# Patient Record
Sex: Female | Born: 1980 | Race: White | Hispanic: No | Marital: Single | Smoking: Current every day smoker
Health system: Southern US, Community
[De-identification: ages and names within clinical notes are randomized; demographics above are authoritative.]

## PROBLEM LIST (undated history)

## (undated) DIAGNOSIS — D649 Anemia, unspecified: Secondary | ICD-10-CM

## (undated) DIAGNOSIS — R569 Unspecified convulsions: Secondary | ICD-10-CM

## (undated) DIAGNOSIS — I69359 Hemiplegia and hemiparesis following cerebral infarction affecting unspecified side: Secondary | ICD-10-CM

## (undated) DIAGNOSIS — T8859XA Other complications of anesthesia, initial encounter: Secondary | ICD-10-CM

## (undated) DIAGNOSIS — N200 Calculus of kidney: Secondary | ICD-10-CM

## (undated) DIAGNOSIS — G43909 Migraine, unspecified, not intractable, without status migrainosus: Secondary | ICD-10-CM

## (undated) DIAGNOSIS — M1990 Unspecified osteoarthritis, unspecified site: Secondary | ICD-10-CM

## (undated) DIAGNOSIS — I514 Myocarditis, unspecified: Secondary | ICD-10-CM

## (undated) DIAGNOSIS — N760 Acute vaginitis: Secondary | ICD-10-CM

## (undated) DIAGNOSIS — B9689 Other specified bacterial agents as the cause of diseases classified elsewhere: Secondary | ICD-10-CM

## (undated) DIAGNOSIS — Z8489 Family history of other specified conditions: Secondary | ICD-10-CM

## (undated) DIAGNOSIS — Z9289 Personal history of other medical treatment: Secondary | ICD-10-CM

## (undated) DIAGNOSIS — F419 Anxiety disorder, unspecified: Secondary | ICD-10-CM

## (undated) DIAGNOSIS — M5430 Sciatica, unspecified side: Secondary | ICD-10-CM

## (undated) DIAGNOSIS — I69398 Other sequelae of cerebral infarction: Secondary | ICD-10-CM

## (undated) DIAGNOSIS — Z8759 Personal history of other complications of pregnancy, childbirth and the puerperium: Secondary | ICD-10-CM

## (undated) DIAGNOSIS — N189 Chronic kidney disease, unspecified: Secondary | ICD-10-CM

## (undated) DIAGNOSIS — T4145XA Adverse effect of unspecified anesthetic, initial encounter: Secondary | ICD-10-CM

## (undated) DIAGNOSIS — A749 Chlamydial infection, unspecified: Secondary | ICD-10-CM

## (undated) DIAGNOSIS — N390 Urinary tract infection, site not specified: Secondary | ICD-10-CM

## (undated) HISTORY — DX: Personal history of other medical treatment: Z92.89

## (undated) HISTORY — PX: OOPHORECTOMY: SHX86

## (undated) HISTORY — PX: SALPINGOOPHORECTOMY: SHX82

## (undated) HISTORY — DX: Unspecified convulsions: R56.9

## (undated) HISTORY — DX: Hemiplegia and hemiparesis following cerebral infarction affecting unspecified side: I69.359

## (undated) HISTORY — DX: Other sequelae of cerebral infarction: I69.398

## (undated) HISTORY — PX: APPENDECTOMY: SHX54

---

## 2005-02-09 ENCOUNTER — Emergency Department (HOSPITAL_COMMUNITY): Admission: EM | Admit: 2005-02-09 | Discharge: 2005-02-09 | Payer: Self-pay

## 2005-07-08 ENCOUNTER — Emergency Department (HOSPITAL_COMMUNITY): Admission: EM | Admit: 2005-07-08 | Discharge: 2005-07-08 | Payer: Self-pay | Admitting: Emergency Medicine

## 2005-07-14 ENCOUNTER — Emergency Department (HOSPITAL_COMMUNITY): Admission: EM | Admit: 2005-07-14 | Discharge: 2005-07-14 | Payer: Self-pay | Admitting: Emergency Medicine

## 2005-09-10 ENCOUNTER — Emergency Department (HOSPITAL_COMMUNITY): Admission: EM | Admit: 2005-09-10 | Discharge: 2005-09-10 | Payer: Self-pay | Admitting: Emergency Medicine

## 2006-01-27 ENCOUNTER — Emergency Department (HOSPITAL_COMMUNITY): Admission: EM | Admit: 2006-01-27 | Discharge: 2006-01-27 | Payer: Self-pay | Admitting: Emergency Medicine

## 2006-02-01 ENCOUNTER — Emergency Department (HOSPITAL_COMMUNITY): Admission: EM | Admit: 2006-02-01 | Discharge: 2006-02-01 | Payer: Self-pay | Admitting: Emergency Medicine

## 2006-03-07 ENCOUNTER — Emergency Department (HOSPITAL_COMMUNITY): Admission: EM | Admit: 2006-03-07 | Discharge: 2006-03-07 | Payer: Self-pay | Admitting: Emergency Medicine

## 2006-03-29 ENCOUNTER — Emergency Department (HOSPITAL_COMMUNITY): Admission: EM | Admit: 2006-03-29 | Discharge: 2006-03-29 | Payer: Self-pay | Admitting: Emergency Medicine

## 2006-04-01 ENCOUNTER — Ambulatory Visit (HOSPITAL_COMMUNITY): Admission: RE | Admit: 2006-04-01 | Discharge: 2006-04-01 | Payer: Self-pay | Admitting: Emergency Medicine

## 2006-04-25 ENCOUNTER — Emergency Department (HOSPITAL_COMMUNITY): Admission: EM | Admit: 2006-04-25 | Discharge: 2006-04-25 | Payer: Self-pay | Admitting: Emergency Medicine

## 2006-05-14 ENCOUNTER — Emergency Department (HOSPITAL_COMMUNITY): Admission: EM | Admit: 2006-05-14 | Discharge: 2006-05-14 | Payer: Self-pay | Admitting: Emergency Medicine

## 2006-05-17 ENCOUNTER — Inpatient Hospital Stay (HOSPITAL_COMMUNITY): Admission: AD | Admit: 2006-05-17 | Discharge: 2006-05-17 | Payer: Self-pay | Admitting: Obstetrics & Gynecology

## 2006-05-29 ENCOUNTER — Emergency Department (HOSPITAL_COMMUNITY): Admission: EM | Admit: 2006-05-29 | Discharge: 2006-05-29 | Payer: Self-pay | Admitting: Emergency Medicine

## 2006-06-09 ENCOUNTER — Emergency Department (HOSPITAL_COMMUNITY): Admission: EM | Admit: 2006-06-09 | Discharge: 2006-06-09 | Payer: Self-pay | Admitting: Emergency Medicine

## 2006-06-13 ENCOUNTER — Ambulatory Visit: Payer: Self-pay | Admitting: Gynecology

## 2006-06-14 ENCOUNTER — Emergency Department (HOSPITAL_COMMUNITY): Admission: EM | Admit: 2006-06-14 | Discharge: 2006-06-14 | Payer: Self-pay | Admitting: Emergency Medicine

## 2006-06-29 ENCOUNTER — Emergency Department (HOSPITAL_COMMUNITY): Admission: EM | Admit: 2006-06-29 | Discharge: 2006-06-29 | Payer: Self-pay | Admitting: Emergency Medicine

## 2006-07-23 ENCOUNTER — Emergency Department (HOSPITAL_COMMUNITY): Admission: EM | Admit: 2006-07-23 | Discharge: 2006-07-23 | Payer: Self-pay | Admitting: Emergency Medicine

## 2006-07-27 ENCOUNTER — Emergency Department (HOSPITAL_COMMUNITY): Admission: EM | Admit: 2006-07-27 | Discharge: 2006-07-27 | Payer: Self-pay | Admitting: Emergency Medicine

## 2006-08-01 ENCOUNTER — Ambulatory Visit: Payer: Self-pay | Admitting: Gynecology

## 2006-08-01 ENCOUNTER — Ambulatory Visit (HOSPITAL_COMMUNITY): Admission: RE | Admit: 2006-08-01 | Discharge: 2006-08-01 | Payer: Self-pay | Admitting: Obstetrics and Gynecology

## 2006-08-07 ENCOUNTER — Inpatient Hospital Stay (HOSPITAL_COMMUNITY): Admission: AD | Admit: 2006-08-07 | Discharge: 2006-08-07 | Payer: Self-pay | Admitting: Gynecology

## 2006-08-09 ENCOUNTER — Emergency Department (HOSPITAL_COMMUNITY): Admission: EM | Admit: 2006-08-09 | Discharge: 2006-08-09 | Payer: Self-pay | Admitting: Emergency Medicine

## 2006-08-14 ENCOUNTER — Inpatient Hospital Stay (HOSPITAL_COMMUNITY): Admission: AD | Admit: 2006-08-14 | Discharge: 2006-08-14 | Payer: Self-pay | Admitting: Obstetrics and Gynecology

## 2006-08-16 ENCOUNTER — Ambulatory Visit: Payer: Self-pay | Admitting: Gynecology

## 2006-08-16 ENCOUNTER — Encounter (INDEPENDENT_AMBULATORY_CARE_PROVIDER_SITE_OTHER): Payer: Self-pay | Admitting: *Deleted

## 2006-08-16 ENCOUNTER — Ambulatory Visit (HOSPITAL_COMMUNITY): Admission: RE | Admit: 2006-08-16 | Discharge: 2006-08-17 | Payer: Self-pay | Admitting: Gynecology

## 2006-08-23 ENCOUNTER — Inpatient Hospital Stay (HOSPITAL_COMMUNITY): Admission: AD | Admit: 2006-08-23 | Discharge: 2006-08-23 | Payer: Self-pay | Admitting: Gynecology

## 2006-08-28 ENCOUNTER — Ambulatory Visit: Payer: Self-pay | Admitting: Gynecology

## 2006-09-06 ENCOUNTER — Emergency Department (HOSPITAL_COMMUNITY): Admission: EM | Admit: 2006-09-06 | Discharge: 2006-09-06 | Payer: Self-pay | Admitting: Emergency Medicine

## 2006-09-20 ENCOUNTER — Inpatient Hospital Stay (HOSPITAL_COMMUNITY): Admission: AD | Admit: 2006-09-20 | Discharge: 2006-09-20 | Payer: Self-pay | Admitting: Gynecology

## 2006-09-25 ENCOUNTER — Ambulatory Visit: Payer: Self-pay | Admitting: Gynecology

## 2006-10-15 ENCOUNTER — Inpatient Hospital Stay (HOSPITAL_COMMUNITY): Admission: AD | Admit: 2006-10-15 | Discharge: 2006-10-15 | Payer: Self-pay | Admitting: Obstetrics and Gynecology

## 2006-10-28 ENCOUNTER — Ambulatory Visit (HOSPITAL_COMMUNITY): Admission: RE | Admit: 2006-10-28 | Discharge: 2006-10-28 | Payer: Self-pay | Admitting: Gynecology

## 2006-11-19 ENCOUNTER — Ambulatory Visit: Payer: Self-pay | Admitting: Nurse Practitioner

## 2006-11-22 ENCOUNTER — Ambulatory Visit: Payer: Self-pay | Admitting: *Deleted

## 2006-12-09 ENCOUNTER — Emergency Department (HOSPITAL_COMMUNITY): Admission: EM | Admit: 2006-12-09 | Discharge: 2006-12-09 | Payer: Self-pay | Admitting: Emergency Medicine

## 2006-12-18 ENCOUNTER — Ambulatory Visit: Payer: Self-pay | Admitting: Gynecology

## 2006-12-26 ENCOUNTER — Ambulatory Visit: Payer: Self-pay | Admitting: Family Medicine

## 2006-12-30 ENCOUNTER — Emergency Department (HOSPITAL_COMMUNITY): Admission: EM | Admit: 2006-12-30 | Discharge: 2006-12-30 | Payer: Self-pay | Admitting: Emergency Medicine

## 2007-01-22 ENCOUNTER — Ambulatory Visit: Payer: Self-pay | Admitting: Internal Medicine

## 2007-01-27 ENCOUNTER — Emergency Department (HOSPITAL_COMMUNITY): Admission: EM | Admit: 2007-01-27 | Discharge: 2007-01-27 | Payer: Self-pay | Admitting: Emergency Medicine

## 2007-02-12 ENCOUNTER — Ambulatory Visit: Payer: Self-pay | Admitting: Internal Medicine

## 2007-03-06 ENCOUNTER — Ambulatory Visit: Payer: Self-pay | Admitting: Family Medicine

## 2007-03-16 ENCOUNTER — Emergency Department (HOSPITAL_COMMUNITY): Admission: EM | Admit: 2007-03-16 | Discharge: 2007-03-16 | Payer: Self-pay | Admitting: Emergency Medicine

## 2007-03-24 ENCOUNTER — Emergency Department (HOSPITAL_COMMUNITY): Admission: EM | Admit: 2007-03-24 | Discharge: 2007-03-24 | Payer: Self-pay | Admitting: Emergency Medicine

## 2007-04-01 ENCOUNTER — Ambulatory Visit: Payer: Self-pay | Admitting: Internal Medicine

## 2007-04-13 ENCOUNTER — Inpatient Hospital Stay (HOSPITAL_COMMUNITY): Admission: AD | Admit: 2007-04-13 | Discharge: 2007-04-13 | Payer: Self-pay | Admitting: Obstetrics & Gynecology

## 2007-04-23 ENCOUNTER — Ambulatory Visit: Payer: Self-pay | Admitting: Internal Medicine

## 2007-04-23 ENCOUNTER — Ambulatory Visit: Payer: Self-pay | Admitting: Gynecology

## 2007-05-14 ENCOUNTER — Encounter: Payer: Self-pay | Admitting: Obstetrics and Gynecology

## 2007-05-14 ENCOUNTER — Ambulatory Visit: Payer: Self-pay | Admitting: Obstetrics & Gynecology

## 2007-05-23 ENCOUNTER — Emergency Department (HOSPITAL_COMMUNITY): Admission: EM | Admit: 2007-05-23 | Discharge: 2007-05-23 | Payer: Self-pay | Admitting: Emergency Medicine

## 2007-05-27 ENCOUNTER — Ambulatory Visit (HOSPITAL_COMMUNITY): Admission: RE | Admit: 2007-05-27 | Discharge: 2007-05-27 | Payer: Self-pay | Admitting: Obstetrics & Gynecology

## 2007-06-07 ENCOUNTER — Emergency Department (HOSPITAL_COMMUNITY): Admission: EM | Admit: 2007-06-07 | Discharge: 2007-06-07 | Payer: Self-pay | Admitting: Emergency Medicine

## 2007-06-11 ENCOUNTER — Ambulatory Visit: Payer: Self-pay | Admitting: *Deleted

## 2007-06-26 ENCOUNTER — Emergency Department (HOSPITAL_COMMUNITY): Admission: EM | Admit: 2007-06-26 | Discharge: 2007-06-26 | Payer: Self-pay | Admitting: Emergency Medicine

## 2007-06-30 ENCOUNTER — Ambulatory Visit: Payer: Self-pay | Admitting: Obstetrics & Gynecology

## 2007-07-11 ENCOUNTER — Ambulatory Visit (HOSPITAL_COMMUNITY): Admission: RE | Admit: 2007-07-11 | Discharge: 2007-07-11 | Payer: Self-pay | Admitting: Obstetrics & Gynecology

## 2007-07-24 ENCOUNTER — Ambulatory Visit: Payer: Self-pay | Admitting: Obstetrics & Gynecology

## 2007-08-05 ENCOUNTER — Encounter: Admission: RE | Admit: 2007-08-05 | Discharge: 2007-09-16 | Payer: Self-pay | Admitting: Obstetrics & Gynecology

## 2007-08-07 ENCOUNTER — Emergency Department (HOSPITAL_COMMUNITY): Admission: EM | Admit: 2007-08-07 | Discharge: 2007-08-07 | Payer: Self-pay | Admitting: Emergency Medicine

## 2007-08-08 ENCOUNTER — Ambulatory Visit (HOSPITAL_COMMUNITY): Admission: RE | Admit: 2007-08-08 | Discharge: 2007-08-08 | Payer: Self-pay | Admitting: Obstetrics & Gynecology

## 2007-08-15 ENCOUNTER — Emergency Department (HOSPITAL_COMMUNITY): Admission: EM | Admit: 2007-08-15 | Discharge: 2007-08-15 | Payer: Self-pay | Admitting: Emergency Medicine

## 2007-08-26 ENCOUNTER — Emergency Department (HOSPITAL_COMMUNITY): Admission: EM | Admit: 2007-08-26 | Discharge: 2007-08-26 | Payer: Self-pay | Admitting: Emergency Medicine

## 2007-08-28 ENCOUNTER — Ambulatory Visit: Payer: Self-pay | Admitting: Obstetrics & Gynecology

## 2007-09-05 ENCOUNTER — Ambulatory Visit (HOSPITAL_COMMUNITY): Admission: RE | Admit: 2007-09-05 | Discharge: 2007-09-05 | Payer: Self-pay | Admitting: Obstetrics & Gynecology

## 2007-09-25 ENCOUNTER — Ambulatory Visit: Payer: Self-pay | Admitting: Family Medicine

## 2007-10-09 ENCOUNTER — Ambulatory Visit: Payer: Self-pay | Admitting: Obstetrics & Gynecology

## 2007-10-23 ENCOUNTER — Ambulatory Visit: Payer: Self-pay | Admitting: Obstetrics & Gynecology

## 2007-10-23 ENCOUNTER — Encounter: Payer: Self-pay | Admitting: Obstetrics and Gynecology

## 2007-10-23 ENCOUNTER — Inpatient Hospital Stay (HOSPITAL_COMMUNITY): Admission: AD | Admit: 2007-10-23 | Discharge: 2007-10-25 | Payer: Self-pay | Admitting: Obstetrics and Gynecology

## 2007-10-23 ENCOUNTER — Ambulatory Visit: Payer: Self-pay | Admitting: Family

## 2007-10-27 ENCOUNTER — Inpatient Hospital Stay (HOSPITAL_COMMUNITY): Admission: AD | Admit: 2007-10-27 | Discharge: 2007-10-27 | Payer: Self-pay | Admitting: Obstetrics & Gynecology

## 2007-10-30 ENCOUNTER — Emergency Department (HOSPITAL_COMMUNITY): Admission: EM | Admit: 2007-10-30 | Discharge: 2007-10-30 | Payer: Self-pay | Admitting: Emergency Medicine

## 2007-10-31 ENCOUNTER — Ambulatory Visit: Payer: Self-pay | Admitting: *Deleted

## 2007-11-07 ENCOUNTER — Ambulatory Visit: Payer: Self-pay | Admitting: Gynecology

## 2007-12-06 ENCOUNTER — Emergency Department (HOSPITAL_COMMUNITY): Admission: EM | Admit: 2007-12-06 | Discharge: 2007-12-06 | Payer: Self-pay | Admitting: Emergency Medicine

## 2007-12-19 ENCOUNTER — Ambulatory Visit: Payer: Self-pay | Admitting: Internal Medicine

## 2008-01-04 ENCOUNTER — Emergency Department (HOSPITAL_COMMUNITY): Admission: EM | Admit: 2008-01-04 | Discharge: 2008-01-04 | Payer: Self-pay | Admitting: Emergency Medicine

## 2008-01-15 ENCOUNTER — Ambulatory Visit: Payer: Self-pay | Admitting: Internal Medicine

## 2008-02-16 ENCOUNTER — Encounter: Admission: RE | Admit: 2008-02-16 | Discharge: 2008-04-23 | Payer: Self-pay | Admitting: Family Medicine

## 2008-03-03 ENCOUNTER — Emergency Department (HOSPITAL_COMMUNITY): Admission: EM | Admit: 2008-03-03 | Discharge: 2008-03-03 | Payer: Self-pay | Admitting: Emergency Medicine

## 2008-03-22 ENCOUNTER — Ambulatory Visit: Payer: Self-pay | Admitting: Internal Medicine

## 2008-03-22 ENCOUNTER — Encounter (INDEPENDENT_AMBULATORY_CARE_PROVIDER_SITE_OTHER): Payer: Self-pay | Admitting: Family Medicine

## 2008-03-22 LAB — CONVERTED CEMR LAB
ALT: 9 units/L (ref 0–35)
AST: 11 units/L (ref 0–37)
Albumin: 4.7 g/dL (ref 3.5–5.2)
CO2: 25 meq/L (ref 19–32)
Calcium: 9.8 mg/dL (ref 8.4–10.5)
Chloride: 106 meq/L (ref 96–112)
Creatinine, Ser: 0.78 mg/dL (ref 0.40–1.20)
Potassium: 4.4 meq/L (ref 3.5–5.3)
Preg, Serum: NEGATIVE
Total Protein: 7.1 g/dL (ref 6.0–8.3)

## 2008-04-27 ENCOUNTER — Ambulatory Visit: Payer: Self-pay | Admitting: Internal Medicine

## 2008-06-02 ENCOUNTER — Ambulatory Visit: Payer: Self-pay | Admitting: Obstetrics & Gynecology

## 2008-06-02 ENCOUNTER — Encounter: Payer: Self-pay | Admitting: Obstetrics & Gynecology

## 2008-09-20 ENCOUNTER — Emergency Department (HOSPITAL_COMMUNITY): Admission: EM | Admit: 2008-09-20 | Discharge: 2008-09-20 | Payer: Self-pay | Admitting: Emergency Medicine

## 2008-12-24 ENCOUNTER — Emergency Department (HOSPITAL_COMMUNITY): Admission: EM | Admit: 2008-12-24 | Discharge: 2008-12-24 | Payer: Self-pay | Admitting: Emergency Medicine

## 2009-01-27 ENCOUNTER — Ambulatory Visit: Payer: Self-pay | Admitting: Obstetrics and Gynecology

## 2009-01-27 ENCOUNTER — Encounter: Payer: Self-pay | Admitting: Obstetrics & Gynecology

## 2009-07-25 ENCOUNTER — Emergency Department (HOSPITAL_COMMUNITY): Admission: EM | Admit: 2009-07-25 | Discharge: 2009-07-25 | Payer: Self-pay | Admitting: Emergency Medicine

## 2009-07-27 ENCOUNTER — Ambulatory Visit: Payer: Self-pay | Admitting: Obstetrics and Gynecology

## 2009-07-27 ENCOUNTER — Other Ambulatory Visit: Admission: RE | Admit: 2009-07-27 | Discharge: 2009-07-27 | Payer: Self-pay | Admitting: Psychology

## 2009-08-10 ENCOUNTER — Ambulatory Visit: Payer: Self-pay | Admitting: Obstetrics & Gynecology

## 2009-08-18 ENCOUNTER — Emergency Department (HOSPITAL_COMMUNITY): Admission: EM | Admit: 2009-08-18 | Discharge: 2009-08-18 | Payer: Self-pay | Admitting: Emergency Medicine

## 2009-09-19 ENCOUNTER — Ambulatory Visit: Payer: Self-pay | Admitting: Internal Medicine

## 2009-11-17 ENCOUNTER — Ambulatory Visit: Payer: Self-pay | Admitting: Internal Medicine

## 2009-12-03 ENCOUNTER — Emergency Department (HOSPITAL_COMMUNITY): Admission: EM | Admit: 2009-12-03 | Discharge: 2009-12-03 | Payer: Self-pay | Admitting: Emergency Medicine

## 2009-12-15 ENCOUNTER — Ambulatory Visit: Payer: Self-pay | Admitting: Internal Medicine

## 2010-01-18 ENCOUNTER — Ambulatory Visit: Payer: Self-pay | Admitting: Internal Medicine

## 2010-01-23 ENCOUNTER — Emergency Department (HOSPITAL_COMMUNITY): Admission: EM | Admit: 2010-01-23 | Discharge: 2010-01-23 | Payer: Self-pay | Admitting: Emergency Medicine

## 2010-01-25 ENCOUNTER — Ambulatory Visit: Payer: Self-pay | Admitting: Internal Medicine

## 2010-02-08 ENCOUNTER — Emergency Department (HOSPITAL_COMMUNITY): Admission: EM | Admit: 2010-02-08 | Discharge: 2010-02-08 | Payer: Self-pay | Admitting: Emergency Medicine

## 2010-02-22 ENCOUNTER — Ambulatory Visit: Payer: Self-pay | Admitting: Internal Medicine

## 2010-07-30 ENCOUNTER — Encounter: Payer: Self-pay | Admitting: *Deleted

## 2010-07-31 ENCOUNTER — Encounter: Payer: Self-pay | Admitting: Emergency Medicine

## 2010-09-27 LAB — POCT I-STAT, CHEM 8
BUN: 11 mg/dL (ref 6–23)
Creatinine, Ser: 0.8 mg/dL (ref 0.4–1.2)
Hemoglobin: 13.6 g/dL (ref 12.0–15.0)
Potassium: 3.9 mEq/L (ref 3.5–5.1)
Sodium: 140 mEq/L (ref 135–145)
TCO2: 27 mmol/L (ref 0–100)

## 2010-09-27 LAB — URINALYSIS, ROUTINE W REFLEX MICROSCOPIC
Glucose, UA: NEGATIVE mg/dL
Hgb urine dipstick: NEGATIVE
Leukocytes, UA: NEGATIVE
Protein, ur: NEGATIVE mg/dL
Specific Gravity, Urine: 1.022 (ref 1.005–1.030)
pH: 6.5 (ref 5.0–8.0)

## 2010-09-27 LAB — URINE MICROSCOPIC-ADD ON

## 2010-09-27 LAB — POCT PREGNANCY, URINE

## 2010-09-27 LAB — WET PREP, GENITAL
WBC, Wet Prep HPF POC: NONE SEEN
Yeast Wet Prep HPF POC: NONE SEEN

## 2010-10-15 LAB — POCT URINALYSIS DIP (DEVICE)
Bilirubin Urine: NEGATIVE
Glucose, UA: NEGATIVE mg/dL
Nitrite: NEGATIVE
Specific Gravity, Urine: 1.02 (ref 1.005–1.030)
Urobilinogen, UA: 0.2 mg/dL (ref 0.0–1.0)

## 2010-10-16 ENCOUNTER — Emergency Department (HOSPITAL_COMMUNITY)
Admission: EM | Admit: 2010-10-16 | Discharge: 2010-10-16 | Disposition: A | Discharge status: Home / Self Care | Payer: Medicaid Other | Attending: Emergency Medicine | Admitting: Emergency Medicine

## 2010-10-16 DIAGNOSIS — R10816 Epigastric abdominal tenderness: Secondary | ICD-10-CM | POA: Insufficient documentation

## 2010-10-16 DIAGNOSIS — N390 Urinary tract infection, site not specified: Secondary | ICD-10-CM | POA: Insufficient documentation

## 2010-10-16 DIAGNOSIS — R109 Unspecified abdominal pain: Secondary | ICD-10-CM | POA: Insufficient documentation

## 2010-10-16 DIAGNOSIS — R112 Nausea with vomiting, unspecified: Secondary | ICD-10-CM | POA: Insufficient documentation

## 2010-10-16 LAB — URINALYSIS, ROUTINE W REFLEX MICROSCOPIC
Ketones, ur: >80 mg/dL — AB
Nitrite: NEGATIVE
Specific Gravity, Urine: 1.038 — ABNORMAL HIGH (ref 1.005–1.030)
pH: 6 (ref 5.0–8.0)

## 2010-10-16 LAB — COMPREHENSIVE METABOLIC PANEL
Alkaline Phosphatase: 55 U/L (ref 39–117)
BUN: 12 mg/dL (ref 6–23)
Creatinine, Ser: 0.76 mg/dL (ref 0.4–1.2)
Glucose, Bld: 80 mg/dL (ref 70–99)
Potassium: 3.2 mEq/L — ABNORMAL LOW (ref 3.5–5.1)
Total Protein: 6.9 g/dL (ref 6.0–8.3)

## 2010-10-16 LAB — URINE MICROSCOPIC-ADD ON

## 2010-10-16 LAB — CBC
HCT: 39.5 % (ref 36.0–46.0)
Hemoglobin: 12.8 g/dL (ref 12.0–15.0)
RBC: 4.23 MIL/uL (ref 3.87–5.11)

## 2010-10-16 LAB — DIFFERENTIAL
Basophils Absolute: 0 10*3/uL (ref 0.0–0.1)
Basophils Relative: 0 % (ref 0–1)
Lymphocytes Relative: 37 % (ref 12–46)
Neutro Abs: 4.2 10*3/uL (ref 1.7–7.7)
Neutrophils Relative %: 52 % (ref 43–77)

## 2010-10-16 LAB — POCT PREGNANCY, URINE

## 2010-10-16 LAB — LIPASE, BLOOD: Lipase: 33 U/L (ref 11–59)

## 2010-10-19 ENCOUNTER — Emergency Department (HOSPITAL_COMMUNITY): Payer: Medicaid Other

## 2010-10-19 ENCOUNTER — Emergency Department (HOSPITAL_COMMUNITY)
Admission: EM | Admit: 2010-10-19 | Discharge: 2010-10-19 | Disposition: A | Discharge status: Home / Self Care | Payer: Medicaid Other | Attending: Emergency Medicine | Admitting: Emergency Medicine

## 2010-10-19 DIAGNOSIS — IMO0002 Reserved for concepts with insufficient information to code with codable children: Secondary | ICD-10-CM | POA: Insufficient documentation

## 2010-10-19 DIAGNOSIS — R1013 Epigastric pain: Secondary | ICD-10-CM | POA: Insufficient documentation

## 2010-10-19 DIAGNOSIS — R112 Nausea with vomiting, unspecified: Secondary | ICD-10-CM | POA: Insufficient documentation

## 2010-10-19 DIAGNOSIS — R197 Diarrhea, unspecified: Secondary | ICD-10-CM | POA: Insufficient documentation

## 2010-10-19 DIAGNOSIS — Z8742 Personal history of other diseases of the female genital tract: Secondary | ICD-10-CM | POA: Insufficient documentation

## 2010-10-19 DIAGNOSIS — M1990 Unspecified osteoarthritis, unspecified site: Secondary | ICD-10-CM | POA: Insufficient documentation

## 2010-10-19 LAB — COMPREHENSIVE METABOLIC PANEL WITH GFR
ALT: 12 U/L (ref 0–35)
AST: 12 U/L (ref 0–37)
Alkaline Phosphatase: 56 U/L (ref 39–117)
CO2: 26 meq/L (ref 19–32)
Calcium: 9.5 mg/dL (ref 8.4–10.5)
GFR calc Af Amer: >60 mL/min (ref >60)
GFR calc non Af Amer: >60 mL/min (ref >60)
Glucose, Bld: 89 mg/dL (ref 70–99)
Potassium: 3 meq/L — ABNORMAL LOW (ref 3.5–5.1)
Sodium: 140 meq/L (ref 135–145)

## 2010-10-19 LAB — COMPREHENSIVE METABOLIC PANEL
Albumin: 4.1 g/dL (ref 3.5–5.2)
BUN: 6 mg/dL (ref 6–23)
Chloride: 105 mEq/L (ref 96–112)
Creatinine, Ser: 0.72 mg/dL (ref 0.4–1.2)
Total Bilirubin: 0.7 mg/dL (ref 0.3–1.2)
Total Protein: 6.9 g/dL (ref 6.0–8.3)

## 2010-10-19 LAB — URINALYSIS, ROUTINE W REFLEX MICROSCOPIC
Bilirubin Urine: NEGATIVE
Glucose, UA: NEGATIVE mg/dL
Ketones, ur: 40 mg/dL — AB
Nitrite: NEGATIVE
Protein, ur: NEGATIVE mg/dL
Specific Gravity, Urine: 1.022 (ref 1.005–1.030)
Urobilinogen, UA: 0.2 mg/dL (ref 0.0–1.0)
pH: 6.5 (ref 5.0–8.0)

## 2010-10-19 LAB — URINE MICROSCOPIC-ADD ON

## 2010-10-19 LAB — DIFFERENTIAL
Basophils Absolute: 0 10*3/uL (ref 0.0–0.1)
Basophils Relative: 0 % (ref 0–1)
Eosinophils Absolute: 0 K/uL (ref 0.0–0.7)
Eosinophils Relative: 0 % (ref 0–5)
Lymphocytes Relative: 30 % (ref 12–46)
Lymphs Abs: 2.8 K/uL (ref 0.7–4.0)
Monocytes Absolute: 1 K/uL (ref 0.1–1.0)
Monocytes Relative: 11 % (ref 3–12)
Neutro Abs: 5.5 10*3/uL (ref 1.7–7.7)
Neutrophils Relative %: 59 % (ref 43–77)

## 2010-10-19 LAB — CBC
HCT: 42.1 % (ref 36.0–46.0)
Hemoglobin: 13.9 g/dL (ref 12.0–15.0)
MCH: 29.7 pg (ref 26.0–34.0)
MCHC: 33 g/dL (ref 30.0–36.0)
MCV: 90 fL (ref 78.0–100.0)
Platelets: 210 K/uL (ref 150–400)
RBC: 4.68 MIL/uL (ref 3.87–5.11)
RDW: 12.3 % (ref 11.5–15.5)
WBC: 9.3 10*3/uL (ref 4.0–10.5)

## 2010-10-19 LAB — POCT PREGNANCY, URINE: Preg Test, Ur: NEGATIVE

## 2010-10-19 LAB — LIPASE, BLOOD: Lipase: 29 U/L (ref 11–59)

## 2010-11-21 NOTE — Group Therapy Note (Signed)
NAMEDANIKA, KLUENDER NO.:  [id number]   MEDICAL RECORD NO.:  [id number]          PATIENT TYPE:  WOC   LOCATION:  [facility]                   FACILITY:  [facility]   PHYSICIAN:  Scheryl Darter, MD       DATE OF BIRTH:  24-Aug-1980   DATE OF SERVICE:  01/27/2009                                  CLINIC NOTE   HISTORY OF PRESENT ILLNESS:  The patient is a 30 year old white female  gravida 1, para 1 with a history of endometriosis.  The patient had a  laparoscopy and right salpingo-oophorectomy and appendectomy performed  by Dr. Mia Creek.  She had been placed on Tri-Sprintec when she was seen  on June 02, 2008.  She had breakthrough bleeding after she was on  pills for about two and half months and she would stop them and then  restart them if she has breakthrough bleeding again.  She would like to  be on contraceptives.  She now reports 2 weeks of fairly severe constant  lower abdominal pain.  No urinary frequency or dysuria.  She started  having bleeding today.  She had noticed slight discharge.  No fever.   ALLERGIES:  She has no known drug allergies.  She has a LATEX  sensitivity.   MEDICATIONS:  1. Albuterol inhaler.  2. Adderall 1-1/2 tablets a day.  3. Ibuprofen p.r.n.   PHYSICAL EXAMINATION:  GENERAL:  The patient is in no acute distress.  VITAL SIGNS:  Weight 169 pounds, height 5 foot 8 inches, BP 120/72,  temperature 98.3.  ABDOMEN:  Nondistended, soft, nontender, no mass.  PELVIC:  External genitalia, vagina, and cervix notable for moderate  menstrual flow.  She has cervical motion tenderness.  Uterus normal  size.  Tenderness is worse on the right than the left with no masses.  Gonorrhea and chlamydia probes were obtained.   IMPRESSION:  1. Possible pelvic inflammatory disease.  2. History of endometriosis.   PLAN:  Rocephin 250 mg IM now and doxycycline 100 mg p.o. b.i.d. for 14  days.  Gave prescription for Darvocet N-100 one p.o. q.4-6 h p.r.n.  pain  10 tablets.  She was instructed to return in 2 weeks.  I have discussed  contraceptives method contraceptive methods including Implanon, which  seems to interest her.      Scheryl Darter, MD     JA/MEDQ  D:  01/27/2009  T:  01/28/2009  Job:  [id number]

## 2010-11-21 NOTE — Group Therapy Note (Signed)
NAMEFARRAH, Cassandra Wall NO.:  [id number]   MEDICAL RECORD NO.:  [id number]          PATIENT TYPE:  WOC   LOCATION:  [facility]                   FACILITY:  [facility]   PHYSICIAN:  Karlton Lemon, MD      DATE OF BIRTH:  1980/07/25   DATE OF SERVICE:  10/31/2007                                  CLINIC NOTE   CHIEF COMPLAINT:  White patches in her throat, suspicious for thrush.   HISTORY OF PRESENT ILLNESS:  This is a 30 year old gravida 1, para 1 who  has recently delivered at 32 weeks 5 days on October 23, 2007.  She states  that she noticed some white patches in the back of her throat and, while  visiting her child in the NICU, the nurses there suggested that she  followup with a physician for this.  She notes no difficulty swallowing  or pain with swallowing.  She has no history of immunocompromise status,  other than her current pregnancy.  She has no history of thrush, HIV, or  other immunocompromised state.  She has no other complaints today and is  otherwise doing very well.   PAST MEDICAL HISTORY:  Bipolar disorder.  She is a smoker.  Asthma  currently controlled with only albuterol.  She is not on any inhaled  glucocorticoid for her asthma.   OBSTETRIC HISTORY:  The patient recently delivered a viable infant at 32  weeks 5 days.   MEDICATIONS:  1. Iron.  2. Flexeril.  3. Prenatal vitamin.  4. Colace.  5. Ibuprofen.   ALLERGIES:  LATEX only.  No drug allergies.   PHYSICAL EXAMINATION:  GENERAL:  This is a well-appearing female in no  acute distress.  VITAL SIGNS:  Temperature 97.6, pulse 89, blood pressure 137/91,  respiratory rate is 18, weight 166.7 pounds.  HEAD, EARS, EYES, NOSE, AND THROAT:  Normocephalic, atraumatic.  On  inspection of the mouth, there are multiple white plaques on the soft  palate, numbering about 5 to 6.  There is no bleeding.  There are no  white patches on the buccal mucosa or tongue.   ASSESSMENT AND PLAN:  This is a  30 year old gravida 1, para 1 postpartum  day #8 who presents with oral candidiasis on examination.  She has a  recent HIV performed on May 20 that was nonreactive.  She did not have  any gestational diabetes.  She is currently not on any inhaled steroids  for her asthma and has no other risk factors for thrush, other than  recent mild immunocompromise from pregnancy.  We will perform no new lab  tests today, no scrapings are necessary at this time.  Will start her on  nystatin swish and swallow 100,000 units per mL, 5 mL by mouth swish and  swallow 4 times a day.  She is to follow in 1 week to assess her  response to this therapy.  She has  been counseled that, if this clears with current treatment and has any  recurrence, she needs to follow with physician for further evaluation.           ______________________________  Karlton Lemon,  MD     NS/MEDQ  D:  10/31/2007  T:  10/31/2007  Job:  [id number]

## 2010-11-21 NOTE — Group Therapy Note (Signed)
[name], Wall NO.:  [id number]   MEDICAL RECORD NO.:  [id number]          PATIENT TYPE:  WOC   LOCATION:  [facility]                   FACILITY:  [facility]   PHYSICIAN:  Ginger Carne, MD DATE OF BIRTH:  1980-09-08   DATE OF SERVICE:  11/07/2007                                  CLINIC NOTE   [name] returns today for follow-up of an office visit on October 31, 2007. At that time, she had thrush and was treated with Nystatin swish  and swallow 100,000 units per mL, 5 mL by mouth 4 times daily. The  patient states that she feels much better and has no aftertaste in her  mouth.  She does have symptoms of what appears to be lower pelvic pain  and frequency.  Urinalysis demonstrates a small amount of leukocytes  with negative nitrites.   PHYSICAL EXAM:  No evidence of thrush present in the back of the throat  with minimal presence on the tongue.   PLAN:  The patient was asked to continue her swish and swallow and was  prescribed Bactrim double strength one twice a day for 3 days.  She was  asked to return on a p.r.n. basis.           ______________________________  Ginger Carne, MD     SHB/MEDQ  D:  11/07/2007  T:  11/07/2007  Job:  [id number]

## 2010-11-21 NOTE — Group Therapy Note (Signed)
Cassandra Wall, Cassandra Wall NO.:  [id number]   MEDICAL RECORD NO.:  [id number]          PATIENT TYPE:  WOC   LOCATION:  [facility]                   FACILITY:  [facility]   PHYSICIAN:  Dorthula Perfect, MD     DATE OF BIRTH:  [birth year]   DATE OF SERVICE:                                  CLINIC NOTE   A 30 year old white female gravida 1, para 1 presents with one in eighth  pregnancy test and having some slight pain in her left side.   The patient delivered 8 months ago.  She was on Depo-Provera, but last  received at the end of July.  Last Tuesday, she had sexual intercourse  and the condom broke.  The following day, a week ago, she took the  morning after pill.   About 2-1/2 years ago, it sounds like she had diagnostic laparoscopy by  Dr. Mia Creek, who stated that she had severe endometriosis.  At that  time, he did a right salpingo-oophorectomy and appendectomy.  The  patient is not interested in having any other children, and if something  happens to this present child, she would then adopt.  She is looking at  the possibility of hysterectomy on down the road if her endometriosis  symptoms return.  This apparently was what she had discussed in the past  with Dr. Mia Creek.   The hurting in the left lower quadrant is just mild.   PHYSICAL EXAMINATION:  VITAL SIGNS:  Height 5 feet 8 inches, weight 171,  and blood pressure 134/86.  ABDOMEN:  Soft and nontender.  No masses are felt.  No direct or rebound  tenderness is present.  PELVIS:  External genitalia, BUS and Skene glands are normal.  Vaginal  vault epithelialized as was the cervix, which is normal.  Uterus is in  the midline, normal size and shape, and it is nontender.  Both adnexal  areas are normal without tenderness.   IMPRESSION:  1. Amenorrhea.  2. History of endometriosis.   DISPOSITION:  1. Discussed with the patient whether she would want to have a urine      pregnancy test.  Realizing that if all was  negative, we need to be      repeated it in again about 3 weeks for complete accuracy.  She      elects to have a qualitative serum pregnancy test performed.      Hopefully, the office staff will call her with the result on      Monday, so she does not have to come back in.  This was chosen for      Monday, because for the next 2 days, the clinic is closed because      of Thanksgiving.  2. She was given prescription for Tri-Sprintec to take through [facility] at $9 a month, as she can afford that.  She understands that      if lot of the old endometriosis symptoms return, then a      hysterectomy maybe in her future.           ______________________________  Dorthula Perfect, MD  ER/MEDQ  D:  06/02/2008  T:  06/03/2008  Job:  [id number]

## 2010-11-24 NOTE — Discharge Summary (Signed)
NAMESHAKIMA, Cassandra Wall           ACCOUNT NO.:  [id number]   MEDICAL RECORD NO.:  [id number]          PATIENT TYPE:  OIB   LOCATION:  9304                          [facility]   PHYSICIAN:  Ginger Carne, MD  DATE OF BIRTH:  Feb 16, 1981   DATE OF ADMISSION:  08/16/2006  DATE OF DISCHARGE:  08/17/2006                               DISCHARGE SUMMARY   REASON FOR ADMISSION:  Chronic pelvic pain.   IN-HOSPITAL PROCEDURES:  Laparoscopic appendectomy, laparoscopic right  salpingo-oophorectomy, and excision of endometriosis with lysis of  pelvic peritoneal adhesions.   FINAL DIAGNOSIS:  Stage III endometriosis.   HOSPITAL COURSE:  30 year old Caucasian nulligravida female who  presented with a long term several year history of worsening pelvic  pain.  The patient, preoperatively, had an appropriate workup including  ultrasound history and physical, and was determined to have high  probability of endometriosis.  She was taken to the on operating room on  August 16, 2006, with confirmation of stage III endometriosis with a  right ovarian endometrioma.  The aforementioned surgeries were  performed.  Postoperatively, the patient was kept overnight for pain  management control per the usual collaboration with anesthesia.  The  patient had been on high doses of narcotics preoperatively for pain  management and it was deemed appropriate to monitor overnight for  adequate pain relief.   On discharge, she was afebrile, voiding well, incisions were dry.  Abdomen soft.  Calves without tenderness.  The lungs were clear.  She  was discharged with routine instructions including contacting the office  for temperature elevation above 100.4 degrees Fahrenheit, increasing  abdominal or incisional pain, GI or GU complaints, or any other  concerns.  The patient was prescribed Dilaudid 1 mg, 1-2 every 4-6 hours  as needed for pain.  She was instructed to return in 2-3 weeks to  discuss further  management of endometriosis including Depo-Lupron and  Norethindrone acetate.  All questions were answered to the satisfaction  of said patient and the patient verbalized said understanding.      Ginger Carne, MD  Electronically Signed     SHB/MEDQ  D:  08/17/2006  T:  08/17/2006  Job:  [id number]

## 2010-11-24 NOTE — H&P (Signed)
Cassandra Wall, DUDGEON NO.:  [id number]   MEDICAL RECORD NO.:  [id number]         PATIENT TYPE:  WOIB   LOCATION:                                [facility]   PHYSICIAN:  Ginger Carne, MD  DATE OF BIRTH:  April 17, 1981   DATE OF ADMISSION:  08/16/2006  DATE OF DISCHARGE:                              HISTORY & PHYSICAL   REASON FOR HOSPITALIZATION:  Chronic pelvic pain.   PROPOSED PROCEDURE:  Operative laparoscopy and excision of right and  possible bilateral endometriomas.   HISTORY OF PRESENT ILLNESS:  This patient is a 30 year old gravida 0,  para 0 female who has had a longstanding history over the past 2-3 years  of worsening dysmenorrhea, dyspareunia, and intermenstrual cramping and  pain.  The patient has been followed with serial sonography since  September, 2007.  She demonstrates bilateral ovarian enlargement with  cystic masses, possibly consistent with endometriomas.  In addition, her  scan demonstrates a bicornuate unicollis uterus.  Patient states that  the discomfort has worsened over time but has not had definitive  diagnosis for said pain.   OB/GYN HISTORY:  She is a nulligravida and uses condoms for  contraception.   MEDICAL HISTORY:  Positive for asthma.   PAST SURGICAL HISTORY:  None.   ALLERGIES:  CIPRO.   CURRENT MEDICATIONS:  Xanax 2 mg nightly.   REVIEW OF SYSTEMS:  A 10-point comprehensive review of systems within  normal limits.   SOCIAL HISTORY:  Patient smokes less than a half pack of cigarettes  daily.  Denies alcohol or illicit drug abuse.   FAMILY HISTORY:  Patient's father has had a myocardial infarction and  also has hypertension.  No first-degree relatives with breast, colon,  ovarian, or uterine carcinoma.   PHYSICAL EXAMINATION:  VITAL SIGNS:  Blood pressure 103/64.  Weight 158  pounds.  Height 5 feet 7 inches.  Pulse 86 and regular.  HEENT:  Grossly normal.  BREASTS:  Without mass, discharge, thickenings,  or tenderness.  CHEST:  Clear to auscultation and percussion.  CARDIOVASCULAR:  Without murmurs or enlargements.  Regular rate and  rhythm.  EXTREMITIES:  Within normal limits.  SKIN:  Within normal limits.  LYMPHATICS:  Within normal limits.  NEUROLOGIC:  Within normal limits.  MUSCULOSKELETAL:  Within normal limits.  ABDOMEN:  Soft without gross hepatosplenomegaly.  PELVIC:  Normal Pap smear.  External genitalia, vulva, and vagina  normal.  Cervix tender on motion.  Both adnexa palpable.  Slight  enlargement, tender.  Uterus normal size and tender.   IMPRESSION:  Chronic pelvic pain with dysmenorrhea and dyspareunia.   PLAN:  Patient will undergo an operative laparoscopy with possible  incision of right and/or bilateral endometriomas at present.  Also  possible appendectomy, if indicated.  Patient understands the nature of  said procedure is to definitively diagnosis endometriosis and/or chronic  pelvic inflammatory disease.  The patient understands that she will  require more than likely postoperative medical management as well.  This  will be dependent on the nature of said findings.  Risks, including  possible injury to ureter, bowel, and bladder,  and possible conversion  to an open procedure were discussed and understood by said patient.      Ginger Carne, MD  Electronically Signed     SHB/MEDQ  D:  08/14/2006  T:  08/14/2006  Job:  [id number]

## 2010-11-24 NOTE — Op Note (Signed)
Cassandra Wall, Cassandra Wall           ACCOUNT NO.:  [id number]   MEDICAL RECORD NO.:  [id number]          PATIENT TYPE:  OIB   LOCATION:  9304                          [facility]   PHYSICIAN:  Ginger Carne, MD  DATE OF BIRTH:  1980-11-01   DATE OF PROCEDURE:  08/16/2006  DATE OF DISCHARGE:                               OPERATIVE REPORT   PREOPERATIVE DIAGNOSIS:  Chronic pelvic pain.   POSTOPERATIVE DIAGNOSIS:  Chronic pelvic pain, stage III endometriosis,  and right ovarian endometrioma.   PROCEDURE:  Laparoscopic appendectomy, right salpingo-oophorectomy, and  excision of peritoneal endometriosis.   SURGEON:  Ginger Carne, M.D.   ASSISTANT:  Lesly Dukes, M.D.   ESTIMATED BLOOD LOSS:  50 mL.   COMPLICATIONS:  None immediate.   SPECIMEN:  Peritoneal endometriosis, right tube and ovary, and appendix  to pathology.   OPERATIVE FINDINGS:  Laparoscopic evaluation revealed a bicornuate  uterus with evidence of red and clear punctate lesions on the anterior  and posterior surfaces of the uterus.  There were deep lesions on the  anterior peritoneal reflection as well as on the left posterior broad  ligament. The right tube and ovary were significantly adhesed with  evidence of a 2-3 cm endometrioma. The left ovary had surface  endometriosis, but otherwise, was free of adhesive disease.  The  appendix was adherent in the pelvis.  The upper abdomen was normal.  The  large and small bowel grossly normal.  No evidence of femoral, inguinal  or obturator herniae.  Both ureters were identified throughout their  pelvic course.  It was deemed in the best interest of the patient to  remove the right adnexa due to the relatively normal appearing left side  and to preserve her long term outlook for fertility.   OPERATIVE PROCEDURE:  The patient was prepped and draped in the usual  fashion and placed in the lithotomy position.  Betadine solution was  used for antiseptic.   The patient was catheterized prior to the  procedure.  After adequate general anesthesia, a tenaculum was placed on  the anterior lip of cervix and the Hulka tenaculum placed in the  endocervical canal.  Afterwards, a vertical infraumbilical incision was  made and a Veress needle placed in the abdomen.  Opening and closing  pressures were 10-15 mmHg. The release trocar was placed in same  incision and the laparoscope placed in the trocar sleeve.  Two 5 mm  ports were made in the left lower quadrant and left hypogastric regions.  Following this, the photography was taken and excision of endometriosis  with sharp dissection was performed in the aforementioned areas  including the anterior peritoneal reflection and the broad ligaments  areas that were superficial to the ureter and/or vascular were  cauterized.   At this point, the right tube and ovary were removed with bipolar  cauterization of the right infundibulopelvic ligament.  The ligament was  cut including the lateral attachments and the right utero-ovarian  ligaments.  The specimen was removed with an Endopouch bag, opened, and  the specimen was noted to have an endometrioma contained  within the  ovary.  Following this, endometriosis on the surface of the left ovary  was cauterized.   An appendectomy was performed with bipolar cauterization of the  mesoappendix to the base.  These appendix was cut.  Two 0 Vicryl loop  ties were placed in the base and one about 8 mm above the first two.  The appendix was cut above the first two ties and removed with an  Endopouch bag.  Copious irrigation with lactated Ringer's of the base  followed.  No active bleeding was noted.  The entire pelvis was  irrigated and irrigant removed.  Bleeding points hemostatically checked.  The ureters were re-identified, gas released, and trocars removed.  Closure of the 10-mm fascial site with 0 Vicryl suture, 4-0 Vicryl for  subcuticular closure, and  Dermabond.  The patient tolerated the  procedure well and returned to the post anesthesia recovery room in  excellent condition.      Ginger Carne, MD  Electronically Signed     SHB/MEDQ  D:  08/16/2006  T:  08/16/2006  Job:  [id number]

## 2010-11-24 NOTE — Group Therapy Note (Signed)
Cassandra Wall, Cassandra Wall           ACCOUNT NO.:  [id number]   MEDICAL RECORD NO.:  [id number]          PATIENT TYPE:  WOC   LOCATION:  [facility]                   FACILITY:  [facility]   PHYSICIAN:  Ginger Carne, MD DATE OF BIRTH:  1980-12-29   DATE OF SERVICE:  06/13/2006                                  CLINIC NOTE   The patient returns today for follow up of right ovarian hemorrhagic  corpus luteal cyst.  She has had two scans approximately 4-5 weeks  apart, the most recent demonstrating an ovary measuring 6 cm containing  a 4 cm cyst which is consistent with hemorrhagic corpus luteal cyst.  There is less echogenicity compared to the previous scan obtained in  November 2007.   The patient complains of +1 to +2 right lower quadrant tenderness but I  am unsure that this is presenting any lifestyle changes for her.   PHYSICAL EXAMINATION:  PELVIC EXAM:  External genitalia:  Vulva and  vagina normal.  Cervix:  Smooth without erosions or lesions.  Uterus:  Small, anteverted and flexed.  Right adnexa is tender with minimal  fullness. left adnexa is normal without tenderness or masses.   IMPRESSION:  Resolving right hemorrhagic corpus luteal cyst.   PLAN:  I asked the patient to have a follow up pelvic sonogram in the  end of January 2008 and then a visit at that time.  If the cyst persist,  then I think we have to consider the possibility of a operative  laparoscopy, which I explained to the patient would only be done in the  event that this lesion did not diminish in size significantly.  I  explained to her that hemorrhagic corpus luteal cysts are common and  that one does not want to go on a roller coaster ride of repeated  laparoscopies over the years as opposed to sitting tight and waiting for  these lesions to resolve.  Certainly if the pain continues and there is  no resolution, one has to consider an endometrioma or the possibility of  a benign serous cystadenoma or mucinous  cystadenoma.           ______________________________  Ginger Carne, MD     SHB/MEDQ  D:  06/13/2006  T:  06/14/2006  Job:  [id number]

## 2010-12-08 ENCOUNTER — Other Ambulatory Visit: Payer: Self-pay | Admitting: Obstetrics & Gynecology

## 2010-12-08 ENCOUNTER — Ambulatory Visit (INDEPENDENT_AMBULATORY_CARE_PROVIDER_SITE_OTHER): Payer: Medicaid Other | Admitting: Obstetrics & Gynecology

## 2010-12-08 DIAGNOSIS — N949 Unspecified condition associated with female genital organs and menstrual cycle: Secondary | ICD-10-CM

## 2010-12-08 DIAGNOSIS — N938 Other specified abnormal uterine and vaginal bleeding: Secondary | ICD-10-CM

## 2010-12-08 DIAGNOSIS — Z01419 Encounter for gynecological examination (general) (routine) without abnormal findings: Secondary | ICD-10-CM

## 2010-12-09 NOTE — Group Therapy Note (Signed)
NAMECLEMENCE, LENGYEL NO.:  [id number]  MEDICAL RECORD NO.:  [id number]           PATIENT TYPE:  A  LOCATION:  [facility]                   FACILITY:  [facility]  PHYSICIAN:  Allie Bossier, MD        DATE OF BIRTH:  02/27/1981  DATE OF SERVICE:  12/08/2010                                 CLINIC NOTE  HISTORY OF PRESENT ILLNESS:  Ms. Magistro is a 30 year old single white G1, P1, with a history of premature delivery at 32 weeks.  Her son is almost 3 and has autism, has what she thinks as autism.  Her complaint today is that she has been bleeding for the last 30 days.  Her periods are always painful, but she has been told by Dr. Mia Creek that she has endometriosis.  She also has a history of high-grade dysplasia, degenerative disk disease for which she received steroid injections, history of anxiety, and depression.  PAST SURGICAL HISTORY:  She has had a right oophorectomy and salpingectomy, an appendectomy in 2008.  She had NSVD at 32 weeks, 3 years ago.  SOCIAL HISTORY:  She currently smokes half-a-pack a day.  She drinks alcohol rarely and denies illegal drug use.  ALLERGIES:  No latex allergies.  No drug allergies.  FAMILY HISTORY:  Negative for breast and colon malignancies.  She has had a great-aunt had uterine cancer.  MEDICATIONS: 1. Klonopin p.r.n. 2. Trazodone nightly. 3. Neurontin 600 mg at night. 4. Albuterol inhaler p.r.n.  REVIEW OF SYSTEMS:  She has been monogamous for last 2 months with her current partner.  She uses condoms but would like to discuss birth control in the future, and she has been having dyspareunia with this partner.  She also complains of recent new-onset excessive bruising on her legs.  PHYSICAL EXAMINATION:  GENERAL:  A well-nourished, well-hydrated pleasant white female. VITAL SIGNS:  Her weight is 126 pounds.  She is 5 feet 8, blood pressure is 116/76, pulse 98.  Please note her weight has decreased by about 40 pounds.   She said there is lots of stress in her life and her weight loss has made her happy. HEENT:  Normal. BREASTS:  Normal. ABDOMEN:  Scaphoid.  She has gotten piercing of her nipple umbilicus. EXTERNAL GENITALIA:  No lesions.  Cervix there is a small amount of bright red bleeding.  Cervix has no visual lesions.  Bimanual exam reveals a 6-week size, retroverted minimally mobile, somewhat tender uterus.  Her adnexa are nontender and without masses.  ASSESSMENT/PLAN: 1. Annual exam. We checked Pap smear with cervical cultures.     Recommended self-breast and self-vulvar exams. 2. Dysfunctional uterine bleeding.  I am checking a CBC, TSH, and GYN     ultrasound.  I will have her follow up when these results are     available in next several weeks, and we will discuss birth control     options at that time until then she will use condoms.     Allie Bossier, MD    MCD/MEDQ  D:  12/08/2010  T:  12/09/2010  Job:  [id number]

## 2010-12-14 ENCOUNTER — Ambulatory Visit (HOSPITAL_COMMUNITY)
Admission: RE | Admit: 2010-12-14 | Discharge: 2010-12-14 | Disposition: A | Discharge status: Home / Self Care | Payer: Medicaid Other | Source: Ambulatory Visit | Attending: Obstetrics & Gynecology | Admitting: Obstetrics & Gynecology

## 2010-12-14 ENCOUNTER — Other Ambulatory Visit (HOSPITAL_COMMUNITY): Payer: Medicaid Other

## 2010-12-14 DIAGNOSIS — N949 Unspecified condition associated with female genital organs and menstrual cycle: Secondary | ICD-10-CM | POA: Insufficient documentation

## 2010-12-14 DIAGNOSIS — Q513 Bicornate uterus: Secondary | ICD-10-CM | POA: Insufficient documentation

## 2010-12-14 DIAGNOSIS — N938 Other specified abnormal uterine and vaginal bleeding: Secondary | ICD-10-CM | POA: Insufficient documentation

## 2010-12-14 DIAGNOSIS — N83209 Unspecified ovarian cyst, unspecified side: Secondary | ICD-10-CM | POA: Insufficient documentation

## 2010-12-22 ENCOUNTER — Ambulatory Visit (INDEPENDENT_AMBULATORY_CARE_PROVIDER_SITE_OTHER): Payer: Medicaid Other | Admitting: Advanced Practice Midwife

## 2010-12-22 DIAGNOSIS — R87613 High grade squamous intraepithelial lesion on cytologic smear of cervix (HGSIL): Secondary | ICD-10-CM

## 2010-12-22 DIAGNOSIS — N83209 Unspecified ovarian cyst, unspecified side: Secondary | ICD-10-CM

## 2010-12-23 NOTE — Group Therapy Note (Unsigned)
NAMEYAMILETT, ANASTOS NO.:  [id number]  MEDICAL RECORD NO.:  [id number]           PATIENT TYPE:  A  LOCATION:  [facility]                   FACILITY:  [facility]  PHYSICIAN:  Wynelle Bourgeois, CNM    DATE OF BIRTH:  06-14-1981  DATE OF SERVICE:  12/22/2010                                 CLINIC NOTE  This is a 30 year old female who is gravida 1, para 1, who presents for followup after her ultrasound and Pap.  She was last seen on June 1 by Dr. Marice Potter for history of menometrorrhagia and dysmenorrhea.  She has a history of endometriosis which was treated surgically in 2008 with a right nephrectomy and salpingectomy and she also has a history of high- grade cervical dysplasia as well as degenerative disk disease, anxiety and depression.  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  She also had absent right ovary and a left ovary showing 2 cysts, both of which were 4.7 cm large, one was simple and one was complex homogeneous with low- level echoes representing an endometrioma or a hemorrhagic cyst.  The radiologist recommended followup ultrasound in 8 weeks.  Her Pap result was reviewed with her and GC, Chlamydia were both negative, but her Pap results continued to show high-grade squamous intraepithelial lesion which was also what it showed back in 2011 and as for back in 2009.  She did have a colposcopy in 2011, which showed high-grade SIL with endocervical cells that were ascus with dysplastic features favoring high-grade lesion.  She states that she did not come back for treatment because she was afraid of what they might find.  We discussed the seriousness of the persistent high-grade lesion and the possibility that if it is left untreated over the years, that it could progress to cervical cancer which would be a much more serious diagnosis and the patient now agrees to come back and have a colposcopy with a  probable LEEP procedure done in our office at the next available appointment.  I reiterated her the importance of having this done and not letting her fear overtake her good judgment.  We also discussed her ovarian cysts which are in a moderately large and causing her pain, and that we usually treat these expectantly with followup ultrasound in 8 weeks.  In the meantime, I will give her some pain medicine to manage these.  We will use ibuprofen every 6 hours as well as Vicodin 1-2 p.o. q.4 h p.r.n. pain, #30 with 1 refills.  Hopefully, this will suffice for the time that the cysts will persist.  I also gave her a prescription for Desogen 1 p.o. daily for her menometrorrhagia to try to regulate her cycles.  She will take 2 tablets each day for 2 days and then go to 1 tablet per day, and I gave her a prescription for a year supply of that, so the next 2 things that need to happen are the colposcopy and probable LEEP procedure to be done as soon as possible and then a followup ultrasound in 8 weeks to reevaluate her ovarian cysts.  ______________________________ Wynelle Bourgeois, CNM    MW/MEDQ  D:  12/22/2010  T:  12/23/2010  Job:  [id number]

## 2011-01-02 ENCOUNTER — Emergency Department (HOSPITAL_COMMUNITY)
Admission: EM | Admit: 2011-01-02 | Discharge: 2011-01-02 | Disposition: A | Discharge status: Home / Self Care | Payer: Medicaid Other | Attending: Emergency Medicine | Admitting: Emergency Medicine

## 2011-01-02 DIAGNOSIS — N83209 Unspecified ovarian cyst, unspecified side: Secondary | ICD-10-CM | POA: Insufficient documentation

## 2011-01-02 DIAGNOSIS — R112 Nausea with vomiting, unspecified: Secondary | ICD-10-CM | POA: Insufficient documentation

## 2011-01-02 DIAGNOSIS — R10814 Left lower quadrant abdominal tenderness: Secondary | ICD-10-CM | POA: Insufficient documentation

## 2011-01-02 LAB — DIFFERENTIAL
Basophils Absolute: 0 10*3/uL (ref 0.0–0.1)
Basophils Relative: 0 % (ref 0–1)
Eosinophils Absolute: 0 10*3/uL (ref 0.0–0.7)
Lymphs Abs: 1.2 10*3/uL (ref 0.7–4.0)
Neutrophils Relative %: 86 % — ABNORMAL HIGH (ref 43–77)

## 2011-01-02 LAB — URINE MICROSCOPIC-ADD ON

## 2011-01-02 LAB — CBC
MCV: 93.5 fL (ref 78.0–100.0)
Platelets: 153 10*3/uL (ref 150–400)
RBC: 4.64 MIL/uL (ref 3.87–5.11)
WBC: 12.3 10*3/uL — ABNORMAL HIGH (ref 4.0–10.5)

## 2011-01-02 LAB — COMPREHENSIVE METABOLIC PANEL
Albumin: 3.9 g/dL (ref 3.5–5.2)
BUN: 20 mg/dL (ref 6–23)
Creatinine, Ser: 0.64 mg/dL (ref 0.50–1.10)
GFR calc Af Amer: >60 mL/min (ref >60)
Glucose, Bld: 106 mg/dL — ABNORMAL HIGH (ref 70–99)
Total Protein: 7.1 g/dL (ref 6.0–8.3)

## 2011-01-02 LAB — URINALYSIS, ROUTINE W REFLEX MICROSCOPIC
Leukocytes, UA: NEGATIVE
Nitrite: POSITIVE — AB
Specific Gravity, Urine: 1.026 (ref 1.005–1.030)
Urobilinogen, UA: 0.2 mg/dL (ref 0.0–1.0)
pH: 6 (ref 5.0–8.0)

## 2011-01-02 LAB — POCT PREGNANCY, URINE: Preg Test, Ur: NEGATIVE

## 2011-01-04 ENCOUNTER — Emergency Department (HOSPITAL_COMMUNITY)
Admission: EM | Admit: 2011-01-04 | Discharge: 2011-01-04 | Disposition: A | Discharge status: Home / Self Care | Payer: Medicaid Other | Attending: Emergency Medicine | Admitting: Emergency Medicine

## 2011-01-04 DIAGNOSIS — R10814 Left lower quadrant abdominal tenderness: Secondary | ICD-10-CM | POA: Insufficient documentation

## 2011-01-04 DIAGNOSIS — R112 Nausea with vomiting, unspecified: Secondary | ICD-10-CM | POA: Insufficient documentation

## 2011-01-04 DIAGNOSIS — B977 Papillomavirus as the cause of diseases classified elsewhere: Secondary | ICD-10-CM | POA: Insufficient documentation

## 2011-01-04 DIAGNOSIS — N390 Urinary tract infection, site not specified: Secondary | ICD-10-CM | POA: Insufficient documentation

## 2011-01-04 LAB — URINE MICROSCOPIC-ADD ON

## 2011-01-04 LAB — URINALYSIS, ROUTINE W REFLEX MICROSCOPIC
Glucose, UA: NEGATIVE mg/dL
Hgb urine dipstick: NEGATIVE
Leukocytes, UA: NEGATIVE
Protein, ur: NEGATIVE mg/dL
Specific Gravity, Urine: 1.027 (ref 1.005–1.030)
Urobilinogen, UA: 1 mg/dL (ref 0.0–1.0)

## 2011-01-04 LAB — CBC
HCT: 41.8 % (ref 36.0–46.0)
MCV: 92.3 fL (ref 78.0–100.0)
Platelets: 141 10*3/uL — ABNORMAL LOW (ref 150–400)
RBC: 4.53 MIL/uL (ref 3.87–5.11)
RDW: 13.3 % (ref 11.5–15.5)
WBC: 8.1 10*3/uL (ref 4.0–10.5)

## 2011-01-04 LAB — POCT I-STAT, CHEM 8
Chloride: 109 mEq/L (ref 96–112)
Creatinine, Ser: 0.7 mg/dL (ref 0.50–1.10)
Glucose, Bld: 106 mg/dL — ABNORMAL HIGH (ref 70–99)
Hemoglobin: 15 g/dL (ref 12.0–15.0)
Potassium: 3.1 mEq/L — ABNORMAL LOW (ref 3.5–5.1)
Sodium: 141 mEq/L (ref 135–145)

## 2011-01-06 LAB — URINE CULTURE
Colony Count: >=100000
Culture  Setup Time: 201206281315

## 2011-02-02 DIAGNOSIS — IMO0002 Reserved for concepts with insufficient information to code with codable children: Secondary | ICD-10-CM

## 2011-02-02 DIAGNOSIS — N809 Endometriosis, unspecified: Secondary | ICD-10-CM | POA: Insufficient documentation

## 2011-02-08 ENCOUNTER — Encounter: Payer: Medicaid Other | Admitting: Family Medicine

## 2011-02-12 ENCOUNTER — Other Ambulatory Visit: Payer: Self-pay | Admitting: Physical Medicine and Rehabilitation

## 2011-02-12 ENCOUNTER — Ambulatory Visit
Admission: RE | Admit: 2011-02-12 | Discharge: 2011-02-12 | Disposition: A | Discharge status: Home / Self Care | Payer: Medicaid Other | Source: Ambulatory Visit | Attending: Physical Medicine and Rehabilitation | Admitting: Physical Medicine and Rehabilitation

## 2011-02-12 DIAGNOSIS — M542 Cervicalgia: Secondary | ICD-10-CM

## 2011-02-12 DIAGNOSIS — M5412 Radiculopathy, cervical region: Secondary | ICD-10-CM

## 2011-03-05 ENCOUNTER — Emergency Department (HOSPITAL_COMMUNITY)
Admission: EM | Admit: 2011-03-05 | Discharge: 2011-03-05 | Disposition: A | Discharge status: Home / Self Care | Payer: Medicaid Other | Attending: Emergency Medicine | Admitting: Emergency Medicine

## 2011-03-05 DIAGNOSIS — N12 Tubulo-interstitial nephritis, not specified as acute or chronic: Secondary | ICD-10-CM | POA: Insufficient documentation

## 2011-03-05 DIAGNOSIS — R109 Unspecified abdominal pain: Secondary | ICD-10-CM | POA: Insufficient documentation

## 2011-03-05 LAB — POCT I-STAT, CHEM 8
BUN: 15 mg/dL (ref 6–23)
Calcium, Ion: 1.18 mmol/L (ref 1.12–1.32)
Chloride: 107 mEq/L (ref 96–112)
HCT: 46 % (ref 36.0–46.0)
Potassium: 3.5 mEq/L (ref 3.5–5.1)
Sodium: 141 mEq/L (ref 135–145)

## 2011-03-05 LAB — URINE MICROSCOPIC-ADD ON

## 2011-03-05 LAB — CBC
Platelets: 166 10*3/uL (ref 150–400)
RBC: 4.6 MIL/uL (ref 3.87–5.11)
RDW: 13.1 % (ref 11.5–15.5)
WBC: 10.1 10*3/uL (ref 4.0–10.5)

## 2011-03-05 LAB — URINALYSIS, ROUTINE W REFLEX MICROSCOPIC
Hgb urine dipstick: NEGATIVE
Nitrite: NEGATIVE
Specific Gravity, Urine: 1.04 — ABNORMAL HIGH (ref 1.005–1.030)
Urobilinogen, UA: 0.2 mg/dL (ref 0.0–1.0)
pH: 6 (ref 5.0–8.0)

## 2011-03-05 LAB — DIFFERENTIAL
Basophils Absolute: 0 10*3/uL (ref 0.0–0.1)
Eosinophils Absolute: 0.1 10*3/uL (ref 0.0–0.7)
Eosinophils Relative: 1 % (ref 0–5)
Neutrophils Relative %: 69 % (ref 43–77)

## 2011-03-06 LAB — URINE CULTURE
Colony Count: >=100000
Culture  Setup Time: 201208271029

## 2011-03-15 ENCOUNTER — Emergency Department (HOSPITAL_BASED_OUTPATIENT_CLINIC_OR_DEPARTMENT_OTHER)
Admission: EM | Admit: 2011-03-15 | Discharge: 2011-03-16 | Disposition: A | Discharge status: Home / Self Care | Payer: Medicaid Other | Attending: Emergency Medicine | Admitting: Emergency Medicine

## 2011-03-15 DIAGNOSIS — F17200 Nicotine dependence, unspecified, uncomplicated: Secondary | ICD-10-CM | POA: Insufficient documentation

## 2011-03-15 DIAGNOSIS — R109 Unspecified abdominal pain: Secondary | ICD-10-CM

## 2011-03-15 DIAGNOSIS — N83209 Unspecified ovarian cyst, unspecified side: Secondary | ICD-10-CM | POA: Insufficient documentation

## 2011-03-15 DIAGNOSIS — R509 Fever, unspecified: Secondary | ICD-10-CM | POA: Insufficient documentation

## 2011-03-15 HISTORY — DX: Urinary tract infection, site not specified: N39.0

## 2011-03-15 HISTORY — DX: Sciatica, unspecified side: M54.30

## 2011-03-15 HISTORY — DX: Unspecified osteoarthritis, unspecified site: M19.90

## 2011-03-15 LAB — URINALYSIS, ROUTINE W REFLEX MICROSCOPIC
Glucose, UA: NEGATIVE mg/dL
Ketones, ur: NEGATIVE mg/dL
Leukocytes, UA: NEGATIVE
pH: 6 (ref 5.0–8.0)

## 2011-03-15 LAB — URINE MICROSCOPIC-ADD ON

## 2011-03-15 NOTE — ED Notes (Signed)
C/o fever, abd pain -recent tx of UTIs

## 2011-03-16 ENCOUNTER — Emergency Department (INDEPENDENT_AMBULATORY_CARE_PROVIDER_SITE_OTHER): Payer: Medicaid Other

## 2011-03-16 ENCOUNTER — Other Ambulatory Visit (HOSPITAL_BASED_OUTPATIENT_CLINIC_OR_DEPARTMENT_OTHER): Payer: Self-pay | Admitting: Emergency Medicine

## 2011-03-16 ENCOUNTER — Other Ambulatory Visit (HOSPITAL_BASED_OUTPATIENT_CLINIC_OR_DEPARTMENT_OTHER): Payer: Medicaid Other

## 2011-03-16 ENCOUNTER — Ambulatory Visit (INDEPENDENT_AMBULATORY_CARE_PROVIDER_SITE_OTHER)
Admission: RE | Admit: 2011-03-16 | Discharge: 2011-03-16 | Disposition: A | Discharge status: Home / Self Care | Payer: Medicaid Other | Source: Ambulatory Visit | Attending: Emergency Medicine | Admitting: Emergency Medicine

## 2011-03-16 DIAGNOSIS — N838 Other noninflammatory disorders of ovary, fallopian tube and broad ligament: Secondary | ICD-10-CM

## 2011-03-16 DIAGNOSIS — R109 Unspecified abdominal pain: Secondary | ICD-10-CM

## 2011-03-16 DIAGNOSIS — R52 Pain, unspecified: Secondary | ICD-10-CM

## 2011-03-16 DIAGNOSIS — Q513 Bicornate uterus: Secondary | ICD-10-CM

## 2011-03-16 LAB — COMPREHENSIVE METABOLIC PANEL
ALT: 6 U/L (ref 0–35)
BUN: 11 mg/dL (ref 6–23)
Calcium: 9.3 mg/dL (ref 8.4–10.5)
GFR calc Af Amer: >60 mL/min (ref >60)
Glucose, Bld: 89 mg/dL (ref 70–99)
Sodium: 141 mEq/L (ref 135–145)
Total Protein: 6.6 g/dL (ref 6.0–8.3)

## 2011-03-16 LAB — CBC
HCT: 37.7 % (ref 36.0–46.0)
Hemoglobin: 12.3 g/dL (ref 12.0–15.0)
WBC: 12.2 10*3/uL — ABNORMAL HIGH (ref 4.0–10.5)

## 2011-03-16 LAB — WET PREP, GENITAL
Clue Cells Wet Prep HPF POC: NONE SEEN
Trich, Wet Prep: NONE SEEN

## 2011-03-16 LAB — GC/CHLAMYDIA PROBE AMP, GENITAL
Chlamydia, DNA Probe: NEGATIVE
GC Probe Amp, Genital: NEGATIVE

## 2011-03-16 MED ORDER — HYDROCODONE-ACETAMINOPHEN 5-500 MG PO TABS: 2.0000 | ORAL_TABLET | Freq: Four times a day (QID) | ORAL | Status: AC | PRN

## 2011-03-16 MED ORDER — KETOROLAC TROMETHAMINE 60 MG/2ML IM SOLN
60.0000 mg | Freq: Once | INTRAMUSCULAR | Status: AC
  Administered 2011-03-16: 60 mg via INTRAMUSCULAR
  Filled 2011-03-16: qty 2

## 2011-03-16 NOTE — ED Provider Notes (Signed)
History     CSN: [id number] Arrival date & time: 03/15/2011 11:36 PM  Chief Complaint  Patient presents with  . Fever  . Abdominal Pain   Patient is a 30 y.o. female presenting with fever and abdominal pain. The history is provided by the patient. No language interpreter was used.  Fever Primary symptoms of the febrile illness include fever and abdominal pain. Primary symptoms do not include arthralgias. This is a chronic problem. The problem has not changed since onset. The abdominal pain began more than 2 days ago. The abdominal pain has been unchanged since its onset. The abdominal pain is located in the right flank. The abdominal pain radiates to the suprapubic region. The severity of the abdominal pain is 10/10. The abdominal pain is relieved by nothing.  Associated with: none.  Abdominal Pain The primary symptoms of the illness include abdominal pain and fever.    Past Medical History  Diagnosis Date  . UTI (lower urinary tract infection)   . Arthritis   . Sciatica     Past Surgical History  Procedure Date  . Appendectomy   . Oophorectomy   . Salpingoophorectomy     No family history on file.  History  Substance Use Topics  . Smoking status: Current Everyday Smoker  . Smokeless tobacco: Not on file  . Alcohol Use: Yes    OB History    Grav Para Term Preterm Abortions TAB SAB Ect Mult Living                  Review of Systems  Constitutional: Positive for fever. Negative for activity change.  HENT: Negative for facial swelling.   Eyes: Negative for discharge.  Respiratory: Negative for apnea.   Cardiovascular: Negative for chest pain.  Gastrointestinal: Positive for abdominal pain.  Genitourinary: Negative for difficulty urinating.  Musculoskeletal: Negative for arthralgias.  Neurological: Negative for dizziness.  Hematological: Negative.   Psychiatric/Behavioral: Negative.     Physical Exam  BP 136/74  Pulse 103  Temp(Src) 98.3 F (36.8 C) (Oral)   Resp 16  Ht 5\' 8"  (1.727 m)  Wt 116 lb (52.617 kg)  BMI 17.64 kg/m2  SpO2 97%  LMP 03/07/2011  Physical Exam  Constitutional: She is oriented to person, place, and time. She appears well-developed and well-nourished. No distress.  HENT:  Head: Normocephalic and atraumatic.  Eyes: Pupils are equal, round, and reactive to light.  Neck: Normal range of motion. Neck supple.  Cardiovascular: Normal rate and regular rhythm.   Pulmonary/Chest: Effort normal and breath sounds normal. No respiratory distress.  Abdominal: Soft. Bowel sounds are normal.  Genitourinary:       Chaperone present no CMT no adnexal masses or tenderness  Musculoskeletal: Normal range of motion.  Neurological: She is alert and oriented to person, place, and time.  Skin: Skin is warm and dry.  Psychiatric: She has a normal mood and affect.    ED Course  Procedures  MDM Follow up for pelvic US in am      Ellard Nan K Brittinee Risk-Rasch, MD 03/16/11 [phone]

## 2011-03-16 NOTE — ED Notes (Signed)
Pt c/o abd pain and symptoms consistent with UTI. Pt has been treated several times in last few weeks with no improvement in symptoms. Pt finished bactrim x 1 week ago.

## 2011-03-29 LAB — POCT URINALYSIS DIP (DEVICE)
Bilirubin Urine: NEGATIVE
Glucose, UA: NEGATIVE
Hgb urine dipstick: NEGATIVE
Ketones, ur: NEGATIVE
Specific Gravity, Urine: 1.02

## 2011-03-30 LAB — POCT URINALYSIS DIP (DEVICE)
Bilirubin Urine: NEGATIVE
Glucose, UA: NEGATIVE
Hgb urine dipstick: NEGATIVE
Ketones, ur: 15 — AB
Nitrite: NEGATIVE
Specific Gravity, Urine: 1.015

## 2011-04-02 LAB — POCT URINALYSIS DIP (DEVICE)
Bilirubin Urine: NEGATIVE
Glucose, UA: NEGATIVE
Nitrite: NEGATIVE

## 2011-04-03 LAB — POCT URINALYSIS DIP (DEVICE)
Ketones, ur: NEGATIVE
Operator id: 148111
Protein, ur: 30 — AB
Specific Gravity, Urine: 1.015
Urobilinogen, UA: 0.2
pH: 7

## 2011-04-03 LAB — CBC
HCT: 31.5 — ABNORMAL LOW
Hemoglobin: 9.4 — ABNORMAL LOW
MCHC: 34.6
MCV: 93.1
Platelets: 141 — ABNORMAL LOW
Platelets: 177
RDW: 14.2
RDW: 14.4
WBC: 18.7 — ABNORMAL HIGH

## 2011-04-03 LAB — RPR: RPR Ser Ql: NONREACTIVE

## 2011-04-03 LAB — URINALYSIS, ROUTINE W REFLEX MICROSCOPIC
Nitrite: NEGATIVE
Protein, ur: NEGATIVE
Specific Gravity, Urine: 1.01
Urobilinogen, UA: 0.2

## 2011-04-05 ENCOUNTER — Telehealth: Payer: Self-pay | Admitting: *Deleted

## 2011-04-05 NOTE — Telephone Encounter (Signed)
Pt left voicemail stating that she is having vaginal swelling. Wanted to know if this is normal, and what can she do to make it better?

## 2011-04-06 NOTE — Telephone Encounter (Signed)
Called pt no answer, left voicemail to call us back.

## 2011-04-06 NOTE — Telephone Encounter (Signed)
Called pt- she states that she noticed the vaginal swelling yesterday while having intercourse and she was also having pain.  Pt also states she was very dry @ the time and they were not using any toys, vibrators, etc.  Today, the swelling is gone and she does not have any discomfort.  I advised pt to get some personal lubricant to use for vaginal dryness. Pt voiced understanding.

## 2011-04-08 ENCOUNTER — Emergency Department (HOSPITAL_COMMUNITY)
Admission: EM | Admit: 2011-04-08 | Discharge: 2011-04-08 | Disposition: A | Discharge status: Home / Self Care | Payer: Medicaid Other | Attending: Emergency Medicine | Admitting: Emergency Medicine

## 2011-04-08 DIAGNOSIS — K089 Disorder of teeth and supporting structures, unspecified: Secondary | ICD-10-CM | POA: Insufficient documentation

## 2011-04-08 DIAGNOSIS — R220 Localized swelling, mass and lump, head: Secondary | ICD-10-CM | POA: Insufficient documentation

## 2011-04-13 LAB — POCT URINALYSIS DIP (DEVICE)
Ketones, ur: NEGATIVE
Operator id: 297281
Protein, ur: NEGATIVE
Urobilinogen, UA: 0.2
pH: 6.5

## 2011-04-13 LAB — URINALYSIS, ROUTINE W REFLEX MICROSCOPIC
Glucose, UA: NEGATIVE
Ketones, ur: NEGATIVE
Nitrite: NEGATIVE
Protein, ur: NEGATIVE
Urobilinogen, UA: 0.2

## 2011-04-16 LAB — POCT URINALYSIS DIP (DEVICE)
Ketones, ur: NEGATIVE
Protein, ur: NEGATIVE
Urobilinogen, UA: 0.2
pH: 7

## 2011-04-17 LAB — URINALYSIS, ROUTINE W REFLEX MICROSCOPIC
Bilirubin Urine: NEGATIVE
Glucose, UA: NEGATIVE
Ketones, ur: >80 — AB
Protein, ur: NEGATIVE
pH: 7

## 2011-04-17 LAB — URINE CULTURE: Colony Count: >=100000

## 2011-04-17 LAB — POCT URINALYSIS DIP (DEVICE)
Hgb urine dipstick: NEGATIVE
Protein, ur: NEGATIVE
Specific Gravity, Urine: 1.025
Urobilinogen, UA: 0.2
pH: 7

## 2011-04-17 LAB — URINE MICROSCOPIC-ADD ON

## 2011-04-18 ENCOUNTER — Emergency Department (HOSPITAL_COMMUNITY)
Admission: EM | Admit: 2011-04-18 | Discharge: 2011-04-18 | Disposition: A | Discharge status: Home / Self Care | Payer: Medicaid Other | Attending: Emergency Medicine | Admitting: Emergency Medicine

## 2011-04-18 ENCOUNTER — Emergency Department (HOSPITAL_COMMUNITY): Payer: Medicaid Other

## 2011-04-18 DIAGNOSIS — R51 Headache: Secondary | ICD-10-CM | POA: Insufficient documentation

## 2011-04-18 DIAGNOSIS — IMO0002 Reserved for concepts with insufficient information to code with codable children: Secondary | ICD-10-CM | POA: Insufficient documentation

## 2011-04-18 DIAGNOSIS — M1990 Unspecified osteoarthritis, unspecified site: Secondary | ICD-10-CM | POA: Insufficient documentation

## 2011-04-18 DIAGNOSIS — M542 Cervicalgia: Secondary | ICD-10-CM | POA: Insufficient documentation

## 2011-04-18 LAB — CBC
MCH: 30.7 pg (ref 26.0–34.0)
MCHC: 32.4 g/dL (ref 30.0–36.0)
MCV: 94.8 fL (ref 78.0–100.0)
Platelets: 144 10*3/uL — ABNORMAL LOW (ref 150–400)
RDW: 12.7 % (ref 11.5–15.5)

## 2011-04-18 LAB — CSF CELL COUNT WITH DIFFERENTIAL
RBC Count, CSF: 0 /mm3
RBC Count, CSF: 1 /mm3 — ABNORMAL HIGH
Tube #: 1
Tube #: 4

## 2011-04-18 LAB — BASIC METABOLIC PANEL
CO2: 29 mEq/L (ref 19–32)
Calcium: 8.9 mg/dL (ref 8.4–10.5)
Creatinine, Ser: 0.54 mg/dL (ref 0.50–1.10)
GFR calc Af Amer: >90 mL/min (ref >90)

## 2011-04-18 LAB — PROTEIN, CSF: Total  Protein, CSF: 19 mg/dL (ref 15–45)

## 2011-04-18 MED ORDER — IOHEXOL 300 MG/ML  SOLN
80.0000 mL | Freq: Once | INTRAMUSCULAR | Status: AC | PRN
  Administered 2011-04-18: 80 mL via INTRAVENOUS

## 2011-04-19 ENCOUNTER — Encounter: Payer: Medicaid Other | Admitting: Family Medicine

## 2011-04-19 LAB — URINALYSIS, ROUTINE W REFLEX MICROSCOPIC
Hgb urine dipstick: NEGATIVE
Ketones, ur: >80 — AB
Protein, ur: 100 — AB
Urobilinogen, UA: 1

## 2011-04-19 LAB — COMPREHENSIVE METABOLIC PANEL
ALT: 10
AST: 20
Alkaline Phosphatase: 49
CO2: 27
GFR calc non Af Amer: >60
Glucose, Bld: 95
Potassium: 3.7
Sodium: 140

## 2011-04-19 LAB — WET PREP, GENITAL: Clue Cells Wet Prep HPF POC: NONE SEEN

## 2011-04-19 LAB — GC/CHLAMYDIA PROBE AMP, GENITAL: Chlamydia, DNA Probe: NEGATIVE

## 2011-04-19 LAB — CBC
Hemoglobin: 13.3
RBC: 4.31
WBC: 8.3

## 2011-04-19 LAB — POCT PREGNANCY, URINE
Operator id: 263291
Preg Test, Ur: POSITIVE

## 2011-04-21 LAB — ASPERGILLUS GALACTOMANNAN ANTIGEN: Aspergillus galactomannan Index: 0.1 (ref <0.5)

## 2011-04-22 LAB — CSF CULTURE W GRAM STAIN: Culture: NO GROWTH

## 2011-04-23 LAB — CBC
Hemoglobin: 12.8
MCHC: 33.5
Platelets: 169
RDW: 14.9 — ABNORMAL HIGH

## 2011-04-23 LAB — DIFFERENTIAL
Basophils Absolute: 0
Basophils Relative: 1
Monocytes Absolute: 0.4
Neutro Abs: 3
Neutrophils Relative %: 57

## 2011-04-23 LAB — URINALYSIS, ROUTINE W REFLEX MICROSCOPIC
Glucose, UA: NEGATIVE
Leukocytes, UA: NEGATIVE
Specific Gravity, Urine: 1.019
pH: 6.5

## 2011-04-23 LAB — URINE MICROSCOPIC-ADD ON

## 2011-04-23 LAB — URINE CULTURE

## 2011-04-23 LAB — PREGNANCY, URINE: Preg Test, Ur: NEGATIVE

## 2011-04-26 LAB — DIFFERENTIAL
Eosinophils Absolute: 0.2
Eosinophils Relative: 2
Lymphs Abs: 1.6
Monocytes Absolute: 0.4
Monocytes Relative: 5

## 2011-04-26 LAB — URINALYSIS, ROUTINE W REFLEX MICROSCOPIC
Nitrite: NEGATIVE
Protein, ur: NEGATIVE
Urobilinogen, UA: 0.2

## 2011-04-26 LAB — CBC
HCT: 37.6
MCV: 89.3
RBC: 4.21
WBC: 8.8

## 2011-04-26 LAB — BASIC METABOLIC PANEL
Chloride: 105
GFR calc Af Amer: >60
Potassium: 3.8

## 2011-04-26 LAB — WET PREP, GENITAL: WBC, Wet Prep HPF POC: NONE SEEN

## 2011-04-26 LAB — URINE MICROSCOPIC-ADD ON

## 2011-04-26 LAB — PREGNANCY, URINE: Preg Test, Ur: NEGATIVE

## 2011-05-30 ENCOUNTER — Telehealth: Payer: Self-pay

## 2011-05-30 NOTE — Telephone Encounter (Signed)
Pt returned called and she informed me that it has been 8months- 1 year since her last visit with Hilda Lias.  She went to the ER at [facility] and told her that her cyst has become more complex and that she needed to get it removed.  I informed pt that she needed to make an appt for a evaluation pt stating understanding but would not be able to make appt till after the New Year, in which our schedule is not open yet to make.  I gave pt our # and informed her to call us in a week to make the appt.  Pt stated understanding.

## 2011-05-30 NOTE — Telephone Encounter (Signed)
Called pt and left message that we are returning her call to please give [place] call her at the clinics.

## 2011-05-30 NOTE — Telephone Encounter (Signed)
1503 Pt called and stated that she had been to the emergency room and they informed her that the cyst that Cassandra Wall and Dr. Marice Potter stated would dissolve on its on has not and the ER told her that it was complex and needed to have it removed.  The pt wants to talk about having the cyst removed.

## 2011-07-12 ENCOUNTER — Emergency Department (HOSPITAL_BASED_OUTPATIENT_CLINIC_OR_DEPARTMENT_OTHER)
Admission: EM | Admit: 2011-07-12 | Discharge: 2011-07-12 | Disposition: A | Discharge status: Home / Self Care | Payer: Medicaid Other | Attending: Emergency Medicine | Admitting: Emergency Medicine

## 2011-07-12 ENCOUNTER — Emergency Department (HOSPITAL_COMMUNITY)
Admission: EM | Admit: 2011-07-12 | Discharge: 2011-07-12 | Disposition: A | Discharge status: Home / Self Care | Payer: Medicaid Other | Source: Home / Self Care | Attending: Emergency Medicine | Admitting: Emergency Medicine

## 2011-07-12 ENCOUNTER — Encounter (HOSPITAL_COMMUNITY): Payer: Self-pay

## 2011-07-12 DIAGNOSIS — N390 Urinary tract infection, site not specified: Secondary | ICD-10-CM | POA: Insufficient documentation

## 2011-07-12 DIAGNOSIS — R110 Nausea: Secondary | ICD-10-CM | POA: Insufficient documentation

## 2011-07-12 DIAGNOSIS — F17200 Nicotine dependence, unspecified, uncomplicated: Secondary | ICD-10-CM | POA: Insufficient documentation

## 2011-07-12 DIAGNOSIS — M129 Arthropathy, unspecified: Secondary | ICD-10-CM | POA: Insufficient documentation

## 2011-07-12 DIAGNOSIS — R300 Dysuria: Secondary | ICD-10-CM | POA: Insufficient documentation

## 2011-07-12 DIAGNOSIS — N83209 Unspecified ovarian cyst, unspecified side: Secondary | ICD-10-CM | POA: Insufficient documentation

## 2011-07-12 DIAGNOSIS — Z79899 Other long term (current) drug therapy: Secondary | ICD-10-CM | POA: Insufficient documentation

## 2011-07-12 DIAGNOSIS — N949 Unspecified condition associated with female genital organs and menstrual cycle: Secondary | ICD-10-CM | POA: Insufficient documentation

## 2011-07-12 LAB — URINE MICROSCOPIC-ADD ON

## 2011-07-12 LAB — URINALYSIS, ROUTINE W REFLEX MICROSCOPIC
Glucose, UA: NEGATIVE mg/dL
Hgb urine dipstick: NEGATIVE
Specific Gravity, Urine: 1.029 (ref 1.005–1.030)
Urobilinogen, UA: 0.2 mg/dL (ref 0.0–1.0)

## 2011-07-12 MED ORDER — SODIUM CHLORIDE 0.9 % IV BOLUS (SEPSIS)
1000.0000 mL | Freq: Once | INTRAVENOUS | Status: AC
  Administered 2011-07-12: 1000 mL via INTRAVENOUS

## 2011-07-12 MED ORDER — ONDANSETRON HCL 4 MG/2ML IJ SOLN
4.0000 mg | Freq: Once | INTRAMUSCULAR | Status: AC
  Administered 2011-07-12: 4 mg via INTRAVENOUS
  Filled 2011-07-12: qty 2

## 2011-07-12 MED ORDER — OXYCODONE-ACETAMINOPHEN 7.5-500 MG PO TABS: 1.0000 | ORAL_TABLET | ORAL | Status: AC | PRN

## 2011-07-12 MED ORDER — HYDROMORPHONE HCL PF 1 MG/ML IJ SOLN
1.0000 mg | Freq: Once | INTRAMUSCULAR | Status: AC
  Administered 2011-07-12: 1 mg via INTRAVENOUS
  Filled 2011-07-12: qty 1

## 2011-07-12 MED ORDER — HYDROMORPHONE HCL PF 1 MG/ML IJ SOLN
0.5000 mg | Freq: Once | INTRAMUSCULAR | Status: AC
  Administered 2011-07-12: 0.5 mg via INTRAVENOUS
  Filled 2011-07-12: qty 1

## 2011-07-12 MED ORDER — DEXTROSE 5 % IV SOLN
1.0000 g | Freq: Once | INTRAVENOUS | Status: AC
  Administered 2011-07-12: 1 g via INTRAVENOUS
  Filled 2011-07-12: qty 10

## 2011-07-12 NOTE — ED Notes (Signed)
Patient reports that she has known large left ovarian cyst that she will be having surgery on, has developed increasing pain from same, no relief with otc meds

## 2011-07-12 NOTE — ED Notes (Signed)
C/o left lower quadrant pain--history for left ovarian cyst which produced similar type pain--rates her pain a 10 on 1-10 scale.

## 2011-07-12 NOTE — ED Notes (Signed)
Pt. Unable to transfere due to she was never triaged or seen as a Pt. In the [facility] ED.  Unable to transfere to [employer] due to this.  Pt. Is in ED at [facility] at present time per [place].

## 2011-07-12 NOTE — ED Provider Notes (Signed)
History     CSN: [id number]  Arrival date & time 07/12/11  1544   First MD Initiated Contact with Patient 07/12/11 1714      No chief complaint on file.  HPI  Patient presents to the emergency room with complaint of left adnexal/pelvic pain that is chronic in nature. Patient has a known ovarian cyst on the left that is suppose to be surgically removed by Dr. Marice Potter in 2 weeks at [facility] hospital. Patient has an appointment with them for pre-op on January 9. Reports some associated nausea. Denies any vomiting. Normal bowel movement. Denies any vaginal bleeding or discharge. Denies any chest pain or SOB. Reports some dysuria. Denies any back pain. Denies any abdominal pain.   Past Medical History  Diagnosis Date  . UTI (lower urinary tract infection)   . Arthritis   . Sciatica     Past Surgical History  Procedure Date  . Appendectomy   . Oophorectomy   . Salpingoophorectomy     No family history on file.  History  Substance Use Topics  . Smoking status: Current Everyday Smoker  . Smokeless tobacco: Not on file  . Alcohol Use: Yes    OB History    Grav Para Term Preterm Abortions TAB SAB Ect Mult Living                  Review of Systems  Constitutional: Negative for fever, chills, diaphoresis and appetite change.  HENT: Negative for neck pain.   Eyes: Negative for photophobia and visual disturbance.  Respiratory: Negative for cough, chest tightness and shortness of breath.   Cardiovascular: Negative for chest pain.  Gastrointestinal: Negative for nausea, vomiting and abdominal pain.  Genitourinary: Positive for dysuria and pelvic pain. Negative for frequency, hematuria, flank pain, decreased urine volume, vaginal bleeding, vaginal discharge, enuresis, difficulty urinating, genital sores, vaginal pain, menstrual problem and dyspareunia.  Musculoskeletal: Negative for back pain.  Skin: Negative for rash.  Neurological: Negative for weakness and numbness.  All other  systems reviewed and are negative.    Allergies  Ciprofloxacin hcl and Latex  Home Medications   Current Outpatient Rx  Name Route Sig Dispense Refill  . ALPRAZOLAM 0.5 MG PO TABS Oral Take 0.5 mg by mouth 3 (three) times daily as needed. For anxiety.     Marland Kitchen GABAPENTIN 300 MG PO CAPS Oral Take 300 mg by mouth 3 (three) times daily.     . NORETHINDRONE ACETATE 5 MG PO TABS Oral Take 5 mg by mouth 2 (two) times daily.      . OXYCODONE-ACETAMINOPHEN 5-325 MG PO TABS Oral Take 1-2 tablets by mouth every 6 (six) hours as needed. For pain.       BP 132/66  Pulse 101  Temp 99.1 F (37.3 C)  Resp 18  SpO2 100%  Physical Exam  Nursing note and vitals reviewed. Constitutional: She is oriented to person, place, and time. She appears well-developed and well-nourished.  Non-toxic appearance. No distress.       Vital signs stable  HENT:  Head: Normocephalic and atraumatic.  Eyes: EOM are normal. Pupils are equal, round, and reactive to light.  Neck: Normal range of motion. Neck supple.  Cardiovascular: Normal rate, regular rhythm, normal heart sounds and intact distal pulses.  Exam reveals no gallop and no friction rub.   No murmur heard. Pulmonary/Chest: Effort normal and breath sounds normal. No respiratory distress. She has no wheezes.  Abdominal: Soft. Bowel sounds are normal. There is no  tenderness. There is no rebound and no guarding.  Genitourinary: Vagina normal.       Chaperone present during examination. Gross anatomy is normal. Left adnexal tenderness on examination.   Musculoskeletal: Normal range of motion. She exhibits no edema and no tenderness.  Neurological: She is alert and oriented to person, place, and time. She exhibits normal muscle tone.  Skin: Skin is warm and dry. No rash noted. She is not diaphoretic.  Psychiatric: She has a normal mood and affect. Her behavior is normal. Judgment and thought content normal.    ED Course  Procedures (including critical care  time)  Patient seen and evaluated.  VSS reviewed. . Nursing notes reviewed.  Initial testing ordered. Will monitor the patient closely. They agree with the treatment plan and diagnosis. Patient refused to have pelvic performed. However due to the pain being consistent with her previous pain with cyst, minimal tenderness and no vaginal discharge/bleeding/negative pregnancy and the patient being of sound mind I think this is reasonable at this time. Stated that she will follow up with the gynecologist next week to have one done there. VSS    Results for orders placed during the hospital encounter of 07/12/11  URINALYSIS, ROUTINE W REFLEX MICROSCOPIC      Component Value Range   Color, Urine YELLOW  YELLOW    APPearance CLOUDY (*) CLEAR    Specific Gravity, Urine 1.029  1.005 - 1.030    pH 6.5  5.0 - 8.0    Glucose, UA NEGATIVE  NEGATIVE (mg/dL)   Hgb urine dipstick NEGATIVE  NEGATIVE    Bilirubin Urine NEGATIVE  NEGATIVE    Ketones, ur 15 (*) NEGATIVE (mg/dL)   Protein, ur NEGATIVE  NEGATIVE (mg/dL)   Urobilinogen, UA 0.2  0.0 - 1.0 (mg/dL)   Nitrite POSITIVE (*) NEGATIVE    Leukocytes, UA SMALL (*) NEGATIVE   POCT PREGNANCY, URINE      Component Value Range   Preg Test, Ur NEGATIVE    URINE MICROSCOPIC-ADD ON      Component Value Range   Squamous Epithelial / LPF MANY (*) RARE    WBC, UA 3-6  <3 (WBC/hpf)   RBC / HPF 0-2  <3 (RBC/hpf)   Bacteria, UA MANY (*) RARE    No results found.  Patient seen and re-evaluated. Resting comfortably. VSS stable. NAD. Patient notified of testing results. Stated agreement and understanding. Patient stated understanding to treatment plan and diagnosis.   7:44 PM no pain on re-examination. States that she is ready to go home. Advised of warning signs to return. Stated agreement and understanding.   MDM  Urinary tract infection, will place patient on bactrim as she has been sensitive to this medication in the past. No pain at this time. No testing  needed at this time as no abdominal pain. Typical pain, not worse than normal. Out of her pain medication per patient. Stated that she has an appointment with her gynecologist next week.  Left Ovarian cyst    Demetrius Charity, PA 07/12/11 1945

## 2011-07-12 NOTE — ED Notes (Signed)
Jessica from [place] ED called and wants this Pt. Transferred to the ED at [facility] due to she is there to be seen.

## 2011-07-13 NOTE — ED Provider Notes (Signed)
Medical screening examination/treatment/procedure(s) were performed by non-physician practitioner and as supervising physician I was immediately available for consultation/collaboration.  Flint Melter, MD 07/13/11 [phone]

## 2011-07-14 LAB — URINE CULTURE

## 2011-07-15 NOTE — ED Notes (Signed)
+   Urine culture. Chart sent to EDP office for review 

## 2011-07-17 NOTE — ED Notes (Signed)
Rx for [facility]- [phone] treated Bactrim 1 tab po BID x 7 days writtn by Jaci Carrel called by [facility],PFM

## 2011-07-24 ENCOUNTER — Other Ambulatory Visit: Payer: Self-pay | Admitting: Obstetrics & Gynecology

## 2011-07-30 ENCOUNTER — Encounter (HOSPITAL_COMMUNITY): Payer: Self-pay | Admitting: Pharmacy Technician

## 2011-07-31 ENCOUNTER — Encounter (HOSPITAL_COMMUNITY): Payer: Self-pay | Admitting: *Deleted

## 2011-07-31 ENCOUNTER — Emergency Department (HOSPITAL_COMMUNITY)
Admission: EM | Admit: 2011-07-31 | Discharge: 2011-07-31 | Disposition: A | Discharge status: Home / Self Care | Payer: Medicaid Other | Attending: Emergency Medicine | Admitting: Emergency Medicine

## 2011-07-31 ENCOUNTER — Emergency Department (HOSPITAL_COMMUNITY): Payer: Medicaid Other

## 2011-07-31 DIAGNOSIS — Z79899 Other long term (current) drug therapy: Secondary | ICD-10-CM | POA: Insufficient documentation

## 2011-07-31 DIAGNOSIS — N809 Endometriosis, unspecified: Secondary | ICD-10-CM | POA: Insufficient documentation

## 2011-07-31 DIAGNOSIS — N898 Other specified noninflammatory disorders of vagina: Secondary | ICD-10-CM | POA: Insufficient documentation

## 2011-07-31 DIAGNOSIS — N83209 Unspecified ovarian cyst, unspecified side: Secondary | ICD-10-CM

## 2011-07-31 DIAGNOSIS — F17200 Nicotine dependence, unspecified, uncomplicated: Secondary | ICD-10-CM | POA: Insufficient documentation

## 2011-07-31 DIAGNOSIS — M129 Arthropathy, unspecified: Secondary | ICD-10-CM | POA: Insufficient documentation

## 2011-07-31 DIAGNOSIS — R1032 Left lower quadrant pain: Secondary | ICD-10-CM | POA: Insufficient documentation

## 2011-07-31 LAB — CBC
HCT: 39.6 % (ref 36.0–46.0)
MCHC: 33.1 g/dL (ref 30.0–36.0)
Platelets: 172 10*3/uL (ref 150–400)
RDW: 13.4 % (ref 11.5–15.5)

## 2011-07-31 LAB — URINALYSIS, ROUTINE W REFLEX MICROSCOPIC
Hgb urine dipstick: NEGATIVE
Leukocytes, UA: NEGATIVE
Nitrite: NEGATIVE
Protein, ur: NEGATIVE mg/dL
Urobilinogen, UA: 0.2 mg/dL (ref 0.0–1.0)

## 2011-07-31 LAB — PREGNANCY, URINE: Preg Test, Ur: NEGATIVE

## 2011-07-31 LAB — BASIC METABOLIC PANEL
Calcium: 9.5 mg/dL (ref 8.4–10.5)
Chloride: 106 mEq/L (ref 96–112)
Creatinine, Ser: 0.63 mg/dL (ref 0.50–1.10)
GFR calc Af Amer: >90 mL/min (ref >90)

## 2011-07-31 LAB — DIFFERENTIAL
Basophils Absolute: 0 10*3/uL (ref 0.0–0.1)
Basophils Relative: 0 % (ref 0–1)
Monocytes Absolute: 0.4 10*3/uL (ref 0.1–1.0)
Neutro Abs: 6 10*3/uL (ref 1.7–7.7)

## 2011-07-31 LAB — WET PREP, GENITAL: Trich, Wet Prep: NONE SEEN

## 2011-07-31 MED ORDER — SODIUM CHLORIDE 0.9 % IV BOLUS (SEPSIS)
1000.0000 mL | Freq: Once | INTRAVENOUS | Status: AC
  Administered 2011-07-31: 1000 mL via INTRAVENOUS

## 2011-07-31 MED ORDER — MORPHINE SULFATE 4 MG/ML IJ SOLN
4.0000 mg | Freq: Once | INTRAMUSCULAR | Status: AC
  Administered 2011-07-31: 4 mg via INTRAVENOUS

## 2011-07-31 MED ORDER — MORPHINE SULFATE 4 MG/ML IJ SOLN
INTRAMUSCULAR | Status: AC
  Filled 2011-07-31: qty 1

## 2011-07-31 MED ORDER — ONDANSETRON HCL 4 MG/2ML IJ SOLN
4.0000 mg | Freq: Once | INTRAMUSCULAR | Status: AC
  Administered 2011-07-31: 4 mg via INTRAVENOUS
  Filled 2011-07-31: qty 2

## 2011-07-31 MED ORDER — HYDROMORPHONE HCL PF 1 MG/ML IJ SOLN
1.0000 mg | Freq: Once | INTRAMUSCULAR | Status: AC
  Administered 2011-07-31: 1 mg via INTRAVENOUS
  Filled 2011-07-31: qty 1

## 2011-07-31 MED ORDER — MORPHINE SULFATE 4 MG/ML IJ SOLN
4.0000 mg | Freq: Once | INTRAMUSCULAR | Status: AC
  Administered 2011-07-31: 4 mg via INTRAVENOUS
  Filled 2011-07-31: qty 1

## 2011-07-31 MED ORDER — OXYCODONE-ACETAMINOPHEN 7.5-500 MG PO TABS: 1.0000 | ORAL_TABLET | ORAL | Status: DC | PRN

## 2011-07-31 NOTE — ED Notes (Signed)
MD at bedside. Pelvic cart at bedside. Family present.

## 2011-07-31 NOTE — ED Notes (Signed)
Pt from home c/o LLQ pain that started on Saturday, pt has surgery scheduled on 08/10/11 to remove cyst from L ovary, pt has only 1 ovary, also suffers from endometriosis.

## 2011-07-31 NOTE — ED Provider Notes (Signed)
History     CSN: [id number]  Arrival date & time 07/31/11  1124   First MD Initiated Contact with Patient 07/31/11 1137      Chief Complaint  Patient presents with  . Abdominal Pain    (Consider location/radiation/quality/duration/timing/severity/associated sxs/prior treatment) HPI  History of endometriosis, ovarian cyst lt x2 presents with pelvic pain. The patient states that she is examined worsening left lower quadrant pain since 4 days ago. She states that the pain is constant and rates it as a 9/10. The pain is not been relieved with Percocet, ibuprofen. She denies new or worsening back pain. She claims to have had a fever at home to 102 on Saturday which she's been taking ibuprofen for. He has chronic vaginal discharge which is her baseline. Denies vaginal itching. Denies hematuria/dysuria/freq/urgency. Denies headache, dizziness, URI symptoms. Denies abdominal pain. She's had multiple episodes of nonbilious nonbloody emesis. Scheduled to have cystectomy versus oophorectomy on February 1  Abdominal surgeries include appendectomy, Rt oopherectomy  ED Notes, ED Provider Notes from 07/31/11 0000 to 07/31/11 11:35:03       Luciana Axe, RN 07/31/2011 11:32      Pt from home c/o LLQ pain that started on Saturday, pt has surgery scheduled on 08/10/11 to remove cyst from L ovary, pt has only 1 ovary, also suffers from endometriosis.     Past Medical History  Diagnosis Date  . UTI (lower urinary tract infection)   . Arthritis   . Sciatica   . Endometriosis     Past Surgical History  Procedure Date  . Appendectomy   . Oophorectomy   . Salpingoophorectomy     History reviewed. No pertinent family history.  History  Substance Use Topics  . Smoking status: Current Everyday Smoker -- 2.0 packs/day    Types: Cigarettes  . Smokeless tobacco: Never Used  . Alcohol Use: Yes     occ    OB History    Grav Para Term Preterm Abortions TAB SAB Ect Mult Living                    Review of Systems  All other systems reviewed and are negative.   except as noted HPI   Allergies  Wheat; Ciprofloxacin hcl; and Latex  Home Medications   Current Outpatient Rx  Name Route Sig Dispense Refill  . ALBUTEROL SULFATE HFA 108 (90 BASE) MCG/ACT IN AERS Inhalation Inhale 2 puffs into the lungs every 6 (six) hours as needed. Wheezing    . CLONAZEPAM 0.5 MG PO TABS Oral Take 0.5 mg by mouth 2 (two) times daily as needed. Anxiety    . GABAPENTIN 300 MG PO CAPS Oral Take 300 mg by mouth 3 (three) times daily.     . IBUPROFEN 800 MG PO TABS Oral Take 800 mg by mouth every 8 (eight) hours as needed. Pain    . NORETHINDRONE ACETATE 5 MG PO TABS Oral Take 5 mg by mouth 2 (two) times daily.      . OXYCODONE-ACETAMINOPHEN 5-325 MG PO TABS Oral Take 1 tablet by mouth every 4 (four) hours as needed. Pain    . OXYCODONE-ACETAMINOPHEN 7.5-500 MG PO TABS Oral Take 1 tablet by mouth every 4 (four) hours as needed for pain. 30 tablet 0    BP 125/71  Pulse 85  Temp(Src) 98.3 F (36.8 C) (Oral)  Resp 16  Ht 5\' 6"  (1.676 m)  Wt 126 lb (57.153 kg)  BMI 20.34 kg/m2  SpO2 99%  LMP 07/29/2011  Physical Exam  Nursing note and vitals reviewed. Constitutional: She is oriented to person, place, and time. She appears well-developed.       Crying during exam  HENT:  Head: Atraumatic.  Mouth/Throat: Oropharynx is clear and moist.  Eyes: Conjunctivae and EOM are normal. Pupils are equal, round, and reactive to light.  Neck: Normal range of motion. Neck supple.  Cardiovascular: Regular rhythm, normal heart sounds and intact distal pulses.        tachycardic  Pulmonary/Chest: Effort normal and breath sounds normal. No respiratory distress. She has no wheezes. She has no rales.  Abdominal: Soft. She exhibits no distension. There is tenderness. There is no rebound and no guarding.       +LLQ ttp  Genitourinary:       +vaginal discharge Crying during exam, difficult to assess for  adnexal ttp, CMT  Musculoskeletal: Normal range of motion.  Neurological: She is alert and oriented to person, place, and time.  Skin: Skin is warm and dry. No rash noted.  Psychiatric: She has a normal mood and affect.    ED Course  Procedures (including critical care time)  Labs Reviewed  BASIC METABOLIC PANEL - Abnormal; Notable for the following:    BUN 5 (*)    All other components within normal limits  WET PREP, GENITAL - Abnormal; Notable for the following:    Clue Cells, Wet Prep FEW (*)    WBC, Wet Prep HPF POC FEW (*)    All other components within normal limits  URINALYSIS, ROUTINE W REFLEX MICROSCOPIC  PREGNANCY, URINE  CBC  DIFFERENTIAL  GC/CHLAMYDIA PROBE AMP, GENITAL   US Transvaginal Non-ob  07/31/2011  *RADIOLOGY REPORT*  Clinical Data:  Endometriosis.  Salpingectomy and right oophorectomy.  TRANSABDOMINAL AND TRANSVAGINAL ULTRASOUND OF PELVIS DOPPLER ULTRASOUND OF OVARIES  Technique:  Both transabdominal and transvaginal ultrasound examinations of the pelvis were performed. Transabdominal technique was performed for global imaging of the pelvis including uterus, ovaries, adnexal regions, and pelvic cul-de-sac.  It was necessary to proceed with endovaginal exam following the transabdominal exam to visualize the endometrium.  Color and duplex Doppler ultrasound was utilized to evaluate blood flow to the ovaries.  Comparison:  03/16/2011  Findings:  Uterus:  Measures 7.9 cm in length along the endometrium, and demonstrates bicornuate morphology as on prior workups.  Endometrium:  Measures 8 mm in thickness, within normal limits.  Right ovary: Surgically absent.  Left ovary: Homogeneous complex lesion of the left ovary measuring 4.9 x 3.5 x 3.4 cm likely reflects endometrioma.  There is no color flow within this lesion.  The entirety of the left ovary measures 6.0 x 4.1 x 4.3 cm, with demonstrable arterial and venous waveforms within the ovarian parenchyma.  Pulsed Doppler  evaluation demonstrates normal low-resistance arterial and venous waveforms in the left ovarian parenchyma.  The right ovary is surgically absent.  No free pelvic fluid is observed.  IMPRESSION: 1.  4.9 cm endometrioma or hemorrhagic cyst of the left ovary. 2.  No findings of left ovarian torsion.  The right ovary is surgically absent. 3.  Bicornuate uterus.  Original Report Authenticated By: Dellia Cloud, M.D.   US Pelvis Complete  07/31/2011  *RADIOLOGY REPORT*  Clinical Data:  Endometriosis.  Salpingectomy and right oophorectomy.  TRANSABDOMINAL AND TRANSVAGINAL ULTRASOUND OF PELVIS DOPPLER ULTRASOUND OF OVARIES  Technique:  Both transabdominal and transvaginal ultrasound examinations of the pelvis were performed. Transabdominal technique was performed for global imaging of the pelvis  including uterus, ovaries, adnexal regions, and pelvic cul-de-sac.  It was necessary to proceed with endovaginal exam following the transabdominal exam to visualize the endometrium.  Color and duplex Doppler ultrasound was utilized to evaluate blood flow to the ovaries.  Comparison:  03/16/2011  Findings:  Uterus:  Measures 7.9 cm in length along the endometrium, and demonstrates bicornuate morphology as on prior workups.  Endometrium:  Measures 8 mm in thickness, within normal limits.  Right ovary: Surgically absent.  Left ovary: Homogeneous complex lesion of the left ovary measuring 4.9 x 3.5 x 3.4 cm likely reflects endometrioma.  There is no color flow within this lesion.  The entirety of the left ovary measures 6.0 x 4.1 x 4.3 cm, with demonstrable arterial and venous waveforms within the ovarian parenchyma.  Pulsed Doppler evaluation demonstrates normal low-resistance arterial and venous waveforms in the left ovarian parenchyma.  The right ovary is surgically absent.  No free pelvic fluid is observed.  IMPRESSION: 1.  4.9 cm endometrioma or hemorrhagic cyst of the left ovary. 2.  No findings of left ovarian torsion.   The right ovary is surgically absent. 3.  Bicornuate uterus.  Original Report Authenticated By: Dellia Cloud, M.D.   [place] Art/ven Flow Abd Pelv Doppler  07/31/2011  *RADIOLOGY REPORT*  Clinical Data:  Endometriosis.  Salpingectomy and right oophorectomy.  TRANSABDOMINAL AND TRANSVAGINAL ULTRASOUND OF PELVIS DOPPLER ULTRASOUND OF OVARIES  Technique:  Both transabdominal and transvaginal ultrasound examinations of the pelvis were performed. Transabdominal technique was performed for global imaging of the pelvis including uterus, ovaries, adnexal regions, and pelvic cul-de-sac.  It was necessary to proceed with endovaginal exam following the transabdominal exam to visualize the endometrium.  Color and duplex Doppler ultrasound was utilized to evaluate blood flow to the ovaries.  Comparison:  03/16/2011  Findings:  Uterus:  Measures 7.9 cm in length along the endometrium, and demonstrates bicornuate morphology as on prior workups.  Endometrium:  Measures 8 mm in thickness, within normal limits.  Right ovary: Surgically absent.  Left ovary: Homogeneous complex lesion of the left ovary measuring 4.9 x 3.5 x 3.4 cm likely reflects endometrioma.  There is no color flow within this lesion.  The entirety of the left ovary measures 6.0 x 4.1 x 4.3 cm, with demonstrable arterial and venous waveforms within the ovarian parenchyma.  Pulsed Doppler evaluation demonstrates normal low-resistance arterial and venous waveforms in the left ovarian parenchyma.  The right ovary is surgically absent.  No free pelvic fluid is observed.  IMPRESSION: 1.  4.9 cm endometrioma or hemorrhagic cyst of the left ovary. 2.  No findings of left ovarian torsion.  The right ovary is surgically absent. 3.  Bicornuate uterus.  Original Report Authenticated By: Dellia Cloud, M.D.     1. Hemorrhagic cyst of ovary      MDM  H/o endometriosis, large Lt ovarian cysts pw severe LLQ abdominal pain. DDx incl hemorrhagic cyst, ovarian  torsion, TOA (given fever). Will order pelvic U/S, labs, pain control. Reassess.   Pain improved from previous. U/S as above. Will not treat for BV as patient without sx (chronic moderate discharge). Will increase percocet. Patient advised to call OBGYN for sooner f/u for her scheduled cystectomy and pain control.        Forbes Cellar, MD 07/31/11 1452

## 2011-07-31 NOTE — ED Notes (Signed)
MD at bedside. 

## 2011-08-01 LAB — GC/CHLAMYDIA PROBE AMP, GENITAL: GC Probe Amp, Genital: NEGATIVE

## 2011-08-02 NOTE — ED Notes (Signed)
Chart sent to EDP office for review. DHHS attached

## 2011-08-03 NOTE — ED Notes (Signed)
Give doxycycline 100 mg PO BID X 7 days. Sexual partners must be treated and tested. No sexual activity X 10 days after last person treated. Prescribed by Trixie Dredge.

## 2011-08-07 ENCOUNTER — Encounter (HOSPITAL_COMMUNITY): Payer: Self-pay | Admitting: Obstetrics & Gynecology

## 2011-08-07 NOTE — H&P (Signed)
Cassandra Wall is an 31 y.o. female. The patient carries a diagnosis of endometriosis.  She presents with a likely left-sided endometriotic, ovarian cyst.  She is for a planned robotic-assisted ovarian cystectomy.  HPI:  In 2008, the patient underwent a laparoscopic RSO.appy for an endometriotic cyst.  Recent imaging including U/S, CT scans have documented a persistent, left ovarian cyst approximately 6 cm in diameter.  There is associated pain--the pt takes low potency narcotics.  Pertinent Gynecological History: Menses: with severe dysmenorrhea Bleeding: normal Contraception: Progestin-only Sexually transmitted diseases: recent diagnosis: chlamydia Previous GYN Procedures: see above  Last pap: abnormal: LSIL Date: 12/12    Menstrual History:  No LMP recorded.    Past Medical History  Diagnosis Date  . UTI (lower urinary tract infection)   . Arthritis   . Sciatica   . Endometriosis     Past Surgical History  Procedure Date  . Appendectomy   . Oophorectomy   . Salpingoophorectomy     History reviewed. No pertinent family history.  Social History:  reports that she has been smoking Cigarettes.  She has been smoking about 2 packs per day. She has never used smokeless tobacco. She reports that she drinks alcohol. She reports that she uses illicit drugs (Marijuana) about twice per week.  Allergies:  Allergies  Allergen Reactions  . Wheat Anaphylaxis  . Ciprofloxacin Hcl     Reports as resistant   . Latex Hives, Itching and Rash    No prescriptions prior to admission    Review of Systems  Constitutional: Negative for fever.  Eyes: Negative for blurred vision.  Respiratory: Negative for shortness of breath.   Gastrointestinal: Negative for nausea and vomiting.  Genitourinary: Positive for flank pain.  Skin: Negative for rash.  Neurological: Negative for headaches.    There were no vitals taken for this visit. Physical Exam  Constitutional: She appears  well-developed.  HENT:  Head: Normocephalic.  Neck: Neck supple. No thyromegaly present.  Cardiovascular: Normal rate and regular rhythm.   Respiratory: Breath sounds normal.  GI: Soft. Bowel sounds are normal. There is tenderness in the left lower quadrant.  Skin: No rash noted.    No results found for this or any previous visit (from the past 24 hour(s)).  No results found.  Assessment/Plan: Likely left-sided, ovarian, endometriotic cyst.  Symptomatic--secondary pelvic pain.  An attempted robotic-assisted ovarian cystectomy is planned.  Possible laparotomy.  Risks reviewed including but not limited hemorrhage, infection, thromboembolic complications, inadvertent injury to abdominal/pelvic viscera   JACKSON-MOORE,Cassandra Wall A 08/07/2011, 2:37 PM

## 2011-08-08 ENCOUNTER — Encounter (HOSPITAL_COMMUNITY): Payer: Self-pay

## 2011-08-08 ENCOUNTER — Encounter (HOSPITAL_COMMUNITY)
Admission: RE | Admit: 2011-08-08 | Discharge: 2011-08-08 | Disposition: A | Discharge status: Home / Self Care | Payer: Medicaid Other | Source: Ambulatory Visit | Attending: Obstetrics & Gynecology | Admitting: Obstetrics & Gynecology

## 2011-08-08 HISTORY — DX: Anemia, unspecified: D64.9

## 2011-08-08 HISTORY — DX: Adverse effect of unspecified anesthetic, initial encounter: T41.45XA

## 2011-08-08 HISTORY — DX: Other complications of anesthesia, initial encounter: T88.59XA

## 2011-08-08 HISTORY — DX: Anxiety disorder, unspecified: F41.9

## 2011-08-08 HISTORY — DX: Chronic kidney disease, unspecified: N18.9

## 2011-08-08 LAB — CBC
Hemoglobin: 13.3 g/dL (ref 12.0–15.0)
MCH: 30.6 pg (ref 26.0–34.0)
MCHC: 32.4 g/dL (ref 30.0–36.0)
Platelets: 166 10*3/uL (ref 150–400)
RDW: 13.2 % (ref 11.5–15.5)

## 2011-08-08 LAB — HCG, SERUM, QUALITATIVE: Preg, Serum: NEGATIVE

## 2011-08-08 LAB — ABO/RH: ABO/RH(D): O POS

## 2011-08-08 LAB — SURGICAL PCR SCREEN: MRSA, PCR: NEGATIVE

## 2011-08-08 NOTE — Patient Instructions (Signed)
21 Cassandra Wall  08/08/2011   Your procedure is scheduled on:  08/10/11 1191-4782  Report to [facility] at 0530 AM.  Call this number if you have problems the morning of surgery: [phone]   Remember:   Do not eat food:After Midnight.  May have clear liquids:until Midnight .    Take these medicines the morning of surgery with A SIP OF WATER   Do not wear jewelry, make-up or nail polish.  Do not wear lotions, powders, or perfumes.   Do not shave 48 hours prior to surgery.  Do not bring valuables to the hospital.  Contacts, dentures or bridgework may not be worn into surgery.       Patients discharged the day of surgery will not be allowed to drive home.  Name and phone number of your driver:   Special Instructions: CHG Shower Use Special Wash: 1/2 bottle night before surgery and 1/2 bottle morning of surgery. Shower chin to toes with CHG.  Wash face and private parts with regular soap.    Please read over the following fact sheets that you were given: MRSA Information, Blood Transfusion Fact Sheet, coughing and deep breathing exercises, leg exercises

## 2011-08-09 LAB — RPR: RPR Ser Ql: NONREACTIVE

## 2011-08-09 NOTE — Pre-Procedure Instructions (Signed)
08/08/11 Lab results of RPR and rapid HIV screen were sent out per Debbie in Lab.  She stated lab results would be back on 08/09/11.   08/09/11 lab results of RPR and rapid HIV screen are not resulted in computer as of 1530pm.  I called Debbie in main lab and she stated she did not see results either.  Customer service number of [phone] was given to nurse to call at [facility] to check on lab results.  Nurse called customer service number and I was told RPR as of 1530pm had not been resulted yet but would be back on 08/09/11.  [facility] stated they saw no Rapid HIV screen ordered.  I called Debbie at [employer] back and she stated she saw no order for that in computer and I can see the order in EPIC.  I reentered the order for Rapid HIV screen and Eunice Blase stated she would call the [facility] and have them run the Rapid HIV screen off the blood they had from the RPR .  Debbie called me back at 1545 pm to inform the nurse there was not enough blood to do that at [facility].  Nurse told Eunice Blase that should would reenter the lab to be drawn when pt arrives for surgery on 08/10/11.  Nurse reentered the lab.

## 2011-08-10 ENCOUNTER — Ambulatory Visit (HOSPITAL_COMMUNITY): Payer: Medicaid Other | Admitting: Anesthesiology

## 2011-08-10 ENCOUNTER — Encounter (HOSPITAL_COMMUNITY): Payer: Self-pay | Admitting: Anesthesiology

## 2011-08-10 ENCOUNTER — Ambulatory Visit (HOSPITAL_COMMUNITY)
Admission: RE | Admit: 2011-08-10 | Discharge: 2011-08-10 | Disposition: A | Discharge status: Home / Self Care | Payer: Medicaid Other | Source: Ambulatory Visit | Attending: Obstetrics & Gynecology | Admitting: Obstetrics & Gynecology

## 2011-08-10 ENCOUNTER — Other Ambulatory Visit: Payer: Self-pay | Admitting: Obstetrics & Gynecology

## 2011-08-10 ENCOUNTER — Encounter (HOSPITAL_COMMUNITY)
Admission: RE | Disposition: A | Discharge status: Home / Self Care | Payer: Self-pay | Source: Ambulatory Visit | Attending: Obstetrics & Gynecology

## 2011-08-10 ENCOUNTER — Encounter (HOSPITAL_COMMUNITY): Payer: Self-pay | Admitting: *Deleted

## 2011-08-10 DIAGNOSIS — R109 Unspecified abdominal pain: Secondary | ICD-10-CM | POA: Insufficient documentation

## 2011-08-10 DIAGNOSIS — N801 Endometriosis of ovary: Secondary | ICD-10-CM | POA: Insufficient documentation

## 2011-08-10 DIAGNOSIS — N83209 Unspecified ovarian cyst, unspecified side: Secondary | ICD-10-CM | POA: Insufficient documentation

## 2011-08-10 DIAGNOSIS — Q513 Bicornate uterus: Secondary | ICD-10-CM | POA: Insufficient documentation

## 2011-08-10 DIAGNOSIS — Z01812 Encounter for preprocedural laboratory examination: Secondary | ICD-10-CM | POA: Insufficient documentation

## 2011-08-10 DIAGNOSIS — N80109 Endometriosis of ovary, unspecified side, unspecified depth: Secondary | ICD-10-CM | POA: Insufficient documentation

## 2011-08-10 LAB — RAPID HIV SCREEN (WH-MAU): Rapid HIV Screen: NONREACTIVE

## 2011-08-10 SURGERY — ROBOTIC ASSISTED LAPAROSCOPIC OVARIAN CYSTECTOMY
Anesthesia: General | Laterality: Left | Wound class: Clean Contaminated

## 2011-08-10 MED ORDER — LACTATED RINGERS IV SOLN: INTRAVENOUS | Status: DC

## 2011-08-10 MED ORDER — LACTATED RINGERS IR SOLN
Status: DC | PRN
  Administered 2011-08-10: 1000 mL

## 2011-08-10 MED ORDER — DROPERIDOL 2.5 MG/ML IJ SOLN
INTRAMUSCULAR | Status: DC | PRN
  Administered 2011-08-10: 0.625 mg via INTRAVENOUS

## 2011-08-10 MED ORDER — HYDROMORPHONE HCL PF 1 MG/ML IJ SOLN
INTRAMUSCULAR | Status: DC | PRN
  Administered 2011-08-10 (×2): 1 mg via INTRAVENOUS

## 2011-08-10 MED ORDER — HYDROMORPHONE HCL PF 1 MG/ML IJ SOLN
INTRAMUSCULAR | Status: AC
  Filled 2011-08-10: qty 1

## 2011-08-10 MED ORDER — ONDANSETRON HCL 4 MG/2ML IJ SOLN
INTRAMUSCULAR | Status: DC | PRN
  Administered 2011-08-10: 4 mg via INTRAVENOUS

## 2011-08-10 MED ORDER — PROMETHAZINE HCL 25 MG/ML IJ SOLN: 6.2500 mg | INTRAMUSCULAR | Status: DC | PRN

## 2011-08-10 MED ORDER — NEOSTIGMINE METHYLSULFATE 1 MG/ML IJ SOLN
INTRAMUSCULAR | Status: DC | PRN
  Administered 2011-08-10: 3 mg via INTRAVENOUS

## 2011-08-10 MED ORDER — FENTANYL CITRATE 0.05 MG/ML IJ SOLN
INTRAMUSCULAR | Status: DC | PRN
  Administered 2011-08-10 (×9): 50 ug via INTRAVENOUS

## 2011-08-10 MED ORDER — CEFAZOLIN SODIUM 1-5 GM-% IV SOLN
1.0000 g | INTRAVENOUS | Status: AC
  Administered 2011-08-10: 1 g via INTRAVENOUS

## 2011-08-10 MED ORDER — MEPERIDINE HCL 25 MG/ML IJ SOLN: 6.2500 mg | INTRAMUSCULAR | Status: DC | PRN

## 2011-08-10 MED ORDER — CEFAZOLIN SODIUM 1-5 GM-% IV SOLN
INTRAVENOUS | Status: AC
  Filled 2011-08-10: qty 50

## 2011-08-10 MED ORDER — HYDROMORPHONE HCL PF 1 MG/ML IJ SOLN
0.2500 mg | INTRAMUSCULAR | Status: DC | PRN
  Administered 2011-08-10: 0.5 mg via INTRAVENOUS
  Administered 2011-08-10 (×2): 0.25 mg via INTRAVENOUS

## 2011-08-10 MED ORDER — BUPIVACAINE HCL (PF) 0.25 % IJ SOLN
INTRAMUSCULAR | Status: AC
  Filled 2011-08-10: qty 30

## 2011-08-10 MED ORDER — BUPIVACAINE HCL (PF) 0.25 % IJ SOLN
INTRAMUSCULAR | Status: DC | PRN
  Administered 2011-08-10: 7 mL

## 2011-08-10 MED ORDER — OXYCODONE-ACETAMINOPHEN 5-325 MG PO TABS
ORAL_TABLET | ORAL | Status: AC
  Administered 2011-08-10: 2
  Filled 2011-08-10: qty 2

## 2011-08-10 MED ORDER — DEXAMETHASONE SODIUM PHOSPHATE 10 MG/ML IJ SOLN
INTRAMUSCULAR | Status: DC | PRN
  Administered 2011-08-10: 10 mg via INTRAVENOUS

## 2011-08-10 MED ORDER — MIDAZOLAM HCL 5 MG/5ML IJ SOLN
INTRAMUSCULAR | Status: DC | PRN
  Administered 2011-08-10: 2 mg via INTRAVENOUS

## 2011-08-10 MED ORDER — LACTATED RINGERS IV SOLN
INTRAVENOUS | Status: DC | PRN
  Administered 2011-08-10 (×2): via INTRAVENOUS

## 2011-08-10 MED ORDER — OXYCODONE-ACETAMINOPHEN 5-325 MG PO TABS: 2.0000 | ORAL_TABLET | Freq: Four times a day (QID) | ORAL | Status: AC | PRN

## 2011-08-10 MED ORDER — PROPOFOL 10 MG/ML IV EMUL
INTRAVENOUS | Status: DC | PRN
  Administered 2011-08-10: 200 mg via INTRAVENOUS

## 2011-08-10 MED ORDER — GLYCOPYRROLATE 0.2 MG/ML IJ SOLN
INTRAMUSCULAR | Status: DC | PRN
  Administered 2011-08-10: .5 mg via INTRAVENOUS

## 2011-08-10 MED ORDER — ROCURONIUM BROMIDE 100 MG/10ML IV SOLN
INTRAVENOUS | Status: DC | PRN
  Administered 2011-08-10: 10 mg via INTRAVENOUS
  Administered 2011-08-10: 50 mg via INTRAVENOUS

## 2011-08-10 MED ORDER — ACETAMINOPHEN 10 MG/ML IV SOLN
INTRAVENOUS | Status: AC
  Filled 2011-08-10: qty 100

## 2011-08-10 MED ORDER — LIDOCAINE HCL (CARDIAC) 20 MG/ML IV SOLN
INTRAVENOUS | Status: DC | PRN
  Administered 2011-08-10: 50 mg via INTRAVENOUS

## 2011-08-10 MED ORDER — STERILE WATER FOR IRRIGATION IR SOLN
Status: DC | PRN
  Administered 2011-08-10: 2000 mL

## 2011-08-10 MED ORDER — METOCLOPRAMIDE HCL 5 MG/ML IJ SOLN
INTRAMUSCULAR | Status: DC | PRN
  Administered 2011-08-10: 10 mg via INTRAVENOUS

## 2011-08-10 MED ORDER — ACETAMINOPHEN 10 MG/ML IV SOLN
INTRAVENOUS | Status: DC | PRN
  Administered 2011-08-10: 1000 mg via INTRAVENOUS

## 2011-08-10 SURGICAL SUPPLY — 63 items
BENZOIN TINCTURE PRP APPL 2/3 (GAUZE/BANDAGES/DRESSINGS) ×2 IMPLANT
CATH FOLEY LATEX FREE 16FR (CATHETERS) ×2 IMPLANT
CHLORAPREP W/TINT 10.5 ML (MISCELLANEOUS) ×2 IMPLANT
CLOTH BEACON ORANGE TIMEOUT ST (SAFETY) ×2 IMPLANT
CORDS BIPOLAR (ELECTRODE) ×2 IMPLANT
COVER MAYO STAND STRL (DRAPES) ×2 IMPLANT
COVER SURGICAL LIGHT HANDLE (MISCELLANEOUS) ×2 IMPLANT
COVER TIP SHEARS 8 DVNC (MISCELLANEOUS) ×1 IMPLANT
COVER TIP SHEARS 8MM DA VINCI (MISCELLANEOUS) ×1
DECANTER SPIKE VIAL GLASS SM (MISCELLANEOUS) IMPLANT
DERMABOND ADVANCED (GAUZE/BANDAGES/DRESSINGS) ×1
DERMABOND ADVANCED .7 DNX12 (GAUZE/BANDAGES/DRESSINGS) ×1 IMPLANT
DRAPE INSTRUMENT ARM DA VINCI (DRAPES)
DRAPE INSTRUMENT ARM DVNC (DRAPES) IMPLANT
DRAPE LG THREE QUARTER DISP (DRAPES) ×4 IMPLANT
DRAPE SURG IRRIG POUCH 19X23 (DRAPES) ×2 IMPLANT
DRAPE TABLE BACK 44X90 PK DISP (DRAPES) ×4 IMPLANT
DRAPE WARM FLUID 44X44 (DRAPE) ×2 IMPLANT
DRSG TEGADERM 6X8 (GAUZE/BANDAGES/DRESSINGS) IMPLANT
ELECT REM PT RETURN 9FT ADLT (ELECTROSURGICAL) ×2
ELECTRODE REM PT RTRN 9FT ADLT (ELECTROSURGICAL) ×1 IMPLANT
FILTER SMOKE EVAC LAPAROSHD (FILTER) IMPLANT
GAUZE SPONGE 2X2 8PLY STRL LF (GAUZE/BANDAGES/DRESSINGS) ×1 IMPLANT
GAUZE VASELINE 3X9 (GAUZE/BANDAGES/DRESSINGS) IMPLANT
GLOVE BIOGEL M 6.5 STRL (GLOVE) IMPLANT
GLOVE SURG SS PI 6.5 STRL IVOR (GLOVE) ×6 IMPLANT
GLOVE SURG SS PI 7.0 STRL IVOR (GLOVE) ×4 IMPLANT
GLOVE SURG SS PI 8.0 STRL IVOR (GLOVE) ×2 IMPLANT
GOWN BRE IMP PREV XXLGXLNG (GOWN DISPOSABLE) ×2 IMPLANT
GOWN STRL NON-REIN LRG LVL3 (GOWN DISPOSABLE) ×4 IMPLANT
KIT ACCESSORY DA VINCI DISP (KITS) ×1
KIT ACCESSORY DVNC DISP (KITS) ×1 IMPLANT
LUBRICANT JELLY K Y 4OZ (MISCELLANEOUS) IMPLANT
MANIPULATOR UTERINE 4.5 ZUMI (MISCELLANEOUS) ×2 IMPLANT
OCCLUDER COLPOPNEUMO (BALLOONS) IMPLANT
PACK LAPAROSCOPY W LONG (CUSTOM PROCEDURE TRAY) ×2 IMPLANT
PENCIL BUTTON HOLSTER BLD 10FT (ELECTRODE) IMPLANT
POSITIONER SURGICAL ARM (MISCELLANEOUS) IMPLANT
POUCH SPECIMEN RETRIEVAL 10MM (ENDOMECHANICALS) ×2 IMPLANT
SET TUBE IRRIG SUCTION NO TIP (IRRIGATION / IRRIGATOR) ×2 IMPLANT
SHEET LAVH (DRAPES) ×2 IMPLANT
SLEEVE SURGEON STRL (DRAPES) ×2 IMPLANT
SOLUTION ELECTROLUBE (MISCELLANEOUS) ×2 IMPLANT
SPONGE GAUZE 2X2 STER 10/PKG (GAUZE/BANDAGES/DRESSINGS) ×1
SPONGE LAP 18X18 X RAY DECT (DISPOSABLE) IMPLANT
STRIP CLOSURE SKIN 1/2X4 (GAUZE/BANDAGES/DRESSINGS) ×2 IMPLANT
SUT VIC AB 0 CT1 27 (SUTURE) ×1
SUT VIC AB 0 CT1 27XBRD ANTBC (SUTURE) ×1 IMPLANT
SUT VIC AB 4-0 PS2 27 (SUTURE) ×4 IMPLANT
SUT VICRYL 0 UR6 27IN ABS (SUTURE) ×2 IMPLANT
SYR 20CC LL (SYRINGE) ×4 IMPLANT
SYR 50ML LL SCALE MARK (SYRINGE) ×2 IMPLANT
SYR BULB IRRIGATION 50ML (SYRINGE) IMPLANT
TIP UTERINE 5.1X6CM LAV DISP (MISCELLANEOUS) IMPLANT
TIP UTERINE 6.7X10CM GRN DISP (MISCELLANEOUS) IMPLANT
TIP UTERINE 6.7X6CM WHT DISP (MISCELLANEOUS) IMPLANT
TIP UTERINE 6.7X8CM BLUE DISP (MISCELLANEOUS) IMPLANT
TOWEL OR 17X26 10 PK STRL BLUE (TOWEL DISPOSABLE) ×4 IMPLANT
TRAY FOLEY CATH 14FRSI W/METER (CATHETERS) IMPLANT
TROCAR 12M 150ML BLUNT (TROCAR) ×2 IMPLANT
TROCAR HASSON GELL 12X100 (TROCAR) IMPLANT
TROCAR XCEL 12X100 BLDLESS (ENDOMECHANICALS) ×2 IMPLANT
WATER STERILE IRR 1500ML POUR (IV SOLUTION) ×4 IMPLANT

## 2011-08-10 NOTE — Anesthesia Postprocedure Evaluation (Signed)
  Anesthesia Post-op Note  Patient: Cassandra Wall  Procedure(s) Performed:  ROBOTIC ASSISTED LAPAROSCOPIC OVARIAN CYSTECTOMY - ROBOTIC ASSISTED OVARIAN CYSTECTOMY  Patient Location: PACU  Anesthesia Type: General  Level of Consciousness: awake and alert   Airway and Oxygen Therapy: Patient Spontanous Breathing  Post-op Pain: mild  Post-op Assessment: Post-op Vital signs reviewed, Patient's Cardiovascular Status Stable, Respiratory Function Stable, Patent Airway and No signs of Nausea or vomiting  Post-op Vital Signs: stable  Complications: No apparent anesthesia complications

## 2011-08-10 NOTE — Op Note (Signed)
Pre-operative Diagnosis: Left ovarian cyst, radiologic features suspect for an endometrioma  Post-operative Diagnosis: Left-sided endometrioma Operation: Robotic-assisted ovarian cystectomy Surgeon: Roseanna Rainbow  Assistant: Coral Ceo, MD  Anesthesia: GET  Urine Output:  per Anesthesiology  Findings: Left-sided ovarian cyst approximately 6 cm in diameter, contents chocolate-like fluid.  Small, <5 mm powder-burn lesions on the uterine serosa.  Bicornuate uterus.  Filmy adhesions, fibrotic areas in the anterior CDS.  Right ovary absent.  Estimated Blood Loss:   per Anesthesiology                 Total IV Fluids:  per Anesthesiology         Specimens: PATHOLOGY               Complications:  None; patient tolerated the procedure well.         Disposition: PACU - hemodynamically stable.         Condition: Stable    Procedure Details  The patient was seen in the Holding Room. The risks, benefits, complications, treatment options, and expected outcomes were discussed with the patient.  The patient concurred with the proposed plan, giving informed consent.  The site of surgery properly noted/marked. The patient was identified as Cassandra Wall and the procedure verified as a Robotic-assisted ovarian cystectomy. A Time Out was held and the above information confirmed.  After induction of anesthesia, the patient was draped and prepped in the usual sterile manner. Pt was placed in supine position after anesthesia and draped and prepped in the usual sterile manner. The abdominal drape was placed after the CholoraPrep had been allowed to dry for 3 minutes.  Her arms were tucked to her side with all appropriate precautions.    The patient was placed in the semi-lithotomy position in [place] stirrups.  The perineum was prepped with Betadine.  Foley catheter was placed.  A sterile speculum was placed in the vagina.  The cervix was grasped with a single-tooth tenaculum and dilated  with Shawnie Pons dilators.  The ZUMI uterine manipulator was placed without difficulty.   OG tube placement was confirmed and to suction.  Approximately 2 cm below the costal margin, in the midclavicular line the skin was anesthestized with 0.25% Marcaine.  A 5 mm incision was made and using a 5 mm Optiview, a 5 mm trocar was placed under direct vision.  The patient's abdomen was insufflated with CO2 gas.  At this point and all points during the procedure, the patient's intra-abdominal pressure did not exceed 15 mmHg.  A 10-12 camera port was placed 24 cm above the pubic symphysis.  Bilateral 8 mm ports were place 10 cm and 15 degrees inferior.  All ports were placed under direct visualization.  The 5 mm port was removed.  The incision was extended to accommodate a 10 mm trocar.  A 10 mm trocar and sleeve were advanced under direct visualization.   The robot was docked in the usual fashion. The left utero-ovarian ligament was stabilized with a grasper.  The ovarian cortex was incised over the most dependent area, in the axis of th ovary The cyst wall was grasped and blunt dissection used to separate the cyst wall from the ovarian cortex.  During the dissection, the cyst was ruptured.  Bleeding points in the cyst bed were cauterized.  The cyst bed was irrigated and felt to be hemostatic--it was left to heal by secondary intention.   The instruments were then removed under direct visualization and the robotic ports  removed.  The robot was undocked.  Deep, subcutaneous, figure-of-eight 0-Vicryl sutures on a UR-6 needle were placed in the 10-12 mm supraumbilical and infracostal incisions.  All skin incisions were closed in a subcuticular fashion using 3-0 Monocryl.  Steri-strips and Benzoin were applied.    All instrument and needle counts were correct x  2.

## 2011-08-10 NOTE — Interval H&P Note (Signed)
History and Physical Interval Note:  08/10/2011 7:14 AM  Cassandra Wall  has presented today for surgery, with the diagnosis of endometriosis  The various methods of treatment have been discussed with the patient and family. After consideration of risks, benefits and other options for treatment, the patient has consented to  Procedure(s): ROBOTIC ASSISTED LAPAROSCOPIC OVARIAN CYSTECTOMY as a surgical intervention .  The patients' history has been reviewed, patient examined, no change in status, stable for surgery.  I have reviewed the patients' chart and labs.  Questions were answered to the patient's satisfaction.     JACKSON-MOORE,Kimothy Kishimoto A

## 2011-08-10 NOTE — Anesthesia Preprocedure Evaluation (Addendum)
Anesthesia Evaluation  Patient identified by MRN, date of birth, ID band Patient awake    Reviewed: Allergy & Precautions, H&P , NPO status , Patient's Chart, lab work & pertinent test results  History of Anesthesia Complications Negative for: history of anesthetic complications  Airway Mallampati: II TM Distance: >3 FB Neck ROM: Full    Dental No notable dental hx.    Pulmonary neg pulmonary ROS, Current Smoker,  clear to auscultation  Pulmonary exam normal       Cardiovascular neg cardio ROS Regular Normal    Neuro/Psych Negative Neurological ROS  Negative Psych ROS   GI/Hepatic negative GI ROS, Neg liver ROS,   Endo/Other  Negative Endocrine ROS  Renal/GU negative Renal ROS  Genitourinary negative   Musculoskeletal negative musculoskeletal ROS (+)   Abdominal   Peds negative pediatric ROS (+)  Hematology negative hematology ROS (+)   Anesthesia Other Findings   Reproductive/Obstetrics negative OB ROS                          Anesthesia Physical Anesthesia Plan  ASA: II  Anesthesia Plan: General   Post-op Pain Management:    Induction: Intravenous  Airway Management Planned: Oral ETT  Additional Equipment:   Intra-op Plan:   Post-operative Plan: Extubation in OR  Informed Consent: I have reviewed the patients History and Physical, chart, labs and discussed the procedure including the risks, benefits and alternatives for the proposed anesthesia with the patient or authorized representative who has indicated his/her understanding and acceptance.   Dental advisory given  Plan Discussed with: CRNA  Anesthesia Plan Comments:         Anesthesia Quick Evaluation

## 2011-08-10 NOTE — Preoperative (Signed)
Beta Blockers   Reason not to administer Beta Blockers:Not Applicable 

## 2011-08-10 NOTE — Transfer of Care (Signed)
Immediate Anesthesia Transfer of Care Note  Patient: Cassandra Wall  Procedure(s) Performed:  ROBOTIC ASSISTED LAPAROSCOPIC OVARIAN CYSTECTOMY - ROBOTIC ASSISTED OVARIAN CYSTECTOMY  Patient Location: PACU  Anesthesia Type: General  Level of Consciousness: awake, alert  and oriented  Airway & Oxygen Therapy: Patient Spontanous Breathing and Patient connected to face mask oxygen  Post-op Assessment: Report given to PACU RN and Post -op Vital signs reviewed and stable  Post vital signs: Reviewed and stable  Complications: No apparent anesthesia complications

## 2011-10-03 ENCOUNTER — Encounter (HOSPITAL_BASED_OUTPATIENT_CLINIC_OR_DEPARTMENT_OTHER): Payer: Self-pay

## 2011-10-03 ENCOUNTER — Emergency Department (HOSPITAL_BASED_OUTPATIENT_CLINIC_OR_DEPARTMENT_OTHER)
Admission: EM | Admit: 2011-10-03 | Discharge: 2011-10-03 | Disposition: A | Discharge status: Home Health | Payer: Medicaid Other | Attending: Emergency Medicine | Admitting: Emergency Medicine

## 2011-10-03 DIAGNOSIS — M5137 Other intervertebral disc degeneration, lumbosacral region: Secondary | ICD-10-CM | POA: Insufficient documentation

## 2011-10-03 DIAGNOSIS — K0889 Other specified disorders of teeth and supporting structures: Secondary | ICD-10-CM

## 2011-10-03 DIAGNOSIS — J029 Acute pharyngitis, unspecified: Secondary | ICD-10-CM | POA: Insufficient documentation

## 2011-10-03 DIAGNOSIS — F411 Generalized anxiety disorder: Secondary | ICD-10-CM | POA: Insufficient documentation

## 2011-10-03 DIAGNOSIS — D649 Anemia, unspecified: Secondary | ICD-10-CM | POA: Insufficient documentation

## 2011-10-03 DIAGNOSIS — F17200 Nicotine dependence, unspecified, uncomplicated: Secondary | ICD-10-CM | POA: Insufficient documentation

## 2011-10-03 DIAGNOSIS — M51379 Other intervertebral disc degeneration, lumbosacral region without mention of lumbar back pain or lower extremity pain: Secondary | ICD-10-CM | POA: Insufficient documentation

## 2011-10-03 DIAGNOSIS — K089 Disorder of teeth and supporting structures, unspecified: Secondary | ICD-10-CM | POA: Insufficient documentation

## 2011-10-03 MED ORDER — OXYCODONE-ACETAMINOPHEN 5-325 MG PO TABS
1.0000 | ORAL_TABLET | Freq: Once | ORAL | Status: AC
  Administered 2011-10-03: 1 via ORAL
  Filled 2011-10-03: qty 1

## 2011-10-03 MED ORDER — HYDROCODONE-ACETAMINOPHEN 7.5-500 MG/15ML PO SOLN: 15.0000 mL | Freq: Four times a day (QID) | ORAL | Status: AC | PRN

## 2011-10-03 MED ORDER — PENICILLIN V POTASSIUM 500 MG PO TABS: 500.0000 mg | ORAL_TABLET | Freq: Four times a day (QID) | ORAL | Status: AC
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
## 2011-10-03 NOTE — ED Notes (Signed)
Pt c/o Upper L dental pain following filling falling out 3 days ago.  Pt states tooth is very sensitive.  Pt also c/o sore throat with productive cough with yellow/green sputum for past 2-3 days.  Pt states she has had fever, taking Ibuprofen..last dose around midnight.

## 2011-10-03 NOTE — Discharge Instructions (Signed)
Dental Pain Toothache is pain in or around a tooth. It may get worse with chewing or with cold or heat.  HOME CARE  Your dentist may use a numbing medicine during treatment. If so, you may need to avoid eating until the medicine wears off. Ask your dentist about this.   Only take medicine as told by your dentist or doctor.   Avoid chewing food near the painful tooth until after all treatment is done. Ask your dentist about this.  GET HELP RIGHT AWAY IF:   The problem gets worse or new problems appear.   You have a fever.   There is redness and puffiness (swelling) of the face, jaw, or neck.   You cannot open your mouth.   There is pain in the jaw.   There is very bad pain that is not helped by medicine.  MAKE SURE YOU:   Understand these instructions.   Will watch your condition.   Will get help right away if you are not doing well or get worse.  Document Released: 12/12/2007 Document Revised: 06/14/2011 Document Reviewed: 12/12/2007 Exit[employer] Patient Information 2012 [employer]. 

## 2011-10-09 ENCOUNTER — Encounter (HOSPITAL_BASED_OUTPATIENT_CLINIC_OR_DEPARTMENT_OTHER): Payer: Self-pay | Admitting: *Deleted

## 2011-10-09 ENCOUNTER — Emergency Department (HOSPITAL_BASED_OUTPATIENT_CLINIC_OR_DEPARTMENT_OTHER)
Admission: EM | Admit: 2011-10-09 | Discharge: 2011-10-09 | Disposition: A | Discharge status: Home / Self Care | Payer: Medicaid Other | Attending: Emergency Medicine | Admitting: Emergency Medicine

## 2011-10-09 DIAGNOSIS — Z8742 Personal history of other diseases of the female genital tract: Secondary | ICD-10-CM | POA: Insufficient documentation

## 2011-10-09 DIAGNOSIS — M51379 Other intervertebral disc degeneration, lumbosacral region without mention of lumbar back pain or lower extremity pain: Secondary | ICD-10-CM | POA: Insufficient documentation

## 2011-10-09 DIAGNOSIS — M5137 Other intervertebral disc degeneration, lumbosacral region: Secondary | ICD-10-CM | POA: Insufficient documentation

## 2011-10-09 DIAGNOSIS — F17200 Nicotine dependence, unspecified, uncomplicated: Secondary | ICD-10-CM | POA: Insufficient documentation

## 2011-10-09 DIAGNOSIS — F411 Generalized anxiety disorder: Secondary | ICD-10-CM | POA: Insufficient documentation

## 2011-10-09 DIAGNOSIS — K029 Dental caries, unspecified: Secondary | ICD-10-CM | POA: Insufficient documentation

## 2011-10-09 DIAGNOSIS — R05 Cough: Secondary | ICD-10-CM | POA: Insufficient documentation

## 2011-10-09 DIAGNOSIS — R059 Cough, unspecified: Secondary | ICD-10-CM | POA: Insufficient documentation

## 2011-10-09 MED ORDER — OXYCODONE-ACETAMINOPHEN 5-325 MG PO TABS: 1.0000 | ORAL_TABLET | ORAL | Status: AC | PRN

## 2011-10-09 MED ORDER — BUPIVACAINE-EPINEPHRINE PF 0.5-1:200000 % IJ SOLN
INTRAMUSCULAR | Status: AC
  Administered 2011-10-09: 08:00:00
  Filled 2011-10-09: qty 1.8

## 2011-10-09 NOTE — ED Notes (Signed)
Pt amb to room 6 with quick steady gait in nad. Pt is teary, states that she has had tooth pain x 3 weeks, seen here and rx pain meds with referral to dentist, states she is unable to get apt. With dentist.

## 2011-10-09 NOTE — Discharge Instructions (Signed)
Dental Care and Dentist Visits   Dental care supports good overall health. Regular dental visits can also help you avoid dental pain, bleeding, infection, and other more serious health problems in the future. It is important to keep the mouth healthy because diseases in the teeth, gums, and other oral tissues can spread to other areas of the body. Some problems, such as diabetes, heart disease, and pre-term labor have been associated with poor oral health.   See your dentist every 6 months. If you experience emergency problems such as a toothache or broken tooth, go to the dentist right away. If you see your dentist regularly, you may catch problems early. It is easier to be treated for problems in the early stages.   WHAT TO EXPECT AT A DENTIST VISIT   Your dentist will look for many common oral health problems and recommend proper treatment. At your regular dental visit, you can expect:   Gentle cleaning of the teeth and gums. This includes scraping and polishing. This helps to remove the sticky substance around the teeth and gums (plaque). Plaque forms in the mouth shortly after eating. Over time, plaque hardens on the teeth as tartar. If tartar is not removed regularly, it can cause problems. Cleaning also helps remove stains.   Periodic X-rays. These pictures of the teeth and supporting bone will help your dentist assess the health of your teeth.   Periodic fluoride treatments. Fluoride is a natural mineral shown to help strengthen teeth. Fluoride treatment involves applying a fluoride gel or varnish to the teeth. It is most commonly done in children.   Examination of the mouth, tongue, jaws, teeth, and gums to look for any oral health problems, such as:   Cavities (dental caries). This is decay on the tooth caused by plaque, sugar, and acid in the mouth. It is best to catch a cavity when it is small.   Inflammation of the gums caused by plaque buildup (gingivitis).   Problems with the mouth or malformed or  misaligned teeth.   Oral cancer or other diseases of the soft tissues or jaws.   KEEP YOUR TEETH AND GUMS HEALTHY   For healthy teeth and gums, follow these general guidelines as well as your dentist's specific advice:   Have your teeth professionally cleaned at the dentist every 6 months.   Brush twice daily with a fluoride toothpaste.   Floss your teeth daily.   Ask your dentist if you need fluoride supplements, treatments, or fluoride toothpaste.   Eat a healthy diet. Reduce foods and drinks with added sugar.   Avoid smoking.  TREATMENT FOR ORAL HEALTH PROBLEMS   If you have oral health problems, treatment varies depending on the conditions present in your teeth and gums.   Your caregiver will most likely recommend good oral hygiene at each visit.   For cavities, gingivitis, or other oral health disease, your caregiver will perform a procedure to treat the problem. This is typically done at a separate appointment. Sometimes your caregiver will refer you to another dental specialist for specific tooth problems or for surgery.  SEEK IMMEDIATE DENTAL CARE IF:   You have pain, bleeding, or soreness in the gum, tooth, jaw, or mouth area.   A permanent tooth becomes loose or separated from the gum socket.   You experience a blow or injury to the mouth or jaw area.  Document Released: 03/07/2011 Document Revised: 06/14/2011 Document Reviewed: 03/07/2011   [employer] Patient Information 2012 [employer].
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
## 2011-10-28 ENCOUNTER — Emergency Department (HOSPITAL_BASED_OUTPATIENT_CLINIC_OR_DEPARTMENT_OTHER)
Admission: EM | Admit: 2011-10-28 | Discharge: 2011-10-28 | Disposition: A | Discharge status: Home / Self Care | Payer: Medicaid Other | Attending: Emergency Medicine | Admitting: Emergency Medicine

## 2011-10-28 ENCOUNTER — Encounter (HOSPITAL_BASED_OUTPATIENT_CLINIC_OR_DEPARTMENT_OTHER): Payer: Self-pay | Admitting: *Deleted

## 2011-10-28 DIAGNOSIS — A499 Bacterial infection, unspecified: Secondary | ICD-10-CM | POA: Insufficient documentation

## 2011-10-28 DIAGNOSIS — R109 Unspecified abdominal pain: Secondary | ICD-10-CM | POA: Insufficient documentation

## 2011-10-28 DIAGNOSIS — N760 Acute vaginitis: Secondary | ICD-10-CM | POA: Insufficient documentation

## 2011-10-28 DIAGNOSIS — G8929 Other chronic pain: Secondary | ICD-10-CM

## 2011-10-28 DIAGNOSIS — B9689 Other specified bacterial agents as the cause of diseases classified elsewhere: Secondary | ICD-10-CM | POA: Insufficient documentation

## 2011-10-28 LAB — URINALYSIS, ROUTINE W REFLEX MICROSCOPIC
Glucose, UA: NEGATIVE mg/dL
Hgb urine dipstick: NEGATIVE
Leukocytes, UA: NEGATIVE
Specific Gravity, Urine: 1.024 (ref 1.005–1.030)
pH: 7 (ref 5.0–8.0)

## 2011-10-28 LAB — WET PREP, GENITAL: Yeast Wet Prep HPF POC: NONE SEEN

## 2011-10-28 LAB — PREGNANCY, URINE: Preg Test, Ur: NEGATIVE

## 2011-10-28 MED ORDER — METRONIDAZOLE 500 MG PO TABS: 500.0000 mg | ORAL_TABLET | Freq: Two times a day (BID) | ORAL | Status: AC

## 2011-10-28 MED ORDER — ONDANSETRON HCL 4 MG/2ML IJ SOLN
4.0000 mg | Freq: Once | INTRAMUSCULAR | Status: AC
  Administered 2011-10-28: 4 mg via INTRAMUSCULAR
  Filled 2011-10-28: qty 2

## 2011-10-28 MED ORDER — OXYCODONE-ACETAMINOPHEN 5-325 MG PO TABS: 2.0000 | ORAL_TABLET | ORAL | Status: AC | PRN

## 2011-10-28 MED ORDER — HYDROMORPHONE HCL PF 2 MG/ML IJ SOLN
2.0000 mg | Freq: Once | INTRAMUSCULAR | Status: AC
  Administered 2011-10-28: 2 mg via INTRAMUSCULAR
  Filled 2011-10-28: qty 1

## 2011-10-28 NOTE — ED Provider Notes (Signed)
Medical screening examination/treatment/procedure(s) were performed by non-physician practitioner and as supervising physician I was immediately available for consultation/collaboration.  Ethelda Chick, MD 10/28/11 1900

## 2011-10-28 NOTE — Discharge Instructions (Signed)
Bacterial Vaginosis Bacterial vaginosis (BV) is a vaginal infection where the normal balance of bacteria in the vagina is disrupted. The normal balance is then replaced by an overgrowth of certain bacteria. There are several different kinds of bacteria that can cause BV. BV is the most common vaginal infection in women of childbearing age. CAUSES   The cause of BV is not fully understood. BV develops when there is an increase or imbalance of harmful bacteria.   Some activities or behaviors can upset the normal balance of bacteria in the vagina and put women at increased risk including:   Having a new sex partner or multiple sex partners.   Douching.   Using an intrauterine device (IUD) for contraception.   It is not clear what role sexual activity plays in the development of BV. However, women that have never had sexual intercourse are rarely infected with BV.  Women do not get BV from toilet seats, bedding, swimming pools or from touching objects around them.  SYMPTOMS   Grey vaginal discharge.   A fish-like odor with discharge, especially after sexual intercourse.   Itching or burning of the vagina and vulva.   Burning or pain with urination.   Some women have no signs or symptoms at all.  DIAGNOSIS  Your caregiver must examine the vagina for signs of BV. Your caregiver will perform lab tests and look at the sample of vaginal fluid through a microscope. They will look for bacteria and abnormal cells (clue cells), a pH test higher than 4.5, and a positive amine test all associated with BV.  RISKS AND COMPLICATIONS   Pelvic inflammatory disease (PID).   Infections following gynecology surgery.   Developing HIV.   Developing herpes virus.  TREATMENT  Sometimes BV will clear up without treatment. However, all women with symptoms of BV should be treated to avoid complications, especially if gynecology surgery is planned. Female partners generally do not need to be treated. However,  BV may spread between female sex partners so treatment is helpful in preventing a recurrence of BV.   BV may be treated with antibiotics. The antibiotics come in either pill or vaginal cream forms. Either can be used with nonpregnant or pregnant women, but the recommended dosages differ. These antibiotics are not harmful to the baby.   BV can recur after treatment. If this happens, a second round of antibiotics will often be prescribed.   Treatment is important for pregnant women. If not treated, BV can cause a premature delivery, especially for a pregnant woman who had a premature birth in the past. All pregnant women who have symptoms of BV should be checked and treated.   For chronic reoccurrence of BV, treatment with a type of prescribed gel vaginally twice a week is helpful.  HOME CARE INSTRUCTIONS   Finish all medication as directed by your caregiver.   Do not have sex until treatment is completed.   Tell your sexual partner that you have a vaginal infection. They should see their caregiver and be treated if they have problems, such as a mild rash or itching.   Practice safe sex. Use condoms. Only have 1 sex partner.  PREVENTION  Basic prevention steps can help reduce the risk of upsetting the natural balance of bacteria in the vagina and developing BV:  Do not have sexual intercourse (be abstinent).   Do not douche.   Use all of the medicine prescribed for treatment of BV, even if the signs and symptoms go away.     Tell your sex partner if you have BV. That way, they can be treated, if needed, to prevent reoccurrence.  SEEK MEDICAL CARE IF:   Your symptoms are not improving after 3 days of treatment.   You have increased discharge, pain, or fever.  MAKE SURE YOU:   Understand these instructions.   Will watch your condition.   Will get help right away if you are not doing well or get worse.  FOR MORE INFORMATION  Division of STD Prevention (DSTDP), Centers for Disease  Control and Prevention: www.cdc.gov/std American [employer] Health Association (ASHA): www.ashastd.org  Document Released: 06/25/2005 Document Revised: 06/14/2011 Document Reviewed: 12/16/2008 Exit[employer] Patient Information 2012 [employer]. 

## 2011-10-28 NOTE — ED Notes (Addendum)
Pt has hx of endometriosis and is supposed to be following up with a specialist. Has had LLQ pain and vomiting for 3 days. Decreased PO intake. Denies UTI s/s or vaginal d/c
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
## 2011-10-31 NOTE — ED Notes (Signed)
+  Chlamydia. Chart sent to EDP office for review. DHHS attached. 

## 2011-11-01 NOTE — ED Notes (Signed)
Chart returned from EDP office. Prescribed Azithromycin 1 gram PO once, 500 mg tabs (# 2 tablets) OR [depends on cost for patient] Doxycycline 100 mg PO BID x 7 days #14 tabs. Have patient follow-up with [facility].

## 2011-11-05 NOTE — ED Notes (Signed)
Attempt made to contact patient-Voice mail box full.

## 2011-11-08 ENCOUNTER — Emergency Department (INDEPENDENT_AMBULATORY_CARE_PROVIDER_SITE_OTHER)
Admission: EM | Admit: 2011-11-08 | Discharge: 2011-11-08 | Disposition: A | Discharge status: Home Health | Payer: Medicaid Other | Source: Home / Self Care | Attending: Emergency Medicine | Admitting: Emergency Medicine

## 2011-11-08 ENCOUNTER — Encounter (HOSPITAL_COMMUNITY): Payer: Self-pay

## 2011-11-08 DIAGNOSIS — J029 Acute pharyngitis, unspecified: Secondary | ICD-10-CM

## 2011-11-08 DIAGNOSIS — J392 Other diseases of pharynx: Secondary | ICD-10-CM

## 2011-11-08 HISTORY — DX: Acute vaginitis: B96.89

## 2011-11-08 HISTORY — DX: Acute vaginitis: N76.0

## 2011-11-08 HISTORY — DX: Chlamydial infection, unspecified: A74.9

## 2011-11-08 MED ORDER — LIDOCAINE HCL (PF) 1 % IJ SOLN
INTRAMUSCULAR | Status: AC
  Filled 2011-11-08: qty 5

## 2011-11-08 MED ORDER — GI COCKTAIL ~~LOC~~
30.0000 mL | Freq: Once | ORAL | Status: AC
  Administered 2011-11-08: 30 mL via ORAL

## 2011-11-08 MED ORDER — AZITHROMYCIN 250 MG PO TABS
ORAL_TABLET | ORAL | Status: AC
  Filled 2011-11-08: qty 4

## 2011-11-08 MED ORDER — GI COCKTAIL ~~LOC~~
ORAL | Status: AC
  Filled 2011-11-08: qty 30

## 2011-11-08 MED ORDER — CEFTRIAXONE SODIUM 250 MG IJ SOLR
INTRAMUSCULAR | Status: AC
  Filled 2011-11-08: qty 250

## 2011-11-08 MED ORDER — FIRST-DUKES MOUTHWASH MT SUSP: 10.0000 mL | Freq: Four times a day (QID) | OROMUCOSAL | Status: DC | PRN

## 2011-11-08 MED ORDER — CEFTRIAXONE SODIUM 250 MG IJ SOLR
250.0000 mg | Freq: Once | INTRAMUSCULAR | Status: AC
  Administered 2011-11-08: 250 mg via INTRAMUSCULAR

## 2011-11-08 MED ORDER — AZITHROMYCIN 250 MG PO TABS
1000.0000 mg | ORAL_TABLET | Freq: Once | ORAL | Status: AC
  Administered 2011-11-08: 1000 mg via ORAL

## 2011-11-08 MED ORDER — HYDROCODONE-ACETAMINOPHEN 5-325 MG PO TABS: 2.0000 | ORAL_TABLET | ORAL | Status: DC | PRN

## 2011-11-08 MED ORDER — IBUPROFEN 600 MG PO TABS: 600.0000 mg | ORAL_TABLET | Freq: Four times a day (QID) | ORAL | Status: DC | PRN
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
## 2011-11-08 NOTE — ED Notes (Signed)
Called patient and informed them of + result. Wants RX called to [facility] on [address].

## 2011-11-08 NOTE — ED Notes (Signed)
RX called in by Risa Grill PFM.

## 2011-11-08 NOTE — ED Notes (Signed)
Pt c/o sore throat for past 3 weeks.  Pt states she was seen at [facility] and given Rx for cough syrup.  Pt states she wakes in the middle of the night with panic attacks because she feels like she can't breathe.  Pt states she can't see PCP until 5/19.

## 2011-11-08 NOTE — Discharge Instructions (Signed)
You will need  to followup with an ear nose throat specialist if you do not get better after this treatment. Return if you have a persistent fever above 100.4, if you aren't able to swallow clips, if you're unable to open her mouth, or any other concerns.

## 2011-11-08 NOTE — ED Notes (Signed)
Pt had to be called back into dept.  Pt states she couldn't wait in the room any longer and preferred to wait in her car.  Her boyfriend sent out to retrieve her.

## 2011-11-15 ENCOUNTER — Telehealth (HOSPITAL_COMMUNITY): Payer: Self-pay | Admitting: *Deleted

## 2011-11-15 NOTE — ED Notes (Signed)
1548 Pt. called and requested a refill of the magic mouthwash.  States her throat is still irritated and something in it numbs the pain when she gargles with it.  Discussed with Langston Masker PA and she is concerned that pt. still has pain after 1 week.  She said to tell pt. to her to f/u with her MD or come back here in 3 days if not better. She approved refill but have pt. gargle and spit. I called pt. back and given instructions.  She called her PCP today but would not be able to be seen until 5/23. Pt. wants Rx. called to [facility] on [address] Rx. called to pharmacist. Vassie Moselle. 11/15/2011

## 2011-11-16 ENCOUNTER — Emergency Department (INDEPENDENT_AMBULATORY_CARE_PROVIDER_SITE_OTHER)
Admission: EM | Admit: 2011-11-16 | Discharge: 2011-11-16 | Disposition: A | Discharge status: Home / Self Care | Payer: Medicaid Other | Source: Home / Self Care | Attending: Emergency Medicine | Admitting: Emergency Medicine

## 2011-11-16 ENCOUNTER — Encounter (HOSPITAL_COMMUNITY): Payer: Self-pay | Admitting: Emergency Medicine

## 2011-11-16 DIAGNOSIS — N200 Calculus of kidney: Secondary | ICD-10-CM

## 2011-11-16 DIAGNOSIS — J029 Acute pharyngitis, unspecified: Secondary | ICD-10-CM

## 2011-11-16 LAB — POCT URINALYSIS DIP (DEVICE)
Ketones, ur: 15 mg/dL — AB
Protein, ur: >=300 mg/dL — AB
Specific Gravity, Urine: 1.025 (ref 1.005–1.030)
pH: 6 (ref 5.0–8.0)

## 2011-11-16 LAB — POCT PREGNANCY, URINE: Preg Test, Ur: NEGATIVE

## 2011-11-16 MED ORDER — ONDANSETRON HCL 4 MG PO TABS: 4.0000 mg | ORAL_TABLET | Freq: Four times a day (QID) | ORAL | Status: AC

## 2011-11-16 MED ORDER — KETOROLAC TROMETHAMINE 10 MG PO TABS: 10.0000 mg | ORAL_TABLET | Freq: Four times a day (QID) | ORAL | Status: AC | PRN

## 2011-11-16 MED ORDER — HYDROCODONE-ACETAMINOPHEN 5-325 MG PO TABS: 2.0000 | ORAL_TABLET | ORAL | Status: AC | PRN

## 2011-11-16 MED ORDER — PANTOPRAZOLE SODIUM 40 MG PO TBEC: 40.0000 mg | DELAYED_RELEASE_TABLET | Freq: Every day | ORAL | Status: DC

## 2011-11-16 MED ORDER — SUCRALFATE 1 GM/10ML PO SUSP: 1.0000 g | Freq: Four times a day (QID) | ORAL | Status: DC

## 2011-11-16 MED ORDER — FAMOTIDINE 20 MG PO TABS: 20.0000 mg | ORAL_TABLET | Freq: Two times a day (BID) | ORAL | Status: DC

## 2011-11-16 MED ORDER — GI COCKTAIL ~~LOC~~
30.0000 mL | Freq: Once | ORAL | Status: AC
  Administered 2011-11-16: 30 mL via ORAL

## 2011-11-16 MED ORDER — GI COCKTAIL ~~LOC~~
ORAL | Status: AC
  Filled 2011-11-16: qty 30

## 2011-11-16 MED ORDER — TAMSULOSIN HCL 0.4 MG PO CAPS: 0.4000 mg | ORAL_CAPSULE | Freq: Every day | ORAL | Status: DC

## 2011-11-16 NOTE — Discharge Instructions (Signed)
Take the medication as written. Go to the ED if it seems that your kidney stone pain is getting worse, if you start vomiting, or for the things we discussed. Your next step is to be evaluated by GI or ENT.

## 2011-11-16 NOTE — ED Notes (Signed)
Pt here with unresolved throat pain with swallowing since prescribed ATB,MAGIC MOUTHWASH 5/2.pt also called back for refill mouthwash due to increase irritation 5/9 and told to f/u with no relief.pt states it feels like throat swollen causing diff eating.pt also c/o poss kidney infection that started x 2dys ago with constant r flank pain and dark blood tinged urine.no fevers,n,v reported.pt is on menstrual now
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
## 2011-12-03 ENCOUNTER — Emergency Department (HOSPITAL_BASED_OUTPATIENT_CLINIC_OR_DEPARTMENT_OTHER)
Admission: EM | Admit: 2011-12-03 | Discharge: 2011-12-03 | Disposition: A | Discharge status: Home / Self Care | Payer: Medicaid Other | Attending: Emergency Medicine | Admitting: Emergency Medicine

## 2011-12-03 ENCOUNTER — Encounter (HOSPITAL_BASED_OUTPATIENT_CLINIC_OR_DEPARTMENT_OTHER): Payer: Self-pay | Admitting: *Deleted

## 2011-12-03 ENCOUNTER — Emergency Department (HOSPITAL_BASED_OUTPATIENT_CLINIC_OR_DEPARTMENT_OTHER): Payer: Medicaid Other

## 2011-12-03 DIAGNOSIS — R51 Headache: Secondary | ICD-10-CM | POA: Insufficient documentation

## 2011-12-03 DIAGNOSIS — M129 Arthropathy, unspecified: Secondary | ICD-10-CM | POA: Insufficient documentation

## 2011-12-03 DIAGNOSIS — R0602 Shortness of breath: Secondary | ICD-10-CM | POA: Insufficient documentation

## 2011-12-03 DIAGNOSIS — S161XXA Strain of muscle, fascia and tendon at neck level, initial encounter: Secondary | ICD-10-CM

## 2011-12-03 DIAGNOSIS — S139XXA Sprain of joints and ligaments of unspecified parts of neck, initial encounter: Secondary | ICD-10-CM | POA: Insufficient documentation

## 2011-12-03 DIAGNOSIS — F17200 Nicotine dependence, unspecified, uncomplicated: Secondary | ICD-10-CM | POA: Insufficient documentation

## 2011-12-03 DIAGNOSIS — M545 Low back pain, unspecified: Secondary | ICD-10-CM | POA: Insufficient documentation

## 2011-12-03 DIAGNOSIS — M62838 Other muscle spasm: Secondary | ICD-10-CM | POA: Insufficient documentation

## 2011-12-03 DIAGNOSIS — H538 Other visual disturbances: Secondary | ICD-10-CM | POA: Insufficient documentation

## 2011-12-03 DIAGNOSIS — F411 Generalized anxiety disorder: Secondary | ICD-10-CM | POA: Insufficient documentation

## 2011-12-03 DIAGNOSIS — X58XXXA Exposure to other specified factors, initial encounter: Secondary | ICD-10-CM | POA: Insufficient documentation

## 2011-12-03 DIAGNOSIS — M5137 Other intervertebral disc degeneration, lumbosacral region: Secondary | ICD-10-CM | POA: Insufficient documentation

## 2011-12-03 DIAGNOSIS — M51379 Other intervertebral disc degeneration, lumbosacral region without mention of lumbar back pain or lower extremity pain: Secondary | ICD-10-CM | POA: Insufficient documentation

## 2011-12-03 DIAGNOSIS — Z79899 Other long term (current) drug therapy: Secondary | ICD-10-CM | POA: Insufficient documentation

## 2011-12-03 DIAGNOSIS — N189 Chronic kidney disease, unspecified: Secondary | ICD-10-CM | POA: Insufficient documentation

## 2011-12-03 DIAGNOSIS — M542 Cervicalgia: Secondary | ICD-10-CM | POA: Insufficient documentation

## 2011-12-03 MED ORDER — CYCLOBENZAPRINE HCL 10 MG PO TABS
10.0000 mg | ORAL_TABLET | Freq: Once | ORAL | Status: AC
  Administered 2011-12-03: 10 mg via ORAL
  Filled 2011-12-03: qty 1

## 2011-12-03 MED ORDER — OXYCODONE-ACETAMINOPHEN 5-325 MG PO TABS: 1.0000 | ORAL_TABLET | Freq: Four times a day (QID) | ORAL | Status: AC | PRN

## 2011-12-03 MED ORDER — OXYCODONE-ACETAMINOPHEN 5-325 MG PO TABS
1.0000 | ORAL_TABLET | Freq: Once | ORAL | Status: AC
  Administered 2011-12-03: 1 via ORAL
  Filled 2011-12-03: qty 1

## 2011-12-03 MED ORDER — KETOROLAC TROMETHAMINE 30 MG/ML IJ SOLN
30.0000 mg | Freq: Once | INTRAMUSCULAR | Status: AC
  Administered 2011-12-03: 30 mg via INTRAVENOUS
  Filled 2011-12-03: qty 1

## 2011-12-03 MED ORDER — CYCLOBENZAPRINE HCL 10 MG PO TABS: 10.0000 mg | ORAL_TABLET | Freq: Three times a day (TID) | ORAL | Status: AC | PRN
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
## 2011-12-03 NOTE — Discharge Instructions (Signed)
Cervical Strain Care After A cervical strain is when the muscles and ligaments in your neck have been stretched. The bones are not broken. If you had any problems moving your arms or legs immediately after the injury, even if the problem has gone away, make sure to tell this to your caregiver.  HOME CARE INSTRUCTIONS   While awake, apply ice packs to the neck or areas of pain about every 1 to 2 hours, for 15 to 20 minutes at a time. Do this for 2 days. If you were given a cervical collar for support, ask your caregiver if you may remove it for bathing or applying ice.   If given a cervical collar, wear as instructed. Do not remove any collar unless instructed by a caregiver.   Only take over-the-counter or prescription medicines for pain, discomfort, or fever as directed by your caregiver.  Recheck with the hospital or clinic after a radiologist has read your X-rays. Recheck with the hospital or clinic to make sure the initial readings are correct. Do this also to determine if you need further studies. It is your responsibility to find out your X-ray results. X-rays are sometimes repeated in one week to ten days. These are often repeated to make sure that a hairline fracture was not overlooked. Ask your caregiver how you are to find out about your radiology (X-ray) results. SEEK IMMEDIATE MEDICAL CARE IF:   You have increasing pain in your neck.   You develop difficulties swallowing or breathing.   You have numbness, weakness, or movement problems in the arms or legs.   You have difficulty walking. You develop bowel or bladder retention or incontinence.    Narcotic Withdrawal If you take narcotic drugs for a long time, you may become dependent on them. Stopping these medicines suddenly can cause physical symptoms of withdrawal. Narcotics include opiate prescription pain medicines and heroin. Commonly prescribed narcotics include codeine, hydrocodone, oxycodone, methadone, and  morphine. SYMPTOMS  Narcotics tend to slow down body and mental function. When you quit taking narcotics, your body and mind are stimulated. Some withdrawal symptoms include: Irritability.  Anxiety.  Runny nose.  "Goose flesh."  Diarrhea.  Nausea.  Muscle spasms.  Sleeplessness.  Chills.  Sweats.  Drug cravings.  Confusion.  Withdrawal symptoms are troubling. The severity depends on: Your body's make up.  The amount of drugs you used.  The length of time you used them.  You may be at greater risk of having twitching and shaking (seizure) during the first several days of withdrawal from sedative drugs, including narcotics. However, opiate withdrawal rarely causes a seizure. Withdrawal is uncomfortable, but it is not life-threatening for adults unless there is a medical complication, such as heart disease. HOME CARE INSTRUCTIONS  Drink fluids, get plenty of rest, and take hot baths.  Medicines may be prescribed to help control withdrawal symptoms.  Over-the-counter medicines may be helpful to control diarrhea or an upset stomach.  If your problems resulted from taking prescription pain medicines, make sure you have a follow-up visit with your caregiver within the next few days. Be open about this problem.  If you are dependent or addicted to street drugs, contact a local drug and alcohol treatment center or Narcotics Anonymous.  Have someone with you to monitor your symptoms.  Engage in healthy activities with friends who do not use drugs.  Stay away from the drug scene.  SEEK IMMEDIATE MEDICAL CARE IF:  You have vomiting that cannot be controlled, especially if  you cannot keep liquids down.  You are seeing things or hearing voices that are not really there (hallucinating).  You have a seizure.  Document Released: 08/02/2004 Document Revised: 06/14/2011 Document Reviewed: 11/25/2009  [facility] Patient Information 2012 [place], [place].  You have problems with walking.  MAKE SURE  YOU:   Understand these instructions.   Will watch your condition.   Will get help right away if you are not doing well or get worse.  Document Released: 06/25/2005 Document Revised: 03/07/2011 Document Reviewed: 02/06/2008 [facility] Patient Information 2012 [place], [place].

## 2011-12-04 NOTE — ED Provider Notes (Signed)
I saw and evaluated the patient, reviewed the resident's note and I agree with the findings and plan.  Pt with neck pain, supple, no fever, no recent trauma, no focal neuro symptoms.  Pt with h/o chronic back pain, ran out of her percocet recently.  Pt with some symptoms concerning for withdrawal which likely has heightened pain response.  Not toxic.    Gavin Pound. Ondine Gemme, MD 12/04/11 1009

## 2011-12-15 ENCOUNTER — Encounter (HOSPITAL_BASED_OUTPATIENT_CLINIC_OR_DEPARTMENT_OTHER): Payer: Self-pay | Admitting: Emergency Medicine

## 2011-12-15 ENCOUNTER — Emergency Department (HOSPITAL_BASED_OUTPATIENT_CLINIC_OR_DEPARTMENT_OTHER)
Admission: EM | Admit: 2011-12-15 | Discharge: 2011-12-15 | Disposition: A | Discharge status: Home / Self Care | Payer: Medicaid Other | Attending: Emergency Medicine | Admitting: Emergency Medicine

## 2011-12-15 DIAGNOSIS — K089 Disorder of teeth and supporting structures, unspecified: Secondary | ICD-10-CM | POA: Insufficient documentation

## 2011-12-15 DIAGNOSIS — F17200 Nicotine dependence, unspecified, uncomplicated: Secondary | ICD-10-CM | POA: Insufficient documentation

## 2011-12-15 DIAGNOSIS — J3489 Other specified disorders of nose and nasal sinuses: Secondary | ICD-10-CM | POA: Insufficient documentation

## 2011-12-15 DIAGNOSIS — J029 Acute pharyngitis, unspecified: Secondary | ICD-10-CM | POA: Insufficient documentation

## 2011-12-15 MED ORDER — AMOXICILLIN-POT CLAVULANATE 875-125 MG PO TABS: 1.0000 | ORAL_TABLET | Freq: Two times a day (BID) | ORAL | Status: AC

## 2011-12-15 MED ORDER — HYDROCODONE-ACETAMINOPHEN 5-325 MG PO TABS
2.0000 | ORAL_TABLET | Freq: Once | ORAL | Status: AC
  Administered 2011-12-15: 2 via ORAL
  Filled 2011-12-15: qty 2

## 2011-12-15 MED ORDER — HYDROCODONE-ACETAMINOPHEN 5-325 MG PO TABS: 2.0000 | ORAL_TABLET | ORAL | Status: AC | PRN

## 2011-12-15 NOTE — Discharge Instructions (Signed)
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
## 2011-12-15 NOTE — ED Notes (Signed)
Pt c/o sore throat & "swelling" x 2 days- was seen recently & tx for same

## 2011-12-16 NOTE — ED Provider Notes (Signed)
Medical screening examination/treatment/procedure(s) were performed by non-physician practitioner and as supervising physician I was immediately available for consultation/collaboration.  Frank Pilger, MD 12/16/11 1540 

## 2012-01-01 ENCOUNTER — Emergency Department (HOSPITAL_BASED_OUTPATIENT_CLINIC_OR_DEPARTMENT_OTHER)
Admission: EM | Admit: 2012-01-01 | Discharge: 2012-01-01 | Disposition: A | Discharge status: Home / Self Care | Payer: Medicaid Other | Attending: Emergency Medicine | Admitting: Emergency Medicine

## 2012-01-01 ENCOUNTER — Encounter (HOSPITAL_BASED_OUTPATIENT_CLINIC_OR_DEPARTMENT_OTHER): Payer: Self-pay | Admitting: *Deleted

## 2012-01-01 DIAGNOSIS — K1370 Unspecified lesions of oral mucosa: Secondary | ICD-10-CM | POA: Insufficient documentation

## 2012-01-01 NOTE — ED Notes (Signed)
Witnessed pt leaving.  

## 2012-01-01 NOTE — ED Notes (Signed)
States she has had pain in her mouth for months. Was diagnoses with oral chlamydia and treated for it but she thinks she still has it. Friend states she has been crying for hours.

## 2012-01-01 NOTE — ED Notes (Signed)
No answer in waiting room when called to treatment room 

## 2012-02-19 ENCOUNTER — Emergency Department (HOSPITAL_COMMUNITY)
Admission: EM | Admit: 2012-02-19 | Discharge: 2012-02-19 | Disposition: A | Discharge status: Home / Self Care | Payer: Medicaid Other | Source: Home / Self Care | Attending: Emergency Medicine | Admitting: Emergency Medicine

## 2012-02-19 ENCOUNTER — Encounter (HOSPITAL_COMMUNITY): Payer: Self-pay | Admitting: Emergency Medicine

## 2012-02-19 DIAGNOSIS — G43909 Migraine, unspecified, not intractable, without status migrainosus: Secondary | ICD-10-CM

## 2012-02-19 DIAGNOSIS — G8929 Other chronic pain: Secondary | ICD-10-CM

## 2012-02-19 HISTORY — DX: Calculus of kidney: N20.0

## 2012-02-19 HISTORY — DX: Migraine, unspecified, not intractable, without status migrainosus: G43.909

## 2012-02-19 MED ORDER — HYDROCODONE-ACETAMINOPHEN 5-325 MG PO TABS: 2.0000 | ORAL_TABLET | Freq: Four times a day (QID) | ORAL | Status: AC | PRN

## 2012-02-19 MED ORDER — DIPHENHYDRAMINE HCL 25 MG PO CAPS
50.0000 mg | ORAL_CAPSULE | Freq: Once | ORAL | Status: AC
  Administered 2012-02-19: 50 mg via ORAL

## 2012-02-19 MED ORDER — ONDANSETRON 4 MG PO TBDP
4.0000 mg | ORAL_TABLET | Freq: Once | ORAL | Status: AC
  Administered 2012-02-19: 4 mg via ORAL

## 2012-02-19 MED ORDER — SUMATRIPTAN SUCCINATE 6 MG/0.5ML ~~LOC~~ SOLN
6.0000 mg | Freq: Once | SUBCUTANEOUS | Status: AC
  Administered 2012-02-19: 6 mg via SUBCUTANEOUS

## 2012-02-19 MED ORDER — SUMATRIPTAN SUCCINATE 100 MG PO TABS: 100.0000 mg | ORAL_TABLET | ORAL | Status: DC | PRN

## 2012-02-19 MED ORDER — BUTALBITAL-APAP-CAFFEINE 50-325-40 MG PO TABS: 1.0000 | ORAL_TABLET | Freq: Four times a day (QID) | ORAL | Status: AC | PRN

## 2012-02-19 MED ORDER — DIPHENHYDRAMINE HCL 25 MG PO CAPS
ORAL_CAPSULE | ORAL | Status: AC
  Filled 2012-02-19: qty 2

## 2012-02-19 MED ORDER — KETOROLAC TROMETHAMINE 30 MG/ML IJ SOLN
60.0000 mg | Freq: Once | INTRAMUSCULAR | Status: AC
  Administered 2012-02-19: 60 mg via INTRAMUSCULAR

## 2012-02-19 MED ORDER — TIZANIDINE HCL 4 MG PO TABS: 4.0000 mg | ORAL_TABLET | Freq: Two times a day (BID) | ORAL | Status: AC

## 2012-02-19 MED ORDER — PROCHLORPERAZINE MALEATE 10 MG PO TABS: 10.0000 mg | ORAL_TABLET | Freq: Three times a day (TID) | ORAL | Status: DC | PRN

## 2012-02-19 MED ORDER — ONDANSETRON 4 MG PO TBDP
ORAL_TABLET | ORAL | Status: AC
  Filled 2012-02-19: qty 1

## 2012-02-19 MED ORDER — DEXAMETHASONE SODIUM PHOSPHATE 10 MG/ML IJ SOLN
10.0000 mg | Freq: Once | INTRAMUSCULAR | Status: AC
  Administered 2012-02-19: 10 mg via INTRAMUSCULAR

## 2012-02-19 MED ORDER — IBUPROFEN 800 MG PO TABS: 800.0000 mg | ORAL_TABLET | Freq: Three times a day (TID) | ORAL | Status: AC

## 2012-02-19 MED ORDER — KETOROLAC TROMETHAMINE 60 MG/2ML IM SOLN
INTRAMUSCULAR | Status: AC
  Filled 2012-02-19: qty 2

## 2012-02-19 MED ORDER — SUMATRIPTAN SUCCINATE 6 MG/0.5ML ~~LOC~~ SOLN
SUBCUTANEOUS | Status: AC
  Filled 2012-02-19: qty 0.5

## 2012-02-19 MED ORDER — DEXAMETHASONE SODIUM PHOSPHATE 10 MG/ML IJ SOLN
INTRAMUSCULAR | Status: AC
  Filled 2012-02-19: qty 1

## 2012-02-19 NOTE — ED Notes (Signed)
Reports neck pain, worse than usual. Recently moved into a new home.  Moving furniture.  Thinks increased neck pain related to this.  Reports headaches for 2 weeks triggered by neck pain per patient .  Also noticed bumps on tongue 2-3 days ago, sensitive to touch

## 2012-02-19 NOTE — ED Notes (Signed)
Dr. Chaney Malling requested a referral form for [facility] and filled it a portion of it. I completed it and faxed it to them. Vassie Moselle 02/19/2012
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
## 2012-02-19 NOTE — ED Notes (Signed)
Reports vaginal discharge, whitish color, reports odor to discharge.  Reports right side and back pain, denies uti symptoms.   Patient reports [facility] no longer available to her

## 2012-03-31 ENCOUNTER — Encounter (HOSPITAL_COMMUNITY): Payer: Self-pay

## 2012-03-31 ENCOUNTER — Emergency Department (HOSPITAL_COMMUNITY)
Admission: EM | Admit: 2012-03-31 | Discharge: 2012-03-31 | Disposition: A | Discharge status: Home / Self Care | Payer: Medicaid Other | Source: Home / Self Care | Attending: Family Medicine | Admitting: Family Medicine

## 2012-03-31 DIAGNOSIS — N390 Urinary tract infection, site not specified: Secondary | ICD-10-CM

## 2012-03-31 DIAGNOSIS — N898 Other specified noninflammatory disorders of vagina: Secondary | ICD-10-CM

## 2012-03-31 DIAGNOSIS — K047 Periapical abscess without sinus: Secondary | ICD-10-CM

## 2012-03-31 LAB — WET PREP, GENITAL
Trich, Wet Prep: NONE SEEN
Yeast Wet Prep HPF POC: NONE SEEN

## 2012-03-31 LAB — POCT URINALYSIS DIP (DEVICE)
Nitrite: POSITIVE — AB
Specific Gravity, Urine: 1.025 (ref 1.005–1.030)
Urobilinogen, UA: 1 mg/dL (ref 0.0–1.0)
pH: 6 (ref 5.0–8.0)

## 2012-03-31 MED ORDER — AMOXICILLIN 500 MG PO CAPS: 500.0000 mg | ORAL_CAPSULE | Freq: Three times a day (TID) | ORAL | Status: DC

## 2012-03-31 MED ORDER — TRAMADOL HCL 50 MG PO TABS: 50.0000 mg | ORAL_TABLET | Freq: Four times a day (QID) | ORAL | Status: DC | PRN

## 2012-03-31 MED ORDER — FLUCONAZOLE 150 MG PO TABS: 150.0000 mg | ORAL_TABLET | Freq: Every day | ORAL | Status: AC

## 2012-03-31 NOTE — ED Notes (Signed)
Patient states her partner has informed her of chlamydia infection; tearful; also c/o severe mouth pain, and reportedly has been to her dentist x 2 and has been told she does not have a dental problem Medical laboratory scientific officer)
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
## 2012-04-01 LAB — GC/CHLAMYDIA PROBE AMP, GENITAL
Chlamydia, DNA Probe: NEGATIVE
GC Probe Amp, Genital: NEGATIVE

## 2012-04-02 LAB — URINE CULTURE

## 2012-04-03 NOTE — ED Notes (Signed)
Urine culture shows escherichia coli.  Pt adequately treated at visit with Rx for amoxicillin.

## 2012-04-04 NOTE — ED Provider Notes (Signed)
Medical screening examination/treatment/procedure(s) were performed by resident physician or non-physician practitioner and as supervising physician I was immediately available for consultation/collaboration.   Barkley Bruns MD.    Linna Hoff, MD 04/04/12 [phone]

## 2012-04-14 ENCOUNTER — Ambulatory Visit: Payer: Medicaid Other | Admitting: Physical Therapy

## 2012-04-16 ENCOUNTER — Ambulatory Visit: Payer: Medicaid Other | Attending: Internal Medicine | Admitting: Physical Therapy

## 2012-07-25 ENCOUNTER — Emergency Department (HOSPITAL_BASED_OUTPATIENT_CLINIC_OR_DEPARTMENT_OTHER)
Admission: EM | Admit: 2012-07-25 | Discharge: 2012-07-25 | Disposition: A | Discharge status: Home / Self Care | Payer: Medicaid Other | Attending: Emergency Medicine | Admitting: Emergency Medicine

## 2012-07-25 ENCOUNTER — Encounter (HOSPITAL_BASED_OUTPATIENT_CLINIC_OR_DEPARTMENT_OTHER): Payer: Self-pay | Admitting: Emergency Medicine

## 2012-07-25 DIAGNOSIS — N189 Chronic kidney disease, unspecified: Secondary | ICD-10-CM | POA: Insufficient documentation

## 2012-07-25 DIAGNOSIS — Z8619 Personal history of other infectious and parasitic diseases: Secondary | ICD-10-CM | POA: Insufficient documentation

## 2012-07-25 DIAGNOSIS — Z79899 Other long term (current) drug therapy: Secondary | ICD-10-CM | POA: Insufficient documentation

## 2012-07-25 DIAGNOSIS — Z862 Personal history of diseases of the blood and blood-forming organs and certain disorders involving the immune mechanism: Secondary | ICD-10-CM | POA: Insufficient documentation

## 2012-07-25 DIAGNOSIS — H9203 Otalgia, bilateral: Secondary | ICD-10-CM

## 2012-07-25 DIAGNOSIS — Z87440 Personal history of urinary (tract) infections: Secondary | ICD-10-CM | POA: Insufficient documentation

## 2012-07-25 DIAGNOSIS — J029 Acute pharyngitis, unspecified: Secondary | ICD-10-CM | POA: Insufficient documentation

## 2012-07-25 DIAGNOSIS — Z8739 Personal history of other diseases of the musculoskeletal system and connective tissue: Secondary | ICD-10-CM | POA: Insufficient documentation

## 2012-07-25 DIAGNOSIS — F17200 Nicotine dependence, unspecified, uncomplicated: Secondary | ICD-10-CM | POA: Insufficient documentation

## 2012-07-25 DIAGNOSIS — F411 Generalized anxiety disorder: Secondary | ICD-10-CM | POA: Insufficient documentation

## 2012-07-25 DIAGNOSIS — Z8679 Personal history of other diseases of the circulatory system: Secondary | ICD-10-CM | POA: Insufficient documentation

## 2012-07-25 DIAGNOSIS — Z87442 Personal history of urinary calculi: Secondary | ICD-10-CM | POA: Insufficient documentation

## 2012-07-25 DIAGNOSIS — Z8742 Personal history of other diseases of the female genital tract: Secondary | ICD-10-CM | POA: Insufficient documentation

## 2012-07-25 HISTORY — DX: Unspecified convulsions: R56.9

## 2012-07-25 MED ORDER — TRAMADOL HCL 50 MG PO TABS: 50.0000 mg | ORAL_TABLET | Freq: Four times a day (QID) | ORAL | Status: DC | PRN

## 2012-07-25 MED ORDER — HYDROCODONE-ACETAMINOPHEN 5-325 MG PO TABS
1.0000 | ORAL_TABLET | Freq: Once | ORAL | Status: AC
  Administered 2012-07-25: 1 via ORAL
  Filled 2012-07-25: qty 1

## 2012-07-25 MED ORDER — DEXAMETHASONE SODIUM PHOSPHATE 10 MG/ML IJ SOLN
10.0000 mg | Freq: Once | INTRAMUSCULAR | Status: AC
  Administered 2012-07-25: 10 mg via INTRAMUSCULAR
  Filled 2012-07-25: qty 1

## 2012-07-25 MED ORDER — PENICILLIN G BENZATHINE 1200000 UNIT/2ML IM SUSP
1.2000 10*6.[IU] | Freq: Once | INTRAMUSCULAR | Status: AC
  Administered 2012-07-25: 1.2 10*6.[IU] via INTRAMUSCULAR
  Filled 2012-07-25: qty 2
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
## 2012-07-25 NOTE — ED Notes (Signed)
Pt. States she normally takes Antipyrine/Benzocaine otic solution which helps with symptoms.

## 2012-07-25 NOTE — ED Notes (Signed)
MD at bedside. 

## 2012-07-25 NOTE — ED Notes (Signed)
Bilateral earache x one month. Also c/o intermittent sore throat and fever.  Denies drainage.

## 2013-12-21 ENCOUNTER — Emergency Department (HOSPITAL_COMMUNITY): Payer: Medicaid Other

## 2013-12-21 ENCOUNTER — Encounter (HOSPITAL_COMMUNITY): Payer: Self-pay | Admitting: Emergency Medicine

## 2013-12-21 ENCOUNTER — Emergency Department (HOSPITAL_COMMUNITY)
Admission: EM | Admit: 2013-12-21 | Discharge: 2013-12-21 | Disposition: A | Discharge status: Home / Self Care | Payer: Medicaid Other | Attending: Emergency Medicine | Admitting: Emergency Medicine

## 2013-12-21 DIAGNOSIS — N189 Chronic kidney disease, unspecified: Secondary | ICD-10-CM | POA: Insufficient documentation

## 2013-12-21 DIAGNOSIS — Z862 Personal history of diseases of the blood and blood-forming organs and certain disorders involving the immune mechanism: Secondary | ICD-10-CM | POA: Insufficient documentation

## 2013-12-21 DIAGNOSIS — G43909 Migraine, unspecified, not intractable, without status migrainosus: Secondary | ICD-10-CM | POA: Insufficient documentation

## 2013-12-21 DIAGNOSIS — R1032 Left lower quadrant pain: Secondary | ICD-10-CM | POA: Insufficient documentation

## 2013-12-21 DIAGNOSIS — Z8659 Personal history of other mental and behavioral disorders: Secondary | ICD-10-CM | POA: Insufficient documentation

## 2013-12-21 DIAGNOSIS — R109 Unspecified abdominal pain: Secondary | ICD-10-CM | POA: Insufficient documentation

## 2013-12-21 DIAGNOSIS — M129 Arthropathy, unspecified: Secondary | ICD-10-CM | POA: Insufficient documentation

## 2013-12-21 DIAGNOSIS — Z3202 Encounter for pregnancy test, result negative: Secondary | ICD-10-CM | POA: Insufficient documentation

## 2013-12-21 DIAGNOSIS — Z87442 Personal history of urinary calculi: Secondary | ICD-10-CM | POA: Insufficient documentation

## 2013-12-21 DIAGNOSIS — Z9089 Acquired absence of other organs: Secondary | ICD-10-CM | POA: Insufficient documentation

## 2013-12-21 DIAGNOSIS — F17200 Nicotine dependence, unspecified, uncomplicated: Secondary | ICD-10-CM | POA: Insufficient documentation

## 2013-12-21 DIAGNOSIS — Z87440 Personal history of urinary (tract) infections: Secondary | ICD-10-CM | POA: Insufficient documentation

## 2013-12-21 DIAGNOSIS — G40909 Epilepsy, unspecified, not intractable, without status epilepticus: Secondary | ICD-10-CM | POA: Insufficient documentation

## 2013-12-21 DIAGNOSIS — Z91040 Latex allergy status: Secondary | ICD-10-CM | POA: Insufficient documentation

## 2013-12-21 DIAGNOSIS — R1030 Lower abdominal pain, unspecified: Secondary | ICD-10-CM

## 2013-12-21 DIAGNOSIS — Z8619 Personal history of other infectious and parasitic diseases: Secondary | ICD-10-CM | POA: Insufficient documentation

## 2013-12-21 HISTORY — DX: Personal history of other complications of pregnancy, childbirth and the puerperium: Z87.59

## 2013-12-21 LAB — CBC WITH DIFFERENTIAL/PLATELET
Basophils Absolute: 0 10*3/uL (ref 0.0–0.1)
Basophils Relative: 0 % (ref 0–1)
Eosinophils Absolute: 0.1 10*3/uL (ref 0.0–0.7)
Eosinophils Relative: 1 % (ref 0–5)
HCT: 40.4 % (ref 36.0–46.0)
HEMOGLOBIN: 13 g/dL (ref 12.0–15.0)
Lymphocytes Relative: 32 % (ref 12–46)
Lymphs Abs: 2.4 10*3/uL (ref 0.7–4.0)
MCH: 30.9 pg (ref 26.0–34.0)
MCHC: 32.2 g/dL (ref 30.0–36.0)
MCV: 96 fL (ref 78.0–100.0)
Monocytes Absolute: 0.5 10*3/uL (ref 0.1–1.0)
Monocytes Relative: 7 % (ref 3–12)
NEUTROS ABS: 4.5 10*3/uL (ref 1.7–7.7)
NEUTROS PCT: 60 % (ref 43–77)
PLATELETS: 155 10*3/uL (ref 150–400)
RBC: 4.21 MIL/uL (ref 3.87–5.11)
RDW: 13.8 % (ref 11.5–15.5)
WBC: 7.5 10*3/uL (ref 4.0–10.5)

## 2013-12-21 LAB — COMPREHENSIVE METABOLIC PANEL
ALT: 6 U/L (ref 0–35)
AST: 13 U/L (ref 0–37)
Albumin: 4 g/dL (ref 3.5–5.2)
Alkaline Phosphatase: 61 U/L (ref 39–117)
BUN: 11 mg/dL (ref 6–23)
CHLORIDE: 105 meq/L (ref 96–112)
CO2: 24 mEq/L (ref 19–32)
Calcium: 9 mg/dL (ref 8.4–10.5)
Creatinine, Ser: 0.64 mg/dL (ref 0.50–1.10)
GFR calc Af Amer: >90 mL/min (ref >90)
GFR calc non Af Amer: >90 mL/min (ref >90)
Glucose, Bld: 100 mg/dL — ABNORMAL HIGH (ref 70–99)
Potassium: 4 mEq/L (ref 3.7–5.3)
SODIUM: 141 meq/L (ref 137–147)
Total Protein: 6.8 g/dL (ref 6.0–8.3)

## 2013-12-21 LAB — LIPASE, BLOOD: Lipase: 19 U/L (ref 11–59)

## 2013-12-21 LAB — WET PREP, GENITAL
CLUE CELLS WET PREP: NONE SEEN
Trich, Wet Prep: NONE SEEN
Yeast Wet Prep HPF POC: NONE SEEN

## 2013-12-21 LAB — URINALYSIS, ROUTINE W REFLEX MICROSCOPIC
Bilirubin Urine: NEGATIVE
Glucose, UA: NEGATIVE mg/dL
Hgb urine dipstick: NEGATIVE
KETONES UR: NEGATIVE mg/dL
LEUKOCYTES UA: NEGATIVE
NITRITE: NEGATIVE
Protein, ur: NEGATIVE mg/dL
Specific Gravity, Urine: 1.023 (ref 1.005–1.030)
UROBILINOGEN UA: 0.2 mg/dL (ref 0.0–1.0)
pH: 8 (ref 5.0–8.0)

## 2013-12-21 LAB — POC URINE PREG, ED: Preg Test, Ur: NEGATIVE

## 2013-12-21 MED ORDER — FENTANYL CITRATE 0.05 MG/ML IJ SOLN
50.0000 ug | Freq: Once | INTRAMUSCULAR | Status: AC
  Administered 2013-12-21: 50 ug via INTRAVENOUS
  Filled 2013-12-21: qty 2

## 2013-12-21 MED ORDER — HYDROCODONE-ACETAMINOPHEN 5-325 MG PO TABS
2.0000 | ORAL_TABLET | Freq: Once | ORAL | Status: AC
  Administered 2013-12-21: 2 via ORAL
  Filled 2013-12-21: qty 2

## 2013-12-21 MED ORDER — ONDANSETRON HCL 4 MG/2ML IJ SOLN
4.0000 mg | Freq: Once | INTRAMUSCULAR | Status: AC
  Administered 2013-12-21: 4 mg via INTRAVENOUS
  Filled 2013-12-21: qty 2

## 2013-12-21 MED ORDER — OXYCODONE-ACETAMINOPHEN 5-325 MG PO TABS: ORAL_TABLET | ORAL | Status: DC
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
## 2013-12-21 NOTE — Discharge Instructions (Signed)
Take percocet for breakthrough pain, do not drink alcohol, drive, care for children or do other critical tasks while taking percocet. ° °Please follow with your primary care doctor in the next 2 days for a check-up. They must obtain records for further management.  ° °Do not hesitate to return to the Emergency Department for any new, worsening or concerning symptoms.  ° °Abdominal Pain, Women °Abdominal (stomach, pelvic, or belly) pain can be caused by many things. It is important to tell your doctor: °· The location of the pain. °· Does it come and go or is it present all the time? °· Are there things that start the pain (eating certain foods, exercise)? °· Are there other symptoms associated with the pain (fever, nausea, vomiting, diarrhea)? °All of this is helpful to know when trying to find the cause of the pain. °CAUSES  °· Stomach: virus or bacteria infection, or ulcer. °· Intestine: appendicitis (inflamed appendix), regional ileitis (Crohn's disease), ulcerative colitis (inflamed colon), irritable bowel syndrome, diverticulitis (inflamed diverticulum of the colon), or cancer of the stomach or intestine. °· Gallbladder disease or stones in the gallbladder. °· Kidney disease, kidney stones, or infection. °· Pancreas infection or cancer. °· Fibromyalgia (pain disorder). °· Diseases of the female organs: °· Uterus: fibroid (non-cancerous) tumors or infection. °· Fallopian tubes: infection or tubal pregnancy. °· Ovary: cysts or tumors. °· Pelvic adhesions (scar tissue). °· Endometriosis (uterus lining tissue growing in the pelvis and on the pelvic organs). °· Pelvic congestion syndrome (female organs filling up with blood just before the menstrual period). °· Pain with the menstrual period. °· Pain with ovulation (producing an egg). °· Pain with an IUD (intrauterine device, birth control) in the uterus. °· Cancer of the female organs. °· Functional pain (pain not caused by a disease, may improve without  treatment). °· Psychological pain. °· Depression. °DIAGNOSIS  °Your doctor will decide the seriousness of your pain by doing an examination. °· Blood tests. °· X-rays. °· Ultrasound. °· CT scan (computed tomography, special type of X-ray). °· MRI (magnetic resonance imaging). °· Cultures, for infection. °· Barium enema (dye inserted in the large intestine, to better view it with X-rays). °· Colonoscopy (looking in intestine with a lighted tube). °· Laparoscopy (minor surgery, looking in abdomen with a lighted tube). °· Major abdominal exploratory surgery (looking in abdomen with a large incision). °TREATMENT  °The treatment will depend on the cause of the pain.  °· Many cases can be observed and treated at home. °· Over-the-counter medicines recommended by your caregiver. °· Prescription medicine. °· Antibiotics, for infection. °· Birth control pills, for painful periods or for ovulation pain. °· Hormone treatment, for endometriosis. °· Nerve blocking injections. °· Physical therapy. °· Antidepressants. °· Counseling with a psychologist or psychiatrist. °· Minor or major surgery. °HOME CARE INSTRUCTIONS  °· Do not take laxatives, unless directed by your caregiver. °· Take over-the-counter pain medicine only if ordered by your caregiver. Do not take aspirin because it can cause an upset stomach or bleeding. °· Try a clear liquid diet (broth or water) as ordered by your caregiver. Slowly move to a bland diet, as tolerated, if the pain is related to the stomach or intestine. °· Have a thermometer and take your temperature several times a day, and record it. °· Bed rest and sleep, if it helps the pain. °· Avoid sexual intercourse, if it causes pain. °· Avoid stressful situations. °· Keep your follow-up appointments and tests, as your caregiver orders. °· If the   pain does not go away with medicine or surgery, you may try: °· Acupuncture. °· Relaxation exercises (yoga, meditation). °· Group therapy. °· Counseling. °SEEK  MEDICAL CARE IF:  °· You notice certain foods cause stomach pain. °· Your home care treatment is not helping your pain. °· You need stronger pain medicine. °· You want your IUD removed. °· You feel faint or lightheaded. °· You develop nausea and vomiting. °· You develop a rash. °· You are having side effects or an allergy to your medicine. °SEEK IMMEDIATE MEDICAL CARE IF:  °· Your pain does not go away or gets worse. °· You have a fever. °· Your pain is felt only in portions of the abdomen. The right side could possibly be appendicitis. The left lower portion of the abdomen could be colitis or diverticulitis. °· You are passing blood in your stools (bright red or black tarry stools, with or without vomiting). °· You have blood in your urine. °· You develop chills, with or without a fever. °· You pass out. °MAKE SURE YOU:  °· Understand these instructions. °· Will watch your condition. °· Will get help right away if you are not doing well or get worse. °Document Released: 04/22/2007 Document Revised: 09/17/2011 Document Reviewed: 05/12/2009 °[employer]® Patient Information ©2014 [employer]. ° °

## 2013-12-21 NOTE — ED Notes (Signed)
Pt with abdominal pain x 1 week.  Radiates to right back.  Pt states urine is cloudy and nausea worse when urinating.

## 2013-12-21 NOTE — ED Notes (Signed)
US at bedside

## 2013-12-22 LAB — GC/CHLAMYDIA PROBE AMP
CT PROBE, AMP APTIMA: NEGATIVE
GC Probe RNA: NEGATIVE

## 2013-12-23 LAB — URINE CULTURE

## 2013-12-24 NOTE — ED Provider Notes (Signed)
Medical screening examination/treatment/procedure(s) were performed by non-physician practitioner and as supervising physician I was immediately available for consultation/collaboration.   EKG Interpretation None        Kristen N Ward, DO 12/24/13 0703 

## 2014-01-23 ENCOUNTER — Encounter (HOSPITAL_COMMUNITY): Payer: Self-pay | Admitting: Emergency Medicine

## 2014-01-23 ENCOUNTER — Emergency Department (HOSPITAL_COMMUNITY)
Admission: EM | Admit: 2014-01-23 | Discharge: 2014-01-23 | Disposition: A | Discharge status: Home / Self Care | Payer: Medicaid Other | Attending: Emergency Medicine | Admitting: Emergency Medicine

## 2014-01-23 DIAGNOSIS — Z8739 Personal history of other diseases of the musculoskeletal system and connective tissue: Secondary | ICD-10-CM | POA: Diagnosis not present

## 2014-01-23 DIAGNOSIS — R112 Nausea with vomiting, unspecified: Secondary | ICD-10-CM | POA: Diagnosis not present

## 2014-01-23 DIAGNOSIS — Z9089 Acquired absence of other organs: Secondary | ICD-10-CM | POA: Insufficient documentation

## 2014-01-23 DIAGNOSIS — Z8619 Personal history of other infectious and parasitic diseases: Secondary | ICD-10-CM | POA: Diagnosis not present

## 2014-01-23 DIAGNOSIS — F17200 Nicotine dependence, unspecified, uncomplicated: Secondary | ICD-10-CM | POA: Diagnosis not present

## 2014-01-23 DIAGNOSIS — Z862 Personal history of diseases of the blood and blood-forming organs and certain disorders involving the immune mechanism: Secondary | ICD-10-CM | POA: Insufficient documentation

## 2014-01-23 DIAGNOSIS — Z87442 Personal history of urinary calculi: Secondary | ICD-10-CM | POA: Diagnosis not present

## 2014-01-23 DIAGNOSIS — R000 Tachycardia, unspecified: Secondary | ICD-10-CM | POA: Insufficient documentation

## 2014-01-23 DIAGNOSIS — Z3202 Encounter for pregnancy test, result negative: Secondary | ICD-10-CM | POA: Diagnosis not present

## 2014-01-23 DIAGNOSIS — N189 Chronic kidney disease, unspecified: Secondary | ICD-10-CM | POA: Diagnosis not present

## 2014-01-23 DIAGNOSIS — N949 Unspecified condition associated with female genital organs and menstrual cycle: Secondary | ICD-10-CM | POA: Diagnosis present

## 2014-01-23 DIAGNOSIS — F411 Generalized anxiety disorder: Secondary | ICD-10-CM | POA: Diagnosis not present

## 2014-01-23 DIAGNOSIS — N898 Other specified noninflammatory disorders of vagina: Secondary | ICD-10-CM | POA: Diagnosis not present

## 2014-01-23 DIAGNOSIS — R1032 Left lower quadrant pain: Secondary | ICD-10-CM | POA: Insufficient documentation

## 2014-01-23 DIAGNOSIS — Z91040 Latex allergy status: Secondary | ICD-10-CM | POA: Insufficient documentation

## 2014-01-23 DIAGNOSIS — R300 Dysuria: Secondary | ICD-10-CM | POA: Insufficient documentation

## 2014-01-23 DIAGNOSIS — Z8669 Personal history of other diseases of the nervous system and sense organs: Secondary | ICD-10-CM | POA: Diagnosis not present

## 2014-01-23 DIAGNOSIS — Z87440 Personal history of urinary (tract) infections: Secondary | ICD-10-CM | POA: Insufficient documentation

## 2014-01-23 LAB — CBC WITH DIFFERENTIAL/PLATELET
Basophils Absolute: 0 10*3/uL (ref 0.0–0.1)
Basophils Relative: 0 % (ref 0–1)
EOS ABS: 0.1 10*3/uL (ref 0.0–0.7)
Eosinophils Relative: 1 % (ref 0–5)
HCT: 42.1 % (ref 36.0–46.0)
HEMOGLOBIN: 13.5 g/dL (ref 12.0–15.0)
LYMPHS ABS: 2.1 10*3/uL (ref 0.7–4.0)
Lymphocytes Relative: 28 % (ref 12–46)
MCH: 30.3 pg (ref 26.0–34.0)
MCHC: 32.1 g/dL (ref 30.0–36.0)
MCV: 94.4 fL (ref 78.0–100.0)
Monocytes Absolute: 0.4 10*3/uL (ref 0.1–1.0)
Monocytes Relative: 5 % (ref 3–12)
NEUTROS PCT: 66 % (ref 43–77)
Neutro Abs: 4.9 10*3/uL (ref 1.7–7.7)
Platelets: 214 10*3/uL (ref 150–400)
RBC: 4.46 MIL/uL (ref 3.87–5.11)
RDW: 13 % (ref 11.5–15.5)
WBC: 7.5 10*3/uL (ref 4.0–10.5)

## 2014-01-23 LAB — COMPREHENSIVE METABOLIC PANEL
ALK PHOS: 75 U/L (ref 39–117)
ALT: 5 U/L (ref 0–35)
ANION GAP: 12 (ref 5–15)
AST: 15 U/L (ref 0–37)
Albumin: 4.4 g/dL (ref 3.5–5.2)
BILIRUBIN TOTAL: 0.4 mg/dL (ref 0.3–1.2)
BUN: 8 mg/dL (ref 6–23)
CO2: 24 meq/L (ref 19–32)
Calcium: 9.6 mg/dL (ref 8.4–10.5)
Chloride: 101 mEq/L (ref 96–112)
Creatinine, Ser: 0.63 mg/dL (ref 0.50–1.10)
GLUCOSE: 88 mg/dL (ref 70–99)
POTASSIUM: 3.6 meq/L — AB (ref 3.7–5.3)
SODIUM: 137 meq/L (ref 137–147)
Total Protein: 7.6 g/dL (ref 6.0–8.3)

## 2014-01-23 LAB — WET PREP, GENITAL
Clue Cells Wet Prep HPF POC: NONE SEEN
Trich, Wet Prep: NONE SEEN
YEAST WET PREP: NONE SEEN

## 2014-01-23 LAB — URINALYSIS, ROUTINE W REFLEX MICROSCOPIC
BILIRUBIN URINE: NEGATIVE
GLUCOSE, UA: NEGATIVE mg/dL
Hgb urine dipstick: NEGATIVE
KETONES UR: NEGATIVE mg/dL
Leukocytes, UA: NEGATIVE
Nitrite: NEGATIVE
PROTEIN: NEGATIVE mg/dL
Specific Gravity, Urine: 1.01 (ref 1.005–1.030)
UROBILINOGEN UA: 0.2 mg/dL (ref 0.0–1.0)
pH: 6.5 (ref 5.0–8.0)

## 2014-01-23 LAB — POC URINE PREG, ED: Preg Test, Ur: NEGATIVE

## 2014-01-23 MED ORDER — KETOROLAC TROMETHAMINE 30 MG/ML IJ SOLN
30.0000 mg | Freq: Once | INTRAMUSCULAR | Status: AC
  Administered 2014-01-23: 30 mg via INTRAVENOUS
  Filled 2014-01-23: qty 1

## 2014-01-23 MED ORDER — LORAZEPAM 2 MG/ML IJ SOLN
1.0000 mg | Freq: Once | INTRAMUSCULAR | Status: AC
  Administered 2014-01-23: 1 mg via INTRAVENOUS
  Filled 2014-01-23: qty 1

## 2014-01-23 MED ORDER — MORPHINE SULFATE 4 MG/ML IJ SOLN
4.0000 mg | Freq: Once | INTRAMUSCULAR | Status: AC
  Administered 2014-01-23: 4 mg via INTRAVENOUS
  Filled 2014-01-23: qty 1

## 2014-01-23 MED ORDER — HYDROCODONE-ACETAMINOPHEN 5-325 MG PO TABS: 1.0000 | ORAL_TABLET | ORAL | Status: DC | PRN

## 2014-01-23 MED ORDER — SODIUM CHLORIDE 0.9 % IV BOLUS (SEPSIS)
500.0000 mL | Freq: Once | INTRAVENOUS | Status: AC
  Administered 2014-01-23: 500 mL via INTRAVENOUS

## 2014-01-23 NOTE — Discharge Instructions (Signed)
Take Vicodin for severe pain only. No driving or operating heavy machinery while taking vicodin. This medication may cause drowsiness.  Abdominal Pain, Women Abdominal (stomach, pelvic, or belly) pain can be caused by many things. It is important to tell your doctor:  The location of the pain.  Does it come and go or is it present all the time?  Are there things that start the pain (eating certain foods, exercise)?  Are there other symptoms associated with the pain (fever, nausea, vomiting, diarrhea)? All of this is helpful to know when trying to find the cause of the pain. CAUSES   Stomach: virus or bacteria infection, or ulcer.  Intestine: appendicitis (inflamed appendix), regional ileitis (Crohn's disease), ulcerative colitis (inflamed colon), irritable bowel syndrome, diverticulitis (inflamed diverticulum of the colon), or cancer of the stomach or intestine.  Gallbladder disease or stones in the gallbladder.  Kidney disease, kidney stones, or infection.  Pancreas infection or cancer.  Fibromyalgia (pain disorder).  Diseases of the female organs:  Uterus: fibroid (non-cancerous) tumors or infection.  Fallopian tubes: infection or tubal pregnancy.  Ovary: cysts or tumors.  Pelvic adhesions (scar tissue).  Endometriosis (uterus lining tissue growing in the pelvis and on the pelvic organs).  Pelvic congestion syndrome (female organs filling up with blood just before the menstrual period).  Pain with the menstrual period.  Pain with ovulation (producing an egg).  Pain with an IUD (intrauterine device, birth control) in the uterus.  Cancer of the female organs.  Functional pain (pain not caused by a disease, may improve without treatment).  Psychological pain.  Depression. DIAGNOSIS  Your doctor will decide the seriousness of your pain by doing an examination.  Blood tests.  X-rays.  Ultrasound.  CT scan (computed tomography, special type of X-ray).  MRI  (magnetic resonance imaging).  Cultures, for infection.  Barium enema (dye inserted in the large intestine, to better view it with X-rays).  Colonoscopy (looking in intestine with a lighted tube).  Laparoscopy (minor surgery, looking in abdomen with a lighted tube).  Major abdominal exploratory surgery (looking in abdomen with a large incision). TREATMENT  The treatment will depend on the cause of the pain.   Many cases can be observed and treated at home.  Over-the-counter medicines recommended by your caregiver.  Prescription medicine.  Antibiotics, for infection.  Birth control pills, for painful periods or for ovulation pain.  Hormone treatment, for endometriosis.  Nerve blocking injections.  Physical therapy.  Antidepressants.  Counseling with a psychologist or psychiatrist.  Minor or major surgery. HOME CARE INSTRUCTIONS   Do not take laxatives, unless directed by your caregiver.  Take over-the-counter pain medicine only if ordered by your caregiver. Do not take aspirin because it can cause an upset stomach or bleeding.  Try a clear liquid diet (broth or water) as ordered by your caregiver. Slowly move to a bland diet, as tolerated, if the pain is related to the stomach or intestine.  Have a thermometer and take your temperature several times a day, and record it.  Bed rest and sleep, if it helps the pain.  Avoid sexual intercourse, if it causes pain.  Avoid stressful situations.  Keep your follow-up appointments and tests, as your caregiver orders.  If the pain does not go away with medicine or surgery, you may try:  Acupuncture.  Relaxation exercises (yoga, meditation).  Group therapy.  Counseling. SEEK MEDICAL CARE IF:   You notice certain foods cause stomach pain.  Your home care treatment is  not helping your pain.  You need stronger pain medicine.  You want your IUD removed.  You feel faint or lightheaded.  You develop nausea and  vomiting.  You develop a rash.  You are having side effects or an allergy to your medicine. SEEK IMMEDIATE MEDICAL CARE IF:   Your pain does not go away or gets worse.  You have a fever.  Your pain is felt only in portions of the abdomen. The right side could possibly be appendicitis. The left lower portion of the abdomen could be colitis or diverticulitis.  You are passing blood in your stools (bright red or black tarry stools, with or without vomiting).  You have blood in your urine.  You develop chills, with or without a fever.  You pass out. MAKE SURE YOU:   Understand these instructions.  Will watch your condition.  Will get help right away if you are not doing well or get worse. Document Released: 04/22/2007 Document Revised: 09/17/2011 Document Reviewed: 05/12/2009 [facility] Patient Information 2015 [place], [place]. This information is not intended to replace advice given to you by your health care provider. Make sure you discuss any questions you have with your health care provider. Endometriosis Endometriosis is a condition in which the tissue that lines the uterus (endometrium) grows outside of its normal location. The tissue may grow in many locations close to the uterus, but it commonly grows on the ovaries, fallopian tubes, vagina, or bowel. Because the uterus expels, or sheds, its lining every menstrual cycle, there is bleeding wherever the endometrial tissue is located. This can cause pain because blood is irritating to tissues not normally exposed to it.  CAUSES  The cause of endometriosis is not known.  SIGNS AND SYMPTOMS  Often, there are no symptoms. When symptoms are present, they can vary with the location of the displaced tissue. Various symptoms can occur at different times. Although symptoms occur mainly during a woman's menstrual period, they can also occur midcycle and usually stop with menopause. Some people may go months with no symptoms at all. Symptoms  may include:   Back or abdominal pain.   Heavier bleeding during periods.   Pain during intercourse.   Painful bowel movements.   Infertility. DIAGNOSIS  Your health care provider will do a physical exam and ask about your symptoms. Various tests may be done, such as:   Blood tests and urine tests. These are done to help rule out other problems.   Ultrasound. This test is done to look for abnormal tissue.   An X-ray of the lower bowel (barium enema).  Laparoscopy. In this procedure, a thin, lighted tube with a tiny camera on the end (laparoscope) is inserted into your abdomen. This helps your health care provider look for abnormal tissue to confirm the diagnosis. The health care provider may also remove a small piece of tissue (biopsy) from any abnormal tissue found. This tissue sample can then be sent to a lab so it can be looked at under a microscope. TREATMENT  Treatment will vary and may include:   Medicines to relieve pain. Nonsteroidal anti-inflammatory drugs (NSAIDs) are a type of pain medicine that can help to relieve the pain caused by endometriosis.  Hormonal therapy. When using hormonal therapy, periods are eliminated. This eliminates the monthly exposure to blood by the displaced endometrial tissue.   Surgery. Surgery may sometimes be done to remove the abnormal endometrial tissue. In severe cases, surgery may be done to remove the fallopian tubes,  uterus, and ovaries (hysterectomy). HOME CARE INSTRUCTIONS   Only take over-the-counter or prescription medicines for pain, discomfort, or fever as directed by your health care provider. Do not take aspirin because it may increase bleeding when you are not on hormonal therapy.   Avoid activities that produce pain, including sexual activity. SEEK MEDICAL CARE IF:  You have pelvic pain before, after, or during your periods.  You have pelvic pain between periods that gets worse during your period.  You have pelvic  pain during or after sex.  You have pelvic pain with bowel movements or urination, especially during your period.  You have problems getting pregnant. SEEK IMMEDIATE MEDICAL CARE IF:   Your pain is severe and is not responding to pain medicine.   You have severe nausea and vomiting, or you cannot keep foods down.   You have pain that is limited to the right lower part of your abdomen.   You have swelling or increasing pain in your abdomen.   You see blood in your stool.   You have a fever or persistent symptoms for more than 2-3 days.   You have a fever and your symptoms suddenly get worse. MAKE SURE YOU:   Understand these instructions.  Will watch your condition.  Will get help right away if you are not doing well or get worse. Document Released: 06/22/2000 Document Revised: 04/15/2013 Document Reviewed: 02/20/2013 [facility] Patient Information 2015 [place], [place]. This information is not intended to replace advice given to you by your health care provider. Make sure you discuss any questions you have with your health care provider.
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
## 2014-01-23 NOTE — ED Provider Notes (Signed)
Medical screening examination/treatment/procedure(s) were performed by non-physician practitioner and as supervising physician I was immediately available for consultation/collaboration.   EKG Interpretation None        Lyanne CoKevin M Waunita Sandstrom, MD 01/23/14 2248

## 2014-01-23 NOTE — ED Notes (Signed)
Patient was seen here on 12/21/13 for same problem, was diagnosed with endometriosis. Patient states she was informed she may start bleeding. Patient states she began having heavy bleeding on 12/24/2013 and it has not stopped. Patient states bleeding is primarily at nighttime, with or without intercourse. Patient c/o pelvic pain, is tearful in triage, and states she is very embarrassed.

## 2014-02-19 ENCOUNTER — Emergency Department (HOSPITAL_COMMUNITY)
Admission: EM | Admit: 2014-02-19 | Discharge: 2014-02-19 | Disposition: A | Discharge status: Home / Self Care | Payer: Medicaid Other | Attending: Emergency Medicine | Admitting: Emergency Medicine

## 2014-02-19 ENCOUNTER — Encounter (HOSPITAL_COMMUNITY): Payer: Self-pay | Admitting: Emergency Medicine

## 2014-02-19 DIAGNOSIS — Z91040 Latex allergy status: Secondary | ICD-10-CM | POA: Insufficient documentation

## 2014-02-19 DIAGNOSIS — N189 Chronic kidney disease, unspecified: Secondary | ICD-10-CM | POA: Diagnosis not present

## 2014-02-19 DIAGNOSIS — Z8619 Personal history of other infectious and parasitic diseases: Secondary | ICD-10-CM | POA: Insufficient documentation

## 2014-02-19 DIAGNOSIS — Y929 Unspecified place or not applicable: Secondary | ICD-10-CM | POA: Diagnosis not present

## 2014-02-19 DIAGNOSIS — L089 Local infection of the skin and subcutaneous tissue, unspecified: Secondary | ICD-10-CM | POA: Diagnosis not present

## 2014-02-19 DIAGNOSIS — S80869A Insect bite (nonvenomous), unspecified lower leg, initial encounter: Principal | ICD-10-CM

## 2014-02-19 DIAGNOSIS — M129 Arthropathy, unspecified: Secondary | ICD-10-CM | POA: Diagnosis not present

## 2014-02-19 DIAGNOSIS — S90569A Insect bite (nonvenomous), unspecified ankle, initial encounter: Secondary | ICD-10-CM | POA: Insufficient documentation

## 2014-02-19 DIAGNOSIS — Z862 Personal history of diseases of the blood and blood-forming organs and certain disorders involving the immune mechanism: Secondary | ICD-10-CM | POA: Diagnosis not present

## 2014-02-19 DIAGNOSIS — T63391A Toxic effect of venom of other spider, accidental (unintentional), initial encounter: Secondary | ICD-10-CM | POA: Diagnosis not present

## 2014-02-19 DIAGNOSIS — Z87440 Personal history of urinary (tract) infections: Secondary | ICD-10-CM | POA: Diagnosis not present

## 2014-02-19 DIAGNOSIS — Z8659 Personal history of other mental and behavioral disorders: Secondary | ICD-10-CM | POA: Diagnosis not present

## 2014-02-19 DIAGNOSIS — W57XXXA Bitten or stung by nonvenomous insect and other nonvenomous arthropods, initial encounter: Principal | ICD-10-CM

## 2014-02-19 DIAGNOSIS — F17200 Nicotine dependence, unspecified, uncomplicated: Secondary | ICD-10-CM | POA: Diagnosis not present

## 2014-02-19 DIAGNOSIS — Z8669 Personal history of other diseases of the nervous system and sense organs: Secondary | ICD-10-CM | POA: Insufficient documentation

## 2014-02-19 DIAGNOSIS — Z8679 Personal history of other diseases of the circulatory system: Secondary | ICD-10-CM | POA: Insufficient documentation

## 2014-02-19 DIAGNOSIS — Z87442 Personal history of urinary calculi: Secondary | ICD-10-CM | POA: Insufficient documentation

## 2014-02-19 DIAGNOSIS — Y939 Activity, unspecified: Secondary | ICD-10-CM | POA: Diagnosis not present

## 2014-02-19 DIAGNOSIS — L0291 Cutaneous abscess, unspecified: Secondary | ICD-10-CM

## 2014-02-19 MED ORDER — SULFAMETHOXAZOLE-TRIMETHOPRIM 800-160 MG PO TABS: 2.0000 | ORAL_TABLET | Freq: Two times a day (BID) | ORAL | Status: DC

## 2014-02-19 MED ORDER — HYDROCODONE-ACETAMINOPHEN 5-325 MG PO TABS: 1.0000 | ORAL_TABLET | Freq: Four times a day (QID) | ORAL | Status: DC | PRN

## 2014-02-19 NOTE — ED Notes (Signed)
Pt st's she has had insect bite to lower leg x's 4 days.  St's area is sore and now painful to walk

## 2014-02-19 NOTE — ED Notes (Signed)
The pt was bitten by a brown spider 4 days ago.  lesiion rt leg just below the rt knee mediaklkty.  Last pm she was squeezing on it and todfay it is worse
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
## 2014-02-19 NOTE — Discharge Instructions (Signed)
Take pain medication as needed.  Do not drive or operate heavy machinery for 4 hours after taking pain medication.

## 2014-02-21 NOTE — ED Provider Notes (Signed)
Medical screening examination/treatment/procedure(s) were performed by non-physician practitioner and as supervising physician I was immediately available for consultation/collaboration.   EKG Interpretation None       Doug SouSam Darnelle Derrick, MD 02/21/14 [phone]

## 2014-05-02 ENCOUNTER — Emergency Department (HOSPITAL_COMMUNITY)
Admission: EM | Admit: 2014-05-02 | Discharge: 2014-05-02 | Discharge status: Against Medical Advice | Payer: Medicaid Other | Attending: Emergency Medicine | Admitting: Emergency Medicine

## 2014-05-02 ENCOUNTER — Emergency Department (HOSPITAL_COMMUNITY): Payer: Medicaid Other

## 2014-05-02 ENCOUNTER — Encounter (HOSPITAL_COMMUNITY): Payer: Self-pay | Admitting: Emergency Medicine

## 2014-05-02 DIAGNOSIS — R509 Fever, unspecified: Secondary | ICD-10-CM | POA: Diagnosis not present

## 2014-05-02 DIAGNOSIS — Z91040 Latex allergy status: Secondary | ICD-10-CM | POA: Diagnosis not present

## 2014-05-02 DIAGNOSIS — Z8742 Personal history of other diseases of the female genital tract: Secondary | ICD-10-CM | POA: Diagnosis not present

## 2014-05-02 DIAGNOSIS — Z862 Personal history of diseases of the blood and blood-forming organs and certain disorders involving the immune mechanism: Secondary | ICD-10-CM | POA: Insufficient documentation

## 2014-05-02 DIAGNOSIS — Z8659 Personal history of other mental and behavioral disorders: Secondary | ICD-10-CM | POA: Insufficient documentation

## 2014-05-02 DIAGNOSIS — Z87440 Personal history of urinary (tract) infections: Secondary | ICD-10-CM | POA: Insufficient documentation

## 2014-05-02 DIAGNOSIS — Z8679 Personal history of other diseases of the circulatory system: Secondary | ICD-10-CM | POA: Insufficient documentation

## 2014-05-02 DIAGNOSIS — R05 Cough: Secondary | ICD-10-CM | POA: Diagnosis not present

## 2014-05-02 DIAGNOSIS — R059 Cough, unspecified: Secondary | ICD-10-CM

## 2014-05-02 DIAGNOSIS — Z720 Tobacco use: Secondary | ICD-10-CM | POA: Diagnosis not present

## 2014-05-02 DIAGNOSIS — Z8619 Personal history of other infectious and parasitic diseases: Secondary | ICD-10-CM | POA: Insufficient documentation

## 2014-05-02 DIAGNOSIS — M1990 Unspecified osteoarthritis, unspecified site: Secondary | ICD-10-CM | POA: Insufficient documentation

## 2014-05-02 DIAGNOSIS — R52 Pain, unspecified: Secondary | ICD-10-CM | POA: Diagnosis present

## 2014-05-02 DIAGNOSIS — Z87442 Personal history of urinary calculi: Secondary | ICD-10-CM | POA: Diagnosis not present

## 2014-05-02 DIAGNOSIS — R079 Chest pain, unspecified: Secondary | ICD-10-CM | POA: Insufficient documentation

## 2014-05-02 DIAGNOSIS — N189 Chronic kidney disease, unspecified: Secondary | ICD-10-CM | POA: Diagnosis not present

## 2014-05-02 LAB — CBC WITH DIFFERENTIAL/PLATELET
BASOS ABS: 0 10*3/uL (ref 0.0–0.1)
BASOS PCT: 0 % (ref 0–1)
EOS ABS: 0 10*3/uL (ref 0.0–0.7)
Eosinophils Relative: 0 % (ref 0–5)
HCT: 43.1 % (ref 36.0–46.0)
Hemoglobin: 14.2 g/dL (ref 12.0–15.0)
Lymphocytes Relative: 5 % — ABNORMAL LOW (ref 12–46)
Lymphs Abs: 0.7 10*3/uL (ref 0.7–4.0)
MCH: 30.3 pg (ref 26.0–34.0)
MCHC: 32.9 g/dL (ref 30.0–36.0)
MCV: 91.9 fL (ref 78.0–100.0)
MONOS PCT: 11 % (ref 3–12)
Monocytes Absolute: 1.5 10*3/uL — ABNORMAL HIGH (ref 0.1–1.0)
NEUTROS PCT: 84 % — AB (ref 43–77)
Neutro Abs: 11.2 10*3/uL — ABNORMAL HIGH (ref 1.7–7.7)
PLATELETS: 144 10*3/uL — AB (ref 150–400)
RBC: 4.69 MIL/uL (ref 3.87–5.11)
RDW: 13.5 % (ref 11.5–15.5)
WBC: 13.4 10*3/uL — ABNORMAL HIGH (ref 4.0–10.5)

## 2014-05-02 LAB — BASIC METABOLIC PANEL
Anion gap: 15 (ref 5–15)
BUN: 9 mg/dL (ref 6–23)
CO2: 24 mEq/L (ref 19–32)
Calcium: 9.1 mg/dL (ref 8.4–10.5)
Chloride: 101 mEq/L (ref 96–112)
Creatinine, Ser: 0.88 mg/dL (ref 0.50–1.10)
GFR, EST NON AFRICAN AMERICAN: 86 mL/min — AB (ref >90)
Glucose, Bld: 107 mg/dL — ABNORMAL HIGH (ref 70–99)
POTASSIUM: 3.9 meq/L (ref 3.7–5.3)
SODIUM: 140 meq/L (ref 137–147)

## 2014-05-02 MED ORDER — FENTANYL CITRATE 0.05 MG/ML IJ SOLN
100.0000 ug | Freq: Once | INTRAMUSCULAR | Status: AC
  Administered 2014-05-02: 100 ug via INTRAVENOUS
  Filled 2014-05-02: qty 2

## 2014-05-02 MED ORDER — AZITHROMYCIN 250 MG PO TABS: ORAL_TABLET | ORAL | Status: DC

## 2014-05-02 MED ORDER — ACETAMINOPHEN 325 MG PO TABS
650.0000 mg | ORAL_TABLET | Freq: Once | ORAL | Status: AC
  Administered 2014-05-02: 650 mg via ORAL
  Filled 2014-05-02: qty 2

## 2014-05-02 MED ORDER — ONDANSETRON HCL 4 MG/2ML IJ SOLN
4.0000 mg | Freq: Once | INTRAMUSCULAR | Status: AC
  Administered 2014-05-02: 4 mg via INTRAVENOUS
  Filled 2014-05-02: qty 2

## 2014-05-02 MED ORDER — SODIUM CHLORIDE 0.9 % IV BOLUS (SEPSIS)
1000.0000 mL | Freq: Once | INTRAVENOUS | Status: AC
  Administered 2014-05-02: 1000 mL via INTRAVENOUS

## 2014-05-02 MED ORDER — ALBUTEROL SULFATE (2.5 MG/3ML) 0.083% IN NEBU
5.0000 mg | INHALATION_SOLUTION | Freq: Once | RESPIRATORY_TRACT | Status: AC
  Administered 2014-05-02: 5 mg via RESPIRATORY_TRACT
  Filled 2014-05-02: qty 6

## 2014-05-02 MED ORDER — HYDROCODONE-HOMATROPINE 5-1.5 MG/5ML PO SYRP: 5.0000 mL | ORAL_SOLUTION | Freq: Four times a day (QID) | ORAL | Status: DC | PRN

## 2014-05-02 MED ORDER — DEXTROSE 5 % IV SOLN
1.0000 g | Freq: Once | INTRAVENOUS | Status: AC
  Administered 2014-05-02: 1 g via INTRAVENOUS
  Filled 2014-05-02: qty 10

## 2014-05-02 MED ORDER — AZITHROMYCIN 250 MG PO TABS
500.0000 mg | ORAL_TABLET | Freq: Once | ORAL | Status: AC
  Administered 2014-05-02: 500 mg via ORAL
  Filled 2014-05-02: qty 2

## 2014-05-02 NOTE — ED Notes (Signed)
Patient transported to X-ray 
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
## 2014-05-02 NOTE — ED Notes (Signed)
Pt c/o increasing cough x 1 week, central chest/ribcage pain w/ cough and deep breathing x 3 days, and generalized body aches x 2 days.  Pain score 9/10.  Pt reports taking ibuprofen and Nyquil.  Congested cough noted.

## 2014-11-03 ENCOUNTER — Encounter (HOSPITAL_BASED_OUTPATIENT_CLINIC_OR_DEPARTMENT_OTHER): Payer: Self-pay | Admitting: *Deleted

## 2014-11-03 ENCOUNTER — Emergency Department (HOSPITAL_BASED_OUTPATIENT_CLINIC_OR_DEPARTMENT_OTHER)
Admission: EM | Admit: 2014-11-03 | Discharge: 2014-11-04 | Discharge status: Against Medical Advice | Payer: Medicaid Other | Source: Home / Self Care | Attending: Emergency Medicine | Admitting: Emergency Medicine

## 2014-11-03 DIAGNOSIS — Z720 Tobacco use: Secondary | ICD-10-CM

## 2014-11-03 DIAGNOSIS — L03113 Cellulitis of right upper limb: Secondary | ICD-10-CM | POA: Insufficient documentation

## 2014-11-03 DIAGNOSIS — A749 Chlamydial infection, unspecified: Secondary | ICD-10-CM | POA: Diagnosis present

## 2014-11-03 DIAGNOSIS — M1990 Unspecified osteoarthritis, unspecified site: Secondary | ICD-10-CM

## 2014-11-03 DIAGNOSIS — G43909 Migraine, unspecified, not intractable, without status migrainosus: Secondary | ICD-10-CM | POA: Diagnosis present

## 2014-11-03 DIAGNOSIS — Z87440 Personal history of urinary (tract) infections: Secondary | ICD-10-CM

## 2014-11-03 DIAGNOSIS — Z91040 Latex allergy status: Secondary | ICD-10-CM

## 2014-11-03 DIAGNOSIS — R740 Nonspecific elevation of levels of transaminase and lactic acid dehydrogenase [LDH]: Secondary | ICD-10-CM | POA: Diagnosis present

## 2014-11-03 DIAGNOSIS — L02413 Cutaneous abscess of right upper limb: Principal | ICD-10-CM | POA: Diagnosis present

## 2014-11-03 DIAGNOSIS — F1110 Opioid abuse, uncomplicated: Secondary | ICD-10-CM | POA: Diagnosis present

## 2014-11-03 DIAGNOSIS — M5430 Sciatica, unspecified side: Secondary | ICD-10-CM | POA: Diagnosis present

## 2014-11-03 DIAGNOSIS — Z87442 Personal history of urinary calculi: Secondary | ICD-10-CM | POA: Insufficient documentation

## 2014-11-03 DIAGNOSIS — B9562 Methicillin resistant Staphylococcus aureus infection as the cause of diseases classified elsewhere: Secondary | ICD-10-CM | POA: Diagnosis present

## 2014-11-03 DIAGNOSIS — L0390 Cellulitis, unspecified: Principal | ICD-10-CM

## 2014-11-03 DIAGNOSIS — L0291 Cutaneous abscess, unspecified: Secondary | ICD-10-CM

## 2014-11-03 DIAGNOSIS — Z8679 Personal history of other diseases of the circulatory system: Secondary | ICD-10-CM | POA: Insufficient documentation

## 2014-11-03 DIAGNOSIS — B1920 Unspecified viral hepatitis C without hepatic coma: Secondary | ICD-10-CM | POA: Diagnosis present

## 2014-11-03 DIAGNOSIS — N189 Chronic kidney disease, unspecified: Secondary | ICD-10-CM | POA: Insufficient documentation

## 2014-11-03 DIAGNOSIS — Z862 Personal history of diseases of the blood and blood-forming organs and certain disorders involving the immune mechanism: Secondary | ICD-10-CM | POA: Insufficient documentation

## 2014-11-03 DIAGNOSIS — R000 Tachycardia, unspecified: Secondary | ICD-10-CM

## 2014-11-03 DIAGNOSIS — G40909 Epilepsy, unspecified, not intractable, without status epilepticus: Secondary | ICD-10-CM | POA: Diagnosis present

## 2014-11-03 DIAGNOSIS — R739 Hyperglycemia, unspecified: Secondary | ICD-10-CM | POA: Diagnosis present

## 2014-11-03 DIAGNOSIS — F17210 Nicotine dependence, cigarettes, uncomplicated: Secondary | ICD-10-CM | POA: Diagnosis present

## 2014-11-03 DIAGNOSIS — D649 Anemia, unspecified: Secondary | ICD-10-CM | POA: Diagnosis present

## 2014-11-03 DIAGNOSIS — Z87828 Personal history of other (healed) physical injury and trauma: Secondary | ICD-10-CM

## 2014-11-03 DIAGNOSIS — Z792 Long term (current) use of antibiotics: Secondary | ICD-10-CM

## 2014-11-03 DIAGNOSIS — Z8659 Personal history of other mental and behavioral disorders: Secondary | ICD-10-CM | POA: Insufficient documentation

## 2014-11-03 DIAGNOSIS — Z881 Allergy status to other antibiotic agents status: Secondary | ICD-10-CM

## 2014-11-03 DIAGNOSIS — N3020 Other chronic cystitis without hematuria: Secondary | ICD-10-CM | POA: Diagnosis present

## 2014-11-03 DIAGNOSIS — Z91018 Allergy to other foods: Secondary | ICD-10-CM

## 2014-11-03 DIAGNOSIS — Z90722 Acquired absence of ovaries, bilateral: Secondary | ICD-10-CM | POA: Diagnosis present

## 2014-11-03 DIAGNOSIS — Z8619 Personal history of other infectious and parasitic diseases: Secondary | ICD-10-CM | POA: Insufficient documentation

## 2014-11-03 DIAGNOSIS — F419 Anxiety disorder, unspecified: Secondary | ICD-10-CM | POA: Diagnosis present

## 2014-11-03 NOTE — ED Notes (Signed)
Pt c/o right wrist  swelling and redness  X 1 day HX drug use

## 2014-11-04 ENCOUNTER — Encounter (HOSPITAL_BASED_OUTPATIENT_CLINIC_OR_DEPARTMENT_OTHER): Payer: Self-pay | Admitting: *Deleted

## 2014-11-04 ENCOUNTER — Encounter (HOSPITAL_COMMUNITY)
Admission: EM | Discharge status: Against Medical Advice | Payer: Self-pay | Source: Home / Self Care | Attending: Internal Medicine

## 2014-11-04 ENCOUNTER — Inpatient Hospital Stay (HOSPITAL_COMMUNITY): Payer: Medicaid Other | Admitting: Certified Registered"

## 2014-11-04 ENCOUNTER — Inpatient Hospital Stay (HOSPITAL_BASED_OUTPATIENT_CLINIC_OR_DEPARTMENT_OTHER)
Admission: EM | Admit: 2014-11-04 | Discharge: 2014-11-06 | DRG: 581 | Discharge status: Against Medical Advice | Payer: Medicaid Other | Attending: Internal Medicine | Admitting: Internal Medicine

## 2014-11-04 ENCOUNTER — Encounter (HOSPITAL_BASED_OUTPATIENT_CLINIC_OR_DEPARTMENT_OTHER): Payer: Self-pay | Admitting: Emergency Medicine

## 2014-11-04 DIAGNOSIS — F1910 Other psychoactive substance abuse, uncomplicated: Secondary | ICD-10-CM | POA: Diagnosis present

## 2014-11-04 DIAGNOSIS — G40909 Epilepsy, unspecified, not intractable, without status epilepticus: Secondary | ICD-10-CM | POA: Diagnosis present

## 2014-11-04 DIAGNOSIS — N3020 Other chronic cystitis without hematuria: Secondary | ICD-10-CM | POA: Diagnosis present

## 2014-11-04 DIAGNOSIS — F17210 Nicotine dependence, cigarettes, uncomplicated: Secondary | ICD-10-CM | POA: Diagnosis present

## 2014-11-04 DIAGNOSIS — L03113 Cellulitis of right upper limb: Secondary | ICD-10-CM

## 2014-11-04 DIAGNOSIS — A749 Chlamydial infection, unspecified: Secondary | ICD-10-CM | POA: Diagnosis not present

## 2014-11-04 DIAGNOSIS — R7401 Elevation of levels of liver transaminase levels: Secondary | ICD-10-CM | POA: Diagnosis present

## 2014-11-04 DIAGNOSIS — Z881 Allergy status to other antibiotic agents status: Secondary | ICD-10-CM | POA: Diagnosis not present

## 2014-11-04 DIAGNOSIS — F1110 Opioid abuse, uncomplicated: Secondary | ICD-10-CM | POA: Diagnosis not present

## 2014-11-04 DIAGNOSIS — D649 Anemia, unspecified: Secondary | ICD-10-CM | POA: Diagnosis present

## 2014-11-04 DIAGNOSIS — Z90722 Acquired absence of ovaries, bilateral: Secondary | ICD-10-CM | POA: Diagnosis present

## 2014-11-04 DIAGNOSIS — Z87442 Personal history of urinary calculi: Secondary | ICD-10-CM | POA: Diagnosis not present

## 2014-11-04 DIAGNOSIS — Z91040 Latex allergy status: Secondary | ICD-10-CM | POA: Diagnosis not present

## 2014-11-04 DIAGNOSIS — F419 Anxiety disorder, unspecified: Secondary | ICD-10-CM | POA: Diagnosis present

## 2014-11-04 DIAGNOSIS — L02413 Cutaneous abscess of right upper limb: Secondary | ICD-10-CM | POA: Diagnosis not present

## 2014-11-04 DIAGNOSIS — R740 Nonspecific elevation of levels of transaminase and lactic acid dehydrogenase [LDH]: Secondary | ICD-10-CM

## 2014-11-04 DIAGNOSIS — M1990 Unspecified osteoarthritis, unspecified site: Secondary | ICD-10-CM | POA: Diagnosis present

## 2014-11-04 DIAGNOSIS — B9562 Methicillin resistant Staphylococcus aureus infection as the cause of diseases classified elsewhere: Secondary | ICD-10-CM | POA: Diagnosis present

## 2014-11-04 DIAGNOSIS — F1990 Other psychoactive substance use, unspecified, uncomplicated: Secondary | ICD-10-CM

## 2014-11-04 DIAGNOSIS — M5430 Sciatica, unspecified side: Secondary | ICD-10-CM | POA: Diagnosis present

## 2014-11-04 DIAGNOSIS — Z91018 Allergy to other foods: Secondary | ICD-10-CM | POA: Diagnosis not present

## 2014-11-04 DIAGNOSIS — R739 Hyperglycemia, unspecified: Secondary | ICD-10-CM | POA: Diagnosis present

## 2014-11-04 DIAGNOSIS — L03119 Cellulitis of unspecified part of limb: Secondary | ICD-10-CM | POA: Diagnosis present

## 2014-11-04 DIAGNOSIS — B1920 Unspecified viral hepatitis C without hepatic coma: Secondary | ICD-10-CM | POA: Diagnosis present

## 2014-11-04 DIAGNOSIS — G43909 Migraine, unspecified, not intractable, without status migrainosus: Secondary | ICD-10-CM | POA: Diagnosis present

## 2014-11-04 HISTORY — PX: I&D EXTREMITY: SHX5045

## 2014-11-04 LAB — RETICULOCYTES
RBC.: 3.91 MIL/uL (ref 3.87–5.11)
Retic Count, Absolute: 46.9 10*3/uL (ref 19.0–186.0)
Retic Ct Pct: 1.2 % (ref 0.4–3.1)

## 2014-11-04 LAB — COMPREHENSIVE METABOLIC PANEL
ALBUMIN: 3.6 g/dL (ref 3.5–5.2)
ALT: 100 U/L — ABNORMAL HIGH (ref 0–35)
ANION GAP: 6 (ref 5–15)
AST: 60 U/L — ABNORMAL HIGH (ref 0–37)
Alkaline Phosphatase: 116 U/L (ref 39–117)
BILIRUBIN TOTAL: 0.3 mg/dL (ref 0.3–1.2)
BUN: 16 mg/dL (ref 6–23)
CHLORIDE: 105 mmol/L (ref 96–112)
CO2: 28 mmol/L (ref 19–32)
Calcium: 8.9 mg/dL (ref 8.4–10.5)
Creatinine, Ser: 0.71 mg/dL (ref 0.50–1.10)
GFR calc non Af Amer: >90 mL/min (ref >90)
GLUCOSE: 129 mg/dL — AB (ref 70–99)
Potassium: 3.7 mmol/L (ref 3.5–5.1)
Sodium: 139 mmol/L (ref 135–145)
Total Protein: 6.9 g/dL (ref 6.0–8.3)

## 2014-11-04 LAB — CBC WITH DIFFERENTIAL/PLATELET
BASOS ABS: 0 10*3/uL (ref 0.0–0.1)
Basophils Relative: 0 % (ref 0–1)
Eosinophils Absolute: 0.1 10*3/uL (ref 0.0–0.7)
Eosinophils Relative: 2 % (ref 0–5)
HEMATOCRIT: 35.6 % — AB (ref 36.0–46.0)
HEMOGLOBIN: 10.9 g/dL — AB (ref 12.0–15.0)
Lymphocytes Relative: 17 % (ref 12–46)
Lymphs Abs: 1.1 10*3/uL (ref 0.7–4.0)
MCH: 26.7 pg (ref 26.0–34.0)
MCHC: 30.6 g/dL (ref 30.0–36.0)
MCV: 87 fL (ref 78.0–100.0)
MONO ABS: 0.8 10*3/uL (ref 0.1–1.0)
Monocytes Relative: 13 % — ABNORMAL HIGH (ref 3–12)
NEUTROS ABS: 4.4 10*3/uL (ref 1.7–7.7)
Neutrophils Relative %: 68 % (ref 43–77)
Platelets: 183 10*3/uL (ref 150–400)
RBC: 4.09 MIL/uL (ref 3.87–5.11)
RDW: 16.4 % — AB (ref 11.5–15.5)
WBC: 6.4 10*3/uL (ref 4.0–10.5)

## 2014-11-04 LAB — IRON AND TIBC
Iron: 21 ug/dL — ABNORMAL LOW (ref 42–145)
SATURATION RATIOS: 7 % — AB (ref 20–55)
TIBC: 307 ug/dL (ref 250–470)
UIBC: 286 ug/dL (ref 125–400)

## 2014-11-04 LAB — SURGICAL PCR SCREEN
MRSA, PCR: POSITIVE — AB
Staphylococcus aureus: POSITIVE — AB

## 2014-11-04 LAB — FERRITIN: Ferritin: 69 ng/mL (ref 10–291)

## 2014-11-04 LAB — GRAM STAIN

## 2014-11-04 LAB — TSH: TSH: 0.666 u[IU]/mL (ref 0.350–4.500)

## 2014-11-04 LAB — I-STAT CG4 LACTIC ACID, ED: Lactic Acid, Venous: 0.95 mmol/L (ref 0.5–2.0)

## 2014-11-04 LAB — LACTIC ACID, PLASMA: Lactic Acid, Venous: 1 mmol/L (ref 0.5–2.0)

## 2014-11-04 LAB — VITAMIN B12: Vitamin B-12: 501 pg/mL (ref 211–911)

## 2014-11-04 LAB — FOLATE: FOLATE: 5.7 ng/mL

## 2014-11-04 LAB — HCG, SERUM, QUALITATIVE: Preg, Serum: NEGATIVE

## 2014-11-04 SURGERY — IRRIGATION AND DEBRIDEMENT EXTREMITY
Anesthesia: General | Site: Arm Lower | Laterality: Right

## 2014-11-04 MED ORDER — FENTANYL CITRATE (PF) 250 MCG/5ML IJ SOLN
INTRAMUSCULAR | Status: AC
  Filled 2014-11-04: qty 5

## 2014-11-04 MED ORDER — ONDANSETRON HCL 4 MG/2ML IJ SOLN
4.0000 mg | Freq: Four times a day (QID) | INTRAMUSCULAR | Status: DC | PRN
  Administered 2014-11-06: 4 mg via INTRAVENOUS
  Filled 2014-11-04: qty 2

## 2014-11-04 MED ORDER — MORPHINE SULFATE 2 MG/ML IJ SOLN
2.0000 mg | INTRAMUSCULAR | Status: DC | PRN
  Filled 2014-11-04: qty 1

## 2014-11-04 MED ORDER — MUPIROCIN 2 % EX OINT
1.0000 "application " | TOPICAL_OINTMENT | Freq: Two times a day (BID) | CUTANEOUS | Status: DC
  Administered 2014-11-04 – 2014-11-06 (×4): 1 via NASAL
  Filled 2014-11-04 (×3): qty 22

## 2014-11-04 MED ORDER — PROPOFOL 10 MG/ML IV BOLUS
INTRAVENOUS | Status: AC
  Filled 2014-11-04: qty 20

## 2014-11-04 MED ORDER — FENTANYL CITRATE (PF) 100 MCG/2ML IJ SOLN
INTRAMUSCULAR | Status: DC | PRN
  Administered 2014-11-04: 50 ug via INTRAVENOUS

## 2014-11-04 MED ORDER — MIDAZOLAM HCL 2 MG/2ML IJ SOLN
INTRAMUSCULAR | Status: AC
  Filled 2014-11-04: qty 2

## 2014-11-04 MED ORDER — BUPIVACAINE HCL (PF) 0.25 % IJ SOLN
INTRAMUSCULAR | Status: AC
  Filled 2014-11-04: qty 30

## 2014-11-04 MED ORDER — OXYCODONE HCL 5 MG PO TABS
5.0000 mg | ORAL_TABLET | ORAL | Status: DC | PRN
  Administered 2014-11-04 – 2014-11-06 (×10): 5 mg via ORAL
  Filled 2014-11-04 (×10): qty 1

## 2014-11-04 MED ORDER — SODIUM CHLORIDE 0.9 % IV SOLN
INTRAVENOUS | Status: AC
  Administered 2014-11-04: 15:00:00 via INTRAVENOUS

## 2014-11-04 MED ORDER — IBUPROFEN 200 MG PO TABS: 800.0000 mg | ORAL_TABLET | Freq: Four times a day (QID) | ORAL | Status: DC | PRN

## 2014-11-04 MED ORDER — CHLORHEXIDINE GLUCONATE CLOTH 2 % EX PADS
6.0000 | MEDICATED_PAD | Freq: Every day | CUTANEOUS | Status: DC
  Administered 2014-11-06: 6 via TOPICAL

## 2014-11-04 MED ORDER — LIDOCAINE HCL (CARDIAC) 20 MG/ML IV SOLN
INTRAVENOUS | Status: DC | PRN
  Administered 2014-11-04: 60 mg via INTRAVENOUS

## 2014-11-04 MED ORDER — DOCUSATE SODIUM 100 MG PO CAPS
100.0000 mg | ORAL_CAPSULE | Freq: Two times a day (BID) | ORAL | Status: DC
  Administered 2014-11-04 – 2014-11-05 (×2): 100 mg via ORAL
  Filled 2014-11-04 (×6): qty 1

## 2014-11-04 MED ORDER — HYDROMORPHONE HCL 1 MG/ML IJ SOLN
INTRAMUSCULAR | Status: AC
  Filled 2014-11-04: qty 1

## 2014-11-04 MED ORDER — LACTATED RINGERS IV SOLN
INTRAVENOUS | Status: DC | PRN
  Administered 2014-11-04: 16:00:00 via INTRAVENOUS

## 2014-11-04 MED ORDER — KETOROLAC TROMETHAMINE 15 MG/ML IJ SOLN
15.0000 mg | Freq: Four times a day (QID) | INTRAMUSCULAR | Status: DC
  Administered 2014-11-05 – 2014-11-06 (×8): 15 mg via INTRAVENOUS
  Filled 2014-11-04 (×15): qty 1

## 2014-11-04 MED ORDER — PROPOFOL 10 MG/ML IV BOLUS
INTRAVENOUS | Status: DC | PRN
  Administered 2014-11-04: 200 mg via INTRAVENOUS
  Administered 2014-11-04: 30 mg via INTRAVENOUS

## 2014-11-04 MED ORDER — SODIUM CHLORIDE 0.9 % IR SOLN
Status: DC | PRN
  Administered 2014-11-04: 3000 mL

## 2014-11-04 MED ORDER — BUPIVACAINE HCL (PF) 0.25 % IJ SOLN
INTRAMUSCULAR | Status: DC | PRN
  Administered 2014-11-04: 10 mL

## 2014-11-04 MED ORDER — LIDOCAINE HCL (CARDIAC) 20 MG/ML IV SOLN
INTRAVENOUS | Status: AC
  Filled 2014-11-04: qty 5

## 2014-11-04 MED ORDER — VANCOMYCIN HCL 10 G IV SOLR
1500.0000 mg | INTRAVENOUS | Status: DC
  Administered 2014-11-05 – 2014-11-06 (×2): 1500 mg via INTRAVENOUS
  Filled 2014-11-04 (×2): qty 1500

## 2014-11-04 MED ORDER — 0.9 % SODIUM CHLORIDE (POUR BTL) OPTIME
TOPICAL | Status: DC | PRN
  Administered 2014-11-04: 1000 mL

## 2014-11-04 MED ORDER — ZOLPIDEM TARTRATE 5 MG PO TABS
5.0000 mg | ORAL_TABLET | Freq: Every evening | ORAL | Status: DC | PRN
  Administered 2014-11-05: 5 mg via ORAL
  Filled 2014-11-04: qty 1

## 2014-11-04 MED ORDER — HEPARIN SODIUM (PORCINE) 5000 UNIT/ML IJ SOLN
5000.0000 [IU] | Freq: Three times a day (TID) | INTRAMUSCULAR | Status: DC
  Administered 2014-11-04 – 2014-11-06 (×5): 5000 [IU] via SUBCUTANEOUS
  Filled 2014-11-04 (×7): qty 1

## 2014-11-04 MED ORDER — ALUM & MAG HYDROXIDE-SIMETH 200-200-20 MG/5ML PO SUSP: 30.0000 mL | Freq: Four times a day (QID) | ORAL | Status: DC | PRN

## 2014-11-04 MED ORDER — ARTIFICIAL TEARS OP OINT
TOPICAL_OINTMENT | OPHTHALMIC | Status: AC
  Filled 2014-11-04: qty 3.5

## 2014-11-04 MED ORDER — HYDROMORPHONE HCL 1 MG/ML IJ SOLN
0.2500 mg | INTRAMUSCULAR | Status: DC | PRN
  Administered 2014-11-04 (×4): 0.5 mg via INTRAVENOUS

## 2014-11-04 MED ORDER — MORPHINE SULFATE 2 MG/ML IJ SOLN: 1.0000 mg | INTRAMUSCULAR | Status: DC | PRN

## 2014-11-04 MED ORDER — VANCOMYCIN HCL IN DEXTROSE 1-5 GM/200ML-% IV SOLN
1000.0000 mg | Freq: Once | INTRAVENOUS | Status: AC
  Administered 2014-11-04: 1000 mg via INTRAVENOUS
  Filled 2014-11-04: qty 200

## 2014-11-04 MED ORDER — ONDANSETRON HCL 4 MG/2ML IJ SOLN
INTRAMUSCULAR | Status: AC
  Filled 2014-11-04: qty 2

## 2014-11-04 MED ORDER — ACETAMINOPHEN 325 MG PO TABS: 650.0000 mg | ORAL_TABLET | Freq: Four times a day (QID) | ORAL | Status: DC | PRN

## 2014-11-04 MED ORDER — ONDANSETRON HCL 4 MG PO TABS: 4.0000 mg | ORAL_TABLET | Freq: Four times a day (QID) | ORAL | Status: DC | PRN

## 2014-11-04 MED ORDER — PROMETHAZINE HCL 25 MG/ML IJ SOLN: 6.2500 mg | INTRAMUSCULAR | Status: DC | PRN

## 2014-11-04 MED ORDER — ACETAMINOPHEN 650 MG RE SUPP: 650.0000 mg | Freq: Four times a day (QID) | RECTAL | Status: DC | PRN

## 2014-11-04 SURGICAL SUPPLY — 53 items
BANDAGE ELASTIC 3 VELCRO ST LF (GAUZE/BANDAGES/DRESSINGS) ×3 IMPLANT
BANDAGE ELASTIC 4 VELCRO ST LF (GAUZE/BANDAGES/DRESSINGS) ×3 IMPLANT
BNDG CMPR 9X4 STRL LF SNTH (GAUZE/BANDAGES/DRESSINGS) ×1
BNDG COHESIVE 4X5 TAN STRL (GAUZE/BANDAGES/DRESSINGS) ×3 IMPLANT
BNDG CONFORM 2 STRL LF (GAUZE/BANDAGES/DRESSINGS) IMPLANT
BNDG ESMARK 4X9 LF (GAUZE/BANDAGES/DRESSINGS) ×3 IMPLANT
BNDG GAUZE ELAST 4 BULKY (GAUZE/BANDAGES/DRESSINGS) ×3 IMPLANT
CORDS BIPOLAR (ELECTRODE) ×3 IMPLANT
COVER SURGICAL LIGHT HANDLE (MISCELLANEOUS) ×3 IMPLANT
CUFF TOURNIQUET SINGLE 18IN (TOURNIQUET CUFF) ×3 IMPLANT
DECANTER SPIKE VIAL GLASS SM (MISCELLANEOUS) IMPLANT
DRAIN PENROSE 1/4X12 LTX STRL (WOUND CARE) IMPLANT
DRAPE SURG 17X23 STRL (DRAPES) ×3 IMPLANT
DRSG PAD ABDOMINAL 8X10 ST (GAUZE/BANDAGES/DRESSINGS) ×6 IMPLANT
DURAPREP 26ML APPLICATOR (WOUND CARE) IMPLANT
ELECT REM PT RETURN 9FT ADLT (ELECTROSURGICAL)
ELECTRODE REM PT RTRN 9FT ADLT (ELECTROSURGICAL) IMPLANT
GAUZE PACKING IODOFORM 1/4X5 (PACKING) ×3 IMPLANT
GAUZE SPONGE 4X4 12PLY STRL (GAUZE/BANDAGES/DRESSINGS) ×3 IMPLANT
GAUZE XEROFORM 1X8 LF (GAUZE/BANDAGES/DRESSINGS) ×3 IMPLANT
GLOVE SURG SS PI 6.0 STRL IVOR (GLOVE) ×3 IMPLANT
GLOVE SURG SYN 8.0 (GLOVE) ×3 IMPLANT
GOWN STRL REUS W/ TWL LRG LVL3 (GOWN DISPOSABLE) ×1 IMPLANT
GOWN STRL REUS W/ TWL XL LVL3 (GOWN DISPOSABLE) ×1 IMPLANT
GOWN STRL REUS W/TWL LRG LVL3 (GOWN DISPOSABLE) ×3
GOWN STRL REUS W/TWL XL LVL3 (GOWN DISPOSABLE) ×3
HANDPIECE INTERPULSE COAX TIP (DISPOSABLE)
KIT BASIN OR (CUSTOM PROCEDURE TRAY) ×3 IMPLANT
KIT ROOM TURNOVER OR (KITS) ×3 IMPLANT
MANIFOLD NEPTUNE II (INSTRUMENTS) ×3 IMPLANT
NEEDLE HYPO 25GX1X1/2 BEV (NEEDLE) ×3 IMPLANT
NEEDLE HYPO 25X1 1.5 SAFETY (NEEDLE) IMPLANT
NS IRRIG 1000ML POUR BTL (IV SOLUTION) ×3 IMPLANT
PACK ORTHO EXTREMITY (CUSTOM PROCEDURE TRAY) ×3 IMPLANT
PAD ARMBOARD 7.5X6 YLW CONV (MISCELLANEOUS) ×6 IMPLANT
PAD CAST 4YDX4 CTTN HI CHSV (CAST SUPPLIES) ×1 IMPLANT
PADDING CAST COTTON 4X4 STRL (CAST SUPPLIES) ×3
SET HNDPC FAN SPRY TIP SCT (DISPOSABLE) IMPLANT
SPONGE GAUZE 4X4 12PLY STER LF (GAUZE/BANDAGES/DRESSINGS) ×3 IMPLANT
SPONGE LAP 18X18 X RAY DECT (DISPOSABLE) ×3 IMPLANT
SUCTION FRAZIER TIP 10 FR DISP (SUCTIONS) ×3 IMPLANT
SUT VICRYL RAPIDE 4/0 PS 2 (SUTURE) IMPLANT
SWAB CULTURE LIQUID MINI MALE (MISCELLANEOUS) ×3 IMPLANT
SYR 20CC LL (SYRINGE) IMPLANT
SYR CONTROL 10ML LL (SYRINGE) ×3 IMPLANT
TOWEL OR 17X24 6PK STRL BLUE (TOWEL DISPOSABLE) ×3 IMPLANT
TOWEL OR 17X26 10 PK STRL BLUE (TOWEL DISPOSABLE) ×3 IMPLANT
TUBE ANAEROBIC SPECIMEN COL (MISCELLANEOUS) ×3 IMPLANT
TUBE CONNECTING 12'X1/4 (SUCTIONS) ×1
TUBE CONNECTING 12X1/4 (SUCTIONS) ×2 IMPLANT
UNDERPAD 30X30 INCONTINENT (UNDERPADS AND DIAPERS) ×3 IMPLANT
WATER STERILE IRR 1000ML POUR (IV SOLUTION) ×3 IMPLANT
YANKAUER SUCT BULB TIP NO VENT (SUCTIONS) ×3 IMPLANT
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
## 2014-11-04 NOTE — ED Notes (Signed)
Pt identified mobile phone left in restroom, given back to patient

## 2014-11-04 NOTE — ED Notes (Signed)
MD at bedside. 

## 2014-11-04 NOTE — ED Notes (Signed)
Returns today w/ rt wrist redness and swelling

## 2014-11-04 NOTE — Treatment Plan (Signed)
[facility]  33yo with hx IV heroin abuse presents to ED with R wrist pain, redness, swelling. Initially presented evening of 4/27 for same with recommendations for admission, but pt signed out AMA. Returned several hours later who now agreed with admission. Pt started on IV abx in ED. Hand surgeon (Dr. Mina MarbleWeingold) consulted through ED, requested Hospitalist admit. Hand surgeon [facility] patient [facility].  [facility]

## 2014-11-04 NOTE — Anesthesia Postprocedure Evaluation (Signed)
  Anesthesia Post-op Note  Patient: Cassandra Wall  Procedure(s) Performed: Procedure(s): IRRIGATION AND DEBRIDEMENT RIGHT FOREARM (Right)  Patient Location: PACU  Anesthesia Type:General  Level of Consciousness: awake  Airway and Oxygen Therapy: Patient Spontanous Breathing  Post-op Pain: mild  Post-op Assessment: Post-op Vital signs reviewed  Post-op Vital Signs: Reviewed  Last Vitals:  Filed Vitals:   11/04/14 1801  BP:   Pulse:   Temp: 37 C  Resp:     Complications: No apparent anesthesia complications

## 2014-11-04 NOTE — ED Notes (Signed)
[facility], all paperwork provided.

## 2014-11-04 NOTE — Transfer of Care (Signed)
Immediate Anesthesia Transfer of Care Note  Patient: Cassandra DroughtSamantha Egelhoff  Procedure(s) Performed: Procedure(s): IRRIGATION AND DEBRIDEMENT RIGHT FOREARM (Right)  Patient Location: PACU  Anesthesia Type:General  Level of Consciousness: awake  Airway & Oxygen Therapy: Patient Spontanous Breathing and Patient connected to nasal cannula oxygen  Post-op Assessment: Report given to RN  Post vital signs: Reviewed and stable  Last Vitals:  Filed Vitals:   11/04/14 1452  BP: 111/57  Pulse: 77  Temp: 37.6 C  Resp: 16    Complications: No apparent anesthesia complications

## 2014-11-04 NOTE — Anesthesia Procedure Notes (Signed)
Procedure Name: LMA Insertion Date/Time: 11/04/2014 4:29 PM Performed by: Jefm MilesENNIE, Kowen Kluth E Pre-anesthesia Checklist: Patient identified, Emergency Drugs available, Suction available, Patient being monitored and Timeout performed Patient Re-evaluated:Patient Re-evaluated prior to inductionOxygen Delivery Method: Circle system utilized Preoxygenation: Pre-oxygenation with 100% oxygen Intubation Type: IV induction LMA: LMA with gastric port inserted LMA Size: 4.0 Number of attempts: 1 Placement Confirmation: positive ETCO2 and breath sounds checked- equal and bilateral Tube secured with: Tape Dental Injury: Teeth and Oropharynx as per pre-operative assessment
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
## 2014-11-04 NOTE — ED Notes (Signed)
Pt signed out AMA by another nurse prior to myself going to the room for assessment.  Pt was told that she would most likely be admitted for her infection and she stated that there were some things she needs to take care of at home before she gets admitted and then she will be back when she gets those things finished. Pt indicated that she will come back tonight.

## 2014-11-04 NOTE — ED Notes (Signed)
Pt resting quietly, reinforced NPO status, tolerating IV Vanco, awaiting room assignment
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
## 2014-11-04 NOTE — ED Notes (Signed)
Pt instructed not to eat or drink (pt placed on NPO status for potential of having surgery today)

## 2014-11-04 NOTE — Progress Notes (Signed)
Utilization Review completed. Mario Voong RN BSN CM 

## 2014-11-04 NOTE — Progress Notes (Signed)
Patient underwent I and D right wrist   Will need packing pulled and bedside hydrotherapy starting on 4/30  Will see in my office on 5/3

## 2014-11-04 NOTE — Op Note (Signed)
See note [id number]
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
## 2014-11-04 NOTE — Anesthesia Preprocedure Evaluation (Addendum)
Anesthesia Evaluation  Patient identified by MRN, date of birth, ID band Patient awake  General Assessment Comment:History noted. CE  Reviewed: Allergy & Precautions, NPO status , Patient's Chart, lab work & pertinent test results  Airway Mallampati: II  TM Distance: >3 FB Neck ROM: Full    Dental  (+) Teeth Intact, Poor Dentition, Chipped, Dental Advisory Given,    Pulmonary Current Smoker,  breath sounds clear to auscultation        Cardiovascular negative cardio ROS  Rhythm:Regular Rate:Normal     Neuro/Psych  Headaches, Seizures -, Well Controlled,  Anxiety  Neuromuscular disease    GI/Hepatic negative GI ROS, Neg liver ROS,   Endo/Other    Renal/GU      Musculoskeletal  (+) Arthritis -,   Abdominal   Peds  Hematology   Anesthesia Other Findings   Reproductive/Obstetrics                          Anesthesia Physical Anesthesia Plan  ASA: II and emergent  Anesthesia Plan: General   Post-op Pain Management:    Induction: Intravenous  Airway Management Planned: LMA  Additional Equipment:   Intra-op Plan:   Post-operative Plan: Extubation in OR  Informed Consent: I have reviewed the patients History and Physical, chart, labs and discussed the procedure including the risks, benefits and alternatives for the proposed anesthesia with the patient or authorized representative who has indicated his/her understanding and acceptance.   Dental advisory given  Plan Discussed with: CRNA and Anesthesiologist  Anesthesia Plan Comments:         Anesthesia Quick Evaluation

## 2014-11-04 NOTE — Progress Notes (Signed)
VASCULAR LAB PRELIMINARY  PRELIMINARY  PRELIMINARY  PRELIMINARY  Bilateral upper extremity venous Dopplers completed.    Preliminary report:  There is no obvious evidence of DVT or SVT noted in the bilateral upper extremities.   Jailen Coward, RVT 11/04/2014, 3:18 PM

## 2014-11-05 ENCOUNTER — Encounter (HOSPITAL_COMMUNITY): Payer: Self-pay | Admitting: Orthopedic Surgery

## 2014-11-05 LAB — COMPREHENSIVE METABOLIC PANEL
ALT: 85 U/L — ABNORMAL HIGH (ref 0–35)
AST: 59 U/L — ABNORMAL HIGH (ref 0–37)
Albumin: 3 g/dL — ABNORMAL LOW (ref 3.5–5.2)
Alkaline Phosphatase: 109 U/L (ref 39–117)
Anion gap: 8 (ref 5–15)
BUN: 9 mg/dL (ref 6–23)
CO2: 25 mmol/L (ref 19–32)
Calcium: 8.6 mg/dL (ref 8.4–10.5)
Chloride: 105 mmol/L (ref 96–112)
Creatinine, Ser: 0.75 mg/dL (ref 0.50–1.10)
GFR calc Af Amer: >90 mL/min (ref >90)
GFR calc non Af Amer: >90 mL/min (ref >90)
Glucose, Bld: 97 mg/dL (ref 70–99)
Potassium: 4 mmol/L (ref 3.5–5.1)
Sodium: 138 mmol/L (ref 135–145)
Total Bilirubin: 0.3 mg/dL (ref 0.3–1.2)
Total Protein: 6.3 g/dL (ref 6.0–8.3)

## 2014-11-05 LAB — HIV ANTIBODY (ROUTINE TESTING W REFLEX): HIV SCREEN 4TH GENERATION: NONREACTIVE

## 2014-11-05 LAB — CBC
HCT: 36.6 % (ref 36.0–46.0)
Hemoglobin: 11.2 g/dL — ABNORMAL LOW (ref 12.0–15.0)
MCH: 26.3 pg (ref 26.0–34.0)
MCHC: 30.6 g/dL (ref 30.0–36.0)
MCV: 85.9 fL (ref 78.0–100.0)
Platelets: 200 10*3/uL (ref 150–400)
RBC: 4.26 MIL/uL (ref 3.87–5.11)
RDW: 16 % — ABNORMAL HIGH (ref 11.5–15.5)
WBC: 7.2 10*3/uL (ref 4.0–10.5)

## 2014-11-05 LAB — HEPATITIS PANEL, ACUTE
HCV Ab: REACTIVE — AB
HEP B C IGM: NONREACTIVE
Hep A IgM: NONREACTIVE
Hepatitis B Surface Ag: NEGATIVE

## 2014-11-05 MED ORDER — MORPHINE SULFATE 2 MG/ML IJ SOLN: 2.0000 mg | INTRAMUSCULAR | Status: DC | PRN

## 2014-11-05 NOTE — Op Note (Signed)
[name]:  [name], Cassandra Wall           ACCOUNT NO.:  [id number]  MEDICAL RECORD NO.:  [id number]  LOCATION:  [id number]                        FACILITY:  [facility]  PHYSICIAN:  Artist PaisMatthew A. Filomeno Cromley, M.D.DATE OF BIRTH:  14-Sep-1980  DATE OF PROCEDURE:  11/04/2014 DATE OF DISCHARGE:  11/04/2014                              OPERATIVE REPORT   PREOPERATIVE DIAGNOSIS:  Right dorsal radial wrist abscess.  POSTOPERATIVE DIAGNOSIS:  Right dorsal radial wrist abscess.  PROCEDURE:  Incision and drainage above.  SURGEON:  Artist PaisMatthew A. Mina MarbleWeingold, M.D.  ASSISTANT:  None.  ANESTHESIA:  General.  COMPLICATIONS:  None.  DRAINS:  None.  CULTURES:  x2.  Stat Gram stain sent.  DESCRIPTION OF PROCEDURE:  The patient was taken to the operating suite. After the induction of adequate general anesthetic, right upper extremity was prepped and draped in sterile fashion.  An Esmarch was used to exsanguinate the limb.  Tourniquet was then inflated to 250 mmHg.  At this point in time, incision was made over the dorsoradial aspect of the right wrist and centered between the first and second dorsal compartment.  Skin was incised 5-6 cm.  Once the skin was incised, purulence was encountered.  It was cultured for aerobic, anaerobic and stat Gram stain.  We incised 5-6 cm.  Dissection was carried down.  Neurovascular structures identified and retracted.  We then irrigated with 3 L of normal saline using cystoscopy tubing.  Once we were done, we loosely packed the wound up with quarter-inch iodoform gauze and then placed in sterile dressing of 4x4s, fluffs, and a compressive wrap and Coban.  The patient tolerated the procedure well. Went to recovery room in stable fashion.     Artist PaisMatthew A. Mina MarbleWeingold, M.D.     MAW/MEDQ  D:  11/04/2014  T:  11/05/2014  Job:  [id number]

## 2014-11-05 NOTE — Plan of Care (Signed)
Problem: Phase I Progression Outcomes Goal: Pain controlled with appropriate interventions Outcome: Completed/Met Date Met:  11/05/14 Pain medications given as requested with patient resting well through shift

## 2014-11-05 NOTE — Progress Notes (Signed)
PROGRESS NOTE  Cassandra Wall AVW:[id number] DOB: 09/18/1980 DOA: 11/04/2014 PCP: Tommie Raymond, MD  Assessment/Plan: Right forearm cellulitis S/p surgery- follow up 5/3 -IV Toradol IV morphine while nothing by mouth -Empiric IV vancomycin with pharmacy dosing -Heat or ice packs for comfort locally   IV drug abuse -Admits to regular heroin usage daily and last used within 24 hours of admission -No indication to begin detoxification since her one withdrawal presents up to 1 week after cessation and we will be utilizing IV narcotics for pain management   Transaminitis due to Hep C -Hep c viral load and genotype   Normocytic anemia -Patient has known anemia - TSH ok and anemia panel since does not regularly follow with primary care physician   Code Status: full Family Communication: patient Disposition Plan:    Consultants:  ortho  Procedures:      HPI/Subjective: Hurting Says brother just died Nov 03, 2022 Objective: Filed Vitals:   11/05/14 0530  BP: 110/67  Pulse: 79  Temp: 99.4 F (37.4 C)  Resp: 18    Intake/Output Summary (Last 24 hours) at 11/05/14 1108 Last data filed at 11/04/14 1800  Gross per 24 hour  Intake    400 ml  Output    300 ml  Net    100 ml   Filed Weights   11/04/14 0724  Weight: 49.896 kg (110 lb)    Exam:   General:  tearful  Cardiovascular: rrr  Respiratory: clear  Abdomen: +BS, soft  Musculoskeletal: right arm in wrap   Data Reviewed: Basic Metabolic Panel:  Recent Labs Lab 11/04/14 0750 11/05/14 0722  NA 139 138  K 3.7 4.0  CL 105 105  CO2 28 25  GLUCOSE 129* 97  BUN 16 9  CREATININE 0.71 0.75  CALCIUM 8.9 8.6   Liver Function Tests:  Recent Labs Lab 11/04/14 0750 11/05/14 0722  AST 60* 59*  ALT 100* 85*  ALKPHOS 116 109  BILITOT 0.3 0.3  PROT 6.9 6.3  ALBUMIN 3.6 3.0*   No results for input(s): LIPASE, AMYLASE in the last 168 hours. No results for input(s): AMMONIA in the last  168 hours. CBC:  Recent Labs Lab 11/04/14 0750 11/05/14 0722  WBC 6.4 7.2  NEUTROABS 4.4  --   HGB 10.9* 11.2*  HCT 35.6* 36.6  MCV 87.0 85.9  PLT 183 200   Cardiac Enzymes: No results for input(s): CKTOTAL, CKMB, CKMBINDEX, TROPONINI in the last 168 hours. BNP (last 3 results) No results for input(s): BNP in the last 8760 hours.  ProBNP (last 3 results) No results for input(s): PROBNP in the last 8760 hours.  CBG: No results for input(s): GLUCAP in the last 168 hours.  Recent Results (from the past 240 hour(s))  Blood culture (routine x 2)     Status: None (Preliminary result)   Collection Time: 11/04/14  8:05 AM  Result Value Ref Range Status   Specimen Description BLOOD RT HAND  Final   Special Requests BOTTLES DRAWN AEROBIC AND ANAEROBIC 5CC  Final   Culture   Final           BLOOD CULTURE RECEIVED NO GROWTH TO DATE CULTURE WILL BE HELD FOR 5 DAYS BEFORE ISSUING A FINAL NEGATIVE REPORT Performed at [facility]    Report Status PENDING  Incomplete  Blood culture (routine x 2)     Status: None (Preliminary result)   Collection Time: 11/04/14  8:05 AM  Result Value Ref Range Status  Specimen Description BLOOD LTFA  Final   Special Requests BOTTLES DRAWN AEROBIC AND ANAEROBIC 5CC  Final   Culture   Final           BLOOD CULTURE RECEIVED NO GROWTH TO DATE CULTURE WILL BE HELD FOR 5 DAYS BEFORE ISSUING A FINAL NEGATIVE REPORT Performed at [facility]    Report Status PENDING  Incomplete  Surgical pcr screen     Status: Abnormal   Collection Time: 11/04/14  3:48 PM  Result Value Ref Range Status   MRSA, PCR POSITIVE (A) NEGATIVE Final   Staphylococcus aureus POSITIVE (A) NEGATIVE Final    Comment:        The Xpert SA Assay (FDA approved for NASAL specimens in patients over 33 years of age), is one component of a comprehensive surveillance program.  Test performance has been validated by [facility] for patients greater than or equal to 41  year old. It is not intended to diagnose infection nor to guide or monitor treatment.   Gram stain     Status: None   Collection Time: 11/04/14  4:38 PM  Result Value Ref Range Status   Specimen Description ABSCESS  Final   Special Requests   Final    PATIENT ON FOLLOWING VANCOMYCIN RIGHT FOREARM ABSCESS   Gram Stain   Final    ABUNDANT WBC PRESENT, PREDOMINANTLY PMN ABUNDANT GRAM POSITIVE COCCI IN PAIRS IN CLUSTERS    Report Status 11/04/2014 FINAL  Final     Studies: No results found.  Scheduled Meds: . Chlorhexidine Gluconate Cloth  6 each Topical Q0600  . docusate sodium  100 mg Oral BID  . heparin  5,000 Units Subcutaneous 3 times per day  . ketorolac  15 mg Intravenous 4 times per day  . mupirocin ointment  1 application Nasal BID  . vancomycin  1,500 mg Intravenous Q24H   Continuous Infusions:  Antibiotics Given (last 72 hours)    Date/Time Action Medication Dose Rate   11/05/14 0849 Given   vancomycin (VANCOCIN) 1,500 mg in sodium chloride 0.9 % 500 mL IVPB 1,500 mg 250 mL/hr      Principal Problem:   Right forearm cellulitis Active Problems:   IV drug abuse   Transaminitis   Normocytic anemia    Time spent: 25 min    VANN, JESSICA  [facility] Pager [phone]. If 7PM-7AM, please contact night-coverage at [email or web address], password [email or web address] 11/05/2014, 11:08 AM  LOS: 1 day

## 2014-11-06 LAB — CBC
HCT: 36.4 % (ref 36.0–46.0)
Hemoglobin: 11.1 g/dL — ABNORMAL LOW (ref 12.0–15.0)
MCH: 25.9 pg — ABNORMAL LOW (ref 26.0–34.0)
MCHC: 30.5 g/dL (ref 30.0–36.0)
MCV: 84.8 fL (ref 78.0–100.0)
Platelets: 219 10*3/uL (ref 150–400)
RBC: 4.29 MIL/uL (ref 3.87–5.11)
RDW: 15.8 % — ABNORMAL HIGH (ref 11.5–15.5)
WBC: 5.8 10*3/uL (ref 4.0–10.5)

## 2014-11-06 MED ORDER — BUSPIRONE HCL 5 MG PO TABS
5.0000 mg | ORAL_TABLET | Freq: Two times a day (BID) | ORAL | Status: DC
  Administered 2014-11-06: 5 mg via ORAL
  Filled 2014-11-06 (×2): qty 1

## 2014-11-06 MED ORDER — PERMETHRIN 5 % EX CREA
TOPICAL_CREAM | Freq: Once | CUTANEOUS | Status: AC
  Administered 2014-11-06: 12:00:00 via TOPICAL
  Filled 2014-11-06 (×2): qty 60

## 2014-11-06 MED ORDER — PERMETHRIN 0.25 % LIQD
Freq: Once | Status: AC
  Administered 2014-11-06: 12:00:00 via TOPICAL
  Filled 2014-11-06: qty 147.86

## 2014-11-06 NOTE — Progress Notes (Addendum)
PROGRESS NOTE  Cassandra Wall ZOX:[id number] DOB: 1981/01/28 DOA: 11/04/2014 PCP: Tommie Raymond, MD  Assessment/Plan: Right forearm cellulitis S/p surgery- follow up 5/3 -IV Toradol IV morphine while nothing by mouth -Empiric IV vancomycin with pharmacy dosing -Heat or ice packs for comfort locally   IV drug abuse -Admits to regular heroin usage daily and last used within 24 hours of admission   Transaminitis due to Hep C -Hep c viral load and genotype   Normocytic anemia -Patient has known anemia - TSH ok and anemia panel since does not regularly follow with primary care physician  Social work consult  Code Status: full Family Communication: patient/called mother with patient's permission Disposition Plan:    Consultants:  ortho  Procedures:      HPI/Subjective: C/o anxiety Asking for xanax  Objective: Filed Vitals:   11/06/14 0637  BP: 103/60  Pulse: 79  Temp: 98.4 F (36.9 C)  Resp: 18    Intake/Output Summary (Last 24 hours) at 11/06/14 1041 Last data filed at 11/06/14 1000  Gross per 24 hour  Intake   1530 ml  Output   1320 ml  Net    210 ml   Filed Weights   11/04/14 0724  Weight: 49.896 kg (110 lb)    Exam:   General:  Awake, NAD  Musculoskeletal: right arm in wrap   Data Reviewed: Basic Metabolic Panel:  Recent Labs Lab 11/04/14 0750 11/05/14 0722  NA 139 138  K 3.7 4.0  CL 105 105  CO2 28 25  GLUCOSE 129* 97  BUN 16 9  CREATININE 0.71 0.75  CALCIUM 8.9 8.6   Liver Function Tests:  Recent Labs Lab 11/04/14 0750 11/05/14 0722  AST 60* 59*  ALT 100* 85*  ALKPHOS 116 109  BILITOT 0.3 0.3  PROT 6.9 6.3  ALBUMIN 3.6 3.0*   No results for input(s): LIPASE, AMYLASE in the last 168 hours. No results for input(s): AMMONIA in the last 168 hours. CBC:  Recent Labs Lab 11/04/14 0750 11/05/14 0722 11/06/14 0609  WBC 6.4 7.2 5.8  NEUTROABS 4.4  --   --   HGB 10.9* 11.2* 11.1*  HCT 35.6* 36.6 36.4  MCV  87.0 85.9 84.8  PLT 183 200 219   Cardiac Enzymes: No results for input(s): CKTOTAL, CKMB, CKMBINDEX, TROPONINI in the last 168 hours. BNP (last 3 results) No results for input(s): BNP in the last 8760 hours.  ProBNP (last 3 results) No results for input(s): PROBNP in the last 8760 hours.  CBG: No results for input(s): GLUCAP in the last 168 hours.  Recent Results (from the past 240 hour(s))  Blood culture (routine x 2)     Status: None (Preliminary result)   Collection Time: 11/04/14  8:05 AM  Result Value Ref Range Status   Specimen Description BLOOD RT HAND  Final   Special Requests BOTTLES DRAWN AEROBIC AND ANAEROBIC 5CC  Final   Culture   Final           BLOOD CULTURE RECEIVED NO GROWTH TO DATE CULTURE WILL BE HELD FOR 5 DAYS BEFORE ISSUING A FINAL NEGATIVE REPORT Performed at [facility]    Report Status PENDING  Incomplete  Blood culture (routine x 2)     Status: None (Preliminary result)   Collection Time: 11/04/14  8:05 AM  Result Value Ref Range Status   Specimen Description BLOOD LTFA  Final   Special Requests BOTTLES DRAWN AEROBIC AND ANAEROBIC 5CC  Final   Culture  Final           BLOOD CULTURE RECEIVED NO GROWTH TO DATE CULTURE WILL BE HELD FOR 5 DAYS BEFORE ISSUING A FINAL NEGATIVE REPORT Performed at [facility]    Report Status PENDING  Incomplete  Surgical pcr screen     Status: Abnormal   Collection Time: 11/04/14  3:48 PM  Result Value Ref Range Status   MRSA, PCR POSITIVE (A) NEGATIVE Final   Staphylococcus aureus POSITIVE (A) NEGATIVE Final    Comment:        The Xpert SA Assay (FDA approved for NASAL specimens in patients over 33 years of age), is one component of a comprehensive surveillance program.  Test performance has been validated by [facility] for patients greater than or equal to 33 year old. It is not intended to diagnose infection nor to guide or monitor treatment.   Anaerobic culture     Status: None  (Preliminary result)   Collection Time: 11/04/14  4:38 PM  Result Value Ref Range Status   Specimen Description ABSCESS  Final   Special Requests PATIENT ON FOLLOWING RIGHT FOREARM ABSCESS  Final   Gram Stain   Final    ABUNDANT WBC PRESENT,BOTH PMN AND MONONUCLEAR NO SQUAMOUS EPITHELIAL CELLS SEEN FEW GRAM POSITIVE COCCI IN PAIRS IN CLUSTERS Performed at [facility]    Culture   Final    NO ANAEROBES ISOLATED; CULTURE IN PROGRESS FOR 5 DAYS Performed at [facility]    Report Status PENDING  Incomplete  Gram stain     Status: None   Collection Time: 11/04/14  4:38 PM  Result Value Ref Range Status   Specimen Description ABSCESS  Final   Special Requests   Final    PATIENT ON FOLLOWING VANCOMYCIN RIGHT FOREARM ABSCESS   Gram Stain   Final    ABUNDANT WBC PRESENT, PREDOMINANTLY PMN ABUNDANT GRAM POSITIVE COCCI IN PAIRS IN CLUSTERS    Report Status 11/04/2014 FINAL  Final  Culture, routine-abscess     Status: None (Preliminary result)   Collection Time: 11/04/14  4:38 PM  Result Value Ref Range Status   Specimen Description ABSCESS  Final   Special Requests   Final    PATIENT ON FOLLOWING VANCOMYCIN RIGHT FOREARM ABSCESS   Gram Stain   Final    ABUNDANT WBC PRESENT,BOTH PMN AND MONONUCLEAR NO SQUAMOUS EPITHELIAL CELLS SEEN FEW GRAM POSITIVE COCCI IN PAIRS IN CLUSTERS Performed at [facility]    Culture   Final    ABUNDANT STAPHYLOCOCCUS AUREUS Note: RIFAMPIN AND GENTAMICIN SHOULD NOT BE USED AS SINGLE DRUGS FOR TREATMENT OF STAPH INFECTIONS. Performed at [facility]    Report Status PENDING  Incomplete     Studies: No results found.  Scheduled Meds: . busPIRone  5 mg Oral BID  . Chlorhexidine Gluconate Cloth  6 each Topical Q0600  . docusate sodium  100 mg Oral BID  . heparin  5,000 Units Subcutaneous 3 times per day  . ketorolac  15 mg Intravenous 4 times per day  . mupirocin ointment  1 application Nasal BID  . vancomycin   1,500 mg Intravenous Q24H   Continuous Infusions:  Antibiotics Given (last 72 hours)    Date/Time Action Medication Dose Rate   11/05/14 0849 Given   vancomycin (VANCOCIN) 1,500 mg in sodium chloride 0.9 % 500 mL IVPB 1,500 mg 250 mL/hr   11/06/14 0802 Given   vancomycin (VANCOCIN) 1,500 mg in sodium chloride 0.9 %  500 mL IVPB 1,500 mg 250 mL/hr      Principal Problem:   Right forearm cellulitis Active Problems:   IV drug abuse   Transaminitis   Normocytic anemia    Time spent: 25 min    Cassandra Wall  [facility] Pager [phone]. If 7PM-7AM, please contact night-coverage at [email or web address], password [facility] 11/06/2014, 10:41 AM  LOS: 2 days

## 2014-11-06 NOTE — Clinical Social Work Note (Signed)
CSW has received consults for patient. CSW unable to assess Friday. Hand off report left with weekend CSW.  Roddie McBryant Sharmel Ballantine MSW, [place]CSWA, [place]AS[place], [phone]

## 2014-11-06 NOTE — Progress Notes (Signed)
Pt was found walking on hallway and stated " I want to go home. I can't do this anymore. I have my personal driver, so I don't need to worry about my ride" . Pt was explained about risks associated with leaving with failed multiple attempts. Pt 's mother had  just left the room 10 minute ago. Pt's doesn't want to let her mom about her leaving. Pt's signed AMA form. PIV was removed prior to discharge. On-call provider Benedetto Coons Callahan, NP was made aware.

## 2014-11-07 NOTE — Progress Notes (Signed)
Patient called wanting information on how to care for her hand after leaving AMA last night. Informed patient that unfortunately at this time the only option for her is to seek the information from her PCP or come back to the ED. Patient requested to have her mom removed from her chart for giving information information.

## 2014-11-08 LAB — HCV RNA QUANT
HCV QUANT LOG: 4.98 {Log} — AB (ref <1.18)
HCV QUANT: 115011 [IU]/mL — AB (ref <15)
HCV Quantitative Log: 5.06 {Log} — ABNORMAL HIGH (ref <1.18)
HCV Quantitative: 96469 IU/mL — ABNORMAL HIGH (ref <15)

## 2014-11-09 LAB — CULTURE, ROUTINE-ABSCESS

## 2014-11-09 LAB — ANAEROBIC CULTURE

## 2014-11-10 LAB — CULTURE, BLOOD (ROUTINE X 2)
Culture: NO GROWTH
Culture: NO GROWTH

## 2014-11-14 NOTE — Discharge Summary (Signed)
Physician Discharge Summary  Marygrace DroughtSamantha Brensinger MWN:[id number] DOB: 12-16-1980 DOA: 11/04/2014  PCP: Tommie RaymondWilson, Amelia P, MD  Admit date: 11/04/2014 Discharge date: 11/14/2014    PATIENT LEFT AMA   Time spent: 35 minutes  Recommendations for Outpatient Follow-up:  1. Left AMA  Discharge Diagnoses:  Principal Problem:   Right forearm cellulitis Active Problems:   IV drug abuse   Transaminitis   Normocytic anemia    Filed Weights   11/04/14 0724  Weight: 49.896 kg (110 lb)    History of present illness:  This is a 34 year old female patient with past medical history of chronic ongoing IV drug abuse primarily with heroin, seizure disorder, migraines, kidney stones, Endometriosis, anxiety disorder, anemia, and recurrent urinary tract infections. This patient was initially seen on 4/27 at the [facility] emergency room department complaints of redness and swelling in right forearm. Admission was advised at that time due to concerns for underlying abscess. Patient left AMA several hours later on 4/28 at 2:48 AM stating she had issues regarding her brother's recent death and had family staying in her home and was unable to remain in the emergency department. He turned back to the emergency department on 4/28 at 8 in the morning complaining of continued pain and swelling. Admission was readvised and patient agreed to be admitted to the hospital. The emergency room physician had previously discussed the patient's case with Dr. Mina MarbleWeingold with orthopedics will evaluate the patient for possible surgical incision and drainage. Laboratory data from 4/28 was unremarkable except for mild hyperglycemia glucose 129, anemia hemoglobin 10.9 with a normal platelet count. Her cultures have been obtained and results are pending. Patient has received IV vancomycin in the emergency department.  Hospital Course:  Right forearm cellulitis S/p surgery- follow up 5/3 -IV Toradol IV morphine while nothing by  mouth -Empiric IV vancomycin with pharmacy dosing -Heat or ice packs for comfort locally -left AMA    IV drug abuse -Admits to regular heroin usage daily and last used within 24 hours of admission   Transaminitis due to Hep C -Hep c viral load and genotype   Normocytic anemia -Patient has known anemia - TSH ok and anemia panel since does not regularly follow with primary care physician  Social work consult  Procedures:  i&D  Consultations:  Hand surgery  Discharge Exam: Filed Vitals:   11/06/14 1512  BP: 124/77  Pulse: 61  Temp: 99.4 F (37.4 C)  Resp: 20      Discharge Instructions    Discharge Medication List as of 11/06/2014  7:42 PM    CONTINUE these medications which have NOT CHANGED   Details  DM-Doxylamine-Acetaminophen (NYQUIL COLD & FLU PO) Take 30 mLs by mouth at bedtime as needed (cold/flu)., Until Discontinued, Historical Med    ibuprofen (ADVIL,MOTRIN) 200 MG tablet Take 800 mg by mouth every 6 (six) hours as needed for fever or mild pain. , Until Discontinued, Historical Med       Allergies  Allergen Reactions  . Wheat Anaphylaxis  . Ciprofloxacin Hcl     Reports as resistant   . Latex Hives, Itching and Rash      The results of significant diagnostics from this hospitalization (including imaging, microbiology, ancillary and laboratory) are listed below for reference.    Significant Diagnostic Studies: No results found.  Microbiology: No results found for this or any previous visit (from the past 240 hour(s)).   Labs: Basic Metabolic Panel: No results for input(s): NA, K, CL, CO2, GLUCOSE,  BUN, CREATININE, CALCIUM, MG, PHOS in the last 168 hours. Liver Function Tests: No results for input(s): AST, ALT, ALKPHOS, BILITOT, PROT, ALBUMIN in the last 168 hours. No results for input(s): LIPASE, AMYLASE in the last 168 hours. No results for input(s): AMMONIA in the last 168 hours. CBC: No results for input(s): WBC, NEUTROABS, HGB,  HCT, MCV, PLT in the last 168 hours. Cardiac Enzymes: No results for input(s): CKTOTAL, CKMB, CKMBINDEX, TROPONINI in the last 168 hours. BNP: BNP (last 3 results) No results for input(s): BNP in the last 8760 hours.  ProBNP (last 3 results) No results for input(s): PROBNP in the last 8760 hours.  CBG: No results for input(s): GLUCAP in the last 168 hours.     SignedMarlin Canary:  Chloie Loney  [facility] 11/14/2014, 4:43 PM

## 2015-04-07 ENCOUNTER — Emergency Department (HOSPITAL_COMMUNITY)
Admission: EM | Admit: 2015-04-07 | Discharge: 2015-04-07 | Disposition: A | Discharge status: Home / Self Care | Payer: Medicaid Other

## 2015-04-07 NOTE — ED Notes (Signed)
No answer x1

## 2015-04-07 NOTE — ED Notes (Signed)
Pt did not answer x 2 

## 2015-08-02 ENCOUNTER — Emergency Department (HOSPITAL_COMMUNITY)
Admission: EM | Admit: 2015-08-02 | Discharge: 2015-08-02 | Disposition: A | Discharge status: Home / Self Care | Payer: Medicaid Other

## 2015-08-02 NOTE — ED Notes (Signed)
Called for one touch x 2, no answer.

## 2015-10-04 ENCOUNTER — Emergency Department (HOSPITAL_COMMUNITY): Payer: Medicaid Other

## 2015-10-04 ENCOUNTER — Inpatient Hospital Stay (HOSPITAL_COMMUNITY)
Admission: EM | Admit: 2015-10-04 | Discharge: 2015-10-26 | DRG: 004 | Disposition: A | Discharge status: Other Acute Inpatient Hospital | Payer: Medicaid Other | Attending: Internal Medicine | Admitting: Internal Medicine

## 2015-10-04 ENCOUNTER — Inpatient Hospital Stay (HOSPITAL_COMMUNITY): Payer: Medicaid Other

## 2015-10-04 ENCOUNTER — Encounter (HOSPITAL_COMMUNITY): Payer: Self-pay | Admitting: Emergency Medicine

## 2015-10-04 DIAGNOSIS — F419 Anxiety disorder, unspecified: Secondary | ICD-10-CM | POA: Diagnosis present

## 2015-10-04 DIAGNOSIS — R471 Dysarthria and anarthria: Secondary | ICD-10-CM | POA: Diagnosis present

## 2015-10-04 DIAGNOSIS — L03113 Cellulitis of right upper limb: Secondary | ICD-10-CM | POA: Diagnosis present

## 2015-10-04 DIAGNOSIS — I639 Cerebral infarction, unspecified: Secondary | ICD-10-CM | POA: Diagnosis not present

## 2015-10-04 DIAGNOSIS — B009 Herpesviral infection, unspecified: Secondary | ICD-10-CM | POA: Diagnosis present

## 2015-10-04 DIAGNOSIS — R6521 Severe sepsis with septic shock: Secondary | ICD-10-CM | POA: Diagnosis present

## 2015-10-04 DIAGNOSIS — R9431 Abnormal electrocardiogram [ECG] [EKG]: Secondary | ICD-10-CM | POA: Diagnosis present

## 2015-10-04 DIAGNOSIS — I4581 Long QT syndrome: Secondary | ICD-10-CM | POA: Diagnosis not present

## 2015-10-04 DIAGNOSIS — I361 Nonrheumatic tricuspid (valve) insufficiency: Secondary | ICD-10-CM | POA: Diagnosis not present

## 2015-10-04 DIAGNOSIS — I76 Septic arterial embolism: Secondary | ICD-10-CM | POA: Diagnosis present

## 2015-10-04 DIAGNOSIS — R7401 Elevation of levels of liver transaminase levels: Secondary | ICD-10-CM

## 2015-10-04 DIAGNOSIS — J189 Pneumonia, unspecified organism: Secondary | ICD-10-CM

## 2015-10-04 DIAGNOSIS — L03119 Cellulitis of unspecified part of limb: Secondary | ICD-10-CM

## 2015-10-04 DIAGNOSIS — R680 Hypothermia, not associated with low environmental temperature: Secondary | ICD-10-CM | POA: Diagnosis present

## 2015-10-04 DIAGNOSIS — D649 Anemia, unspecified: Secondary | ICD-10-CM

## 2015-10-04 DIAGNOSIS — N3020 Other chronic cystitis without hematuria: Secondary | ICD-10-CM | POA: Diagnosis present

## 2015-10-04 DIAGNOSIS — G8194 Hemiplegia, unspecified affecting left nondominant side: Secondary | ICD-10-CM | POA: Diagnosis present

## 2015-10-04 DIAGNOSIS — I6789 Other cerebrovascular disease: Secondary | ICD-10-CM

## 2015-10-04 DIAGNOSIS — Y95 Nosocomial condition: Secondary | ICD-10-CM | POA: Diagnosis present

## 2015-10-04 DIAGNOSIS — I339 Acute and subacute endocarditis, unspecified: Secondary | ICD-10-CM | POA: Diagnosis not present

## 2015-10-04 DIAGNOSIS — I330 Acute and subacute infective endocarditis: Secondary | ICD-10-CM | POA: Diagnosis present

## 2015-10-04 DIAGNOSIS — G9341 Metabolic encephalopathy: Secondary | ICD-10-CM | POA: Diagnosis present

## 2015-10-04 DIAGNOSIS — I10 Essential (primary) hypertension: Secondary | ICD-10-CM | POA: Diagnosis present

## 2015-10-04 DIAGNOSIS — I351 Nonrheumatic aortic (valve) insufficiency: Secondary | ICD-10-CM | POA: Diagnosis not present

## 2015-10-04 DIAGNOSIS — J9811 Atelectasis: Secondary | ICD-10-CM | POA: Diagnosis not present

## 2015-10-04 DIAGNOSIS — K921 Melena: Secondary | ICD-10-CM | POA: Diagnosis not present

## 2015-10-04 DIAGNOSIS — Z8701 Personal history of pneumonia (recurrent): Secondary | ICD-10-CM | POA: Diagnosis present

## 2015-10-04 DIAGNOSIS — B373 Candidiasis of vulva and vagina: Secondary | ICD-10-CM | POA: Diagnosis present

## 2015-10-04 DIAGNOSIS — E873 Alkalosis: Secondary | ICD-10-CM | POA: Diagnosis present

## 2015-10-04 DIAGNOSIS — L02611 Cutaneous abscess of right foot: Secondary | ICD-10-CM | POA: Diagnosis present

## 2015-10-04 DIAGNOSIS — R740 Nonspecific elevation of levels of transaminase and lactic acid dehydrogenase [LDH]: Secondary | ICD-10-CM

## 2015-10-04 DIAGNOSIS — J1569 Pneumonia due to other gram-negative bacteria: Secondary | ICD-10-CM

## 2015-10-04 DIAGNOSIS — E876 Hypokalemia: Secondary | ICD-10-CM | POA: Diagnosis present

## 2015-10-04 DIAGNOSIS — I63411 Cerebral infarction due to embolism of right middle cerebral artery: Secondary | ICD-10-CM | POA: Diagnosis present

## 2015-10-04 DIAGNOSIS — F22 Delusional disorders: Secondary | ICD-10-CM

## 2015-10-04 DIAGNOSIS — I38 Endocarditis, valve unspecified: Secondary | ICD-10-CM

## 2015-10-04 DIAGNOSIS — Z4659 Encounter for fitting and adjustment of other gastrointestinal appliance and device: Secondary | ICD-10-CM

## 2015-10-04 DIAGNOSIS — Z430 Encounter for attention to tracheostomy: Secondary | ICD-10-CM | POA: Diagnosis not present

## 2015-10-04 DIAGNOSIS — G060 Intracranial abscess and granuloma: Secondary | ICD-10-CM | POA: Diagnosis not present

## 2015-10-04 DIAGNOSIS — E162 Hypoglycemia, unspecified: Secondary | ICD-10-CM | POA: Diagnosis not present

## 2015-10-04 DIAGNOSIS — F1123 Opioid dependence with withdrawal: Secondary | ICD-10-CM | POA: Diagnosis not present

## 2015-10-04 DIAGNOSIS — J9690 Respiratory failure, unspecified, unspecified whether with hypoxia or hypercapnia: Secondary | ICD-10-CM

## 2015-10-04 DIAGNOSIS — Z881 Allergy status to other antibiotic agents status: Secondary | ICD-10-CM | POA: Diagnosis not present

## 2015-10-04 DIAGNOSIS — R29810 Facial weakness: Secondary | ICD-10-CM | POA: Diagnosis present

## 2015-10-04 DIAGNOSIS — Z9911 Dependence on respirator [ventilator] status: Secondary | ICD-10-CM | POA: Diagnosis not present

## 2015-10-04 DIAGNOSIS — R4182 Altered mental status, unspecified: Secondary | ICD-10-CM | POA: Diagnosis not present

## 2015-10-04 DIAGNOSIS — B182 Chronic viral hepatitis C: Secondary | ICD-10-CM | POA: Diagnosis present

## 2015-10-04 DIAGNOSIS — Z978 Presence of other specified devices: Secondary | ICD-10-CM

## 2015-10-04 DIAGNOSIS — A4101 Sepsis due to Methicillin susceptible Staphylococcus aureus: Secondary | ICD-10-CM | POA: Diagnosis present

## 2015-10-04 DIAGNOSIS — N170 Acute kidney failure with tubular necrosis: Secondary | ICD-10-CM | POA: Diagnosis not present

## 2015-10-04 DIAGNOSIS — F17210 Nicotine dependence, cigarettes, uncomplicated: Secondary | ICD-10-CM | POA: Diagnosis present

## 2015-10-04 DIAGNOSIS — Z0189 Encounter for other specified special examinations: Secondary | ICD-10-CM

## 2015-10-04 DIAGNOSIS — J90 Pleural effusion, not elsewhere classified: Secondary | ICD-10-CM | POA: Diagnosis not present

## 2015-10-04 DIAGNOSIS — R451 Restlessness and agitation: Secondary | ICD-10-CM | POA: Diagnosis not present

## 2015-10-04 DIAGNOSIS — J156 Pneumonia due to other aerobic Gram-negative bacteria: Secondary | ICD-10-CM | POA: Diagnosis not present

## 2015-10-04 DIAGNOSIS — J918 Pleural effusion in other conditions classified elsewhere: Secondary | ICD-10-CM | POA: Diagnosis not present

## 2015-10-04 DIAGNOSIS — L02419 Cutaneous abscess of limb, unspecified: Secondary | ICD-10-CM

## 2015-10-04 DIAGNOSIS — R34 Anuria and oliguria: Secondary | ICD-10-CM | POA: Diagnosis present

## 2015-10-04 DIAGNOSIS — L02415 Cutaneous abscess of right lower limb: Secondary | ICD-10-CM | POA: Diagnosis present

## 2015-10-04 DIAGNOSIS — N179 Acute kidney failure, unspecified: Secondary | ICD-10-CM | POA: Diagnosis present

## 2015-10-04 DIAGNOSIS — I2690 Septic pulmonary embolism without acute cor pulmonale: Secondary | ICD-10-CM | POA: Diagnosis present

## 2015-10-04 DIAGNOSIS — B3749 Other urogenital candidiasis: Secondary | ICD-10-CM | POA: Diagnosis present

## 2015-10-04 DIAGNOSIS — IMO0002 Reserved for concepts with insufficient information to code with codable children: Secondary | ICD-10-CM

## 2015-10-04 DIAGNOSIS — J9600 Acute respiratory failure, unspecified whether with hypoxia or hypercapnia: Secondary | ICD-10-CM

## 2015-10-04 DIAGNOSIS — B9561 Methicillin susceptible Staphylococcus aureus infection as the cause of diseases classified elsewhere: Secondary | ICD-10-CM | POA: Diagnosis not present

## 2015-10-04 DIAGNOSIS — A419 Sepsis, unspecified organism: Secondary | ICD-10-CM | POA: Diagnosis not present

## 2015-10-04 DIAGNOSIS — R079 Chest pain, unspecified: Secondary | ICD-10-CM | POA: Diagnosis not present

## 2015-10-04 DIAGNOSIS — J9601 Acute respiratory failure with hypoxia: Secondary | ICD-10-CM | POA: Diagnosis not present

## 2015-10-04 DIAGNOSIS — R400 Somnolence: Secondary | ICD-10-CM | POA: Diagnosis not present

## 2015-10-04 DIAGNOSIS — Z91040 Latex allergy status: Secondary | ICD-10-CM

## 2015-10-04 DIAGNOSIS — D696 Thrombocytopenia, unspecified: Secondary | ICD-10-CM | POA: Diagnosis present

## 2015-10-04 DIAGNOSIS — I160 Hypertensive urgency: Secondary | ICD-10-CM | POA: Diagnosis not present

## 2015-10-04 DIAGNOSIS — R0602 Shortness of breath: Secondary | ICD-10-CM

## 2015-10-04 DIAGNOSIS — I6389 Other cerebral infarction: Secondary | ICD-10-CM

## 2015-10-04 DIAGNOSIS — I748 Embolism and thrombosis of other arteries: Secondary | ICD-10-CM | POA: Diagnosis not present

## 2015-10-04 DIAGNOSIS — L8990 Pressure ulcer of unspecified site, unspecified stage: Secondary | ICD-10-CM

## 2015-10-04 DIAGNOSIS — E878 Other disorders of electrolyte and fluid balance, not elsewhere classified: Secondary | ICD-10-CM | POA: Diagnosis present

## 2015-10-04 DIAGNOSIS — Z91018 Allergy to other foods: Secondary | ICD-10-CM | POA: Diagnosis not present

## 2015-10-04 DIAGNOSIS — E872 Acidosis: Secondary | ICD-10-CM | POA: Diagnosis present

## 2015-10-04 DIAGNOSIS — F19939 Other psychoactive substance use, unspecified with withdrawal, unspecified: Secondary | ICD-10-CM | POA: Diagnosis not present

## 2015-10-04 DIAGNOSIS — F1910 Other psychoactive substance abuse, uncomplicated: Secondary | ICD-10-CM

## 2015-10-04 DIAGNOSIS — I6340 Cerebral infarction due to embolism of unspecified cerebral artery: Secondary | ICD-10-CM | POA: Diagnosis not present

## 2015-10-04 DIAGNOSIS — Z452 Encounter for adjustment and management of vascular access device: Secondary | ICD-10-CM | POA: Insufficient documentation

## 2015-10-04 LAB — I-STAT CHEM 8, ED
BUN: 52 mg/dL — AB (ref 6–20)
CALCIUM ION: 0.92 mmol/L — AB (ref 1.12–1.23)
Chloride: 99 mmol/L — ABNORMAL LOW (ref 101–111)
Creatinine, Ser: 4.5 mg/dL — ABNORMAL HIGH (ref 0.44–1.00)
Glucose, Bld: 91 mg/dL (ref 65–99)
HEMATOCRIT: 29 % — AB (ref 36.0–46.0)
Hemoglobin: 9.9 g/dL — ABNORMAL LOW (ref 12.0–15.0)
Potassium: 3.6 mmol/L (ref 3.5–5.1)
Sodium: 131 mmol/L — ABNORMAL LOW (ref 135–145)
TCO2: 18 mmol/L (ref 0–100)

## 2015-10-04 LAB — COMPREHENSIVE METABOLIC PANEL
ALBUMIN: 2.4 g/dL — AB (ref 3.5–5.0)
ALBUMIN: 2.1 g/dL — AB (ref 3.5–5.0)
ALK PHOS: 128 U/L — AB (ref 38–126)
ALT: 39 U/L (ref 14–54)
ALT: 37 U/L (ref 14–54)
AST: 66 U/L — AB (ref 15–41)
AST: 62 U/L — AB (ref 15–41)
Alkaline Phosphatase: 152 U/L — ABNORMAL HIGH (ref 38–126)
Anion gap: 18 — ABNORMAL HIGH (ref 5–15)
Anion gap: 12 (ref 5–15)
BUN: 59 mg/dL — AB (ref 6–20)
BUN: 60 mg/dL — AB (ref 6–20)
CALCIUM: 7 mg/dL — AB (ref 8.9–10.3)
CHLORIDE: 106 mmol/L (ref 101–111)
CO2: 17 mmol/L — ABNORMAL LOW (ref 22–32)
CO2: 16 mmol/L — AB (ref 22–32)
CREATININE: 4.48 mg/dL — AB (ref 0.44–1.00)
CREATININE: 3.88 mg/dL — AB (ref 0.44–1.00)
Calcium: 7.4 mg/dL — ABNORMAL LOW (ref 8.9–10.3)
Chloride: 98 mmol/L — ABNORMAL LOW (ref 101–111)
GFR calc Af Amer: 14 mL/min — ABNORMAL LOW (ref >60)
GFR calc non Af Amer: 14 mL/min — ABNORMAL LOW (ref >60)
GFR, EST AFRICAN AMERICAN: 16 mL/min — AB (ref >60)
GFR, EST NON AFRICAN AMERICAN: 12 mL/min — AB (ref >60)
GLUCOSE: 98 mg/dL (ref 65–99)
GLUCOSE: 137 mg/dL — AB (ref 65–99)
POTASSIUM: 3.7 mmol/L (ref 3.5–5.1)
Potassium: 4 mmol/L (ref 3.5–5.1)
SODIUM: 134 mmol/L — AB (ref 135–145)
Sodium: 133 mmol/L — ABNORMAL LOW (ref 135–145)
Total Bilirubin: 0.8 mg/dL (ref 0.3–1.2)
Total Bilirubin: 1.2 mg/dL (ref 0.3–1.2)
Total Protein: 6.5 g/dL (ref 6.5–8.1)
Total Protein: 5.9 g/dL — ABNORMAL LOW (ref 6.5–8.1)

## 2015-10-04 LAB — PROTIME-INR
INR: 1.31 (ref 0.00–1.49)
Prothrombin Time: 15.9 seconds — ABNORMAL HIGH (ref 11.6–15.2)

## 2015-10-04 LAB — ECHOCARDIOGRAM COMPLETE
Height: 68 in
Weight: 1920 oz

## 2015-10-04 LAB — I-STAT BETA HCG BLOOD, ED (MC, WL, AP ONLY): I-stat hCG, quantitative: <5 m[IU]/mL (ref <5)

## 2015-10-04 LAB — CBC
HCT: 26.6 % — ABNORMAL LOW (ref 36.0–46.0)
HEMATOCRIT: 25.6 % — AB (ref 36.0–46.0)
HEMOGLOBIN: 8.3 g/dL — AB (ref 12.0–15.0)
Hemoglobin: 8.6 g/dL — ABNORMAL LOW (ref 12.0–15.0)
MCH: 25.7 pg — AB (ref 26.0–34.0)
MCH: 26.4 pg (ref 26.0–34.0)
MCHC: 32.3 g/dL (ref 30.0–36.0)
MCHC: 32.4 g/dL (ref 30.0–36.0)
MCV: 79.4 fL (ref 78.0–100.0)
MCV: 81.5 fL (ref 78.0–100.0)
Platelets: 86 10*3/uL — ABNORMAL LOW (ref 150–400)
Platelets: 69 10*3/uL — ABNORMAL LOW (ref 150–400)
RBC: 3.35 MIL/uL — ABNORMAL LOW (ref 3.87–5.11)
RBC: 3.14 MIL/uL — ABNORMAL LOW (ref 3.87–5.11)
RDW: 15.1 % (ref 11.5–15.5)
RDW: 15.3 % (ref 11.5–15.5)
WBC: 11.8 10*3/uL — ABNORMAL HIGH (ref 4.0–10.5)
WBC: 8.8 10*3/uL (ref 4.0–10.5)

## 2015-10-04 LAB — GLUCOSE, CAPILLARY: GLUCOSE-CAPILLARY: 99 mg/dL (ref 65–99)

## 2015-10-04 LAB — URINALYSIS, ROUTINE W REFLEX MICROSCOPIC
Glucose, UA: NEGATIVE mg/dL
Ketones, ur: NEGATIVE mg/dL
Nitrite: POSITIVE — AB
Protein, ur: 100 mg/dL — AB
Specific Gravity, Urine: 1.023 (ref 1.005–1.030)
pH: 5 (ref 5.0–8.0)

## 2015-10-04 LAB — DIC (DISSEMINATED INTRAVASCULAR COAGULATION) PANEL
APTT: 35 s (ref 24–37)
FIBRINOGEN: 466 mg/dL (ref 204–475)
INR: 1.39 (ref 0.00–1.49)
PROTHROMBIN TIME: 17.2 s — AB (ref 11.6–15.2)

## 2015-10-04 LAB — CSF CELL COUNT WITH DIFFERENTIAL
EOS CSF: 0 % (ref 0–1)
LYMPHS CSF: 5 % — AB (ref 40–80)
Monocyte-Macrophage-Spinal Fluid: 24 % (ref 15–45)
RBC COUNT CSF: 3515 /mm3 — AB
RBC COUNT CSF: 585 /mm3 — AB
Segmented Neutrophils-CSF: 71 % — ABNORMAL HIGH (ref 0–6)
TUBE #: 3
Tube #: 4
WBC CSF: 11 /mm3 — AB (ref 0–5)
WBC CSF: 4 /mm3 (ref 0–5)

## 2015-10-04 LAB — CRYPTOCOCCAL ANTIGEN, CSF: Crypto Ag: NEGATIVE

## 2015-10-04 LAB — GLUCOSE, CSF: GLUCOSE CSF: 49 mg/dL (ref 40–70)

## 2015-10-04 LAB — RAPID URINE DRUG SCREEN, HOSP PERFORMED
Amphetamines: NOT DETECTED
BARBITURATES: NOT DETECTED
BENZODIAZEPINES: NOT DETECTED
COCAINE: POSITIVE — AB
Opiates: POSITIVE — AB
TETRAHYDROCANNABINOL: NOT DETECTED

## 2015-10-04 LAB — DIFFERENTIAL
BASOS PCT: 0 %
Basophils Absolute: 0 10*3/uL (ref 0.0–0.1)
EOS ABS: 0 10*3/uL (ref 0.0–0.7)
Eosinophils Relative: 0 %
Lymphocytes Relative: 7 %
Lymphs Abs: 0.8 10*3/uL (ref 0.7–4.0)
MONO ABS: 0.8 10*3/uL (ref 0.1–1.0)
Monocytes Relative: 7 %
NEUTROS ABS: 10.2 10*3/uL — AB (ref 1.7–7.7)
Neutrophils Relative %: 86 %

## 2015-10-04 LAB — URINE MICROSCOPIC-ADD ON

## 2015-10-04 LAB — I-STAT CG4 LACTIC ACID, ED: LACTIC ACID, VENOUS: 3.11 mmol/L — AB (ref 0.5–2.0)

## 2015-10-04 LAB — PROCALCITONIN: Procalcitonin: 21.66 ng/mL

## 2015-10-04 LAB — APTT: aPTT: 31 seconds (ref 24–37)

## 2015-10-04 LAB — I-STAT TROPONIN, ED: TROPONIN I, POC: 0.04 ng/mL (ref 0.00–0.08)

## 2015-10-04 LAB — PATHOLOGIST SMEAR REVIEW

## 2015-10-04 LAB — DIC (DISSEMINATED INTRAVASCULAR COAGULATION)PANEL
D-Dimer, Quant: >20 ug/mL-FEU — ABNORMAL HIGH (ref 0.00–0.50)
Platelets: 74 10*3/uL — ABNORMAL LOW (ref 150–400)
Smear Review: NONE SEEN

## 2015-10-04 LAB — PROTEIN, CSF: TOTAL PROTEIN, CSF: 35 mg/dL (ref 15–45)

## 2015-10-04 LAB — ETHANOL

## 2015-10-04 LAB — LACTIC ACID, PLASMA: Lactic Acid, Venous: 0.9 mmol/L (ref 0.5–2.0)

## 2015-10-04 MED ORDER — ENOXAPARIN SODIUM 40 MG/0.4ML ~~LOC~~ SOLN
40.0000 mg | SUBCUTANEOUS | Status: DC
  Filled 2015-10-04: qty 0.4

## 2015-10-04 MED ORDER — ONDANSETRON HCL 4 MG PO TABS: 4.0000 mg | ORAL_TABLET | Freq: Four times a day (QID) | ORAL | Status: DC | PRN

## 2015-10-04 MED ORDER — STROKE: EARLY STAGES OF RECOVERY BOOK
Freq: Once | Status: AC
  Administered 2015-10-04: 11:00:00
  Filled 2015-10-04: qty 1

## 2015-10-04 MED ORDER — SODIUM CHLORIDE 0.9 % IV BOLUS (SEPSIS)
500.0000 mL | Freq: Once | INTRAVENOUS | Status: AC
  Administered 2015-10-04: 500 mL via INTRAVENOUS

## 2015-10-04 MED ORDER — DEXTROSE 5 % IV SOLN
2.0000 g | Freq: Once | INTRAVENOUS | Status: AC
  Administered 2015-10-04: 2 g via INTRAVENOUS
  Filled 2015-10-04: qty 2

## 2015-10-04 MED ORDER — ACETAMINOPHEN 325 MG PO TABS
650.0000 mg | ORAL_TABLET | Freq: Four times a day (QID) | ORAL | Status: DC | PRN
  Administered 2015-10-05: 650 mg via ORAL
  Filled 2015-10-04: qty 2

## 2015-10-04 MED ORDER — SODIUM CHLORIDE 0.9 % IV SOLN
2.0000 g | INTRAVENOUS | Status: AC
  Administered 2015-10-04: 2 g via INTRAVENOUS
  Filled 2015-10-04: qty 2000

## 2015-10-04 MED ORDER — ONDANSETRON HCL 4 MG/2ML IJ SOLN: 4.0000 mg | Freq: Four times a day (QID) | INTRAMUSCULAR | Status: DC | PRN

## 2015-10-04 MED ORDER — SODIUM CHLORIDE 0.9 % IV BOLUS (SEPSIS)
2000.0000 mL | Freq: Once | INTRAVENOUS | Status: AC
Start: 2015-10-04 — End: 2015-10-04
  Administered 2015-10-04: 2000 mL via INTRAVENOUS

## 2015-10-04 MED ORDER — ACETAMINOPHEN 650 MG RE SUPP
650.0000 mg | Freq: Four times a day (QID) | RECTAL | Status: DC | PRN
  Administered 2015-10-05: 650 mg via RECTAL
  Filled 2015-10-04: qty 1

## 2015-10-04 MED ORDER — LIDOCAINE-EPINEPHRINE 1 %-1:100000 IJ SOLN
10.0000 mL | Freq: Once | INTRAMUSCULAR | Status: AC
  Administered 2015-10-04: 10 mL via INTRADERMAL
  Filled 2015-10-04: qty 1

## 2015-10-04 MED ORDER — SODIUM CHLORIDE 0.9% FLUSH
3.0000 mL | Freq: Two times a day (BID) | INTRAVENOUS | Status: DC
  Administered 2015-10-04 – 2015-10-05 (×4): 3 mL via INTRAVENOUS

## 2015-10-04 MED ORDER — DEXTROSE 5 % IV SOLN
2.0000 g | INTRAVENOUS | Status: DC
  Administered 2015-10-05: 2 g via INTRAVENOUS
  Filled 2015-10-04: qty 2

## 2015-10-04 MED ORDER — SODIUM CHLORIDE 0.9 % IV BOLUS (SEPSIS)
1000.0000 mL | Freq: Once | INTRAVENOUS | Status: AC
  Administered 2015-10-04: 1000 mL via INTRAVENOUS

## 2015-10-04 MED ORDER — ENOXAPARIN SODIUM 30 MG/0.3ML ~~LOC~~ SOLN
30.0000 mg | SUBCUTANEOUS | Status: DC
  Administered 2015-10-05 – 2015-10-06 (×2): 30 mg via SUBCUTANEOUS
  Filled 2015-10-04 (×2): qty 0.3

## 2015-10-04 MED ORDER — SODIUM CHLORIDE 0.9 % IV BOLUS (SEPSIS)
1000.0000 mL | INTRAVENOUS | Status: AC
  Administered 2015-10-04 (×2): 1000 mL via INTRAVENOUS

## 2015-10-04 MED ORDER — CALCIUM GLUCONATE 10 % IV SOLN
2.0000 g | Freq: Once | INTRAVENOUS | Status: AC
Start: 2015-10-04 — End: 2015-10-04
  Administered 2015-10-04: 2 g via INTRAVENOUS
  Filled 2015-10-04: qty 20

## 2015-10-04 MED ORDER — VANCOMYCIN HCL 10 G IV SOLR
2000.0000 mg | Freq: Once | INTRAVENOUS | Status: AC
  Administered 2015-10-04: 2000 mg via INTRAVENOUS
  Filled 2015-10-04: qty 2000

## 2015-10-04 MED ORDER — DEXAMETHASONE SODIUM PHOSPHATE 10 MG/ML IJ SOLN
10.0000 mg | Freq: Once | INTRAMUSCULAR | Status: AC
  Administered 2015-10-04: 10 mg via INTRAVENOUS
  Filled 2015-10-04: qty 1

## 2015-10-04 MED ORDER — SODIUM CHLORIDE 0.9 % IV SOLN
2.0000 g | Freq: Once | INTRAVENOUS | Status: DC
  Filled 2015-10-04: qty 2000

## 2015-10-04 MED ORDER — SODIUM CHLORIDE 0.9 % IV SOLN
INTRAVENOUS | Status: DC
  Administered 2015-10-04: 1000 mL via INTRAVENOUS
  Administered 2015-10-04: 09:00:00 via INTRAVENOUS
  Administered 2015-10-04: 1000 mL via INTRAVENOUS
  Administered 2015-10-05 (×2): via INTRAVENOUS
  Administered 2015-10-05: 1000 mL via INTRAVENOUS

## 2015-10-04 MED ORDER — HYDROMORPHONE HCL 1 MG/ML IJ SOLN
1.0000 mg | Freq: Once | INTRAMUSCULAR | Status: AC
  Administered 2015-10-04: 1 mg via INTRAVENOUS

## 2015-10-04 MED ORDER — ACETAMINOPHEN 650 MG RE SUPP
975.0000 mg | Freq: Once | RECTAL | Status: AC
  Administered 2015-10-04: 975 mg via RECTAL
  Filled 2015-10-04: qty 1

## 2015-10-04 MED ORDER — HYDROMORPHONE HCL 1 MG/ML IJ SOLN
INTRAMUSCULAR | Status: AC
  Administered 2015-10-04: 1 mg via INTRAVENOUS
  Filled 2015-10-04: qty 1

## 2015-10-04 NOTE — Progress Notes (Signed)
Patient's temp is still to cold to register orally or axillary. Will obtain rectal temp again.

## 2015-10-04 NOTE — Progress Notes (Addendum)
Pharmacy Antibiotic Note  Cassandra ChouSamantha XXXSpradley is a 35 y.o. female admitted on 10/04/2015 with sepsis and suspected endocarditis (work-up pending) due to history of IVDA (UDS +cocaine).  Pharmacy has been consulted for Vancomycin, Rocephin, & Ampicillin dosing.  10/04/2015:  Febrile on admission (Tm 102.58F)  Mild leukocytosis (WBC 11.8) with > 10% bands  LA elevated (3.11)  Hypotensive, HR>90, RR>20  AKI- Scr elevated (4.5).  Estimated CrCl~15-5420ml/min.  Baseline Scr 0.7-0.8.  Plan:  Rocephin 2gm IV q12h  Ampicillin 2gm IV q8h  Vancomycin random level ~ 48hrs.  Goal trough level 15-20 mcg/ml. Monitor renal function and cx data      Temp (24hrs), Avg:100.9 F (38.3 C), Min:99.4 F (37.4 C), Max:102.3 F (39.1 C)   Recent Labs Lab 10/04/15 0445 10/04/15 0459 10/04/15 0500  WBC 11.8*  --   --   CREATININE 4.48*  --  4.50*  LATICACIDVEN  --  3.11*  --     CrCl cannot be calculated (Unknown ideal weight.).    Allergies  Allergen Reactions  . Wheat Anaphylaxis  . Ciprofloxacin Hcl     Reports as resistant   . Latex Hives, Itching and Rash    Antimicrobials this admission: 3/28 Vanc>>   3/28 Rocephin x1 dose 3/28 Ampicillin x1 dose 3/28 Cefepime >>  Dose adjustments this admission: 3/30 @ 0500 RVL=  Microbiology results: 3/28 BCx: sent 3/28 CSF cx: pending  Thank you for allowing pharmacy to be a part of this patient's care.  Junita PushMichelle Ji Feldner, PharmD, BCPS Pager: [phone] 10/04/2015 11:07 AM  Addendum: ID changing Rocephin and Ampicillin to Cefepime.  Patient had initial dose of Cefepime 2gm this morning.   Plan:  Cefepime 2gm IV q24h for CrCl<4330ml/min  Junita PushMichelle Osceola Depaz, PharmD, BCPS Pager: [phone] 10/04/2015@2 :32 PM

## 2015-10-04 NOTE — ED Notes (Signed)
Neuo checks not done at 8:00, patient is asleep

## 2015-10-04 NOTE — ED Notes (Signed)
Neuro checks not completed due to patient asleep

## 2015-10-04 NOTE — ED Notes (Signed)
Blood culture is positive for gram positive cocci inclusters. [facility]. Marylene Land(Angela)

## 2015-10-04 NOTE — ED Notes (Signed)
Contact info for mom:    Susann GivensLori Casten [phone]

## 2015-10-04 NOTE — Progress Notes (Addendum)
Received signout from Dr. Mora Bellmanni [name] is a 35 year old female with a past medical history significant for polysubstance abuse(IV heroin and cocaine previously) and tobacco abuse; who presented acutely altered with complaints of left-sided weakness for the last 2 days or so. Patient had reportedly was supposed to be on treatment for meningitis. UDS pending. CT scan of the brain showed signs suggestive of a right sided infarct that could be acute. Patient was seen by neurology who suspected patient having septic emboli from cardiac source recommended lumbar puncture(already completed) and [facility]. Patient was placed on broad-spectrum antibiotics of vancomycin and cefepime. Echocardiogram pending.

## 2015-10-04 NOTE — ED Notes (Signed)
Informed Dr. Mora Bellmanni of lactic acid of 3.11.

## 2015-10-04 NOTE — ED Notes (Signed)
Report given to Dufuraitlin, Charity fundraiserN.  [facility] has been called

## 2015-10-04 NOTE — ED Notes (Signed)
Patient transported to MRI 

## 2015-10-04 NOTE — Progress Notes (Signed)
Shift event note:  Notified by RN just [facility] hypotension (72/43), hypothermia temp: (94.6 R) and rapid respirations (RR-40's). [facility]. [facility] c/w sepsis related to endocarditis, h/o chronic IV drub abuse and CVA. IVF bolus and Bair hugger blanket ordered and pt assessed at bedside. Pt noted resting in NAD. She is responsive to verbal stimuli but very lethargic. She will open eyes and attempts to answer short questions. Skin is cool and dry w/ multiple abrasions, bruises and lesions many of which have small areas of localized cellulitis. BBS CTA w/ current RR of 26. 02 sats are 98% on 2L [place].  Assessment/Plan: 1. Hypotension: in setting of sepsis. BP has improved to 105/54 after small IVF bolus. Will continue to monitor. Has rec'd 4.5 L IVF's. Continue fluid resuscitation as indicated for hypotension.  2. Hypothermia: Also likely associated w/ sepsis. Continue Bair Hugger until temp normalizes. Continue to monitor closley in SDU.   Leanne ChangKatherine P. Caliana Spires, NP-C [facility] Pager [phone]

## 2015-10-04 NOTE — ED Notes (Signed)
Lab delay - pt in MRI

## 2015-10-04 NOTE — ED Notes (Signed)
Pt BIB husband who found pt at home with severely altered mental status; pt unable to walk/stand; pt unable to speak or follow commands; bilateral nonreactive pupils; multiple scars to left forearm; small bag of white powder found in pt's clothing; flushed down toilet by Autumn, RN with GDP witnessing.  BP 111/53; SpO2: 96%; Hr 140

## 2015-10-04 NOTE — Consult Note (Signed)
Patient with history of IVDU and multiple areas of infarct on MRI and CXR with multiple areas of opacities concerning for septic emboli. Will continue vancomycin and use cefepime.  [facility]. Thanks Staci RighterOMER, ROBERT, MD

## 2015-10-04 NOTE — Progress Notes (Signed)
STROKE TEAM PROGRESS NOTE   HISTORY OF PRESENT ILLNESS Cassandra Wall is a 35 y.o. female with a history of drug abuse, smoking presents with left-sided weakness since been going on for "a couple of days." She apparently has been feeling ill and has been getting some type of treatment for meningitis though I'm unable to clearly ascertain what type of provider was providing this treatment, or the treatment consisted of. Her boyfriend has been working, and has not seen her in a couple of days. Tonight around midnight he saw her and she told him that she was not moving her left side. She is able to indicate to me that it has been at least a couple of days since this started, but due to his severe dysarthria I am not able to clearly understand her for much history. Dr. Amada Jupiter tried to get her to answer questions by simple phrases, but she continues answering with more in depth answers which are not understandable. For several days, she's been complaining of severe headaches and fevers. He LKW is unclear. Patient was not administered IV t-PA secondary to delay in arrival. She presented to [facility] with plans to transfer and admit to stepdown at [facility] for further evaluation and treatment.   SUBJECTIVE (INTERVAL HISTORY) Her mom is at the bedside.  Overall she feels her condition is stable. She is compulsive and keep asking for fluid and not cooperative on exam. Updated mom about her diagnosis, current condition, treatment plan and prognosis. She is working with speech and passed swallow but not able to tolerate thin liquid.    OBJECTIVE Temp:  [94.6 F (34.8 C)-99.4 F (37.4 C)] 97.6 F (36.4 C) (03/29 1709) Pulse Rate:  [60-101] 72 (03/29 1709) Cardiac Rhythm:  [-] Normal sinus rhythm (03/29 0800) Resp:  [12-37] 17 (03/29 1709) BP: (72-115)/(41-78) 115/61 mmHg (03/29 1709) SpO2:  [91 %-100 %] 100 % (03/29 1709) Weight:  [113 lb 9.6 oz (51.529 kg)] 113 lb 9.6 oz (51.529 kg) (03/28 1935)  CBC:    Recent Labs Lab 10/04/15 0445 10/04/15 0500 10/04/15 0941 10/04/15 1232  WBC 11.8*  --   --  8.8  NEUTROABS 10.2*  --   --   --   HGB 8.6* 9.9*  --  8.3*  HCT 26.6* 29.0*  --  25.6*  MCV 79.4  --   --  81.5  PLT 86*  --  74* 69*    Basic Metabolic Panel:   Recent Labs Lab 10/04/15 0445 10/04/15 0500 10/04/15 1232  NA 133* 131* 134*  K 3.7 3.6 4.0  CL 98* 99* 106  CO2 17*  --  16*  GLUCOSE 98 91 137*  BUN 59* 52* 60*  CREATININE 4.48* 4.50* 3.88*  CALCIUM 7.4*  --  7.0*    Lipid Panel: No results found for: CHOL, TRIG, HDL, CHOLHDL, VLDL, LDLCALC HgbA1c: No results found for: HGBA1C Urine Drug Screen:     Component Value Date/Time   LABOPIA POSITIVE* 10/04/2015 0908   COCAINSCRNUR POSITIVE* 10/04/2015 0908   LABBENZ NONE DETECTED 10/04/2015 0908   AMPHETMU NONE DETECTED 10/04/2015 0908   THCU NONE DETECTED 10/04/2015 0908   LABBARB NONE DETECTED 10/04/2015 0908      IMAGING I have personally reviewed the radiological images below and agree with the radiology interpretations.  Ct Head Wo Contrast  10/04/2015  IMPRESSION: Suggestion of decreased attenuation involving the right external capsule and right temporal lobe, which may reflect an evolving acute right-sided infarct. No significant mass effect  or midline shift seen.   Mr Brain Wo Contrast  10/04/2015  IMPRESSION: 1. Acute/subacute large right MCA territory nonhemorrhagic infarct. 2. Additional foci of acute nonhemorrhagic infarct involves the right occipital lobe, left occipital parietal lobe, in the left cerebellum. 3. No other significant white matter disease or evidence for chronic abnormality is present.   Dg Chest Port 1 View  10/04/2015  IMPRESSION: Patchy bilateral airspace opacities raise concern for multifocal pneumonia.   2D Echocardiogram  - Left ventricle: The cavity size was normal. Systolic function was normal. The estimated ejection fraction was in the range of 50% to 55%. Wall motion  was normal; there were no regional wall motion abnormalities. Left ventricular diastolic function parameters were normal. - Aortic valve: There was a vegetation measuring 0.66 cm x 0.52 cm on the noncoronary cusp. Transvalvular velocity was within the normal range. There was no stenosis. There was no regurgitation. - Mitral valve: There was trivial regurgitation. - Right ventricle: The cavity size was normal. Wall thickness was normal. Systolic function was normal. - Atrial septum: No defect or patent foramen ovale was identified by color flow Doppler. - Tricuspid valve: There was a mobile, 0.9 cm (W) x 1.3 cm (L) vegetation on the atrial side of the septal leaflet. There was also a 2.6 cm x 0.8 cm mobile vegetation on the ventricular side of the anterior leaflet. There was moderate regurgitation. - Pulmonary arteries: Systolic pressure was within the normal range. PA peak pressure: 25 mm Hg (S). - Pericardium, extracardiac: A small pericardial effusion was identified. 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  MRA pending  EEG pending   PHYSICAL EXAM  Temp:  [94.6 F (34.8 C)-99.4 F (37.4 C)] 97.6 F (36.4 C) (03/29 1709) Pulse Rate:  [60-101] 72 (03/29 1709) Resp:  [12-37] 17 (03/29 1709) BP: (72-115)/(41-78) 115/61 mmHg (03/29 1709) SpO2:  [91 %-100 %] 100 % (03/29 1709) Weight:  [113 lb 9.6 oz (51.529 kg)] 113 lb 9.6 oz (51.529 kg) (03/28 1935)  General - thin built, well developed, agitated and compulsive.  Ophthalmologic - Fundi not visualized due to noncooperation.  Cardiovascular - Regular rate and rhythm.  Mental Status -  Awake, alert, keep asking for fluids, not cooperative on exam. Orientated to place and people, but not to time. Fluent on speech but paucity of meaningful sentences. Mild to moderate dysarthria, following simple commands, not cooperative on naming or repeating  Cranial Nerves II - XII - II -  inconsistent eye blinking with visual threat due to incooperation. III, IV, VI - Extraocular movements not cooperative on exam, right ptosis preference but able to open up. V - Facial sensation intact bilaterally. VII - left facial droop with decreased forehead wrinkles on frowning. VIII - Hearing & vestibular intact bilaterally. X - Palate elevates symmetrically. XI - Chin turning & shoulder shrug intact bilaterally. XII - Tongue protrusion to the left.  Motor Strength - The patient's strength was 4+/5 RUE and RLE, 3/5 LLE and LUE and pronator drift was present on the left.  Bulk was normal and fasciculations were absent.   Motor Tone - Muscle tone was assessed at the neck and appendages and was normal.  Reflexes - The patient's reflexes were 1+ in all extremities and she had no pathological reflexes.  Sensory - Light touch, temperature/pinprick were assessed and were symmetrical.    Coordination - not cooperative on exam.  Tremor was absent.  Gait and Station - not tested.  ASSESSMENT/PLAN [name] is a 35 y.o. female with history of polysubstance abuse, smoker, hepC positive, CKD, seizure, and anxiety presenting with left sided weakness. She did not receive IV t-PA due to delay in arrival.   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  lovenox for VTE prophylaxis DIET - DYS 1 Room service appropriate?: Yes; Fluid consistency:: Nectar Thick  No antithrombotic prior to admission, now on No antithrombotic. No antithrombotic indicated for endocarditis.  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   UDS positive for opiates and cocaine this admission  Tobacco abuse  Current smoker  Smoking cessation counseling provided  Other Stroke Risk Factors  Occasional ETOH use  Migraines  Other Active Problems  Hx psychosis  Hx seizures  Acute renal failure Cr 4.5  Thrombocytopenia - Hgb 8.6  Prolonged Q-T interval  hypothermia  Hospital day # 1  This patient is critically ill due to large right MCA infarct, sepsis with endocarditis, AKI and at significant risk of neurological worsening, death form recurrent infarct, hemorrhagic transformation, septic shock, renal failure. This patient's care requires constant monitoring of vital signs, hemodynamics, respiratory and cardiac monitoring, review of multiple databases, neurological assessment, discussion with family, other specialists and medical decision making of high complexity. I spent 35 minutes of neurocritical care time in the care of this patient.  Marvel Plan, MD PhD Stroke Neurology 10/05/2015 5:27 PM    To contact Stroke Continuity provider, please refer to [email or web address]. After hours, contact General Neurology
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
## 2015-10-04 NOTE — ED Notes (Signed)
Per Dr. Fayrene FearingJames, patient does not have spinal meninginitis at this time, explained white count in spinal fluid is due to her endocarditis.
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
## 2015-10-04 NOTE — Progress Notes (Signed)
  Echocardiogram 2D Echocardiogram has been performed.  [name], [name] 10/04/2015, 4:06 PM

## 2015-10-04 NOTE — ED Notes (Signed)
Two unsuccessful IV attempts by Ardine EngJennifer, Rn; MD [facility] currently attempting IV access

## 2015-10-04 NOTE — ED Notes (Signed)
Pt returned to room from CT

## 2015-10-04 NOTE — ED Notes (Signed)
Unable to collect labs patient not in the room  

## 2015-10-05 ENCOUNTER — Inpatient Hospital Stay (HOSPITAL_COMMUNITY): Payer: Medicaid Other

## 2015-10-05 DIAGNOSIS — I361 Nonrheumatic tricuspid (valve) insufficiency: Secondary | ICD-10-CM

## 2015-10-05 DIAGNOSIS — I330 Acute and subacute infective endocarditis: Secondary | ICD-10-CM

## 2015-10-05 DIAGNOSIS — D649 Anemia, unspecified: Secondary | ICD-10-CM | POA: Diagnosis present

## 2015-10-05 DIAGNOSIS — I76 Septic arterial embolism: Secondary | ICD-10-CM

## 2015-10-05 DIAGNOSIS — R079 Chest pain, unspecified: Secondary | ICD-10-CM

## 2015-10-05 DIAGNOSIS — Z8701 Personal history of pneumonia (recurrent): Secondary | ICD-10-CM | POA: Diagnosis present

## 2015-10-05 DIAGNOSIS — I4581 Long QT syndrome: Secondary | ICD-10-CM

## 2015-10-05 DIAGNOSIS — B9561 Methicillin susceptible Staphylococcus aureus infection as the cause of diseases classified elsewhere: Secondary | ICD-10-CM

## 2015-10-05 DIAGNOSIS — R4182 Altered mental status, unspecified: Secondary | ICD-10-CM

## 2015-10-05 DIAGNOSIS — I749 Embolism and thrombosis of unspecified artery: Secondary | ICD-10-CM

## 2015-10-05 DIAGNOSIS — I639 Cerebral infarction, unspecified: Secondary | ICD-10-CM | POA: Diagnosis present

## 2015-10-05 DIAGNOSIS — F1910 Other psychoactive substance abuse, uncomplicated: Secondary | ICD-10-CM

## 2015-10-05 DIAGNOSIS — I2690 Septic pulmonary embolism without acute cor pulmonale: Secondary | ICD-10-CM

## 2015-10-05 DIAGNOSIS — B182 Chronic viral hepatitis C: Secondary | ICD-10-CM

## 2015-10-05 DIAGNOSIS — F17210 Nicotine dependence, cigarettes, uncomplicated: Secondary | ICD-10-CM

## 2015-10-05 DIAGNOSIS — I351 Nonrheumatic aortic (valve) insufficiency: Secondary | ICD-10-CM

## 2015-10-05 DIAGNOSIS — N179 Acute kidney failure, unspecified: Secondary | ICD-10-CM

## 2015-10-05 DIAGNOSIS — I38 Endocarditis, valve unspecified: Secondary | ICD-10-CM

## 2015-10-05 DIAGNOSIS — I669 Occlusion and stenosis of unspecified cerebral artery: Secondary | ICD-10-CM

## 2015-10-05 LAB — HERPES SIMPLEX VIRUS(HSV) DNA BY PCR
HSV 1 DNA: NEGATIVE
HSV 2 DNA: NEGATIVE

## 2015-10-05 LAB — VDRL, CSF: VDRL Quant, CSF: NONREACTIVE

## 2015-10-05 LAB — GLUCOSE, CAPILLARY: GLUCOSE-CAPILLARY: 103 mg/dL — AB (ref 65–99)

## 2015-10-05 LAB — MRSA PCR SCREENING: MRSA by PCR: NEGATIVE

## 2015-10-05 LAB — HIV ANTIBODY (ROUTINE TESTING W REFLEX): HIV SCREEN 4TH GENERATION: NONREACTIVE

## 2015-10-05 LAB — RPR: RPR: NONREACTIVE

## 2015-10-05 MED ORDER — TRAMADOL HCL 50 MG PO TABS: 50.0000 mg | ORAL_TABLET | Freq: Four times a day (QID) | ORAL | Status: DC | PRN

## 2015-10-05 MED ORDER — CEFAZOLIN SODIUM-DEXTROSE 2-4 GM/100ML-% IV SOLN
2.0000 g | Freq: Two times a day (BID) | INTRAVENOUS | Status: DC
  Administered 2015-10-05 – 2015-10-06 (×2): 2 g via INTRAVENOUS
  Filled 2015-10-05 (×6): qty 100

## 2015-10-05 MED ORDER — LORAZEPAM 2 MG/ML IJ SOLN
0.5000 mg | Freq: Once | INTRAMUSCULAR | Status: AC | PRN
  Administered 2015-10-05: 0.5 mg via INTRAVENOUS
  Filled 2015-10-05: qty 1

## 2015-10-05 MED ORDER — CETYLPYRIDINIUM CHLORIDE 0.05 % MT LIQD
7.0000 mL | Freq: Two times a day (BID) | OROMUCOSAL | Status: DC
  Administered 2015-10-05 – 2015-10-06 (×3): 7 mL via OROMUCOSAL

## 2015-10-05 MED ORDER — RESOURCE THICKENUP CLEAR PO POWD
ORAL | Status: DC | PRN
  Filled 2015-10-05: qty 125

## 2015-10-05 MED ORDER — DOCUSATE SODIUM 100 MG PO CAPS
100.0000 mg | ORAL_CAPSULE | Freq: Two times a day (BID) | ORAL | Status: DC
  Administered 2015-10-05: 100 mg via ORAL
  Filled 2015-10-05 (×2): qty 1
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
## 2015-10-05 NOTE — Evaluation (Signed)
Physical Therapy Evaluation Patient Details Name: Cassandra Wall MRN: [id number] DOB: 1980-11-27 Today's Date: 10/05/2015   History of Present Illness  35 y.o. female with IVDU history, recently in drug rehab but left, continued IVDU here with AMS, somnolence and now noted to have Staph aureus bacteremia, pulmonary septic emboli and septic cerebral emboli. TTE notes two vegetations on tricuspid valve, one on aortic valve. + fever, chills. Mother at bedside and tells me her son died of endocarditis from drug use. TV with moderate regurgitation. Large R MCA and small foci R occipital, L occipital parietal and L cerebellar infarcts     Clinical Impression  Pt admitted with above diagnosis. Pt currently with functional limitations due to the deficits listed below (see PT Problem List). Pt presents with L side inattention and mildly pushes with R UE to L side in sitting. Pt requires Max verbal, tactile, and visual cues for functional mobility as pt does not want to move L side and leans to L side in all positions. Pt perseverating on "can you get me a sprite", and can only be redirected for very short periods. Pt will benefit from skilled PT to increase their independence and safety with mobility to allow discharge to the venue listed below.  Pt will benefit from CIR to increase functional mobility and decrease fall risk in order to be able to return home.      Follow Up Recommendations CIR;Supervision/Assistance - 24 hour    Equipment Recommendations  Other (comment) (TBD)    Recommendations for Other Services Rehab consult;OT consult     Precautions / Restrictions Precautions Precautions: Fall Restrictions Weight Bearing Restrictions: No      Mobility  Bed Mobility Overal bed mobility: Needs Assistance;+2 for physical assistance Bed Mobility: Supine to Sit     Supine to sit: +2 for physical assistance;HOB elevated;Total assist     General bed mobility comments: Pt  lethargic and resisted movement to the EOB. Pt pushing with R UE to L side with initial supine to sit, but put pt down on her R elbow and pt quit pushing.  Transfers Overall transfer level: Needs assistance Equipment used: 2 person hand held assist Transfers: Sit to/from [employer] Sit to Stand: +2 physical assistance;Mod assist Stand pivot transfers: Mod assist;+2 physical assistance       General transfer comment: Pt stood with verbal and tactile cues for initiation. She leans to the L but was able to pivot around to the chair. Pt has L side inattention and does not want  to move L LE to take steps but will with Max verbal and tactile cues for initiation and sequencing.   Ambulation/Gait             General Gait Details: deferred due to safety concerns.   Stairs            Wheelchair Mobility    Modified Rankin (Stroke Patients Only)       Balance Overall balance assessment: Needs assistance Sitting-balance support: Single extremity supported;Feet supported Sitting balance-Leahy Scale: Poor Sitting balance - Comments: Pt leans to left in sitting, but will correct with verbal and visual cues for midline. Does not sustain long.  Postural control: Left lateral lean Standing balance support: Bilateral upper extremity supported Standing balance-Leahy Scale: Zero Standing balance comment: Reliant on UE support and Mod A to remain upright. Leans to Left side.  Pertinent Vitals/Pain Pain Assessment: No/denies pain    Home Living Family/patient expects to be discharged to:: Unsure Living Arrangements: Spouse/significant other               Additional Comments: Mother was at bedside, unable to provide full history of home set up (unstable family situation). Lives with husband(boyfriend?). Mother states pt's 7y.o. son stays with her (pt does not care for child)     Prior Function Level of Independence:  Independent               Hand Dominance   Dominant Hand: Right    Extremity/Trunk Assessment   Upper Extremity Assessment: LUE deficits/detail       LUE Deficits / Details: grossly 4/5, L side inattention. Will resist movement if you try to move it.    Lower Extremity Assessment: LLE deficits/detail   LLE Deficits / Details: grossly 3+/5. L side inattention. Will not move if you ask her to move it, but if you move it, she will move against resistance.   Cervical / Trunk Assessment: Other exceptions  Communication   Communication: Expressive difficulties (perseverating on "can i have sprite")  Cognition Arousal/Alertness: Lethargic Behavior During Therapy: Anxious;Restless Overall Cognitive Status: Impaired/Different from baseline Area of Impairment: Orientation;Memory;Attention;Following commands;Safety/judgement;Awareness;Problem solving Orientation Level: Place;Time;Situation;Disoriented to   Memory: Decreased short-term memory;Decreased recall of precautions Following Commands: Follows one step commands inconsistently Safety/Judgement: Decreased awareness of safety;Decreased awareness of deficits Awareness: Intellectual Problem Solving: Slow processing;Decreased initiation;Difficulty sequencing;Requires verbal cues;Requires tactile cues General Comments: Pt perseverating on "can I have sprite", Very anxious and restless. Requires Max verbal and tactile cues.     General Comments General comments (skin integrity, edema, etc.): Pt asking for sprite the entire PT session, but pt has not followed swallow evaluation yet. Mother at bedside at time of evaluation but was unable to provide home set up informaiton. Unsure of family dynamics.     Exercises        Assessment/Plan    PT Assessment Patient needs continued PT services  PT Diagnosis Difficulty walking;Abnormality of gait;Generalized weakness;Altered mental status   PT Problem List Decreased  strength;Decreased activity tolerance;Decreased balance;Decreased mobility;Decreased coordination;Decreased cognition;Decreased knowledge of use of DME;Decreased safety awareness;Decreased knowledge of precautions;Cardiopulmonary status limiting activity  PT Treatment Interventions DME instruction;Gait training;Stair training;Functional mobility training;Therapeutic activities;Therapeutic exercise;Balance training;Neuromuscular re-education;Patient/family education   PT Goals (Current goals can be found in the Care Plan section) Acute Rehab PT Goals Patient Stated Goal: none stated PT Goal Formulation: Patient unable to participate in goal setting Time For Goal Achievement: 10/19/15 Potential to Achieve Goals: Good    Frequency Min 4X/week   Barriers to discharge Decreased caregiver support unsure of family availability.     Co-evaluation               End of Session Equipment Utilized During Treatment: Gait belt;Oxygen Activity Tolerance: Patient tolerated treatment well Patient left: in chair;with call bell/phone within reach;with nursing/sitter in room;with chair alarm set;with family/visitor present Nurse Communication: Mobility status         Time: [id number] PT Time Calculation (min) (ACUTE ONLY): 27 min   Charges:   PT Evaluation $PT Eval Moderate Complexity: 1 Procedure PT Treatments $Therapeutic Activity: 8-22 mins   PT G Codes:       Everlean CherryJenna Grady Mohabir, SPT Everlean CherryJenna Joshuah Minella 10/05/2015, 1:30 PM

## 2015-10-05 NOTE — Plan of Care (Signed)
Problem: Education: Goal: Knowledge of disease or condition will improve Outcome: Progressing Family and patient given book on stroke information. Goal: Knowledge of patient specific risk factors addressed and post discharge goals established will improve Outcome: Progressing Patient will require teaching about cessation of drug use and what signs to look for that indicate another stroke. This has been mentioned to patient and mother; however, patient unable to grasp at this time.  Problem: Nutrition: Goal: Risk of aspiration will decrease Outcome: Progressing Speech evaluation performed and patient given diet for Dys 1, nectar thick.
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
## 2015-10-05 NOTE — Progress Notes (Signed)
  Subjective: Patient examined and echocardiogram, chest x-ray personally reviewed. 35 year old Caucasian female IV drug abuser admitted with staph bacteremia, large right hemisphere embolic stroke, septic emboli to the lung and acute renal failure from possible renal emboli. Echocardiogram shows a small vegetation on the aortic valve without significant aortic insufficiency. There is a less than 1 cm vegetation on the tricuspid valve with moderate tricuspid insufficiency but without right ventricular dilatation or dysfunction.  The patient is currently with left hemi-per cysts, monitoring incoherently in the subacute unit. She is been started on antibiotics by infectious disease She has well-known IV drug abuse and in fact her brother recently died from a drug overdose.  CT surgical evaluation requested based on the diagnosis of endocarditis which has caused a severe stroke --no evidence of significant hemodynamic effect from her endocarditis.   Objective: Vital signs in last 24 hours: Temp:  [94.9 F (34.9 C)-97.6 F (36.4 C)] 97.6 F (36.4 C) (03/29 2000) Pulse Rate:  [71-101] 91 (03/29 2000) Cardiac Rhythm:  [-] Normal sinus rhythm (03/29 2011) Resp:  [14-30] 21 (03/29 2000) BP: (96-145)/(54-85) 145/72 mmHg (03/29 2000) SpO2:  [96 %-100 %] 100 % (03/29 2000)  Hemodynamic parameters for last 24 hours:   afebrile, sinus rhythm  Intake/Output from previous day: 03/28 0701 - 03/29 0700 In: 3203 [I.V.:1653; IV Piggyback:1550] Out: 600 [Urine:600] Intake/Output this shift:      Exam    General- monitoring incoherently not moving left side   Lungs- scattered rhonchi   Cor- regular rate and rhythm, 2/6 murmur of TR   Abdomen- soft, non-tender without organomegaly   Extremities - warm, non-tender, minimal edema, multiple injection site markings and carbuncle formation   Neuro-left hemiparesis      Lab Results:  Recent Labs  10/04/15 0445 10/04/15 0500 10/04/15 0941  10/04/15 1232  WBC 11.8*  --   --  8.8  HGB 8.6* 9.9*  --  8.3*  HCT 26.6* 29.0*  --  25.6*  PLT 86*  --  74* 69*   BMET:  Recent Labs  10/04/15 0445 10/04/15 0500 10/04/15 1232  NA 133* 131* 134*  K 3.7 3.6 4.0  CL 98* 99* 106  CO2 17*  --  16*  GLUCOSE 98 91 137*  BUN 59* 52* 60*  CREATININE 4.48* 4.50* 3.88*  CALCIUM 7.4*  --  7.0*    PT/INR:  Recent Labs  10/04/15 0941  LABPROT 17.2*  INR 1.39   ABG    Component Value Date/Time   TCO2 18 10/04/2015 0500   CBG (last 3)   Recent Labs  10/04/15 2039 10/05/15 1746  GLUCAP 99 103*    Assessment/Plan: 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 She has positive blood culture for staph aureus. Her aortic valve is without significant aortic insufficiency and her tricuspid regurgitation is moderate and well compensated. Recommend continued IV antibiotics and medical therapy. With the patient's recent large stroke, cardio- pulmonary bypass and cardiac valve replacement would not benefit the patient and would  worsen her neurologic status.   LOS: 1 day    Kathlee Nationseter Van Trigt III 10/05/2015
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
## 2015-10-05 NOTE — Plan of Care (Signed)
Problem: Acute Rehab PT Goals(only PT should resolve) Goal: Patient Will Perform Sitting Balance While maintaining midline with verbal and visual cues for midline.  Goal: Patient Will Transfer Sit To/From Stand Verbal and visual cues for midline.  Goal: Pt Will Transfer Bed To Chair/Chair To Bed Verbal cues for initiation and sequencing.  Goal: Pt Will Ambulate With verbal and tactile cues for sequencing.

## 2015-10-05 NOTE — Progress Notes (Signed)
EEG completed; results pending.    

## 2015-10-06 ENCOUNTER — Inpatient Hospital Stay (HOSPITAL_COMMUNITY): Payer: Medicaid Other

## 2015-10-06 ENCOUNTER — Encounter (HOSPITAL_COMMUNITY): Payer: Medicaid Other

## 2015-10-06 DIAGNOSIS — I339 Acute and subacute endocarditis, unspecified: Secondary | ICD-10-CM | POA: Insufficient documentation

## 2015-10-06 DIAGNOSIS — L8990 Pressure ulcer of unspecified site, unspecified stage: Secondary | ICD-10-CM | POA: Diagnosis present

## 2015-10-06 DIAGNOSIS — N179 Acute kidney failure, unspecified: Secondary | ICD-10-CM

## 2015-10-06 DIAGNOSIS — D649 Anemia, unspecified: Secondary | ICD-10-CM

## 2015-10-06 DIAGNOSIS — A419 Sepsis, unspecified organism: Secondary | ICD-10-CM

## 2015-10-06 DIAGNOSIS — F19939 Other psychoactive substance use, unspecified with withdrawal, unspecified: Secondary | ICD-10-CM

## 2015-10-06 DIAGNOSIS — I39 Endocarditis and heart valve disorders in diseases classified elsewhere: Secondary | ICD-10-CM | POA: Insufficient documentation

## 2015-10-06 DIAGNOSIS — Z978 Presence of other specified devices: Secondary | ICD-10-CM | POA: Insufficient documentation

## 2015-10-06 DIAGNOSIS — I639 Cerebral infarction, unspecified: Secondary | ICD-10-CM

## 2015-10-06 DIAGNOSIS — R4182 Altered mental status, unspecified: Secondary | ICD-10-CM | POA: Insufficient documentation

## 2015-10-06 DIAGNOSIS — J9601 Acute respiratory failure with hypoxia: Secondary | ICD-10-CM

## 2015-10-06 LAB — COMPREHENSIVE METABOLIC PANEL
ALK PHOS: 102 U/L (ref 38–126)
ALK PHOS: 96 U/L (ref 38–126)
ALT: 21 U/L (ref 14–54)
ALT: 20 U/L (ref 14–54)
ANION GAP: 11 (ref 5–15)
AST: 36 U/L (ref 15–41)
AST: 35 U/L (ref 15–41)
Albumin: 1.6 g/dL — ABNORMAL LOW (ref 3.5–5.0)
Albumin: 1.6 g/dL — ABNORMAL LOW (ref 3.5–5.0)
Anion gap: 11 (ref 5–15)
BILIRUBIN TOTAL: 0.4 mg/dL (ref 0.3–1.2)
BUN: 60 mg/dL — ABNORMAL HIGH (ref 6–20)
BUN: 53 mg/dL — AB (ref 6–20)
CALCIUM: 7.4 mg/dL — AB (ref 8.9–10.3)
CALCIUM: 7.8 mg/dL — AB (ref 8.9–10.3)
CHLORIDE: 124 mmol/L — AB (ref 101–111)
CO2: 11 mmol/L — ABNORMAL LOW (ref 22–32)
CO2: 13 mmol/L — AB (ref 22–32)
CREATININE: 1.41 mg/dL — AB (ref 0.44–1.00)
CREATININE: 1.28 mg/dL — AB (ref 0.44–1.00)
Chloride: 122 mmol/L — ABNORMAL HIGH (ref 101–111)
GFR, EST AFRICAN AMERICAN: 56 mL/min — AB (ref >60)
GFR, EST NON AFRICAN AMERICAN: 48 mL/min — AB (ref >60)
GFR, EST NON AFRICAN AMERICAN: 54 mL/min — AB (ref >60)
Glucose, Bld: 126 mg/dL — ABNORMAL HIGH (ref 65–99)
Glucose, Bld: 99 mg/dL (ref 65–99)
Potassium: 3.4 mmol/L — ABNORMAL LOW (ref 3.5–5.1)
Potassium: 3.8 mmol/L (ref 3.5–5.1)
SODIUM: 144 mmol/L (ref 135–145)
SODIUM: 148 mmol/L — AB (ref 135–145)
TOTAL PROTEIN: 5 g/dL — AB (ref 6.5–8.1)
Total Bilirubin: 0.6 mg/dL (ref 0.3–1.2)
Total Protein: 4.8 g/dL — ABNORMAL LOW (ref 6.5–8.1)

## 2015-10-06 LAB — CULTURE, BLOOD (SINGLE)

## 2015-10-06 LAB — CBC
HCT: 21.8 % — ABNORMAL LOW (ref 36.0–46.0)
HEMOGLOBIN: 7.1 g/dL — AB (ref 12.0–15.0)
MCH: 26.2 pg (ref 26.0–34.0)
MCHC: 32.6 g/dL (ref 30.0–36.0)
MCV: 80.4 fL (ref 78.0–100.0)
Platelets: 60 10*3/uL — ABNORMAL LOW (ref 150–400)
RBC: 2.71 MIL/uL — AB (ref 3.87–5.11)
RDW: 15.5 % (ref 11.5–15.5)
WBC: 9.7 10*3/uL (ref 4.0–10.5)

## 2015-10-06 LAB — MAGNESIUM
Magnesium: 2.6 mg/dL — ABNORMAL HIGH (ref 1.7–2.4)
Magnesium: 2.5 mg/dL — ABNORMAL HIGH (ref 1.7–2.4)

## 2015-10-06 LAB — LACTIC ACID, PLASMA
Lactic Acid, Venous: 2.6 mmol/L (ref 0.5–2.0)
Lactic Acid, Venous: 1.7 mmol/L (ref 0.5–2.0)

## 2015-10-06 LAB — CBC WITH DIFFERENTIAL/PLATELET
BASOS ABS: 0 10*3/uL (ref 0.0–0.1)
Basophils Relative: 0 %
EOS ABS: 0 10*3/uL (ref 0.0–0.7)
Eosinophils Relative: 0 %
HEMATOCRIT: 24.3 % — AB (ref 36.0–46.0)
Hemoglobin: 8 g/dL — ABNORMAL LOW (ref 12.0–15.0)
LYMPHS PCT: 8 %
Lymphs Abs: 0.9 10*3/uL (ref 0.7–4.0)
MCH: 26.3 pg (ref 26.0–34.0)
MCHC: 32.9 g/dL (ref 30.0–36.0)
MCV: 79.9 fL (ref 78.0–100.0)
MONO ABS: 0.9 10*3/uL (ref 0.1–1.0)
Monocytes Relative: 8 %
NEUTROS PCT: 84 %
Neutro Abs: 9.8 10*3/uL — ABNORMAL HIGH (ref 1.7–7.7)
Platelets: 75 10*3/uL — ABNORMAL LOW (ref 150–400)
RBC: 3.04 MIL/uL — ABNORMAL LOW (ref 3.87–5.11)
RDW: 15.4 % (ref 11.5–15.5)
WBC: 11.6 10*3/uL — AB (ref 4.0–10.5)

## 2015-10-06 LAB — POCT I-STAT 3, ART BLOOD GAS (G3+)
Acid-base deficit: 12 mmol/L — ABNORMAL HIGH (ref 0.0–2.0)
Bicarbonate: 13 mEq/L — ABNORMAL LOW (ref 20.0–24.0)
O2 SAT: 99 %
PCO2 ART: 25.6 mmHg — AB (ref 35.0–45.0)
Patient temperature: 98.8
TCO2: 14 mmol/L (ref 0–100)
pH, Arterial: 7.316 — ABNORMAL LOW (ref 7.350–7.450)
pO2, Arterial: 129 mmHg — ABNORMAL HIGH (ref 80.0–100.0)

## 2015-10-06 LAB — TSH: TSH: 1.758 u[IU]/mL (ref 0.350–4.500)

## 2015-10-06 LAB — BASIC METABOLIC PANEL
ANION GAP: 9 (ref 5–15)
BUN: 49 mg/dL — ABNORMAL HIGH (ref 6–20)
CALCIUM: 7.7 mg/dL — AB (ref 8.9–10.3)
CO2: 14 mmol/L — ABNORMAL LOW (ref 22–32)
Chloride: 122 mmol/L — ABNORMAL HIGH (ref 101–111)
Creatinine, Ser: 1.06 mg/dL — ABNORMAL HIGH (ref 0.44–1.00)
Glucose, Bld: 99 mg/dL (ref 65–99)
Potassium: 3.9 mmol/L (ref 3.5–5.1)
Sodium: 145 mmol/L (ref 135–145)

## 2015-10-06 LAB — FIBRINOGEN: FIBRINOGEN: 343 mg/dL (ref 204–475)

## 2015-10-06 LAB — BLOOD GAS, ARTERIAL
Acid-base deficit: 12.5 mmol/L — ABNORMAL HIGH (ref 0.0–2.0)
Bicarbonate: 11.3 mEq/L — ABNORMAL LOW (ref 20.0–24.0)
DRAWN BY: 280981
FIO2: 0.21
O2 SAT: 97.3 %
PH ART: 7.416 (ref 7.350–7.450)
Patient temperature: 98.6
TCO2: 11.9 mmol/L (ref 0–100)
pCO2 arterial: 17.9 mmHg — CL (ref 35.0–45.0)
pO2, Arterial: 107 mmHg — ABNORMAL HIGH (ref 80.0–100.0)

## 2015-10-06 LAB — LIPID PANEL
CHOLESTEROL: 102 mg/dL (ref 0–200)
Triglycerides: 193 mg/dL — ABNORMAL HIGH (ref <150)
VLDL: 39 mg/dL (ref 0–40)

## 2015-10-06 LAB — GLUCOSE, CAPILLARY: GLUCOSE-CAPILLARY: 90 mg/dL (ref 65–99)

## 2015-10-06 LAB — PROTIME-INR
INR: 1.29 (ref 0.00–1.49)
Prothrombin Time: 16.3 seconds — ABNORMAL HIGH (ref 11.6–15.2)

## 2015-10-06 LAB — FOLATE: Folate: 8.3 ng/mL (ref >5.9)

## 2015-10-06 LAB — VANCOMYCIN, RANDOM: VANCOMYCIN RM: 10 ug/mL

## 2015-10-06 LAB — PHOSPHORUS: Phosphorus: 3.6 mg/dL (ref 2.5–4.6)

## 2015-10-06 LAB — VITAMIN B12: Vitamin B-12: 911 pg/mL (ref 180–914)

## 2015-10-06 LAB — ABO/RH: ABO/RH(D): O POS

## 2015-10-06 LAB — PREPARE RBC (CROSSMATCH)

## 2015-10-06 LAB — LACTATE DEHYDROGENASE: LDH: 232 U/L — AB (ref 98–192)

## 2015-10-06 MED ORDER — NAFCILLIN SODIUM 2 G IJ SOLR
2.0000 g | INTRAMUSCULAR | Status: DC
  Filled 2015-10-06: qty 2000

## 2015-10-06 MED ORDER — FENTANYL CITRATE (PF) 100 MCG/2ML IJ SOLN
50.0000 ug | Freq: Once | INTRAMUSCULAR | Status: AC
  Administered 2015-10-06: 50 ug via INTRAVENOUS

## 2015-10-06 MED ORDER — HALOPERIDOL LACTATE 5 MG/ML IJ SOLN
2.0000 mg | Freq: Four times a day (QID) | INTRAMUSCULAR | Status: DC | PRN
  Administered 2015-10-06: 2 mg via INTRAVENOUS
  Filled 2015-10-06: qty 1

## 2015-10-06 MED ORDER — CEFAZOLIN SODIUM-DEXTROSE 2-4 GM/100ML-% IV SOLN
2.0000 g | Freq: Three times a day (TID) | INTRAVENOUS | Status: DC
  Filled 2015-10-06 (×2): qty 100

## 2015-10-06 MED ORDER — CHLORHEXIDINE GLUCONATE 0.12% ORAL RINSE (MEDLINE KIT)
15.0000 mL | Freq: Two times a day (BID) | OROMUCOSAL | Status: DC
  Administered 2015-10-06 – 2015-10-23 (×34): 15 mL via OROMUCOSAL

## 2015-10-06 MED ORDER — POTASSIUM CHLORIDE 10 MEQ/100ML IV SOLN
INTRAVENOUS | Status: AC
  Administered 2015-10-06: 18:00:00
  Filled 2015-10-06: qty 100

## 2015-10-06 MED ORDER — SODIUM BICARBONATE 8.4 % IV SOLN: 100.0000 meq | Freq: Once | INTRAVENOUS | Status: DC

## 2015-10-06 MED ORDER — MIDAZOLAM HCL 2 MG/2ML IJ SOLN
4.0000 mg | Freq: Once | INTRAMUSCULAR | Status: AC
  Administered 2015-10-06: 4 mg via INTRAVENOUS

## 2015-10-06 MED ORDER — SUCCINYLCHOLINE CHLORIDE 20 MG/ML IJ SOLN
80.0000 mg | Freq: Once | INTRAMUSCULAR | Status: AC
  Administered 2015-10-06: 80 mg via INTRAVENOUS

## 2015-10-06 MED ORDER — VITAL HIGH PROTEIN PO LIQD
1000.0000 mL | ORAL | Status: DC
  Administered 2015-10-07: 1000 mL

## 2015-10-06 MED ORDER — MIDAZOLAM HCL 2 MG/2ML IJ SOLN
INTRAMUSCULAR | Status: AC
Start: 2015-10-06 — End: 2015-10-06
  Administered 2015-10-06: 4 mg via INTRAVENOUS
  Filled 2015-10-06: qty 4

## 2015-10-06 MED ORDER — POTASSIUM CHLORIDE 10 MEQ/100ML IV SOLN
10.0000 meq | INTRAVENOUS | Status: DC
  Administered 2015-10-06 (×3): 10 meq via INTRAVENOUS
  Filled 2015-10-06 (×3): qty 100

## 2015-10-06 MED ORDER — PROPOFOL 1000 MG/100ML IV EMUL
0.0000 ug/kg/min | INTRAVENOUS | Status: DC
  Administered 2015-10-06: 20 ug/kg/min via INTRAVENOUS
  Administered 2015-10-07: 50 ug/kg/min via INTRAVENOUS
  Administered 2015-10-07: 25 ug/kg/min via INTRAVENOUS
  Administered 2015-10-07: 40 ug/kg/min via INTRAVENOUS
  Administered 2015-10-08 (×2): 30 ug/kg/min via INTRAVENOUS
  Administered 2015-10-08: 45 ug/kg/min via INTRAVENOUS
  Administered 2015-10-09: 30 ug/kg/min via INTRAVENOUS
  Filled 2015-10-06 (×7): qty 100

## 2015-10-06 MED ORDER — FENTANYL CITRATE (PF) 100 MCG/2ML IJ SOLN
INTRAMUSCULAR | Status: AC
  Administered 2015-10-06: 200 ug via INTRAVENOUS
  Filled 2015-10-06: qty 4

## 2015-10-06 MED ORDER — PRO-STAT SUGAR FREE PO LIQD
30.0000 mL | Freq: Two times a day (BID) | ORAL | Status: DC
  Administered 2015-10-06 – 2015-10-07 (×2): 30 mL
  Filled 2015-10-06 (×3): qty 30

## 2015-10-06 MED ORDER — DOCUSATE SODIUM 50 MG/5ML PO LIQD: 100.0000 mg | Freq: Two times a day (BID) | ORAL | Status: DC | PRN

## 2015-10-06 MED ORDER — SODIUM CHLORIDE 0.9 % IV SOLN: Freq: Once | INTRAVENOUS | Status: DC

## 2015-10-06 MED ORDER — DEXTROSE 5 % IV SOLN: INTRAVENOUS | Status: DC

## 2015-10-06 MED ORDER — LORAZEPAM 2 MG/ML IJ SOLN
0.5000 mg | INTRAMUSCULAR | Status: DC | PRN
  Administered 2015-10-06: 0.5 mg via INTRAVENOUS
  Filled 2015-10-06: qty 1

## 2015-10-06 MED ORDER — DEXMEDETOMIDINE HCL IN NACL 400 MCG/100ML IV SOLN
0.4000 ug/kg/h | INTRAVENOUS | Status: DC
  Administered 2015-10-06: 0.4 ug/kg/h via INTRAVENOUS
  Filled 2015-10-06: qty 100

## 2015-10-06 MED ORDER — PANTOPRAZOLE SODIUM 40 MG IV SOLR: 40.0000 mg | Freq: Every day | INTRAVENOUS | Status: DC

## 2015-10-06 MED ORDER — PROPOFOL 1000 MG/100ML IV EMUL
INTRAVENOUS | Status: AC
  Administered 2015-10-06: 20 ug/kg/min via INTRAVENOUS
  Filled 2015-10-06: qty 100

## 2015-10-06 MED ORDER — SODIUM BICARBONATE 8.4 % IV SOLN
INTRAVENOUS | Status: DC
Start: 2015-10-06 — End: 2015-10-10
  Administered 2015-10-06 – 2015-10-09 (×5): via INTRAVENOUS
  Filled 2015-10-06 (×9): qty 100

## 2015-10-06 MED ORDER — FENTANYL CITRATE (PF) 100 MCG/2ML IJ SOLN
200.0000 ug | Freq: Once | INTRAMUSCULAR | Status: AC
  Administered 2015-10-06: 200 ug via INTRAVENOUS

## 2015-10-06 MED ORDER — SODIUM CHLORIDE 0.9 % IV SOLN
25.0000 ug/h | INTRAVENOUS | Status: DC
  Administered 2015-10-06: 100 ug/h via INTRAVENOUS
  Administered 2015-10-07 – 2015-10-08 (×2): 300 ug/h via INTRAVENOUS
  Administered 2015-10-08: 400 ug/h via INTRAVENOUS
  Administered 2015-10-08 (×2): 300 ug/h via INTRAVENOUS
  Administered 2015-10-09: 50 ug/h via INTRAVENOUS
  Administered 2015-10-09: 300 ug/h via INTRAVENOUS
  Filled 2015-10-06 (×9): qty 50

## 2015-10-06 MED ORDER — NAFCILLIN SODIUM 2 G IJ SOLR
2.0000 g | INTRAVENOUS | Status: DC
  Administered 2015-10-06 – 2015-10-13 (×41): 2 g via INTRAVENOUS
  Filled 2015-10-06 (×46): qty 2000

## 2015-10-06 MED ORDER — ETOMIDATE 2 MG/ML IV SOLN
20.0000 mg | Freq: Once | INTRAVENOUS | Status: AC
  Administered 2015-10-06: 20 mg via INTRAVENOUS

## 2015-10-06 MED ORDER — PANTOPRAZOLE SODIUM 40 MG PO PACK
40.0000 mg | PACK | ORAL | Status: DC
  Administered 2015-10-07 – 2015-10-12 (×6): 40 mg
  Filled 2015-10-06 (×10): qty 20

## 2015-10-06 MED ORDER — FENTANYL BOLUS VIA INFUSION
50.0000 ug | INTRAVENOUS | Status: DC | PRN
  Filled 2015-10-06: qty 50

## 2015-10-06 MED ORDER — ACETAMINOPHEN 160 MG/5ML PO SOLN
650.0000 mg | Freq: Four times a day (QID) | ORAL | Status: DC | PRN
  Administered 2015-10-08 – 2015-10-24 (×12): 650 mg via ORAL
  Filled 2015-10-06 (×13): qty 20.3

## 2015-10-06 MED ORDER — ANTISEPTIC ORAL RINSE SOLUTION (CORINZ)
7.0000 mL | Freq: Four times a day (QID) | OROMUCOSAL | Status: DC
  Administered 2015-10-07 – 2015-10-23 (×67): 7 mL via OROMUCOSAL

## 2015-10-06 NOTE — Progress Notes (Signed)
Cardiologist: New Subjective:  Now moaning, hitting her heel against the side bed rail.  Objective:  Vital Signs in the last 24 hours: Temp:  [97 F (36.1 C)-98.3 F (36.8 C)] 98.3 F (36.8 C) (03/30 0700) Pulse Rate:  [72-106] 96 (03/30 0700) Resp:  [15-36] 36 (03/30 0700) BP: (100-145)/(57-85) 127/85 mmHg (03/30 0404) SpO2:  [97 %-100 %] 97 % (03/30 0404)  Intake/Output from previous day: 03/29 0701 - 03/30 0700 In: 4888.5 [P.O.:1440; I.V.:3248.5; IV Piggyback:200] Out: 750 [Urine:750]   Physical Exam: General: Well developed, well nourished, confused Head:  Normocephalic and atraumatic. Lungs: Clear to auscultation and percussion. Heart: Normal S1 and S2.  1/6 systolic left lower sternal border murmur, no rubs or gallops.  Abdomen: soft, non-tender, positive bowel sounds. Extremities: No clubbing or cyanosis. No edema. Neurologic: Alert but somewhat combative    Lab Results:  Recent Labs  10/04/15 1232 10/06/15 0832  WBC 8.8 11.6*  HGB 8.3* 8.0*  PLT 69* PENDING    Recent Labs  10/04/15 1232 10/06/15 0832  NA 134* 144  K 4.0 3.4*  CL 106 122*  CO2 16* 11*  GLUCOSE 137* 126*  BUN 60* 60*  CREATININE 3.88* 1.41*   No results for input(s): TROPONINI in the last 72 hours.  Invalid input(s): CK, MB Hepatic Function Panel  Recent Labs  10/06/15 0832  PROT 5.0*  ALBUMIN 1.6*  AST 36  ALT 21  ALKPHOS 102  BILITOT 0.4    Imaging: Mr Brain Wo Contrast  10/04/2015  CLINICAL DATA:  Stroke.  Two day history of left-sided weakness. EXAM: MRI HEAD WITHOUT CONTRAST TECHNIQUE: Multiplanar, multiecho pulse sequences of the brain and surrounding structures were obtained without intravenous contrast. COMPARISON:  None. FINDINGS: A large right MCA territory nonhemorrhagic infarct is present. There are smaller 1 cm foci of acute nonhemorrhagic infarct involving the right occipital lobe and left occipital/ parietal lobe. Two punctate acute nonhemorrhagic  infarcts involve the left cerebellum. T2 changes are associated with the areas of acute nonhemorrhagic infarct. There is some effacement of the sulci within the involved territories. Local mass effect is present. There is no midline shift. There is minimal effacement of the right lateral ventricle. Abnormal signal is present in the distal right M1 segment compatible with slow or occluded flow. Flow is present or proximally within the major intracranial arteries. Flow is present in the posterior circulation. The globes and orbits are intact. Mild mucosal thickening is present in the anterior right ethmoid air cells and frontal sinus. The remaining paranasal sinuses and the mastoid air cells are clear. Skullbase is within normal limits. Midline sagittal images are unremarkable. Decreased marrow signal is present in the upper cervical spine. IMPRESSION: 1. Acute/subacute large right MCA territory nonhemorrhagic infarct. 2. Additional foci of acute nonhemorrhagic infarct involves the right occipital lobe, left occipital parietal lobe, in the left cerebellum. 3. No other significant white matter disease or evidence for chronic abnormality is present. These results will be called to the ordering clinician or representative by the Radiologist Assistant, and communication documented in the PACS or zVision Dashboard. Electronically Signed   By: Marin Roberts M.D.   On: 10/04/2015 12:44   US Renal  10/05/2015  CLINICAL DATA:  Acute kidney injury. EXAM: RENAL / URINARY TRACT ULTRASOUND COMPLETE COMPARISON:  None. FINDINGS: Right Kidney: Length: 12.9 cm. Increased parenchymal echogenicity. No mass or hydronephrosis visualized. Left Kidney: Length: 11.2 cm. Increased parenchymal echogenicity. No mass or hydronephrosis visualized. Bladder: Partially collapsed around a Foley  catheter IMPRESSION: 1. No hydronephrosis. 2. Increased parenchymal echogenicity bilaterally compatible with chronic medical renal disease.  Electronically Signed   By: Signa Kellaylor  Stroud M.D.   On: 10/05/2015 09:44   Personally viewed.   Telemetry: No adverse arrhythmias Personally viewed.   EKG:  Sinus rhythm Personally viewed.  Cardiac Studies:  Tricuspid valve vegetation, large, small aortic valve vegetation 0.6 cm trivial aortic regurgitation. Normal EF  Meds: Scheduled Meds: . antiseptic oral rinse  7 mL Mouth Rinse BID  .  ceFAZolin (ANCEF) IV  2 g Intravenous Q12H  . docusate sodium  100 mg Oral BID  . enoxaparin (LOVENOX) injection  30 mg Subcutaneous Q24H  . sodium chloride flush  3 mL Intravenous Q12H   Continuous Infusions: . sodium chloride 75 mL/hr at 10/06/15 0900   PRN Meds:.acetaminophen **OR** acetaminophen, ondansetron **OR** ondansetron (ZOFRAN) IV, RESOURCE THICKENUP CLEAR, traMADol  Assessment/Plan:  Principal Problem:   Sepsis (HCC) secondary to endocarditis in an IV drug abuser Active Problems:   IV drug abuse   Acute renal failure (ARF) (HCC)   Thrombocytopenia (HCC)   Septic embolism (HCC)   Acute embolic stroke (HCC)   Prolonged Q-T interval on ECG   Multifocal Pneumonia, due to septic emboli   Anemia   Stroke (cerebrum) (HCC)   Pressure ulcer  Bacterial endocarditis -Antibiotics per ID team. Discussed. -Appreciate cardiothoracic surgery consultation, not a surgical candidate, also small aortic valve vegetation-risks greater than benefits of replacement.  Prolonged QT -Repeat QTC 467 ms. Reassuring.  We will sign off. Please call with any questions.  Dennison Mcdaid 10/06/2015, 9:32 AM

## 2015-10-06 NOTE — Progress Notes (Signed)
eLink Physician-Brief Progress Note Patient Name: Cassandra ChouSamantha XXXSpradley DOB: 04-Oct-1980 MRN: [id number]   Date of Service  10/06/2015  HPI/Events of Note  Notified of need for DVT prophylaxis.   eICU Interventions  Will order SCD's to bilateral lower extremities.      Intervention Category Intermediate Interventions: Best-practice therapies (e.g. DVT, beta blocker, etc.)  Sommer,Steven Eugene 10/06/2015, 10:28 PM

## 2015-10-06 NOTE — Progress Notes (Signed)
Inpatient Rehabilitation  Per PT request, patient was screened by Fae PippinMelissa Aylee Littrell for appropriateness for an Inpatient Acute Rehab consult.  At this time at unable to participate in OT/PT evals.  Will follow along and screen patient after evals have been completed. Please call with questions.    Charlane FerrettiMelissa Bentlie Catanzaro, M.A., CCC/SLP Admission Coordinator  [facility]  Cell [phone]

## 2015-10-06 NOTE — Progress Notes (Signed)
PT Cancellation Note  Patient Details Name: Cassandra ChouSamantha XXXSpradley MRN: [id number] DOB: [birth year]   Cancelled Treatment:    Reason Eval/Treat Not Completed: Medical issues which prohibited therapy (Safety concerns due to DT's and agitation.  Had Haldol.  )   Leigh Kaeding F 10/06/2015, 10:56 AM  Eber Jonesawn Rashay Barnette,PT Acute Rehabilitation [phone] (pager)
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
## 2015-10-06 NOTE — Procedures (Signed)
Intubation Procedure Note Cassandra ChouSamantha Wall [id number] 1980-08-20  Procedure: Intubation Indications: Respiratory insufficiency  Procedure Details Consent: Unable to obtain consent because of altered level of consciousness. Time Out: Verified patient identification, verified procedure, site/side was marked, verified correct patient position, special equipment/implants available, medications/allergies/relevent history reviewed, required imaging and test results available.  Performed  Maximum sterile technique was used including gloves, gown, hand hygiene and mask.  MAC and 3  Given 4 mg versed, 100 mcg fentanyl, 80 mg succ.  Inserted 7.5 cm ETT to 23 cm at lip using glidescope.  Evaluation Hemodynamic Status: BP stable throughout; O2 sats: stable throughout Patient's Current Condition: stable Complications: No apparent complications Patient did tolerate procedure well. Chest X-ray ordered to verify placement.  CXR: pending.   Cassandra HellingVineet Sura Canul, MD [facility] 10/06/2015, 7:54 PM Pager:  [phone] After 3pm call: [phone]

## 2015-10-06 NOTE — Progress Notes (Signed)
OT Cancellation Note  Patient Details Name: Cassandra ChouSamantha XXXSpradley MRN: [id number] DOB: [birth year]   Cancelled Treatment:    Reason Eval/Treat Not Completed: Patient not medically ready (agitated. Going for CT.)will check on pt tomorrow.  [facility] Center[facility],HILLARY  Victoriah Wilds, OTR/L  [phone] 10/06/2015 10/06/2015, 1:34 PM

## 2015-10-06 NOTE — Progress Notes (Signed)
Pulled patient's ETT tube out 2cm per MD order. Patient is now 22 at the lip. RT will continue to monitor.

## 2015-10-06 NOTE — Progress Notes (Signed)
PT Cancellation Note  Patient Details Name: Cassandra ChouSamantha XXXSpradley MRN: [id number] DOB: Oct 10, 1980   Cancelled Treatment:    Reason Eval/Treat Not Completed: Medical issues which prohibited therapy (Pt too agitated.  Going to CT scan as well per nurse.  )   Tawni MillersWhite, Emin Foree F 10/06/2015, 1:11 PM  [facility] Tahra Hitzeman,PT Acute Rehabilitation [phone] (pager)
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
## 2015-10-06 NOTE — Progress Notes (Signed)
CRITICAL VALUE ALERT  Critical value received:  PH (7.416), PC02 (17.9) (panic), Bicarb (11.3)  Date of notification:  1255  Time of notification:  10/06/2015  Critical value read back:Yes.    Nurse who received alert:  Lovie MacadamiaNadine Zamani Crocker   MD notified (1st page):  Dr Marvel PlanJindong XU (nurology notified at 1315) Dr Allena KatzPatel ([facility] notified at 1420)  Time of first page:  1315 MD notified (2nd page):  Time of second page:  Responding MD:  Dr Marvel PlanJindong XU  Time MD responded:  1320

## 2015-10-06 NOTE — Progress Notes (Signed)
PCCM ACUTE ASSESSMENT  Subjective: Increased WOB, progressive agitation with altered mental status.  Worsening hypoxia.  Vital signs: BP 125/94 mmHg  Pulse 96  Temp(Src) 98.8 F (37.1 C) (Axillary)  Resp 23  Ht  (1.727 m)  Wt 113 lb 9.6 oz (51.529 kg)  BMI 17.28 kg/m2  SpO2 100%  Intake/output: I/O last 3 completed shifts: In: 6145 [P.O.:1440; I.V.:4005; IV Piggyback:700] Out: 2675 [Urine:2675]  General: ill appearing, diaphoretic HEENT: pupils midpoint and reactive Cardiac: regular, tachycardic with 2/6 SM Chest: b/l crackles Abd: soft, mild epigastric tenderness Ext: no edema, decreased muscle bulk Skin: multiple skin lesions  Neuro: moving without purporse  CBC Recent Labs     10/04/15  1232  10/06/15  0832  10/06/15  1540  WBC  8.8  11.6*  9.7  HGB  8.3*  8.0*  7.1*  HCT  25.6*  24.3*  21.8*  PLT  69*  75*  60*    Coag's Recent Labs     10/04/15  0445  10/04/15  0941  10/06/15  0832  APTT  31  35   --   INR  1.31  1.39  1.29    BMET Recent Labs     10/04/15  1232  10/06/15  0832  10/06/15  1540  NA  134*  144  148*  K  4.0  3.4*  3.8  CL  106  122*  124*  CO2  16*  11*  13*  BUN  60*  60*  53*  CREATININE  3.88*  1.41*  1.28*  GLUCOSE  137*  126*  99    Electrolytes Recent Labs     10/04/15  1232  10/06/15  0832  10/06/15  1540  CALCIUM  7.0*  7.4*  7.8*  MG   --   2.6*  2.5*  PHOS   --    --   3.6    Sepsis Markers Recent Labs     10/04/15  1232  PROCALCITON  21.66    ABG Recent Labs     10/06/15  1255  PHART  7.416  PCO2ART  17.9*  PO2ART  107*    Liver Enzymes Recent Labs     10/04/15  1232  10/06/15  0832  10/06/15  1540  AST  62*  36  35  ALT  37  21  20  ALKPHOS  128*  102  96  BILITOT  1.2  0.4  0.6  ALBUMIN  2.1*  1.6*  1.6*    Cardiac Enzymes No results for input(s): TROPONINI, PROBNP in the last 72 hours.  Glucose Recent Labs     10/04/15  2039  10/05/15  1746  10/06/15  1645  GLUCAP   99  103*  90    Imaging Dg Chest 1 View  10/06/2015  CLINICAL DATA:  35 year old female, combative. Large right MCA territory infarct. Initial encounter. EXAM: CHEST 1 VIEW COMPARISON:  10/04/2015 and earlier. FINDINGS: Supine AP view of the chest at 1319 hours. Progressed and more confluent left mid lung opacity. Persistent patchy and nodular right lung opacity. Stable lung volumes. Mediastinal contours remain normal. No superimposed pneumothorax or pleural effusion. No acute pulmonary edema. IMPRESSION: 1. Progressed confluent left mid lung, and patchy and nodular a right lung opacity since 10/04/2015. 2. Top differential considerations in this setting include aspiration pneumonia, and disseminated infection from emboli or sepsis. 3. No pleural effusion identified. Electronically Signed   By: Odessa Fleming  M.D.   On: 10/06/2015 13:55   Dg Abd 1 View  10/06/2015  CLINICAL DATA:  35 year old female, combative. Large right MCA territory infarct. Initial encounter. EXAM: ABDOMEN - 1 VIEW COMPARISON:  CT Abdomen and Pelvis 03/16/2011. FINDINGS: Supine view of the abdomen at 1319 hours. Mild motion artifact. Non obstructed bowel gas pattern. Retained stool throughout the colon. No definite pneumoperitoneum on this supine view. No acute osseous abnormality identified; dextro convex curvature of the spine probably is positional. IMPRESSION: Non obstructed bowel gas pattern. Electronically Signed   By: Odessa FlemingH  Hall M.D.   On: 10/06/2015 13:52   Ct Head Wo Contrast  10/06/2015  CLINICAL DATA:  Continued surveillance cerebral infarction. Sepsis. Endocarditis. Gram-positive Cocci. EXAM: CT HEAD WITHOUT CONTRAST TECHNIQUE: Contiguous axial images were obtained from the base of the skull through the vertex without intravenous contrast. COMPARISON:  CT head 10/04/2015.  MR head 10/04/2015. FINDINGS: Evolving cytotoxic edema, RIGHT frontal and temporal lobe status post RIGHT MCA occlusion. Effacement of the sylvian fissure and  regional sulci without significant right-to-left shift. Smaller areas of hypoattenuation, BILATERAL occipital lobes, appears similar to 3/28 CT. See for instance image 18 series 3. Given their location at the gray-white junction, BILATERAL brain abscesses are not excluded. The intensity of restricted diffusion on prior MR is also more intense than that associated with the acute infarction. IMPRESSION: Evolving cytotoxic edema, RIGHT frontal and temporal lobe status post RIGHT MCA occlusion. No significant right-to-left shift or hemorrhagic transformation. BILATERAL occipital lobe hypodensities, subcentimeter size, could represent brain abscesses (versus acute subcortical infarcts). Consider continued surveillance with post infusion MRI. Electronically Signed   By: Elsie StainJohn T Curnes M.D.   On: 10/06/2015 14:13   [place] Renal  10/05/2015  CLINICAL DATA:  Acute kidney injury. EXAM: RENAL / URINARY TRACT ULTRASOUND COMPLETE COMPARISON:  None. FINDINGS: Right Kidney: Length: 12.9 cm. Increased parenchymal echogenicity. No mass or hydronephrosis visualized. Left Kidney: Length: 11.2 cm. Increased parenchymal echogenicity. No mass or hydronephrosis visualized. Bladder: Partially collapsed around a Foley catheter IMPRESSION: 1. No hydronephrosis. 2. Increased parenchymal echogenicity bilaterally compatible with chronic medical renal disease. Electronically Signed   By: Signa Kellaylor  Stroud M.D.   On: 10/05/2015 09:44   Assessment/plan:  Acute hypoxic respiratory failure. MSSA aortic/tricuspid valve endocarditis. Septic pulmonary embolism. Acute CVA likely from septic embolism. IV drug abuse. Acute metabolic encephalopathy. Tobacco abuse. - proceed with intubation - full vent support - f/u CXR, ABG - change to diprivan/fentanyl gtt for sedation and analgesia  - continue nafcillin per ID - not candidate for valvular surgery per TCTS at this time  CC time by me independent of APP time and procedure time is 38  minutes.  Coralyn HellingVineet Junie Avilla, MD [facility] 10/06/2015, 8:05 PM Pager:  [phone] After 3pm call: [phone]
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
## 2015-10-06 NOTE — Progress Notes (Signed)
10/06/2015 Patient pull out foley that was placed when she came in. Report to nurse receiving patient on 73M foley was out. New Foley was place before patient transfer to [facility]M. [facility] RN.

## 2015-10-06 NOTE — Procedures (Signed)
History: Cassandra Wall is an 35 y.o. female patient with right MCA stroke , altered mental status seizures. Routine inpatient EEG was performed for further evaluation.   Patient Active Problem List   Diagnosis Date Noted  . Pressure ulcer 10/06/2015  . Acute and subacute infective endocarditis in diseases classified elsewhere   . Acute respiratory failure with hypoxia ([place])   . Chest pain 10/05/2015  . Multifocal Pneumonia, due to septic emboli 10/05/2015  . Anemia 10/05/2015  . Stroke (cerebrum) ([place])   . Endocarditis 10/04/2015  . Sepsis ([place]) secondary to endocarditis in an IV drug abuser 10/04/2015  . Acute renal failure (ARF) ([place]) 10/04/2015  . Thrombocytopenia ([place]) 10/04/2015  . Septic embolism ([place]) 10/04/2015  . Acute embolic stroke ([place]) 93/26/7124  . Prolonged Q-T interval on ECG 10/04/2015  . Right forearm cellulitis 11/04/2014  . IV drug abuse 11/04/2014  . Transaminitis 11/04/2014  . Normocytic anemia 11/04/2014  . Endometriosis 02/02/2011  . 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  0.9 %  sodium chloride infusion, , Intravenous, Continuous, Lavina Hamman, MD, Last Rate: 75 mL/hr at 10/06/15 1800 .  0.9 %  sodium chloride infusion, , Intravenous, Once, [employer], MD, Last Rate: 10 mL/hr at 10/06/15 1702, 10 mL/hr at 10/06/15 1702 .  0.9 %  sodium chloride infusion, , Intravenous, Once, [employer], MD .  acetaminophen (TYLENOL) solution 650 mg, 650 mg, Oral, Q6H PRN, Chesley Mires, MD .  Derrill Memo ON 10/07/2015] antiseptic oral rinse solution (CORINZ), 7 mL, Mouth Rinse, QID, Corey Harold, NP .  chlorhexidine gluconate (SAGE KIT) (PERIDEX) 0.12 % solution 15 mL, 15 mL, Mouth Rinse, BID, Corey Harold, NP .  docusate (COLACE) 50 MG/5ML liquid 100 mg, 100 mg, Per Tube, BID PRN, Chesley Mires, MD .  feeding supplement (PRO-STAT SUGAR FREE 64) liquid 30 mL, 30 mL, Per  Tube, BID, Chesley Mires, MD .  feeding supplement (VITAL HIGH PROTEIN) liquid 1,000 mL, 1,000 mL, Per Tube, Q24H, Chesley Mires, MD .  fentaNYL (SUBLIMAZE) 100 MCG/2ML injection, , , ,  .  fentaNYL (SUBLIMAZE) 2,500 mcg in sodium chloride 0.9 % 250 mL (10 mcg/mL) infusion, 25-400 mcg/hr, Intravenous, Continuous, Corey Harold, NP .  fentaNYL (SUBLIMAZE) bolus via infusion 50 mcg, 50 mcg, Intravenous, Q1H PRN, Corey Harold, NP .  fentaNYL (SUBLIMAZE) injection 50 mcg, 50 mcg, Intravenous, Once, Corey Harold, NP .  midazolam (VERSED) 2 MG/2ML injection, , , ,  .  nafcillin 2 g in dextrose 5 % 100 mL IVPB, 2 g, Intravenous, 6 times per day, Thayer Headings, MD, 2 g at 10/06/15 1600 .  [DISCONTINUED] ondansetron (ZOFRAN) tablet 4 mg, 4 mg, Oral, Q6H PRN **OR** ondansetron (ZOFRAN) injection 4 mg, 4 mg, Intravenous, Q6H PRN, Venetia Maxon Rama, MD .  pantoprazole sodium (PROTONIX) 40 mg/20 mL oral suspension 40 mg, 40 mg, Per Tube, Q24H, Vineet Sood, MD .  propofol (DIPRIVAN) 1000 MG/100ML infusion, , , ,  .  propofol (DIPRIVAN) 1000 MG/100ML infusion, 0-50 mcg/kg/min, Intravenous, Continuous, Corey Harold, NP .  sodium bicarbonate 100 mEq in dextrose 5 % 1,000 mL infusion, , Intravenous, Continuous, Jose Shirl Harris, MD   Introduction:  This is a 19 channel routine scalp EEG performed at the bedside with bipolar and monopolar montages arranged in accordance to the international 10/20 system of electrode placement. One channel was dedicated to EKG  recording.   Findings:  Mild generalized background slowing of about 7 Hz noted, with superimposed focal slowing in the right posterior frontal parietal region. No definite evidence of abnormal epileptiform discharges or electrographic seizures were noted during this recording.   Impression:  Abnormal awake and drowsy routine inpatient EEG suggestive of mild generalized encephalopathy with superimposed focal neuronal dysfunction in the right  frontocentral parietal region, expected from the known right MCA infarct. Clinical correlation is recommended .

## 2015-10-06 NOTE — Progress Notes (Signed)
eLink Physician-Brief Progress Note Patient Name: Cassandra ChouSamantha Wall DOB: 1980/10/30 MRN: [id number]   Date of Service  10/06/2015  HPI/Events of Note  Review of CXR reveals ETT to be at the level of the carina.   eICU Interventions  Will order: 1. Pull ETT back by 2 cm and re-secure.  2. Repeat portable CXR after repositioning the ETT.      Intervention Category Intermediate Interventions: Diagnostic test evaluation  Lenell AntuSommer,Steven Eugene 10/06/2015, 8:46 PM

## 2015-10-06 NOTE — Progress Notes (Signed)
STROKE TEAM PROGRESS NOTE   SUBJECTIVE (INTERVAL HISTORY) Her RN is at the bedside. Pt is restless, moaning with rapid breathing in bed. She has two-point restrain currently. Connected pulse oximetry and showing 98-100%. When ask if there is any pain, she point to her right lower abdomen. Not cooperative on exam and did not answer questions. Had stat CT and showed right MCA infarct but no significant midline shift. ABG showed hyperventilation with compensated respiratory alkalosis. CXR and abd X-ray are pending.   OBJECTIVE Temp:  [97.6 F (36.4 C)-98.8 F (37.1 C)] 98.8 F (37.1 C) (03/30 1445) Pulse Rate:  [72-106] 93 (03/30 1445) Cardiac Rhythm:  [-] Normal sinus rhythm (03/30 0830) Resp:  [17-36] 19 (03/30 1445) BP: (115-145)/(61-85) 132/72 mmHg (03/30 1445) SpO2:  [94 %-100 %] 99 % (03/30 1445)  CBC:   Recent Labs Lab 10/04/15 0445  10/04/15 1232 10/06/15 0832  WBC 11.8*  --  8.8 11.6*  NEUTROABS 10.2*  --   --  9.8*  HGB 8.6*  < > 8.3* 8.0*  HCT 26.6*  < > 25.6* 24.3*  MCV 79.4  --  81.5 79.9  PLT 86*  < > 69* 75*  < > = values in this interval not displayed.  Basic Metabolic Panel:   Recent Labs Lab 10/04/15 1232 10/06/15 0832  NA 134* 144  K 4.0 3.4*  CL 106 122*  CO2 16* 11*  GLUCOSE 137* 126*  BUN 60* 60*  CREATININE 3.88* 1.41*  CALCIUM 7.0* 7.4*  MG  --  2.6*    Lipid Panel: No results found for: CHOL, TRIG, HDL, CHOLHDL, VLDL, LDLCALC HgbA1c: No results found for: HGBA1C Urine Drug Screen:     Component Value Date/Time   LABOPIA POSITIVE* 10/04/2015 0908   COCAINSCRNUR POSITIVE* 10/04/2015 0908   LABBENZ NONE DETECTED 10/04/2015 0908   AMPHETMU NONE DETECTED 10/04/2015 0908   THCU NONE DETECTED 10/04/2015 0908   LABBARB NONE DETECTED 10/04/2015 0908      IMAGING I have personally reviewed the radiological images below and agree with the radiology interpretations.  Ct Head Wo Contrast  10/04/2015  IMPRESSION: Suggestion of decreased  attenuation involving the right external capsule and right temporal lobe, which may reflect an evolving acute right-sided infarct. No significant mass effect or midline shift seen.   Mr Brain Wo Contrast  10/04/2015  IMPRESSION: 1. Acute/subacute large right MCA territory nonhemorrhagic infarct. 2. Additional foci of acute nonhemorrhagic infarct involves the right occipital lobe, left occipital parietal lobe, in the left cerebellum. 3. No other significant white matter disease or evidence for chronic abnormality is present.   Dg Chest Port 1 View  10/04/2015  IMPRESSION: Patchy bilateral airspace opacities raise concern for multifocal pneumonia.   2D Echocardiogram  - Left ventricle: The cavity size was normal. Systolic function was normal. The estimated ejection fraction was in the range of 50% to 55%. Wall motion was normal; there were no regional wall motion abnormalities. Left ventricular diastolic function parameters were normal. - Aortic valve: There was a vegetation measuring 0.66 cm x 0.52 cm on the noncoronary cusp. Transvalvular velocity was within the normal range. There was no stenosis. There was no regurgitation. - Mitral valve: There was trivial regurgitation. - Right ventricle: The cavity size was normal. Wall thickness was normal. Systolic function was normal. - Atrial septum: No defect or patent foramen ovale was identified by color flow Doppler. - Tricuspid valve: There was a mobile, 0.9 cm (W) x 1.3 cm (L) vegetation on the  atrial side of the septal leaflet. There was also a 2.6 cm x 0.8 cm mobile vegetation on the ventricular side of the anterior leaflet. There was moderate regurgitation. - Pulmonary arteries: Systolic pressure was within the normal range. PA peak pressure: 25 mm Hg (S). - Pericardium, extracardiac: A small pericardial effusion was identified. 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  MRA pending  EEG  Abnormal awake and drowsy routine inpatient EEG suggestive of mild generalized encephalopathy with superimposed focal neuronal dysfunction in the right frontocentral parietal region, expected from the known right MCA infarct. Clinical correlation is recommended .    PHYSICAL EXAM  Temp:  [97.6 F (36.4 C)-98.8 F (37.1 C)] 98.8 F (37.1 C) (03/30 1445) Pulse Rate:  [72-106] 93 (03/30 1445) Resp:  [17-36] 19 (03/30 1445) BP: (115-145)/(61-85) 132/72 mmHg (03/30 1445) SpO2:  [94 %-100 %] 99 % (03/30 1445)  General - thin built, well developed, agitated and restless.  Ophthalmologic - Fundi not visualized due to noncooperation.  Cardiovascular - Regular rate and rhythm.  Neuro - agitated and restless, moaning, open eyes on voice, not following commands, did not answer simple questions. Inconsistent eye blinking with visual threat due to incooperation. PERRL, extraocular movements not cooperative on exam, left ptosis preference but able to open up. Left facial droop with decreased forehead wrinkles. Spontaneously moving RUE and RLE strong at least 4+/5, less movement LLE and LUE 3/5 on pain stimulatoin. DTRs were 1+ in all extremities and she had no pathological reflexes. Sensation, coordination and gait not tested.    ASSESSMENT/PLAN [name] is a 35 y.o. female with history of polysubstance abuse, smoker, hepC positive, CKD, seizure, and anxiety presenting with left sided weakness. She did not receive IV t-PA due to delay in arrival.   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  lovenox  for VTE prophylaxis DIET - DYS 1 Room service appropriate?: Yes; Fluid consistency:: Nectar Thick  No antithrombotic prior to admission, now on No antithrombotic. No antithrombotic indicated for endocarditis.  Therapy recommendations:  pending  Disposition:  pending  Endocarditis with sepsis  ID on board  BCx positive for MSSA  On nafcillin  Cardiology signed off  CVTS does not think pt needs surgical intervention and she is not a good candidate for surgery anyway  Respiratory distress  Hyperventilation  Good O2 sat  ABG showed compensated respiratory alkalosis   CXR pending  May consider CCM consult if worsening  IVDA   Likely the cause of septic emboli  Focal cellulitis  Hx of IVDA with brother who deceased of IVDA induced complications  Hx heroin and cocaine abuse.   UDS positive for opiates and cocaine this admission  Tobacco abuse  Current smoker  Smoking cessation counseling provided  Other Stroke Risk Factors  Occasional ETOH use  Migraines  Other Active Problems  Hx psychosis  Hx seizures  Acute renal failure Cr 4.5 -> 3.88 ->1.41 -> 1.28  Thrombocytopenia - Hgb 8.6 -> 9.0 -> 7.1  Prolonged Q-T interval  hypothermia  Hospital day # 2  This patient  is critically ill due to large right MCA infarct, sepsis with endocarditis, respiratory distress, AKI, anemia and at significant risk of neurological worsening, death form recurrent infarct, hemorrhagic transformation, septic shock, renal failure and respiratory failure. This patient's care requires constant monitoring of vital signs, hemodynamics, respiratory and cardiac monitoring, review of multiple databases, neurological assessment, discussion with family, other specialists and medical decision making of high complexity. I spent 35 minutes of neurocritical care time in the care of this patient.  Marvel Plan, MD PhD Stroke Neurology 10/06/2015 3:22 PM    To contact Stroke Continuity  provider, please refer to [email or web address]. After hours, contact General Neurology

## 2015-10-06 NOTE — Progress Notes (Signed)
OT Cancellation Note  Patient Details Name: Cassandra Wall MRN: [id number] DOB: March 29, 1981   Cancelled Treatment:    Reason Eval/Treat Not Completed: Medical issues which prohibited therapy. Per nsg, DTs and agitation requiring Haldol. Will check back this pm if appropriate.   [facility] LPWARD,HILLARY  Odalys Win, OTR/L  [phone] 10/06/2015 10/06/2015, 11:33 AM

## 2015-10-06 NOTE — Progress Notes (Addendum)
Pharmacy Antibiotic Note  Cassandra Wall is a 35 y.o. female admitted on 10/04/2015 with 2 valve MSSA endocarditis.  Pharmacy has been consulted for ancef dosing. Creat 3.88>>1.41. AF. ID changed ancef to nafcillin for MSSA.   Plan:  DC ancef, nafcillin 2 gm q4h for MSSA endocardiits Pharmacy to sign off  Height: 5\' 8"  (172.7 cm) Weight: 113 lb 9.6 oz (51.529 kg) IBW/kg (Calculated) : 63.9  Temp (24hrs), Avg:97.7 F (36.5 C), Min:97 F (36.1 C), Max:98.3 F (36.8 C)   Recent Labs Lab 10/04/15 0445 10/04/15 0459 10/04/15 0500 10/04/15 1232 10/06/15 0832  WBC 11.8*  --   --  8.8 11.6*  CREATININE 4.48*  --  4.50* 3.88* 1.41*  LATICACIDVEN  --  3.11*  --  0.9  --   VANCORANDOM  --   --   --   --  10    Estimated Creatinine Clearance: 46.1 mL/min (by C-G formula based on Cr of 1.41).    Allergies  Allergen Reactions  . Wheat Anaphylaxis  . Ciprofloxacin Hcl     Reports as resistant   . Latex Hives, Itching and Rash    Antimicrobials this admission:  3/28 Vanc>>3/30 3/28 Rocephin x 1 dose  3/28 Ampicillinx 1 dose 3/28 Cefepime >>3/29 3/29 Cefazolin >>3/30 3/30 Nafcillin>>  Dose adjustments this admission: 3/30 ancef increased from q12 to q8h interval 3/30 0830 VR = 10 mcg/ml  Microbiology results:  3/28 BCx: MSSA 3/28 CSF Cx: NG < 24h 3/28 MRSA PCR neg 3/30 BCx2>>   Thank you for allowing pharmacy to be a part of this patient's care.  Herby AbrahamMichelle T. Helon Wisinski, Pharm.D. [phone] 10/06/2015 10:22 AM

## 2015-10-06 NOTE — Progress Notes (Signed)
Speech Language Pathology Treatment: Dysphagia  Patient Details Name: Cassandra Wall MRN: [id number] DOB: Jan 12, 1981 Today's Date: 10/06/2015 Time: [id number] SLP Time Calculation (min) (ACUTE ONLY): 11 min  Assessment / Plan / Recommendation Clinical Impression  Pt writhing in bed and c/o back and shoulder pain, diaphoretic with increased dysarthria upon SLP arrival. RN report, pt likely going through illicit drug withdrawal at this time. Pt exhibited reduced labial seal resulting in anterior spillage with all consistencies. Immediate and delayed coughing witnessed during intake, concerning for possible penetration/aspiration which was not present yesterday with nectar thick liquids. Pt at severe aspiration risk at present due to distress and internal distraction. Recommend pt defer eating/drinking until attention, awareness and alertness improves (per RN clinical judgment). SLP will f/u to determine diet tolerance   HPI HPI: 35 y.o. female with h/o seizures, polysubstance abuse (IV heroin and cocaine) and tobacco abuse who presented to ED with L side weakness. Mother endorsed pt has had psychosis in past. 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 Additional foci of acute nonhemorrhagic infarct involves R occipital lobe, L occipital parietal lobe, in L cerebellum. CXR 3/28 patchy bilateral airspace opacities raise concern for multifocal pneumonia.      SLP Plan  Continue with current plan of care     Recommendations  Diet recommendations: Dysphagia 1 (puree);Nectar-thick liquid Medication Administration: Via alternative means Supervision: Full supervision/cueing for compensatory strategies;Staff to assist with self feeding Compensations: Minimize environmental distractions;Slow rate;Small sips/bites;Monitor for anterior loss Postural Changes and/or Swallow Maneuvers: Seated upright 90 degrees             Oral Care Recommendations: Oral care QID Follow  up Recommendations:  (TBD) Plan: Continue with current plan of care                     Royce MacadamiaLitaker, Brezlyn Manrique Willis 10/06/2015, 10:46 AM   Breck CoonsLisa Willis Lonell FaceLitaker M.Ed [employer]-SLP Pager [phone]

## 2015-10-06 NOTE — Progress Notes (Signed)
[facility]   Reason for visit: Follow up on two valve endocarditis  Interval History: now with MSSA bacteremia.  PT noted some deficits.  Afebrile.    Physical Exam: Constitutional:  Filed Vitals:   10/06/15 0404 10/06/15 0700  BP: 127/85   Pulse: 106 96  Temp: 98.2 F (36.8 C) 98.3 F (36.8 C)  Resp: 18 36   patient appears agitated.   Respiratory: Normal respiratory effort; CTA B Cardiovascular: Tachy RR  Review of Systems: Unable to be assessed due to mental status  Lab Results  Component Value Date   WBC 11.6* 10/06/2015   HGB 8.0* 10/06/2015   HCT 24.3* 10/06/2015   MCV 79.9 10/06/2015   PLT PENDING 10/06/2015    Lab Results  Component Value Date   CREATININE 3.88* 10/04/2015   BUN 60* 10/04/2015   NA 134* 10/04/2015   K 4.0 10/04/2015   CL 106 10/04/2015   CO2 16* 10/04/2015    Lab Results  Component Value Date   ALT 37 10/04/2015   AST 62* 10/04/2015   ALKPHOS 128* 10/04/2015     Microbiology: Recent Results (from the past 240 hour(s))  Culture, blood (single)     Status: None   Collection Time: 10/04/15  4:46 AM  Result Value Ref Range Status   Specimen Description BLOOD LEFT HIP  Final   Special Requests IN PEDIATRIC BOTTLE 5CC  Final   Culture  Setup Time   Final    GRAM POSITIVE COCCI IN CLUSTERS AEROBIC BOTTLE ONLY CRITICAL RESULT CALLED TO, READ BACK BY AND VERIFIED WITHHonor Loh RN 1706 10/04/15 A BROWNING    Culture   Final    STAPHYLOCOCCUS AUREUS Performed at [facility]    Report Status 10/06/2015 FINAL  Final   Organism ID, Bacteria STAPHYLOCOCCUS AUREUS  Final      Susceptibility   Staphylococcus aureus - MIC*    CIPROFLOXACIN <=0.5 SENSITIVE Sensitive     ERYTHROMYCIN <=0.25 SENSITIVE Sensitive     GENTAMICIN <=0.5 SENSITIVE Sensitive     OXACILLIN <=0.25 SENSITIVE Sensitive     TETRACYCLINE <=1 SENSITIVE Sensitive     VANCOMYCIN 1 SENSITIVE Sensitive     TRIMETH/SULFA <=10 SENSITIVE  Sensitive     CLINDAMYCIN <=0.25 SENSITIVE Sensitive     RIFAMPIN <=0.5 SENSITIVE Sensitive     Inducible Clindamycin NEGATIVE Sensitive     * STAPHYLOCOCCUS AUREUS  CSF culture     Status: None (Preliminary result)   Collection Time: 10/04/15  6:00 AM  Result Value Ref Range Status   Specimen Description CSF  Final   Special Requests Normal  Final   Gram Stain   Final    CYTOSPIN WBC PRESENT,BOTH PMN AND MONONUCLEAR NO ORGANISMS SEEN Gram Stain Report Called to,Read Back By and Verified With: A. DENNIS RN AT 0700 ON 03.28.17 BY SHUEA    Culture   Final    NO GROWTH < 24 HOURS Performed at [facility]    Report Status PENDING  Incomplete  MRSA PCR Screening     Status: None   Collection Time: 10/04/15 10:52 PM  Result Value Ref Range Status   MRSA by PCR NEGATIVE NEGATIVE Final    Comment:        The GeneXpert MRSA Assay (FDA approved for NASAL specimens only), is one component of a comprehensive MRSA colonization surveillance program. It is not intended to diagnose MRSA infection nor to guide or monitor treatment for  MRSA infections.     Impression/Plan:  1. Two valve endocarditis - MSSA.  On cefazolin and vancomycin stopped.  Will need a prolonged course.   Appreciate Dr. Donata ClayVan Trigt and Dr. Anne FuSkains input.   2. Drug abuse - appears to be withdrawing, may need opiod bridge, will defer to primary hospitalist.   3.  CVA - likely from #1.  Likely will need rehab.

## 2015-10-06 NOTE — Progress Notes (Signed)
Initial Nutrition Assessment  DOCUMENTATION CODES:   Underweight  INTERVENTION:   Magic cup TID with meals, each supplement provides 290 kcal and 9 grams of protein  NUTRITION DIAGNOSIS:   Increased nutrient needs related to wound healing, acute illness as evidenced by estimated needs  GOAL:   Patient will meet greater than or equal to 90% of their needs  MONITOR:   PO intake, Supplement acceptance, Labs, Weight trends, Skin, I & O's  REASON FOR ASSESSMENT:   Low Braden  ASSESSMENT:   35 y.o. Female with h/o seizures, polysubstance abuse (IV heroin and cocaine) and tobacco abuse who presented to ED with L side weakness. Mother endorsed pt has had psychosis in past. 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 Additional foci of acute nonhemorrhagic infarct involves R occipital lobe, L occipital parietal lobe, in L cerebellum. CXR 3/28 patchy bilateral airspace opacities raise concern for multifocal pneumonia.  RD unable to obtain nutrition hx.  Pt agitated and flailing legs in air. S/p bedside swallow evaluation 3/29.  SLP recommending Dys 1-Nectar thick liquids. Pt with deep tissue injury (DTI) to R heel. Nutrient needs increased.  RD to add oral nutrition supplements. Unable to complete Nutrition Focused Physical Exam at this time.  RD suspects malnutrition, however, unable to identify at this time.  Diet Order:  DIET - DYS 1 Room service appropriate?: Yes; Fluid consistency:: Nectar Thick  Skin:  Wound (see comment) (DTI to R heel)  Last BM:  3/30  Height:   Ht Readings from Last 1 Encounters:  10/04/15 5\' 8"  (1.727 m)    Weight:   Wt Readings from Last 1 Encounters:  10/04/15 113 lb 9.6 oz (51.529 kg)    Ideal Body Weight:  63.6 kg  BMI:  Body mass index is 17.28 kg/(m^2).  Estimated Nutritional Needs:   Kcal:  1700-1900  Protein:  80-90 gm  Fluid:  1.7-1.9 L  EDUCATION NEEDS:   No education needs identified at  this time  Maureen ChattersKatie Teiana Hajduk, RD, LDN Pager #: [phone] After-Hours Pager #: [phone]

## 2015-10-06 NOTE — Procedures (Signed)
Central Venous Catheter Insertion Procedure Note Cassandra ChouSamantha Wall [id number] 17-Nov-1980  Procedure: Insertion of Central Venous Catheter Indications: Assessment of intravascular volume  Procedure Details Consent: Unable to obtain consent because of emergent medical necessity. Time Out: Verified patient identification, verified procedure, site/side was marked, verified correct patient position, special equipment/implants available, medications/allergies/relevent history reviewed, required imaging and test results available.  Performed  Maximum sterile technique was used including antiseptics, cap, gloves, gown, hand hygiene, mask and sheet. Skin prep: Chlorhexidine; local anesthetic administered A antimicrobial bonded/coated triple lumen catheter was placed in the left internal jugular vein using the Seldinger technique. Ultrasound guidance used.Yes.   Catheter placed to 20 cm. Blood aspirated via all 3 ports and then flushed x 3. Line sutured x 2 and dressing applied.  Evaluation Blood flow good Complications: No apparent complications Patient did tolerate procedure well. Chest X-ray ordered to verify placement.  CXR: pending.  Joneen RoachPaul Gunner Iodice, AGACNP-BC [facility] Pager [phone] or [phone]  10/06/2015 8:16 PM

## 2015-10-07 ENCOUNTER — Inpatient Hospital Stay (HOSPITAL_COMMUNITY): Payer: Medicaid Other

## 2015-10-07 DIAGNOSIS — I339 Acute and subacute endocarditis, unspecified: Secondary | ICD-10-CM

## 2015-10-07 DIAGNOSIS — N170 Acute kidney failure with tubular necrosis: Secondary | ICD-10-CM

## 2015-10-07 DIAGNOSIS — R400 Somnolence: Secondary | ICD-10-CM

## 2015-10-07 DIAGNOSIS — I6340 Cerebral infarction due to embolism of unspecified cerebral artery: Secondary | ICD-10-CM

## 2015-10-07 DIAGNOSIS — N179 Acute kidney failure, unspecified: Secondary | ICD-10-CM | POA: Insufficient documentation

## 2015-10-07 DIAGNOSIS — A419 Sepsis, unspecified organism: Secondary | ICD-10-CM

## 2015-10-07 LAB — CBC WITH DIFFERENTIAL/PLATELET
BASOS PCT: 0 %
Basophils Absolute: 0 10*3/uL (ref 0.0–0.1)
EOS ABS: 0 10*3/uL (ref 0.0–0.7)
Eosinophils Relative: 0 %
HCT: 23.6 % — ABNORMAL LOW (ref 36.0–46.0)
HEMOGLOBIN: 7.4 g/dL — AB (ref 12.0–15.0)
LYMPHS ABS: 0.9 10*3/uL (ref 0.7–4.0)
LYMPHS PCT: 10 %
MCH: 25.3 pg — AB (ref 26.0–34.0)
MCHC: 31.4 g/dL (ref 30.0–36.0)
MCV: 80.5 fL (ref 78.0–100.0)
Monocytes Absolute: 0.3 10*3/uL (ref 0.1–1.0)
Monocytes Relative: 3 %
NEUTROS ABS: 8.1 10*3/uL — AB (ref 1.7–7.7)
Neutrophils Relative %: 87 %
Platelets: 63 10*3/uL — ABNORMAL LOW (ref 150–400)
RBC: 2.93 MIL/uL — ABNORMAL LOW (ref 3.87–5.11)
RDW: 15.3 % (ref 11.5–15.5)
WBC: 9.3 10*3/uL (ref 4.0–10.5)

## 2015-10-07 LAB — COMPREHENSIVE METABOLIC PANEL
ALK PHOS: 90 U/L (ref 38–126)
ALT: 16 U/L (ref 14–54)
ANION GAP: 7 (ref 5–15)
AST: 27 U/L (ref 15–41)
Albumin: 1.6 g/dL — ABNORMAL LOW (ref 3.5–5.0)
BILIRUBIN TOTAL: 1.3 mg/dL — AB (ref 0.3–1.2)
BUN: 48 mg/dL — AB (ref 6–20)
CALCIUM: 7.8 mg/dL — AB (ref 8.9–10.3)
CO2: 16 mmol/L — AB (ref 22–32)
CREATININE: 1.04 mg/dL — AB (ref 0.44–1.00)
Chloride: 122 mmol/L — ABNORMAL HIGH (ref 101–111)
GFR calc Af Amer: >60 mL/min (ref >60)
GFR calc non Af Amer: >60 mL/min (ref >60)
GLUCOSE: 118 mg/dL — AB (ref 65–99)
Potassium: 3.9 mmol/L (ref 3.5–5.1)
SODIUM: 145 mmol/L (ref 135–145)
TOTAL PROTEIN: 5.2 g/dL — AB (ref 6.5–8.1)

## 2015-10-07 LAB — POCT I-STAT 3, ART BLOOD GAS (G3+)
Acid-base deficit: 8 mmol/L — ABNORMAL HIGH (ref 0.0–2.0)
BICARBONATE: 16.7 meq/L — AB (ref 20.0–24.0)
O2 SAT: 96 %
PCO2 ART: 29.3 mmHg — AB (ref 35.0–45.0)
PO2 ART: 82 mmHg (ref 80.0–100.0)
Patient temperature: 98.6
TCO2: 18 mmol/L (ref 0–100)
pH, Arterial: 7.364 (ref 7.350–7.450)

## 2015-10-07 LAB — PROTIME-INR
INR: 1.45 (ref 0.00–1.49)
Prothrombin Time: 17.7 seconds — ABNORMAL HIGH (ref 11.6–15.2)

## 2015-10-07 LAB — CSF CULTURE W GRAM STAIN: Culture: NO GROWTH

## 2015-10-07 LAB — CSF CULTURE: SPECIAL REQUESTS: NORMAL

## 2015-10-07 LAB — MAGNESIUM
MAGNESIUM: 2.4 mg/dL (ref 1.7–2.4)
MAGNESIUM: 2.5 mg/dL — AB (ref 1.7–2.4)
Magnesium: 2.6 mg/dL — ABNORMAL HIGH (ref 1.7–2.4)

## 2015-10-07 LAB — GLUCOSE, CAPILLARY
Glucose-Capillary: 88 mg/dL (ref 65–99)
Glucose-Capillary: 112 mg/dL — ABNORMAL HIGH (ref 65–99)
Glucose-Capillary: 113 mg/dL — ABNORMAL HIGH (ref 65–99)
Glucose-Capillary: 127 mg/dL — ABNORMAL HIGH (ref 65–99)

## 2015-10-07 LAB — PHOSPHORUS
PHOSPHORUS: 4.5 mg/dL (ref 2.5–4.6)
Phosphorus: 4.4 mg/dL (ref 2.5–4.6)
Phosphorus: 4.6 mg/dL (ref 2.5–4.6)

## 2015-10-07 LAB — TRIGLYCERIDES: Triglycerides: 204 mg/dL — ABNORMAL HIGH (ref <150)

## 2015-10-07 MED ORDER — SODIUM CHLORIDE 0.9 % IV SOLN
INTRAVENOUS | Status: DC
  Administered 2015-10-22: 10 mL/h via INTRAVENOUS
  Administered 2015-10-25: 21:00:00 via INTRAVENOUS

## 2015-10-07 MED ORDER — JEVITY 1.2 CAL PO LIQD
1000.0000 mL | ORAL | Status: DC
  Filled 2015-10-07 (×2): qty 1000

## 2015-10-07 MED ORDER — MIDAZOLAM HCL 2 MG/2ML IJ SOLN
2.0000 mg | Freq: Once | INTRAMUSCULAR | Status: AC
  Administered 2015-10-08: 2 mg via INTRAVENOUS
  Filled 2015-10-07: qty 2

## 2015-10-07 MED ORDER — VITAL AF 1.2 CAL PO LIQD
1000.0000 mL | ORAL | Status: DC
Start: 2015-10-07 — End: 2015-10-13
  Administered 2015-10-07 – 2015-10-12 (×6): 1000 mL
  Filled 2015-10-07 (×3): qty 1000

## 2015-10-07 MED ORDER — MIDAZOLAM HCL 2 MG/2ML IJ SOLN
INTRAMUSCULAR | Status: AC
  Administered 2015-10-07: 2 mg via INTRAVENOUS
  Filled 2015-10-07: qty 2

## 2015-10-07 MED ORDER — PRO-STAT SUGAR FREE PO LIQD
30.0000 mL | Freq: Every day | ORAL | Status: DC
  Administered 2015-10-08 – 2015-10-12 (×5): 30 mL
  Filled 2015-10-07 (×12): qty 30

## 2015-10-07 MED ORDER — MIDAZOLAM HCL 2 MG/2ML IJ SOLN
2.0000 mg | Freq: Once | INTRAMUSCULAR | Status: AC
  Administered 2015-10-07: 2 mg via INTRAVENOUS

## 2015-10-07 NOTE — Progress Notes (Signed)
CRITICAL VALUE ALERT  Critical value received:  Blood culture Gram positive cocci in clusters in Aerobic bottle   Date of notification:  10/07/2015  Time of notification:  2:48 AM  Critical value read back:Yes.    Nurse who received alert:  Carlyon ProwsShakiera Romelo Sciandra RN   MD notified (1st page):  Dr. Bea LauraE. Deterding  Time of first page:  2.49  MD notified (2nd page):  Time of second page:  Responding MD:  Dr Bea LauraE Deterding  Time MD responded:  2:49 AM

## 2015-10-07 NOTE — Progress Notes (Signed)
eLink Physician-Brief Progress Note Patient Name: Cassandra ChouSamantha XXXSpradley DOB: 1980-12-06 MRN: [id number]   Date of Service  10/07/2015  HPI/Events of Note  Nurse requests Versed 2 mg IV if needed in MRI.   eICU Interventions  Will order Versed 2 mg IV if needed in MRI.      Intervention Category Minor Interventions: Agitation / anxiety - evaluation and management  Lenell AntuSommer,Lemonte Al Eugene 10/07/2015, 8:51 PM

## 2015-10-07 NOTE — Progress Notes (Signed)
Nutrition Follow-up  DOCUMENTATION CODES:   Underweight  INTERVENTION:  -Provide Vital AF 1.2 via OG tube @ 40 ml/hr (960 ml/day)  -Provide 30 ml Prostat daily  -Propofol @ 15.5 mL/h provides 409 kcal  TF regimen + Propofol provides 1661 kcals, 87 grams of protein, 779 ml of free water  NUTRITION DIAGNOSIS:   Inadequate oral intake related to inability to eat as evidenced by NPO status.  GOAL:   Patient will meet greater than or equal to 90% of their needs  -Progressing   MONITOR:   TF tolerance, Labs, Vent status, Weight trends   ASSESSMENT:   35 y.o. Female with h/o seizures, polysubstance abuse (IV heroin and cocaine) and tobacco abuse who presented to ED with L side weakness. Mother endorsed pt has had psychosis in past. 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 Additional foci of acute nonhemorrhagic infarct involves R occipital lobe, L occipital parietal lobe, in L cerebellum. CXR 3/28 patchy bilateral airspace opacities raise concern for multifocal pneumonia  3/30-Transferred to ICU, Intubated, OG tube placed.   MV: 11.8 L/min Temp (24hrs), Avg:98.3 F (36.8 C), Min:97.7 F (36.5 C), Max:98.8 F (37.1 C) Propofol: 15.5 mL/h (409 kcal)   At visit pt receiving Vital High Protein @ 40 ml/hr, provides 960 kcal, 84 grams of protein, 803 ml of free water.   No family present to obtain nutrition or weight history.    Medications reviewed. Labs reviewed.  Diet Order:  Diet NPO time specified  Skin:  Wound (see comment) (DTI to R heel)  Last BM:  10/06/2015  Height:   Ht Readings from Last 1 Encounters:  10/04/15 5\' 8"  (1.727 m)    Weight:   Wt Readings from Last 1 Encounters:  10/07/15 116 lb 2.9 oz (52.7 kg)    Ideal Body Weight:  63.6 kg  BMI:  Body mass index is 17.67 kg/(m^2).  Estimated Nutritional Needs:   Kcal:  1581  Protein:  >/= 80 grams (1.5g/kg)   Fluid:  1.5 L  EDUCATION NEEDS:   No education  needs identified at this time  SwedenBrittany Sontee Desena, Dietetic Intern Pager: [phone]

## 2015-10-07 NOTE — Progress Notes (Addendum)
[facility]   Reason for visit: Follow up on MSSA bacteremia  Interval History: on nafcillin for MSSA with septic emboli to brain and lungs.  Now intubated due to agitation, hypoxia, increased WOB.     CXR independently reviewed and lesions do appear more cavitary as would be expected.    Physical Exam: Constitutional:  Filed Vitals:   10/07/15 0800 10/07/15 0900  BP: 103/63 111/68  Pulse: 64 77  Temp:    Resp: 24 24   intubated, sedated Eyes: anicteric HENT: no thrush Respiratory: on vent, no crackles Cardiovascular: RRR GI: soft, nt, nd  Review of Systems: Unable to be assessed due to mental status  Lab Results  Component Value Date   WBC 9.3 10/07/2015   HGB 7.4* 10/07/2015   HCT 23.6* 10/07/2015   MCV 80.5 10/07/2015   PLT PENDING 10/07/2015    Lab Results  Component Value Date   CREATININE 1.04* 10/07/2015   BUN 48* 10/07/2015   NA 145 10/07/2015   K 3.9 10/07/2015   CL 122* 10/07/2015   CO2 16* 10/07/2015    Lab Results  Component Value Date   ALT 16 10/07/2015   AST 27 10/07/2015   ALKPHOS 90 10/07/2015     Microbiology: Recent Results (from the past 240 hour(s))  Culture, blood (single)     Status: None   Collection Time: 10/04/15  4:46 AM  Result Value Ref Range Status   Specimen Description BLOOD LEFT HIP  Final   Special Requests IN PEDIATRIC BOTTLE 5CC  Final   Culture  Setup Time   Final    GRAM POSITIVE COCCI IN CLUSTERS AEROBIC BOTTLE ONLY CRITICAL RESULT CALLED TO, READ BACK BY AND VERIFIED WITHHonor Loh RN 1706 10/04/15 A BROWNING    Culture   Final    STAPHYLOCOCCUS AUREUS Performed at [facility]    Report Status 10/06/2015 FINAL  Final   Organism ID, Bacteria STAPHYLOCOCCUS AUREUS  Final      Susceptibility   Staphylococcus aureus - MIC*    CIPROFLOXACIN <=0.5 SENSITIVE Sensitive     ERYTHROMYCIN <=0.25 SENSITIVE Sensitive     GENTAMICIN <=0.5 SENSITIVE Sensitive     OXACILLIN <=0.25  SENSITIVE Sensitive     TETRACYCLINE <=1 SENSITIVE Sensitive     VANCOMYCIN 1 SENSITIVE Sensitive     TRIMETH/SULFA <=10 SENSITIVE Sensitive     CLINDAMYCIN <=0.25 SENSITIVE Sensitive     RIFAMPIN <=0.5 SENSITIVE Sensitive     Inducible Clindamycin NEGATIVE Sensitive     * STAPHYLOCOCCUS AUREUS  CSF culture     Status: None   Collection Time: 10/04/15  6:00 AM  Result Value Ref Range Status   Specimen Description CSF  Final   Special Requests Normal  Final   Gram Stain   Final    CYTOSPIN WBC PRESENT,BOTH PMN AND MONONUCLEAR NO ORGANISMS SEEN Gram Stain Report Called to,Read Back By and Verified With: A. DENNIS RN AT 0700 ON 03.28.17 BY SHUEA    Culture   Final    NO GROWTH 3 DAYS Performed at [facility]    Report Status 10/07/2015 FINAL  Final  Anaerobic culture     Status: None (Preliminary result)   Collection Time: 10/04/15  6:00 AM  Result Value Ref Range Status   Specimen Description CSF  Final   Special Requests NONE  Final   Gram Stain   Final    WBC PRESENT,BOTH PMN AND MONONUCLEAR NO ORGANISMS  SEEN CYTOSPIN SMEAR    Culture   Final    NO ANAEROBES ISOLATED; CULTURE IN PROGRESS FOR 5 DAYS [facility]    Report Status PENDING  Incomplete  MRSA PCR Screening     Status: None   Collection Time: 10/04/15 10:52 PM  Result Value Ref Range Status   MRSA by PCR NEGATIVE NEGATIVE Final    Comment:        The GeneXpert MRSA Assay (FDA approved for NASAL specimens only), is one component of a comprehensive MRSA colonization surveillance program. It is not intended to diagnose MRSA infection nor to guide or monitor treatment for MRSA infections.   Culture, blood (routine x 2)     Status: None (Preliminary result)   Collection Time: 10/06/15  8:34 AM  Result Value Ref Range Status   Specimen Description BLOOD RIGHT ANTECUBITAL  Final   Special Requests IN PEDIATRIC BOTTLE 1CC  Final   Culture  Setup Time   Final    GRAM POSITIVE  COCCI IN CLUSTERS AEROBIC BOTTLE ONLY CRITICAL RESULT CALLED TO, READ BACK BY AND VERIFIED WITH: S WHITE,RN @0247  10/07/15 MKELLY    Culture PENDING  Incomplete   Report Status PENDING  Incomplete  Culture, blood (routine x 2)     Status: None (Preliminary result)   Collection Time: 10/06/15  8:37 AM  Result Value Ref Range Status   Specimen Description BLOOD LEFT HAND  Final   Special Requests IN PEDIATRIC BOTTLE 2CC  Final   Culture  Setup Time   Final    GRAM POSITIVE COCCI IN CLUSTERS IN PEDIATRIC BOTTLE CRITICAL RESULT CALLED TO, READ BACK BY AND VERIFIED WITH: L Ronny FlurryWILSON,RN AT [id number] 10/07/15 BY L BENFIELD    Culture GRAM POSITIVE COCCI  Final   Report Status PENDING  Incomplete    Impression/Plan:  1. MSSA infective endocarditis - on nafcillin for better CNS penetration. Will need a prolonged course.  No indication for valve replacement at this time.   Repeat blood cultures growing again.  2. CVA - from septic emboli.  3. IVDU - will need drug rehab once she recovers.     Dr. Daiva EvesVan Dam will be available over the weekend if needed. thanks

## 2015-10-07 NOTE — Consult Note (Signed)
PULMONARY / CRITICAL CARE MEDICINE   Name: Cassandra Wall MRN: [id number] DOB: 04-26-1981    ADMISSION DATE:  10/04/2015 CONSULTATION DATE:  10/06/15  REFERRING MD:  Patel,P  CHIEF COMPLAINT: Hypertensive urgency related to opioid withdrawl  HISTORY OF PRESENT ILLNESS:  Cassandra Wall is a 35 year old female with past medical history significant for polysubstance abuse(iv heroine and cocaine), tobacco abuse who presented to the ED with complaints of left sided weakness which was going on for two days prior to the admission.  CT of the brain was concerning for right sided infarct She was evaluated by neurology and they think it is septic emboli from IVDU. She has track marks all over her body. From her note it appears that her brother passed away with endocarditis due to IVDU. Rt side endocarditis. vent  SUBJECTIVE:  Intubated, 1 unit prbc  VITAL SIGNS: BP 96/60 mmHg  Pulse 64  Temp(Src) 98.2 F (36.8 C) (Oral)  Resp 24  Ht 5\' 8"  (1.727 m)  Wt 52.7 kg (116 lb 2.9 oz)  BMI 17.67 kg/m2  SpO2 98%  HEMODYNAMICS:    VENTILATOR SETTINGS: Vent Mode:  [-] PRVC FiO2 (%):  [40 %-50 %] 40 % Set Rate:  [24 bmp] 24 bmp Vt Set:  [490 mL] 490 mL PEEP:  [5 cmH20] 5 cmH20 Plateau Pressure:  [14 cmH20-21 cmH20] 14 cmH20  INTAKE / OUTPUT: I/O last 3 completed shifts: In: 6314.4 [P.O.:1440; I.V.:3539.4; Blood:280; NG/GT:155; IV Piggyback:900] Out: 2575 [Urine:2475; Emesis/NG output:100]  PHYSICAL EXAMINATION: General:  vent Neuro:  rass -3 HEENT:  Atraumatic, normocephalic, no discharge Cardiovascular: regular. No MRG Lungs:  ronchi Abdomen:  Tender on light palpation, no r/g, BS wnl Musculoskeletal:  No joint deformity Skin: track marks all over the body  *LABS:  BMET  Recent Labs Lab 10/06/15 1540 10/06/15 2245 10/07/15 0726  NA 148* 145 145  K 3.8 3.9 3.9  CL 124* 122* 122*  CO2 13* 14* 16*  BUN 53* 49* 48*  CREATININE 1.28* 1.06* 1.04*  GLUCOSE 99 99  118*    Electrolytes  Recent Labs Lab 10/06/15 0007 10/06/15 0832 10/06/15 1540 10/06/15 2245 10/07/15 0726  CALCIUM  --  7.4* 7.8* 7.7* 7.8*  MG 2.5* 2.6* 2.5*  --  2.4  PHOS 4.4  --  3.6  --  4.6    CBC  Recent Labs Lab 10/06/15 0832 10/06/15 1540 10/07/15 0726  WBC 11.6* 9.7 9.3  HGB 8.0* 7.1* 7.4*  HCT 24.3* 21.8* 23.6*  PLT 75* 60* 63*    Coag's  Recent Labs Lab 10/04/15 0445 10/04/15 0941 10/06/15 0832 10/07/15 0726  APTT 31 35  --   --   INR 1.31 1.39 1.29 1.45    Sepsis Markers  Recent Labs Lab 10/04/15 1232 10/06/15 1539 10/06/15 2248  LATICACIDVEN 0.9 2.6* 1.7  PROCALCITON 21.66  --   --     ABG  Recent Labs Lab 10/06/15 1255 10/06/15 2110  PHART 7.416 7.316*  PCO2ART 17.9* 25.6*  PO2ART 107* 129.0*    Liver Enzymes  Recent Labs Lab 10/06/15 0832 10/06/15 1540 10/07/15 0726  AST 36 35 27  ALT 21 20 16   ALKPHOS 102 96 90  BILITOT 0.4 0.6 1.3*  ALBUMIN 1.6* 1.6* 1.6*    Cardiac Enzymes No results for input(s): TROPONINI, PROBNP in the last 168 hours.  Glucose  Recent Labs Lab 10/04/15 2039 10/05/15 1746 10/06/15 1645 10/07/15 0336 10/07/15 0752  GLUCAP 99 103* 90 88 112*  Imaging Dg Chest 1 View  10/06/2015  CLINICAL DATA:  35 year old female, combative. Large right MCA territory infarct. Initial encounter. EXAM: CHEST 1 VIEW COMPARISON:  10/04/2015 and earlier. FINDINGS: Supine AP view of the chest at 1319 hours. Progressed and more confluent left mid lung opacity. Persistent patchy and nodular right lung opacity. Stable lung volumes. Mediastinal contours remain normal. No superimposed pneumothorax or pleural effusion. No acute pulmonary edema. IMPRESSION: 1. Progressed confluent left mid lung, and patchy and nodular a right lung opacity since 10/04/2015. 2. Top differential considerations in this setting include aspiration pneumonia, and disseminated infection from emboli or sepsis. 3. No pleural effusion  identified. Electronically Signed   By: Odessa Fleming M.D.   On: 10/06/2015 13:55   Dg Abd 1 View  10/06/2015  CLINICAL DATA:  35 year old female, combative. Large right MCA territory infarct. Initial encounter. EXAM: ABDOMEN - 1 VIEW COMPARISON:  CT Abdomen and Pelvis 03/16/2011. FINDINGS: Supine view of the abdomen at 1319 hours. Mild motion artifact. Non obstructed bowel gas pattern. Retained stool throughout the colon. No definite pneumoperitoneum on this supine view. No acute osseous abnormality identified; dextro convex curvature of the spine probably is positional. IMPRESSION: Non obstructed bowel gas pattern. Electronically Signed   By: Odessa Fleming M.D.   On: 10/06/2015 13:52   Ct Head Wo Contrast  10/06/2015  CLINICAL DATA:  Continued surveillance cerebral infarction. Sepsis. Endocarditis. Gram-positive Cocci. EXAM: CT HEAD WITHOUT CONTRAST TECHNIQUE: Contiguous axial images were obtained from the base of the skull through the vertex without intravenous contrast. COMPARISON:  CT head 10/04/2015.  MR head 10/04/2015. FINDINGS: Evolving cytotoxic edema, RIGHT frontal and temporal lobe status post RIGHT MCA occlusion. Effacement of the sylvian fissure and regional sulci without significant right-to-left shift. Smaller areas of hypoattenuation, BILATERAL occipital lobes, appears similar to 3/28 CT. See for instance image 18 series 3. Given their location at the gray-white junction, BILATERAL brain abscesses are not excluded. The intensity of restricted diffusion on prior MR is also more intense than that associated with the acute infarction. IMPRESSION: Evolving cytotoxic edema, RIGHT frontal and temporal lobe status post RIGHT MCA occlusion. No significant right-to-left shift or hemorrhagic transformation. BILATERAL occipital lobe hypodensities, subcentimeter size, could represent brain abscesses (versus acute subcortical infarcts). Consider continued surveillance with post infusion MRI. Electronically Signed    By: Elsie Stain M.D.   On: 10/06/2015 14:13   Dg Chest Port 1 View  10/06/2015  CLINICAL DATA:  Endotracheal tube adjustment EXAM: PORTABLE CHEST 1 VIEW COMPARISON:  Chest radiograph from earlier today. FINDINGS: Endotracheal tube tip is 2.9 cm above the carina. Left internal jugular central venous catheter terminates in the right atrium. Enteric tube enters stomach with the tip not seen on this image. Stable cardiomediastinal silhouette with normal heart size. No pneumothorax. Stable small right pleural effusion. Patchy opacities throughout the mid to lower lungs bilaterally are not appreciably changed, several of which appear cavitary. IMPRESSION: 1. Well-positioned endotracheal and enteric tubes. 2. Patchy opacities throughout the mid to lower lungs bilaterally appear cavitary, raising concern for septic emboli. 3. Stable small right pleural effusion. Electronically Signed   By: Delbert Phenix M.D.   On: 10/06/2015 22:38   Dg Chest Port 1 View  10/06/2015  CLINICAL DATA:  Central line and endotracheal tube placement. EXAM: PORTABLE CHEST 1 VIEW COMPARISON:  10/06/2015 at 1319 hours FINDINGS: Endotracheal tube tip projects the right mainstem bronchus. Recommend retracting 1 to 2 cm. Left internal jugular central venous line tip projects at  the caval atrial junction and it may just enter the right atrium. Orogastric tube passes below the diaphragm well within the stomach. Patchy areas of airspace lung opacity are similar to the earlier exam. No pneumothorax. IMPRESSION: 1. Endotracheal tube tip projects in the right mainstem bronchus. Recommend retracting 1-2 cm. Critical Value/emergent results were called by telephone at the time of interpretation on 10/06/2015 at 8:40 pm to the ICU nurse, who verbally acknowledged these results. 2. Left internal jugular central venous line tip projects at the caval atrial junction. It may just into the right atrium. No pneumothorax. 3. Orogastric tube is well positioned  passing well below the diaphragm into the stomach. Electronically Signed   By: Amie Portland M.D.   On: 10/06/2015 20:40   Dg Abd Portable 1v  10/06/2015  CLINICAL DATA:  Encounter for orogastric tube placement. EXAM: PORTABLE ABDOMEN - 1 VIEW COMPARISON:  10/06/2015 FINDINGS: The orogastric tube extends well below the diaphragm to curl within stomach. Tip lies in the distal stomach. IMPRESSION: Well-positioned orogastric tube extending into the distal stomach. Electronically Signed   By: Amie Portland M.D.   On: 10/06/2015 20:36     STUDIES:  3/293/30 US renal . No hydronephrosis. 2. Increased parenchymal echogenicity bilaterally compatible with chronic medical renal disease.  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 No significant right-to-left shift or hemorrhagic transformation.BILATERAL occipital lobe hypodensities, subcentimeter size, could represent brain abscesses (versus acute subcortical infarcts). Consider continued surveillance with post infusion MRI.  3/28 echo >- Findings are consistent with endocarditis of the tricuspid and  aortic valves. EF of 50-55%  CULTURES:  3/28 BC > mssa 3/30>>>POS again  3/28 CSF-normal  ANTIBIOTICS: Naficillin 3/30>>>  SIGNIFICANT EVENTS: NONE  LINES/TUBES: 3/30 ETT>>> 3/30 line >>>  DISCUSSION: 35 year old female with h/o polysubstance abuse now presents with right sided septic emboli ddue to IVDU.  ASSESSMENT / PLAN:  PULMONARY A: Acute Hypoxemic respiratory failure Multifocal PNA due to septic emboli P:   ABg reviewed, repeat abg, may need MV increase pcxr in am  SBT if ph allow and sedation  CARDIOVASCULAR A:  Endocarditis due to IVDU P:  Not a candidate for surgery, cvts following Cardiology following the patient  RENAL A:   AKI NONAG acidosis from saline P:   Normal saline aviod, dc Bicarb okay for NONAg  Follow  chemistry  GASTROINTESTINAL A:   ?mesentric ischemia possibly due to IVDU P:   tolerating feeds Protonix for GIP  HEMATOLOGIC A:   Anemia Thrombocytopenia, spesis Septic embolis P:  CBC in am scds for DVT prophylaxis Heparin sq held  INFECTIOUS A:   MSSA endocarditis with septic emboli Suspected cellulitis involving the drug injection site P:   No role pct at all  Continue naficillin as per ID, and assess if can clear this Repeat BC in am   ENDOCRINE A:   No acute issues P:   BG intermitently  NEUROLOGIC A:   Acute embolic stroke P:   RASS goal: -2 Prn ativan/haldol  FAMILY  - Updates: no family present at bedside   - Inter-disciplinary family meet or Palliative Care meeting due by: 10/13/15  Ccm time 35 min  Mcarthur Rossetti. Tyson Alias, MD, FACP Pgr: [phone] [facility]

## 2015-10-07 NOTE — Care Management Note (Signed)
Case Management Note  Patient Details  Name: [name] MRN: [id number] Date of Birth: 22-Jun-1981  Subjective/Objective:   Pt admitted with sepsis secondary to endocarditis in an IV drug abuser   - ventilated on IV sedation.              Action/Plan:  Pt is from home with husband; mom claims husband is also on drugs and therefore mother would like to be the decision maker, tentative SNF placement due to long term need of IV antibiotics in drug abuser - CSW consulted.  CM will continue to monitor for disposition needs   Expected Discharge Date:   (unknown)               Expected Discharge Plan:  Skilled Nursing Facility  In-House Referral:  Clinical Social Work  Discharge planning Services  CM Consult  Post Acute Care Choice:    Choice offered to:     DME Arranged:    DME Agency:     HH Arranged:    HH Agency:     Status of Service:  In process, will continue to follow  Medicare Important Message Given:    Date Medicare IM Given:    Medicare IM give by:    Date Additional Medicare IM Given:    Additional Medicare Important Message give by:     If discussed at Long Length of Stay Meetings, dates discussed:    Additional Comments:  Cherylann ParrClaxton, Jaycee S, RN 10/07/2015, 9:23 AM

## 2015-10-07 NOTE — Progress Notes (Signed)
STROKE TEAM PROGRESS NOTE   SUBJECTIVE (INTERVAL HISTORY) Her mom is at the bedside. Pt was intubated yesterday for agitation and concerning for airway protection. Currently on propofol, not responding to voice or pain. BP mildly low and HR normal range. MRA pending to rule out mycotic aneurysm.   OBJECTIVE Temp:  [97.7 F (36.5 C)-98.6 F (37 C)] 98.4 F (36.9 C) (03/31 1540) Pulse Rate:  [22-114] 59 (03/31 1500) Cardiac Rhythm:  [-] Normal sinus rhythm;Sinus tachycardia (03/31 1200) Resp:  [0-34] 0 (03/31 1500) BP: (83-137)/(56-97) 99/62 mmHg (03/31 1500) SpO2:  [97 %-100 %] 97 % (03/31 1508) FiO2 (%):  [40 %-50 %] 40 % (03/31 1508) Weight:  [116 lb 2.9 oz (52.7 kg)] 116 lb 2.9 oz (52.7 kg) (03/31 0448)  CBC:   Recent Labs Lab 10/06/15 0832 10/06/15 1540 10/07/15 0726  WBC 11.6* 9.7 9.3  NEUTROABS 9.8*  --  8.1*  HGB 8.0* 7.1* 7.4*  HCT 24.3* 21.8* 23.6*  MCV 79.9 80.4 80.5  PLT 75* 60* 63*    Basic Metabolic Panel:   Recent Labs Lab 10/06/15 1540 10/06/15 2245 10/07/15 0726  NA 148* 145 145  K 3.8 3.9 3.9  CL 124* 122* 122*  CO2 13* 14* 16*  GLUCOSE 99 99 118*  BUN 53* 49* 48*  CREATININE 1.28* 1.06* 1.04*  CALCIUM 7.8* 7.7* 7.8*  MG 2.5*  --  2.4  PHOS 3.6  --  4.6    Lipid Panel:     Component Value Date/Time   CHOL 102 10/06/2015 1539   TRIG 193* 10/06/2015 1539   HDL <10* 10/06/2015 1539   CHOLHDL NOT CALCULATED 10/06/2015 1539   VLDL 39 10/06/2015 1539   LDLCALC NOT CALCULATED 10/06/2015 1539   HgbA1c: No results found for: HGBA1C Urine Drug Screen:     Component Value Date/Time   LABOPIA POSITIVE* 10/04/2015 0908   COCAINSCRNUR POSITIVE* 10/04/2015 0908   LABBENZ NONE DETECTED 10/04/2015 0908   AMPHETMU NONE DETECTED 10/04/2015 0908   THCU NONE DETECTED 10/04/2015 0908   LABBARB NONE DETECTED 10/04/2015 0908      IMAGING I have personally reviewed the radiological images below and agree with the radiology interpretations.  Ct  Head Wo Contrast  10/04/2015  IMPRESSION: Suggestion of decreased attenuation involving the right external capsule and right temporal lobe, which may reflect an evolving acute right-sided infarct. No significant mass effect or midline shift seen.   10/06/15   Mr Brain Wo Contrast  10/04/2015  IMPRESSION: 1. Acute/subacute large right MCA territory nonhemorrhagic infarct. 2. Additional foci of acute nonhemorrhagic infarct involves the right occipital lobe, left occipital parietal lobe, in the left cerebellum. 3. No other significant white matter disease or evidence for chronic abnormality is present.   Dg Chest Port 1 View  10/04/2015  IMPRESSION: Patchy bilateral airspace opacities raise concern for multifocal pneumonia.   2D Echocardiogram  - Left ventricle: The cavity size was normal. Systolic function was normal. The estimated ejection fraction was in the range of 50% to 55%. Wall motion was normal; there were no regional wall motion abnormalities. Left ventricular diastolic function parameters were normal. - Aortic valve: There was a vegetation measuring 0.66 cm x 0.52 cm on the noncoronary cusp. Transvalvular velocity was within the normal range. There was no stenosis. There was no regurgitation. - Mitral valve: There was trivial regurgitation. - Right ventricle: The cavity size was normal. Wall thickness was normal. Systolic function was normal. - Atrial septum: No defect or patent foramen ovale  was identified by color flow Doppler. - Tricuspid valve: There was a mobile, 0.9 cm (W) x 1.3 cm (L) vegetation on the atrial side of the septal leaflet. There was also a 2.6 cm x 0.8 cm mobile vegetation on the ventricular side of the anterior leaflet. There was moderate regurgitation. - Pulmonary arteries: Systolic pressure was within the normal range. PA peak pressure: 25 mm Hg (S). - Pericardium, extracardiac: A small pericardial effusion was identified. 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  MRA head and neck - pending  MRI brain with and without contrast - pending  EEG Abnormal awake and drowsy routine inpatient EEG suggestive of mild generalized encephalopathy with superimposed focal neuronal dysfunction in the right frontocentral parietal region, expected from the known right MCA infarct. Clinical correlation is recommended .    PHYSICAL EXAM  Temp:  [97.7 F (36.5 C)-98.6 F (37 C)] 98.4 F (36.9 C) (03/31 1540) Pulse Rate:  [22-114] 59 (03/31 1500) Resp:  [0-34] 0 (03/31 1500) BP: (83-137)/(56-97) 99/62 mmHg (03/31 1500) SpO2:  [97 %-100 %] 97 % (03/31 1508) FiO2 (%):  [40 %-50 %] 40 % (03/31 1508) Weight:  [116 lb 2.9 oz (52.7 kg)] 116 lb 2.9 oz (52.7 kg) (03/31 0448)  General - thin built, well developed, intubated.  Ophthalmologic - Fundi not visualized due to ET positioning.  Cardiovascular - Regular rate and rhythm.  Neuro - intubated on propofol, not responding to voice, not following commands. PERRL, minimal doll's eyes, left corneal absent, right corneal weak reflex, positive gag and cough. On pain stimulation, right LE minimal withdraw, not movement at other extremities. DTR diminished and no babinski. Sensation, coordination and gait not tested.     ASSESSMENT/PLAN Ms. Zyan Mirkin is a 35 y.o. female with history of polysubstance abuse, smoker, hepC positive, CKD, seizure, and anxiety presenting with left sided weakness. She did not receive IV t-PA due to delay in arrival.   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  lovenox for VTE prophylaxis Diet NPO time specified  No antithrombotic prior to admission, now on No antithrombotic. No antithrombotic indicated for endocarditis.  Therapy recommendations:  pending  Disposition:  Pending  ? 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   UDS positive for opiates and cocaine this admission  Anemia  Hgb 8.6 -> 9.0 -> 7.1->7.4  CBC monitoring  Tobacco abuse  Current smoker  Smoking cessation counseling provided  Other Stroke Risk Factors  Occasional ETOH  use  Migraines  Other Active Problems  Hx psychosis  Hx seizures  Acute renal failure Cr 4.5 -> 3.88 ->1.41 -> 1.28 ->1.04  Thrombocytopenia  Prolonged Q-T interval  hypothermia  Hospital day # 3  This patient is critically ill due to large right MCA infarct, sepsis with endocarditis, respiratory distress, AKI, anemia and at significant risk of neurological worsening, death form recurrent infarct, hemorrhagic transformation, septic shock, renal failure and respiratory failure. This patient's care requires constant monitoring of vital signs, hemodynamics, respiratory and cardiac monitoring, review of multiple databases, neurological assessment, discussion with family, other specialists and medical decision making of high complexity. I spent 35 minutes of neurocritical care time in the care of this  patient.  Marvel Plan, MD PhD Stroke Neurology 10/07/2015 4:07 PM    To contact Stroke Continuity provider, please refer to [email or web address]. After hours, contact General Neurology

## 2015-10-08 ENCOUNTER — Inpatient Hospital Stay (HOSPITAL_COMMUNITY): Payer: Medicaid Other

## 2015-10-08 LAB — COMPREHENSIVE METABOLIC PANEL
ALBUMIN: 1.6 g/dL — AB (ref 3.5–5.0)
ALT: 15 U/L (ref 14–54)
ANION GAP: 8 (ref 5–15)
AST: 25 U/L (ref 15–41)
Alkaline Phosphatase: 83 U/L (ref 38–126)
BILIRUBIN TOTAL: 0.8 mg/dL (ref 0.3–1.2)
BUN: 41 mg/dL — ABNORMAL HIGH (ref 6–20)
CO2: 21 mmol/L — ABNORMAL LOW (ref 22–32)
Calcium: 7.8 mg/dL — ABNORMAL LOW (ref 8.9–10.3)
Chloride: 116 mmol/L — ABNORMAL HIGH (ref 101–111)
Creatinine, Ser: 0.91 mg/dL (ref 0.44–1.00)
GFR calc Af Amer: >60 mL/min (ref >60)
Glucose, Bld: 100 mg/dL — ABNORMAL HIGH (ref 65–99)
POTASSIUM: 3.3 mmol/L — AB (ref 3.5–5.1)
Sodium: 145 mmol/L (ref 135–145)
TOTAL PROTEIN: 5.1 g/dL — AB (ref 6.5–8.1)

## 2015-10-08 LAB — TYPE AND SCREEN
ABO/RH(D): O POS
Antibody Screen: NEGATIVE
Unit division: 0

## 2015-10-08 LAB — CULTURE, BLOOD (ROUTINE X 2)

## 2015-10-08 LAB — CBC WITH DIFFERENTIAL/PLATELET
BASOS ABS: 0 10*3/uL (ref 0.0–0.1)
Basophils Relative: 0 %
Eosinophils Absolute: 0 10*3/uL (ref 0.0–0.7)
Eosinophils Relative: 0 %
HEMATOCRIT: 25.4 % — AB (ref 36.0–46.0)
Hemoglobin: 7.8 g/dL — ABNORMAL LOW (ref 12.0–15.0)
LYMPHS PCT: 14 %
Lymphs Abs: 1.4 10*3/uL (ref 0.7–4.0)
MCH: 25.2 pg — ABNORMAL LOW (ref 26.0–34.0)
MCHC: 30.7 g/dL (ref 30.0–36.0)
MCV: 82.2 fL (ref 78.0–100.0)
Monocytes Absolute: 0.7 10*3/uL (ref 0.1–1.0)
Monocytes Relative: 7 %
NEUTROS ABS: 7.5 10*3/uL (ref 1.7–7.7)
Neutrophils Relative %: 79 %
PLATELETS: 70 10*3/uL — AB (ref 150–400)
RBC: 3.09 MIL/uL — AB (ref 3.87–5.11)
RDW: 15.6 % — ABNORMAL HIGH (ref 11.5–15.5)
WBC: 9.6 10*3/uL (ref 4.0–10.5)

## 2015-10-08 LAB — GLUCOSE, CAPILLARY
GLUCOSE-CAPILLARY: 106 mg/dL — AB (ref 65–99)
GLUCOSE-CAPILLARY: 100 mg/dL — AB (ref 65–99)
GLUCOSE-CAPILLARY: 90 mg/dL (ref 65–99)
GLUCOSE-CAPILLARY: 87 mg/dL (ref 65–99)
Glucose-Capillary: 101 mg/dL — ABNORMAL HIGH (ref 65–99)
Glucose-Capillary: 115 mg/dL — ABNORMAL HIGH (ref 65–99)
Glucose-Capillary: 99 mg/dL (ref 65–99)

## 2015-10-08 LAB — HEMOGLOBIN A1C
HEMOGLOBIN A1C: 5.7 % — AB (ref 4.8–5.6)
MEAN PLASMA GLUCOSE: 117 mg/dL

## 2015-10-08 LAB — PHOSPHORUS
PHOSPHORUS: 4.6 mg/dL (ref 2.5–4.6)
Phosphorus: 4.4 mg/dL (ref 2.5–4.6)

## 2015-10-08 LAB — MAGNESIUM
MAGNESIUM: 1.7 mg/dL (ref 1.7–2.4)
Magnesium: 2 mg/dL (ref 1.7–2.4)

## 2015-10-08 MED ORDER — GADOBENATE DIMEGLUMINE 529 MG/ML IV SOLN
10.0000 mL | Freq: Once | INTRAVENOUS | Status: AC | PRN
  Administered 2015-10-08: 10 mL via INTRAVENOUS

## 2015-10-08 MED ORDER — FREE WATER
200.0000 mL | Freq: Three times a day (TID) | Status: DC
  Administered 2015-10-08 – 2015-10-09 (×3): 200 mL

## 2015-10-08 MED ORDER — POTASSIUM CHLORIDE 20 MEQ/15ML (10%) PO SOLN
40.0000 meq | ORAL | Status: AC
  Administered 2015-10-08 (×2): 40 meq via ORAL
  Filled 2015-10-08 (×2): qty 30

## 2015-10-08 NOTE — Progress Notes (Signed)
Patient is in MRI. Sedation was increased several times due to patient agitation. Vital signs are stable, please see documentation. Will continue to monitor closely and reduce sedation when able to.

## 2015-10-08 NOTE — Progress Notes (Signed)
PULMONARY / CRITICAL CARE MEDICINE   Name: Cassandra Wall MRN: [id number] DOB: 1980/07/15    ADMISSION DATE:  10/04/2015 CONSULTATION DATE:  10/06/15  REFERRING MD:  Patel,P  CHIEF COMPLAINT: Hypertensive urgency related to opioid withdrawl  HISTORY OF PRESENT ILLNESS:  Cassandra Wall is a 35 year old female with past medical history significant for polysubstance abuse(iv heroine and cocaine), tobacco abuse who presented to the ED with complaints of left sided weakness which was going on for two days prior to the admission.  CT of the brain was concerning for right sided infarct She was evaluated by neurology and they think it is septic emboli from IVDU. She has track marks all over her body. From her note it appears that her brother passed away with endocarditis due to IVDU. Rt side endocarditis. vent  SUBJECTIVE:  Intubated, 1 unit prbc  VITAL SIGNS: BP 126/64 mmHg  Pulse 81  Temp(Src) 101.6 F (38.7 C) (Oral)  Resp 24  Ht 5' 8"  (1.727 m)  Wt 121 lb 7.6 oz (55.1 kg)  BMI 18.47 kg/m2  SpO2 97%  HEMODYNAMICS:    VENTILATOR SETTINGS: Vent Mode:  [-] PRVC FiO2 (%):  [40 %] 40 % Set Rate:  [24 bmp] 24 bmp Vt Set:  [490 mL] 490 mL PEEP:  [5 cmH20] 5 cmH20 Plateau Pressure:  [14 cmH20-20 cmH20] 20 cmH20  INTAKE / OUTPUT: I/O last 3 completed shifts: In: 8185.5 [I.V.:5720.5; Blood:280; NG/GT:1385; IV [id number]:111] Out: 7356 [Urine:1570; Emesis/NG output:100]  PHYSICAL EXAMINATION: General:  Sedated on vent Neuro:  rass -3, left sided weakness  HEENT:  Atraumatic, normocephalic, no discharge Cardiovascular: regular. No MRG Lungs:  Ronchi, equal chest rise  Abdomen:  Tender on light palpation, no r/g, BS wnl Musculoskeletal:  No joint deformity Skin: track marks all over the body  *LABS:  BMET  Recent Labs Lab 10/06/15 2245 10/07/15 0726 10/08/15 0559  NA 145 145 145  K 3.9 3.9 3.3*  CL 122* 122* 116*  CO2 14* 16* 21*  BUN 49* 48* 41*   CREATININE 1.06* 1.04* 0.91  GLUCOSE 99 118* 100*    Electrolytes  Recent Labs Lab 10/06/15 1540 10/06/15 2245 10/07/15 0726 10/07/15 2035 10/08/15 0559  CALCIUM 7.8* 7.7* 7.8*  --  7.8*  MG 2.5*  --  2.4 2.6*  --   PHOS 3.6  --  4.6 4.5  --     CBC  Recent Labs Lab 10/06/15 1540 10/07/15 0726 10/08/15 0559  WBC 9.7 9.3 9.6  HGB 7.1* 7.4* 7.8*  HCT 21.8* 23.6* 25.4*  PLT 60* 63* 70*    Coag's  Recent Labs Lab 10/04/15 0445 10/04/15 0941 10/06/15 0832 10/07/15 0726  APTT 31 35  --   --   INR 1.31 1.39 1.29 1.45    Sepsis Markers  Recent Labs Lab 10/04/15 1232 10/06/15 1539 10/06/15 2248  LATICACIDVEN 0.9 2.6* 1.7  PROCALCITON 21.66  --   --     ABG  Recent Labs Lab 10/06/15 1255 10/06/15 2110 10/07/15 1217  PHART 7.416 7.316* 7.364  PCO2ART 17.9* 25.6* 29.3*  PO2ART 107* 129.0* 82.0    Liver Enzymes  Recent Labs Lab 10/06/15 1540 10/07/15 0726 10/08/15 0559  AST 35 27 25  ALT 20 16 15   ALKPHOS 96 90 83  BILITOT 0.6 1.3* 0.8  ALBUMIN 1.6* 1.6* 1.6*    Cardiac Enzymes No results for input(s): TROPONINI, PROBNP in the last 168 hours.  Glucose  Recent Labs Lab 10/07/15 1225 10/07/15 1539  10/07/15 1909 10/08/15 0002 10/08/15 0341 10/08/15 0841  GLUCAP 113* 127* 106* 101* 99 115*    Imaging Mr Jodene Nam Head Wo Contrast  10/08/2015  CLINICAL DATA:  Follow-up embolic infarcts attributed to endocarditis. Sepsis in the setting of intravenous drug abuse. EXAM: MRI HEAD WITHOUT AND WITH CONTRAST MRA HEAD WITHOUT CONTRAST MRI NECK WITHOUT AND WITH CONTRAST TECHNIQUE: Multiplanar, multiecho pulse sequences of the brain and surrounding structures were obtained without and with intravenous contrast. Angiographic images of the head were obtained using MRA technique without contrast. Multiplanar, multiecho pulse sequences of the neck and surrounding structures were obtained without and with intravenous contrast. CONTRAST:  59m MULTIHANCE  GADOBENATE DIMEGLUMINE 529 MG/ML IV SOLN COMPARISON:  CT head October 06, 2015 and MRI of the brain October 04, 2015 FINDINGS: MRI HEAD FINDINGS Multiple sequences are mildly motion degraded. Similar size and distribution of RIGHT frontotemporal parietal lobes reduced diffusion with low ADC values. Patchy bilateral occipital lobe reduced diffusion was present previously. No new areas of reduced diffusion or further propagation of prior infarcts. No susceptibility artifact to suggest hemorrhagic conversion. RIGHT frontotemporal FLAIR T2 bright cytotoxic edema results in local sulcal effacement and 3 mm RIGHT to LEFT midline shift. Mild RIGHT lateral ventricle effacement without ventricular entrapment or hydrocephalus. No suspicious intraparenchymal enhancement. No abnormal extra-axial fluid collections or abnormal enhancement. No extra-axial masses. Normal major intracranial vascular flow voids present at skull base. Ocular globes and orbital contents are normal. Paranasal sinuses and mastoid air cells are well aerated. No abnormal sellar expansion. No cerebellar tonsillar ectopia. No suspicious calvarial bone marrow signal. Life-support lines in place. MRA HEAD FINDINGS Mildly motion degraded examination. Anterior circulation: Normal flow related enhancement of the included cervical, petrous, cavernous and supraclinoid internal carotid arteries. Patent anterior communicating artery with fenestration versus motion artifact. Distal RIGHT M1 occlusion without significant collateral vessels by MRA. Normal flow related enhancement of the anterior cerebral arteries, including distal segments. No large vessel occlusion, high-grade stenosis, abnormal luminal irregularity, aneurysm. Posterior circulation: Codominant vertebral artery's. Basilar artery is patent, with normal flow related enhancement of the main branch vessels. Normal flow related enhancement of the posterior cerebral arteries. Small bilateral posterior  communicating arteries present. No large vessel occlusion, high-grade stenosis, abnormal luminal irregularity, aneurysm. MRI NECK FINDINGS Mild motion degraded examination. Source images and MIP image were reviewed. Due to motion, limited assessment of arch vessel origins. The common carotid arteries are widely patent bilaterally. The carotid bifurcation is patent bilaterally and there is no significant carotid stenosis. Both vertebral arteries and patent to the basilar without significant vertebral stenosis. There is no evidence for atherosclerosis or flow limiting stenosis. IMPRESSION: MRI HEAD:  No new infarcts. Evolving large acute RIGHT MCA territory infarct without hemorrhagic conversion or propagation. Evolving small bilateral occipital lobe infarcts. MRA HEAD: Mildly motion degraded examination. Acute RIGHT distal M1 occlusion consistent with embolic phenomena. MRA NECK: Mildly motion degraded negative examination. Electronically Signed   By: CElon AlasM.D.   On: 10/08/2015 03:12   Mr Angiogram Neck W Wo Contrast  10/08/2015  CLINICAL DATA:  Follow-up embolic infarcts attributed to endocarditis. Sepsis in the setting of intravenous drug abuse. EXAM: MRI HEAD WITHOUT AND WITH CONTRAST MRA HEAD WITHOUT CONTRAST MRI NECK WITHOUT AND WITH CONTRAST TECHNIQUE: Multiplanar, multiecho pulse sequences of the brain and surrounding structures were obtained without and with intravenous contrast. Angiographic images of the head were obtained using MRA technique without contrast. Multiplanar, multiecho pulse sequences of the neck and surrounding structures were  obtained without and with intravenous contrast. CONTRAST:  14m MULTIHANCE GADOBENATE DIMEGLUMINE 529 MG/ML IV SOLN COMPARISON:  CT head October 06, 2015 and MRI of the brain October 04, 2015 FINDINGS: MRI HEAD FINDINGS Multiple sequences are mildly motion degraded. Similar size and distribution of RIGHT frontotemporal parietal lobes reduced diffusion with  low ADC values. Patchy bilateral occipital lobe reduced diffusion was present previously. No new areas of reduced diffusion or further propagation of prior infarcts. No susceptibility artifact to suggest hemorrhagic conversion. RIGHT frontotemporal FLAIR T2 bright cytotoxic edema results in local sulcal effacement and 3 mm RIGHT to LEFT midline shift. Mild RIGHT lateral ventricle effacement without ventricular entrapment or hydrocephalus. No suspicious intraparenchymal enhancement. No abnormal extra-axial fluid collections or abnormal enhancement. No extra-axial masses. Normal major intracranial vascular flow voids present at skull base. Ocular globes and orbital contents are normal. Paranasal sinuses and mastoid air cells are well aerated. No abnormal sellar expansion. No cerebellar tonsillar ectopia. No suspicious calvarial bone marrow signal. Life-support lines in place. MRA HEAD FINDINGS Mildly motion degraded examination. Anterior circulation: Normal flow related enhancement of the included cervical, petrous, cavernous and supraclinoid internal carotid arteries. Patent anterior communicating artery with fenestration versus motion artifact. Distal RIGHT M1 occlusion without significant collateral vessels by MRA. Normal flow related enhancement of the anterior cerebral arteries, including distal segments. No large vessel occlusion, high-grade stenosis, abnormal luminal irregularity, aneurysm. Posterior circulation: Codominant vertebral artery's. Basilar artery is patent, with normal flow related enhancement of the main branch vessels. Normal flow related enhancement of the posterior cerebral arteries. Small bilateral posterior communicating arteries present. No large vessel occlusion, high-grade stenosis, abnormal luminal irregularity, aneurysm. MRI NECK FINDINGS Mild motion degraded examination. Source images and MIP image were reviewed. Due to motion, limited assessment of arch vessel origins. The common  carotid arteries are widely patent bilaterally. The carotid bifurcation is patent bilaterally and there is no significant carotid stenosis. Both vertebral arteries and patent to the basilar without significant vertebral stenosis. There is no evidence for atherosclerosis or flow limiting stenosis. IMPRESSION: MRI HEAD:  No new infarcts. Evolving large acute RIGHT MCA territory infarct without hemorrhagic conversion or propagation. Evolving small bilateral occipital lobe infarcts. MRA HEAD: Mildly motion degraded examination. Acute RIGHT distal M1 occlusion consistent with embolic phenomena. MRA NECK: Mildly motion degraded negative examination. Electronically Signed   By: CElon AlasM.D.   On: 10/08/2015 03:12   Mr BJeri CosWUGContrast  10/08/2015  CLINICAL DATA:  Follow-up embolic infarcts attributed to endocarditis. Sepsis in the setting of intravenous drug abuse. EXAM: MRI HEAD WITHOUT AND WITH CONTRAST MRA HEAD WITHOUT CONTRAST MRI NECK WITHOUT AND WITH CONTRAST TECHNIQUE: Multiplanar, multiecho pulse sequences of the brain and surrounding structures were obtained without and with intravenous contrast. Angiographic images of the head were obtained using MRA technique without contrast. Multiplanar, multiecho pulse sequences of the neck and surrounding structures were obtained without and with intravenous contrast. CONTRAST:  165mMULTIHANCE GADOBENATE DIMEGLUMINE 529 MG/ML IV SOLN COMPARISON:  CT head October 06, 2015 and MRI of the brain October 04, 2015 FINDINGS: MRI HEAD FINDINGS Multiple sequences are mildly motion degraded. Similar size and distribution of RIGHT frontotemporal parietal lobes reduced diffusion with low ADC values. Patchy bilateral occipital lobe reduced diffusion was present previously. No new areas of reduced diffusion or further propagation of prior infarcts. No susceptibility artifact to suggest hemorrhagic conversion. RIGHT frontotemporal FLAIR T2 bright cytotoxic edema results in  local sulcal effacement and 3 mm RIGHT to LEFT midline  shift. Mild RIGHT lateral ventricle effacement without ventricular entrapment or hydrocephalus. No suspicious intraparenchymal enhancement. No abnormal extra-axial fluid collections or abnormal enhancement. No extra-axial masses. Normal major intracranial vascular flow voids present at skull base. Ocular globes and orbital contents are normal. Paranasal sinuses and mastoid air cells are well aerated. No abnormal sellar expansion. No cerebellar tonsillar ectopia. No suspicious calvarial bone marrow signal. Life-support lines in place. MRA HEAD FINDINGS Mildly motion degraded examination. Anterior circulation: Normal flow related enhancement of the included cervical, petrous, cavernous and supraclinoid internal carotid arteries. Patent anterior communicating artery with fenestration versus motion artifact. Distal RIGHT M1 occlusion without significant collateral vessels by MRA. Normal flow related enhancement of the anterior cerebral arteries, including distal segments. No large vessel occlusion, high-grade stenosis, abnormal luminal irregularity, aneurysm. Posterior circulation: Codominant vertebral artery's. Basilar artery is patent, with normal flow related enhancement of the main branch vessels. Normal flow related enhancement of the posterior cerebral arteries. Small bilateral posterior communicating arteries present. No large vessel occlusion, high-grade stenosis, abnormal luminal irregularity, aneurysm. MRI NECK FINDINGS Mild motion degraded examination. Source images and MIP image were reviewed. Due to motion, limited assessment of arch vessel origins. The common carotid arteries are widely patent bilaterally. The carotid bifurcation is patent bilaterally and there is no significant carotid stenosis. Both vertebral arteries and patent to the basilar without significant vertebral stenosis. There is no evidence for atherosclerosis or flow limiting stenosis.  IMPRESSION: MRI HEAD:  No new infarcts. Evolving large acute RIGHT MCA territory infarct without hemorrhagic conversion or propagation. Evolving small bilateral occipital lobe infarcts. MRA HEAD: Mildly motion degraded examination. Acute RIGHT distal M1 occlusion consistent with embolic phenomena. MRA NECK: Mildly motion degraded negative examination. Electronically Signed   By: Elon Alas M.D.   On: 10/08/2015 03:12   Dg Chest Port 1 View  10/08/2015  CLINICAL DATA:  35 year old female with endocarditis, multi focal pneumonia from septic emboli and respiratory failure. EXAM: PORTABLE CHEST 1 VIEW COMPARISON:  10/06/2015 and prior exams FINDINGS: Endotracheal tube is identified with tip 3.8 cm above the carina. An NG tube enters the stomach with tip off field of view. A left IJ central venous catheter is identified with tip overlying the superior cavoatrial junction. Increasing left lower lung airspace disease/consolidation noted. Continued patchy airspace disease within the right colon noted. There is no evidence of pneumothorax. There may be small bilateral pleural effusions present. IMPRESSION: Increasing left lower lung airspace disease/consolidation. No other significant change. Electronically Signed   By: Margarette Canada M.D.   On: 10/08/2015 09:33  remarkably worse on the left today   STUDIES:  3/293/30 US renal . No hydronephrosis.2. Increased parenchymal echogenicity bilaterally compatible with chronic medical renal disease. 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 No significant right-to-left shift or hemorrhagic transformation.BILATERAL occipital lobe hypodensities, subcentimeter size, could represent brain abscesses (versus acute subcortical infarcts). Consider continued surveillance with post infusion MRI. 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 EF of  50-55%  CULTURES:  3/28 BC > mssa 3/30>>>POS again  3/28 CSF-normal  ANTIBIOTICS: Naficillin 3/30>>>  SIGNIFICANT EVENTS: NONE  LINES/TUBES: 3/30 ETT>>> 3/30 line >>>  DISCUSSION: 35 year old female with h/o polysubstance abuse now presents with right sided septic emboli ddue to IVDU. Major issues are debilitation s/p CVA, agitation, NAG metabolic acidosis. Will cont supportive care over weekend, have made changes on vent. 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 P:  Not a candidate for surgery, cvts following Cardiology following the patient  RENAL A:   AKI Hyperchloremia  Hypokalemia  NONAG acidosis from saline P:   Cont bicarb gtt; decrease dose Replace K  Serial chemistry  strick I&O Replace free water  GASTROINTESTINAL A:   ?mesentric ischemia possibly due to IVDU P:   tolerating feeds Protonix for GIP  HEMATOLOGIC A:   Anemia Thrombocytopenia r/t sepsis   P:  CBC in am scds for DVT prophylaxis Heparin sq held  INFECTIOUS A:   MSSA endocarditis with septic emboli Suspected cellulitis involving the drug injection site P:   No role pct at all  Continue naficillin as per ID, and assess if can clear this Repeat BC  Repeat respiratory culture w/ worsening airspace disease.   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  P:   RASS goal: -2 Spoke to Neuro. W/ current MS no role for 3% Will follow neuro-status and re-involve if needed.  Will likely need trach/PEG  FAMILY  - Updates: no family present at bedside   - Inter-disciplinary family meet or Palliative Care  meeting due by: 10/13/15  Erick Colace ACNP-BC [place] Pager # [phone] OR # [phone] if no answer  Attending Note:  35 year old female with IVDA, endocarditis and septic emboli to the lungs and brain.  Patient is unresponsive.  On exam, lungs with diffuse crackles.  I reviewed CXR myself, diffuse infiltrate noted.  Discussed with neuro MD, they are signing off, there is little to be done in this case unfortunately.  Needs decision on trach/peg vs one way extubation.  In the meantime, continue abx and continue to monitor in the ICU.  The patient is critically ill with multiple organ systems failure and requires high complexity decision making for assessment and support, frequent evaluation and titration of therapies, application of advanced monitoring technologies and extensive interpretation of multiple databases.   Critical Care Time devoted to patient care services described in this note is  35  Minutes. This time reflects time of care of this signee Dr Jennet Maduro. This critical care time does not reflect procedure time, or teaching time or supervisory time of PA/NP/Med student/Med Resident etc but could involve care discussion time.  Rush Farmer, M.D. [facility] Pulmonary/Critical Care Medicine. Pager: [phone]. After hours pager: [phone].

## 2015-10-08 NOTE — Progress Notes (Signed)
Generalized Edema from Nonpitting to 2+. Througout the body open and closed scabs, mostly extremities, (some of which are weeping pus, bloody drainage), redness, bruising, track marks, scratches. MD aware

## 2015-10-08 NOTE — Progress Notes (Signed)
STROKE TEAM PROGRESS NOTE   SUBJECTIVE (INTERVAL HISTORY) No family members present.Discuss case with RN and bedside and with Dr. Molli Knock pulmonary critical care medicine   OBJECTIVE Temp:  [97.9 F (36.6 C)-101.6 F (38.7 C)] 101.6 F (38.7 C) (04/01 0839) Pulse Rate:  [48-98] 85 (04/01 1200) Cardiac Rhythm:  [-] Normal sinus rhythm;Sinus tachycardia (03/31 2000) Resp:  [0-40] 25 (04/01 1200) BP: (93-135)/(54-86) 124/67 mmHg (04/01 1200) SpO2:  [93 %-100 %] 97 % (04/01 1200) FiO2 (%):  [40 %] 40 % (04/01 0740) Weight:  [55.1 kg (121 lb 7.6 oz)] 55.1 kg (121 lb 7.6 oz) (04/01 0500)  CBC:   Recent Labs Lab 10/07/15 0726 10/08/15 0559  WBC 9.3 9.6  NEUTROABS 8.1* 7.5  HGB 7.4* 7.8*  HCT 23.6* 25.4*  MCV 80.5 82.2  PLT 63* 70*    Basic Metabolic Panel:   Recent Labs Lab 10/07/15 0726 10/07/15 2035 10/08/15 0559  NA 145  --  145  K 3.9  --  3.3*  CL 122*  --  116*  CO2 16*  --  21*  GLUCOSE 118*  --  100*  BUN 48*  --  41*  CREATININE 1.04*  --  0.91  CALCIUM 7.8*  --  7.8*  MG 2.4 2.6*  --   PHOS 4.6 4.5  --     Lipid Panel:     Component Value Date/Time   CHOL 102 10/06/2015 1539   TRIG 193* 10/06/2015 1539   HDL <10* 10/06/2015 1539   CHOLHDL NOT CALCULATED 10/06/2015 1539   VLDL 39 10/06/2015 1539   LDLCALC NOT CALCULATED 10/06/2015 1539   HgbA1c:  Lab Results  Component Value Date   HGBA1C 5.7* 10/06/2015   Urine Drug Screen:     Component Value Date/Time   LABOPIA POSITIVE* 10/04/2015 0908   COCAINSCRNUR POSITIVE* 10/04/2015 0908   LABBENZ NONE DETECTED 10/04/2015 0908   AMPHETMU NONE DETECTED 10/04/2015 0908   THCU NONE DETECTED 10/04/2015 0908   LABBARB NONE DETECTED 10/04/2015 0908      IMAGING I have personally reviewed the radiological images below and agree with the radiology interpretations.  Ct Head Wo Contrast  10/04/2015  IMPRESSION: Suggestion of decreased attenuation involving the right external capsule and right temporal  lobe, which may reflect an evolving acute right-sided infarct. No significant mass effect or midline shift seen.   10/06/15   Mr Brain Wo Contrast  10/04/2015  IMPRESSION: 1. Acute/subacute large right MCA territory nonhemorrhagic infarct. 2. Additional foci of acute nonhemorrhagic infarct involves the right occipital lobe, left occipital parietal lobe, in the left cerebellum. 3. No other significant white matter disease or evidence for chronic abnormality is present.   Dg Chest Port 1 View  10/04/2015  IMPRESSION: Patchy bilateral airspace opacities raise concern for multifocal pneumonia.   2D Echocardiogram  - Left ventricle: The cavity size was normal. Systolic function was normal. The estimated ejection fraction was in the range of 50% to 55%. Wall motion was normal; there were no regional wall motion abnormalities. Left ventricular diastolic function parameters were normal. - Aortic valve: There was a vegetation measuring 0.66 cm x 0.52 cm on the noncoronary cusp. Transvalvular velocity was within the normal range. There was no stenosis. There was no regurgitation. - Mitral valve: There was trivial regurgitation. - Right ventricle: The cavity size was normal. Wall thickness was normal. Systolic function was normal. - Atrial septum: No defect or patent foramen ovale was identified by color flow Doppler. - Tricuspid valve:  There was a mobile, 0.9 cm (W) x 1.3 cm (L) vegetation on the atrial side of the septal leaflet. There was also a 2.6 cm x 0.8 cm mobile vegetation on the ventricular side of the anterior leaflet. There was moderate regurgitation. - Pulmonary arteries: Systolic pressure was within the normal range. PA peak pressure: 25 mm Hg (S). - Pericardium, extracardiac: A small pericardial effusion was identified. 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  MRA head and neck 10/08/2015  MRI HEAD: No new infarcts. Evolving large  acute RIGHT MCA territory infarct without hemorrhagic conversion or propagation. Evolving small bilateral occipital lobe infarcts.  MRA HEAD: Mildly motion degraded examination. Acute RIGHT distal M1 occlusion consistent with embolic phenomena.  MRA NECK: Mildly motion degraded negative examination.  There is no evidence for atherosclerosis or flow limiting stenosis.    MRI brain with and without contrast  10/08/2015 No new infarcts. Evolving large acute RIGHT MCA territory infarct without hemorrhagic conversion or propagation. Evolving small bilateral occipital lobe infarcts.    EEG Abnormal awake and drowsy routine inpatient EEG suggestive of mild generalized encephalopathy with superimposed focal neuronal dysfunction in the right frontocentral parietal region, expected from the known right MCA infarct. Clinical correlation is recommended .    PHYSICAL EXAM  Temp:  [97.9 F (36.6 C)-101.6 F (38.7 C)] 101.6 F (38.7 C) (04/01 0839) Pulse Rate:  [48-98] 85 (04/01 1200) Resp:  [0-40] 25 (04/01 1200) BP: (93-135)/(54-86) 124/67 mmHg (04/01 1200) SpO2:  [93 %-100 %] 97 % (04/01 1200) FiO2 (%):  [40 %] 40 % (04/01 0740) Weight:  [55.1 kg (121 lb 7.6 oz)] 55.1 kg (121 lb 7.6 oz) (04/01 0500)  General - thin built, well developed, intubated.  Ophthalmologic - Fundi not visualized due to ET positioning.  Cardiovascular - Regular rate and rhythm.  Neuro - intubated on propofol, not responding to voice, not following commands. PERRL, minimal doll's eyes, left corneal absent, right corneal weak reflex, positive gag and cough. On pain stimulation, right LE minimal withdraw, not movement at other extremities. DTR diminished and no babinski. Sensation, coordination and gait not tested.     ASSESSMENT/PLAN Cassandra Wall is a 35 y.o. female with history of polysubstance abuse, smoker, hepC positive, CKD, seizure, and anxiety presenting with left sided weakness. She did not  receive IV t-PA due to delay in arrival.   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  lovenox for VTE prophylaxis Diet NPO time specified  No antithrombotic prior to admission, now on No antithrombotic. No antithrombotic indicated for endocarditis.  Therapy recommendations:  pending  Disposition:  Pending  ? 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   UDS positive for opiates and cocaine this admission  Anemia  Hgb 8.6 -> 9.0 -> 7.1->7.4  CBC monitoring  Tobacco abuse  Current smoker  Smoking cessation counseling provided  Other Stroke Risk Factors  Occasional ETOH use  Migraines  Other Active Problems  Hx psychosis  Hx seizures  Acute renal failure Cr 4.5 -> 3.88 ->1.41 -> 1.28 ->1.04  Thrombocytopenia  Prolonged Q-T interval  hypothermia  Hospital day # 4  12:07 PM  Per Dr. Pearlean BrownieSethi - The stroke team will sign off at this time. New imaging findings as noted above. No indication for hypertonic saline at  this time. Please call if we can be of further service.  Delton Seeavid Rinehuls PA-C [facility] Pager [phone] 10/08/2015, 12:15 PM I have personally examined this patient, reviewed notes, independently viewed imaging studies, participated in medical decision making and plan of care. I have made any additions or clarifications directly to the above note. Agree with note above. Discuss with Dr. Molli KnockYacoub. Patient unfortunately is not medically stable to be candidate for tricuspid valve replacement. She remains at risk for recurrent septic emboli, strokes, neurological worsening, seizures, heart failure valve rupture and death Continue antibiotic treatment for endocarditis. Stroke team will sign off. Kindly call for questions.  Delia HeadyPramod Ashyia Schraeder, MD Medical Director [facility] Pager: [phone] 10/08/2015 12:46 PM   To contact Stroke Continuity provider, please refer to [email or web address]. After hours, contact General Neurology

## 2015-10-08 NOTE — Progress Notes (Signed)
Transported patient to MRI with no complications.

## 2015-10-09 ENCOUNTER — Inpatient Hospital Stay (HOSPITAL_COMMUNITY): Payer: Medicaid Other

## 2015-10-09 LAB — PHOSPHORUS: Phosphorus: 4.1 mg/dL (ref 2.5–4.6)

## 2015-10-09 LAB — COMPREHENSIVE METABOLIC PANEL
ALBUMIN: 1.5 g/dL — AB (ref 3.5–5.0)
ALK PHOS: 79 U/L (ref 38–126)
ALT: 17 U/L (ref 14–54)
ANION GAP: 8 (ref 5–15)
AST: 22 U/L (ref 15–41)
BILIRUBIN TOTAL: 0.8 mg/dL (ref 0.3–1.2)
BUN: 24 mg/dL — AB (ref 6–20)
CALCIUM: 7.7 mg/dL — AB (ref 8.9–10.3)
CO2: 24 mmol/L (ref 22–32)
CREATININE: 0.59 mg/dL (ref 0.44–1.00)
Chloride: 113 mmol/L — ABNORMAL HIGH (ref 101–111)
GFR calc Af Amer: >60 mL/min (ref >60)
GFR calc non Af Amer: >60 mL/min (ref >60)
GLUCOSE: 108 mg/dL — AB (ref 65–99)
Potassium: 3.6 mmol/L (ref 3.5–5.1)
Sodium: 145 mmol/L (ref 135–145)
TOTAL PROTEIN: 5.6 g/dL — AB (ref 6.5–8.1)

## 2015-10-09 LAB — TRIGLYCERIDES: Triglycerides: 76 mg/dL (ref <150)

## 2015-10-09 LAB — CBC
HEMATOCRIT: 24.7 % — AB (ref 36.0–46.0)
HEMOGLOBIN: 7.6 g/dL — AB (ref 12.0–15.0)
MCH: 25.8 pg — ABNORMAL LOW (ref 26.0–34.0)
MCHC: 30.8 g/dL (ref 30.0–36.0)
MCV: 83.7 fL (ref 78.0–100.0)
Platelets: 98 10*3/uL — ABNORMAL LOW (ref 150–400)
RBC: 2.95 MIL/uL — AB (ref 3.87–5.11)
RDW: 15.6 % — ABNORMAL HIGH (ref 11.5–15.5)
WBC: 11.8 10*3/uL — ABNORMAL HIGH (ref 4.0–10.5)

## 2015-10-09 LAB — GLUCOSE, CAPILLARY
GLUCOSE-CAPILLARY: 100 mg/dL — AB (ref 65–99)
GLUCOSE-CAPILLARY: 108 mg/dL — AB (ref 65–99)
Glucose-Capillary: 114 mg/dL — ABNORMAL HIGH (ref 65–99)
Glucose-Capillary: 83 mg/dL (ref 65–99)
Glucose-Capillary: 95 mg/dL (ref 65–99)
Glucose-Capillary: 99 mg/dL (ref 65–99)

## 2015-10-09 LAB — ANAEROBIC CULTURE

## 2015-10-09 LAB — MAGNESIUM: Magnesium: 1.7 mg/dL (ref 1.7–2.4)

## 2015-10-09 MED ORDER — POTASSIUM CHLORIDE CRYS ER 20 MEQ PO TBCR
20.0000 meq | EXTENDED_RELEASE_TABLET | ORAL | Status: AC
  Administered 2015-10-09 (×2): 20 meq via ORAL
  Filled 2015-10-09 (×2): qty 1

## 2015-10-09 MED ORDER — FENTANYL CITRATE (PF) 2500 MCG/50ML IJ SOLN
100.0000 ug/h | INTRAMUSCULAR | Status: DC
  Administered 2015-10-09 – 2015-10-11 (×7): 400 ug/h via INTRAVENOUS
  Administered 2015-10-11: 50 ug/h via INTRAVENOUS
  Administered 2015-10-11 – 2015-10-12 (×3): 400 ug/h via INTRAVENOUS
  Filled 2015-10-09 (×12): qty 50

## 2015-10-09 MED ORDER — MIDAZOLAM HCL 2 MG/2ML IJ SOLN: 2.0000 mg | Freq: Once | INTRAMUSCULAR | Status: AC

## 2015-10-09 MED ORDER — CLONAZEPAM 0.1 MG/ML ORAL SUSPENSION: 1.0000 mg | ORAL | Status: DC

## 2015-10-09 MED ORDER — FREE WATER
200.0000 mL | Freq: Four times a day (QID) | Status: DC
  Administered 2015-10-09 – 2015-10-10 (×4): 200 mL

## 2015-10-09 MED ORDER — FENTANYL CITRATE (PF) 100 MCG/2ML IJ SOLN
INTRAMUSCULAR | Status: AC
  Filled 2015-10-09: qty 2

## 2015-10-09 MED ORDER — FENTANYL CITRATE (PF) 100 MCG/2ML IJ SOLN
25.0000 ug | INTRAMUSCULAR | Status: DC | PRN
  Administered 2015-10-09 (×2): 100 ug via INTRAVENOUS
  Administered 2015-10-14: 50 ug via INTRAVENOUS
  Administered 2015-10-14: 25 ug via INTRAVENOUS
  Administered 2015-10-16: 100 ug via INTRAVENOUS
  Administered 2015-10-17 (×3): 50 ug via INTRAVENOUS
  Administered 2015-10-18: 100 ug via INTRAVENOUS
  Administered 2015-10-18 (×2): 50 ug via INTRAVENOUS
  Administered 2015-10-19 (×2): 100 ug via INTRAVENOUS
  Administered 2015-10-19: 75 ug via INTRAVENOUS
  Administered 2015-10-24 (×2): 50 ug via INTRAVENOUS
  Administered 2015-10-24 – 2015-10-25 (×4): 100 ug via INTRAVENOUS
  Administered 2015-10-26: 25 ug via INTRAVENOUS
  Filled 2015-10-09 (×12): qty 2

## 2015-10-09 MED ORDER — SODIUM CHLORIDE 0.9 % IV SOLN
1.0000 mg/h | INTRAVENOUS | Status: DC
  Administered 2015-10-09: 2 mg/h via INTRAVENOUS
  Administered 2015-10-10: 10 mg/h via INTRAVENOUS
  Filled 2015-10-09 (×3): qty 10

## 2015-10-09 MED ORDER — CLONAZEPAM 0.5 MG PO TABS
1.0000 mg | ORAL_TABLET | Freq: Three times a day (TID) | ORAL | Status: DC
  Administered 2015-10-09 – 2015-10-18 (×22): 1 mg
  Filled 2015-10-09 (×23): qty 2

## 2015-10-09 MED ORDER — MIDAZOLAM HCL 2 MG/2ML IJ SOLN
INTRAMUSCULAR | Status: AC
  Administered 2015-10-09: 2 mg
  Filled 2015-10-09: qty 2

## 2015-10-09 MED ORDER — FENTANYL CITRATE (PF) 100 MCG/2ML IJ SOLN
100.0000 ug | Freq: Once | INTRAMUSCULAR | Status: AC
  Administered 2015-10-09: 100 ug via INTRAVENOUS

## 2015-10-09 MED ORDER — DEXMEDETOMIDINE HCL IN NACL 400 MCG/100ML IV SOLN
0.4000 ug/kg/h | INTRAVENOUS | Status: DC
  Administered 2015-10-09 (×3): 1.8 ug/kg/h via INTRAVENOUS
  Administered 2015-10-09 – 2015-10-14 (×2): 0.4 ug/kg/h via INTRAVENOUS
  Administered 2015-10-15: 1.5 ug/kg/h via INTRAVENOUS
  Administered 2015-10-15: 0.7 ug/kg/h via INTRAVENOUS
  Administered 2015-10-15 (×2): 1 ug/kg/h via INTRAVENOUS
  Administered 2015-10-16 – 2015-10-17 (×10): 1.4 ug/kg/h via INTRAVENOUS
  Administered 2015-10-18: 1.2 ug/kg/h via INTRAVENOUS
  Administered 2015-10-18: 1 ug/kg/h via INTRAVENOUS
  Administered 2015-10-18: 1.3 ug/kg/h via INTRAVENOUS
  Administered 2015-10-18 – 2015-10-19 (×2): 1.2 ug/kg/h via INTRAVENOUS
  Administered 2015-10-19 (×2): 1 ug/kg/h via INTRAVENOUS
  Administered 2015-10-20: 1.2 ug/kg/h via INTRAVENOUS
  Administered 2015-10-20: 0.8 ug/kg/h via INTRAVENOUS
  Administered 2015-10-20: 1.2 ug/kg/h via INTRAVENOUS
  Administered 2015-10-20: 1 ug/kg/h via INTRAVENOUS
  Administered 2015-10-21: 1.2 ug/kg/h via INTRAVENOUS
  Administered 2015-10-21: 1 ug/kg/h via INTRAVENOUS
  Administered 2015-10-21: 1.1 ug/kg/h via INTRAVENOUS
  Administered 2015-10-21 (×2): 1.2 ug/kg/h via INTRAVENOUS
  Administered 2015-10-22: 0.8 ug/kg/h via INTRAVENOUS
  Administered 2015-10-22: 1.1 ug/kg/h via INTRAVENOUS
  Administered 2015-10-23: 0.8 ug/kg/h via INTRAVENOUS
  Administered 2015-10-23: 1 ug/kg/h via INTRAVENOUS
  Filled 2015-10-09 (×18): qty 100
  Filled 2015-10-09: qty 50
  Filled 2015-10-09 (×2): qty 100
  Filled 2015-10-09: qty 50
  Filled 2015-10-09 (×23): qty 100

## 2015-10-09 NOTE — Progress Notes (Signed)
Wedding ring was given to mom Jacki ConesLaurie.  Her mother took the ring home.

## 2015-10-09 NOTE — Plan of Care (Signed)
Problem: Pain Managment: Goal: General experience of comfort will improve Outcome: Progressing Patient sedated. Pain and comfort controled  Problem: Physical Regulation: Goal: Will remain free from infection Outcome: Not Progressing Temp 101, Tylenol given, Signs of infection in multiple place where scabs are  Problem: Skin Integrity: Goal: Risk for impaired skin integrity will decrease Outcome: Not Progressing Scratches, scabs look red, with weeping pus at times . MD aware  Problem: Nutrition: Goal: Adequate nutrition will be maintained Outcome: Progressing Tube feeds

## 2015-10-09 NOTE — Progress Notes (Signed)
PULMONARY / CRITICAL CARE MEDICINE   Name: Cassandra Wall MRN: [id number] DOB: Dec 27, 1980    ADMISSION DATE:  10/04/2015 CONSULTATION DATE:  10/06/15  REFERRING MD:  Patel,P  CHIEF COMPLAINT: Hypertensive urgency related to opioid withdrawl  HISTORY OF PRESENT ILLNESS:  Cassandra Wall is a 35 year old female with past medical history significant for polysubstance abuse(iv heroine and cocaine), tobacco abuse who presented to the ED with complaints of left sided weakness which was going on for two days prior to the admission.  CT of the brain was concerning for right sided infarct She was evaluated by neurology and they think it is septic emboli from IVDU. She has track marks all over her body. From her note it appears that her brother passed away with endocarditis due to IVDU. Rt side endocarditis. vent  SUBJECTIVE:  Intubated, 1 unit prbc  VITAL SIGNS: BP 115/67 mmHg  Pulse 96  Temp(Src) 101.3 F (38.5 C) (Oral)  Resp 23  Ht 5' 8"  (1.727 m)  Wt 159 lb 6.3 oz (72.3 kg)  BMI 24.24 kg/m2  SpO2 97%  HEMODYNAMICS:    VENTILATOR SETTINGS: Vent Mode:  [-] PSV;CPAP FiO2 (%):  [40 %] 40 % Set Rate:  [14 bmp] 14 bmp Vt Set:  [490 mL] 490 mL PEEP:  [5 cmH20] 5 cmH20 Pressure Support:  [5 cmH20] 5 cmH20 Plateau Pressure:  [13 cmH20-20 cmH20] 20 cmH20  INTAKE / OUTPUT: I/O last 3 completed shifts: In: 6855.3 [I.V.:3804; NG/GT:2151.3; IV Piggyback:900] Out: 2860 [Urine:2860]  PHYSICAL EXAMINATION: General:  Sedated on vent. No distress on SBT Neuro:  rass -3, left sided weakness, we are transitioning sedation   HEENT:  Atraumatic, normocephalic, no discharge Cardiovascular: regular. No MRG Lungs:  Ronchi, equal chest rise  Abdomen:  Tender on light palpation, no r/g, BS wnl Musculoskeletal:  No joint deformity Skin: track marks all over the body  *LABS:  BMET  Recent Labs Lab 10/07/15 0726 10/08/15 0559 10/09/15 0450  NA 145 145 145  K 3.9 3.3* 3.6  CL  122* 116* 113*  CO2 16* 21* 24  BUN 48* 41* 24*  CREATININE 1.04* 0.91 0.59  GLUCOSE 118* 100* 108*    Electrolytes  Recent Labs Lab 10/07/15 0726  10/08/15 0559 10/08/15 1153 10/08/15 2131 10/09/15 0450 10/09/15 0857  CALCIUM 7.8*  --  7.8*  --   --  7.7*  --   MG 2.4  < >  --  2.0 1.7  --  1.7  PHOS 4.6  < >  --  4.4 4.6  --  4.1  < > = values in this interval not displayed.  CBC  Recent Labs Lab 10/07/15 0726 10/08/15 0559 10/09/15 0450  WBC 9.3 9.6 11.8*  HGB 7.4* 7.8* 7.6*  HCT 23.6* 25.4* 24.7*  PLT 63* 70* 98*    Coag's  Recent Labs Lab 10/04/15 0445 10/04/15 0941 10/06/15 0832 10/07/15 0726  APTT 31 35  --   --   INR 1.31 1.39 1.29 1.45    Sepsis Markers  Recent Labs Lab 10/04/15 1232 10/06/15 1539 10/06/15 2248  LATICACIDVEN 0.9 2.6* 1.7  PROCALCITON 21.66  --   --     ABG  Recent Labs Lab 10/06/15 1255 10/06/15 2110 10/07/15 1217  PHART 7.416 7.316* 7.364  PCO2ART 17.9* 25.6* 29.3*  PO2ART 107* 129.0* 82.0    Liver Enzymes  Recent Labs Lab 10/07/15 0726 10/08/15 0559 10/09/15 0450  AST 27 25 22   ALT 16 15 17  ALKPHOS 90 83 79  BILITOT 1.3* 0.8 0.8  ALBUMIN 1.6* 1.6* 1.5*    Cardiac Enzymes No results for input(s): TROPONINI, PROBNP in the last 168 hours.  Glucose  Recent Labs Lab 10/08/15 1259 10/08/15 1623 10/08/15 1937 10/08/15 2354 10/09/15 0410 10/09/15 0841  GLUCAP 100* 90 87 83 95 100*    Imaging No results found.remarkably worse on the left today   STUDIES:  3/293/30 US renal . No hydronephrosis.2. Increased parenchymal echogenicity bilaterally compatible with chronic medical renal disease. 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 No significant right-to-left shift or hemorrhagic transformation.BILATERAL occipital lobe hypodensities, subcentimeter size, could represent brain abscesses (versus acute subcortical infarcts).  Consider continued surveillance with post infusion MRI. 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 EF of 50-55%  CULTURES:  3/28 BC > mssa 3/30>>>POS again  3/28 CSF-normal  ANTIBIOTICS: Naficillin 3/30>>>  SIGNIFICANT EVENTS: NONE  LINES/TUBES: 3/30 ETT>>> 3/30 line >>>out   DISCUSSION: 35 year old female with h/o polysubstance abuse now presents with right sided septic emboli ddue to IVDU. Major issues are debilitation s/p CVA, agitation, NAG metabolic acidosis.This has improved. We will cont supportive care. Transition to precedex. Hope we may be able to extubate her soon.   ASSESSMENT / PLAN:  PULMONARY A: Acute Hypoxemic respiratory failure Multifocal PNA due to septic emboli Worsening airspace disease on left  Iatrogenic respiratory alk  P:   Full vent support Wean as tolerated.  If her MS supports will extubate on precedex Cont supportive care  CARDIOVASCULAR A:  Endocarditis due to IVDU P:  Not a candidate for surgery, cvts following Cardiology following the patient  RENAL A:   AKI Hyperchloremia -->improved  Hypokalemia  NONAG acidosis from saline P:   Cont bicarb gtt @ KVO  Replace K  Serial chemistry  strick I&O Replace free water  GASTROINTESTINAL A:   ?mesentric ischemia possibly due to IVDU P:   tolerating feeds Protonix for GIP  HEMATOLOGIC A:   Anemia Thrombocytopenia r/t sepsis   P:  CBC in am scds for DVT prophylaxis Heparin sq held  INFECTIOUS A:   MSSA endocarditis with septic emboli Suspected cellulitis involving the drug injection site P:   No role pct at all  Continue naficillin as per ID, and assess if can clear this Repeat BC  Repeat respiratory culture w/ worsening airspace disease.   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  Encephalopathy  P:   RASS goal: -2 Will change to  precedex to see if it will facilitate wean  Spoke to Neuro. W/ current MS no role for 3% Will follow neuro-status and re-involve if needed.    FAMILY  - Updates: no family present at bedside   - Inter-disciplinary family meet or Palliative Care meeting due by: 10/13/15  Erick Colace ACNP-BC [place] Pager # [phone] OR # [phone] if no answer  Attending Note:  35 year old female with IVDA, endocarditis and septic emboli to the lungs and brain. Patient is unresponsive. On exam, lungs with diffuse crackles. I reviewed CXR myself, diffuse infiltrate noted. Discussed with neuro MD, they are signing off, there is little to be done in this case unfortunately. Needs decision on trach/peg vs one way extubation.  Arrange for family meeting in AM for plan of care.  In the meantime, continue abx and continue to monitor in the ICU.  Hold weaning today since she failed (either sedated to the point of apnea or extremely agitated) however is improving day by day.  The patient is critically ill with multiple organ systems failure and requires high complexity decision making for assessment and support, frequent evaluation and titration of therapies, application of advanced monitoring technologies and extensive interpretation of multiple databases.   Critical Care Time devoted to patient care services described in this note is 35 Minutes. This time reflects time of care of this signee Dr Jennet Maduro. This critical care time does not reflect procedure time, or teaching time or supervisory time of PA/NP/Med student/Med Resident etc but could involve care discussion time.  Rush Farmer, M.D. [facility] Pulmonary/Critical Care Medicine. Pager: [phone]. After hours pager: [phone].

## 2015-10-09 NOTE — Progress Notes (Signed)
eLink Nursing ICU Electrolyte Replacement Protocol  Patient Name: Cassandra ChouSamantha Wall DOB: Dec 28, 1980 MRN: [id number]  Date of Service  10/09/2015   HPI/Events of Note    Recent Labs Lab 10/06/15 1540 10/06/15 2245 10/07/15 0726 10/07/15 2035 10/08/15 0559 10/08/15 1153 10/08/15 2131 10/09/15 0450  NA 148* 145 145  --  145  --   --  145  K 3.8 3.9 3.9  --  3.3*  --   --  3.6  CL 124* 122* 122*  --  116*  --   --  113*  CO2 13* 14* 16*  --  21*  --   --  24  GLUCOSE 99 99 118*  --  100*  --   --  108*  BUN 53* 49* 48*  --  41*  --   --  24*  CREATININE 1.28* 1.06* 1.04*  --  0.91  --   --  0.59  CALCIUM 7.8* 7.7* 7.8*  --  7.8*  --   --  7.7*  MG 2.5*  --  2.4 2.6*  --  2.0 1.7  --   PHOS 3.6  --  4.6 4.5  --  4.4 4.6  --     Estimated Creatinine Clearance: 100.9 mL/min (by C-G formula based on Cr of 0.59).  Intake/Output      04/01 0701 - 04/02 0700   I.V. (mL/kg) 2034 (28.1)   NG/GT 881.3   IV Piggyback 500   Total Intake(mL/kg) 3415.4 (47.2)   Urine (mL/kg/hr) 1800 (1)   Total Output 1800   Net +1615.4        - I/O DETAILED x24h    Total I/O In: 1259.7 [I.V.:803.7; NG/GT:356; IV Piggyback:100] Out: 400 [Urine:400] - I/O THIS SHIFT    ASSESSMENT   eICURN Interventions  K+ replaced using electrolyte protocol. MD informed   ASSESSMENT: MAJOR ELECTROLYTE    Merita NortonFrye, Jernard Reiber Nicole 10/09/2015, 5:49 AM

## 2015-10-09 NOTE — Progress Notes (Signed)
eLink Physician-Brief Progress Note Patient Name: Cassandra ChouSamantha Wall DOB: [birth year] MRN: [id number]   Date of Service  10/09/2015  HPI/Events of Note  Patient with significant restlessness and agitation not controlled with precedex and fentanyl prn.  Nurses feel patient not safe.  eICU Interventions  precedex drip to be stopped  Start fentanyl and versed drips Stat cxr for increased work of breathing      Intervention Category Minor Interventions: Agitation / anxiety - evaluation and management  Henry RusselSMITH, Lada Fulbright, P 10/09/2015, 9:23 PM

## 2015-10-10 ENCOUNTER — Inpatient Hospital Stay (HOSPITAL_COMMUNITY): Payer: Medicaid Other

## 2015-10-10 DIAGNOSIS — J189 Pneumonia, unspecified organism: Secondary | ICD-10-CM | POA: Insufficient documentation

## 2015-10-10 DIAGNOSIS — R6521 Severe sepsis with septic shock: Secondary | ICD-10-CM

## 2015-10-10 DIAGNOSIS — A4101 Sepsis due to Methicillin susceptible Staphylococcus aureus: Secondary | ICD-10-CM | POA: Diagnosis present

## 2015-10-10 DIAGNOSIS — F19288 Other psychoactive substance dependence with other psychoactive substance-induced disorder: Secondary | ICD-10-CM

## 2015-10-10 DIAGNOSIS — Z9911 Dependence on respirator [ventilator] status: Secondary | ICD-10-CM

## 2015-10-10 DIAGNOSIS — R509 Fever, unspecified: Secondary | ICD-10-CM

## 2015-10-10 DIAGNOSIS — A419 Sepsis, unspecified organism: Secondary | ICD-10-CM | POA: Diagnosis present

## 2015-10-10 DIAGNOSIS — G060 Intracranial abscess and granuloma: Secondary | ICD-10-CM | POA: Insufficient documentation

## 2015-10-10 DIAGNOSIS — I748 Embolism and thrombosis of other arteries: Secondary | ICD-10-CM

## 2015-10-10 LAB — URINALYSIS W MICROSCOPIC (NOT AT ARMC)
BILIRUBIN URINE: NEGATIVE
Glucose, UA: NEGATIVE mg/dL
KETONES UR: NEGATIVE mg/dL
Nitrite: NEGATIVE
Protein, ur: 30 mg/dL — AB
Specific Gravity, Urine: 1.022 (ref 1.005–1.030)
pH: 6 (ref 5.0–8.0)

## 2015-10-10 LAB — COMPREHENSIVE METABOLIC PANEL
ALT: 17 U/L (ref 14–54)
AST: 27 U/L (ref 15–41)
Albumin: 1.3 g/dL — ABNORMAL LOW (ref 3.5–5.0)
Alkaline Phosphatase: 65 U/L (ref 38–126)
Anion gap: 6 (ref 5–15)
BUN: 24 mg/dL — AB (ref 6–20)
CHLORIDE: 113 mmol/L — AB (ref 101–111)
CO2: 25 mmol/L (ref 22–32)
Calcium: 7.6 mg/dL — ABNORMAL LOW (ref 8.9–10.3)
Creatinine, Ser: 0.58 mg/dL (ref 0.44–1.00)
GFR calc Af Amer: >60 mL/min (ref >60)
Glucose, Bld: 102 mg/dL — ABNORMAL HIGH (ref 65–99)
POTASSIUM: 3.7 mmol/L (ref 3.5–5.1)
SODIUM: 144 mmol/L (ref 135–145)
Total Bilirubin: 0.8 mg/dL (ref 0.3–1.2)
Total Protein: 5.1 g/dL — ABNORMAL LOW (ref 6.5–8.1)

## 2015-10-10 LAB — GLUCOSE, CAPILLARY
GLUCOSE-CAPILLARY: 101 mg/dL — AB (ref 65–99)
GLUCOSE-CAPILLARY: 82 mg/dL (ref 65–99)
GLUCOSE-CAPILLARY: 89 mg/dL (ref 65–99)
GLUCOSE-CAPILLARY: 99 mg/dL (ref 65–99)
Glucose-Capillary: 102 mg/dL — ABNORMAL HIGH (ref 65–99)
Glucose-Capillary: 89 mg/dL (ref 65–99)

## 2015-10-10 LAB — CBC WITH DIFFERENTIAL/PLATELET
BASOS ABS: 0 10*3/uL (ref 0.0–0.1)
Basophils Relative: 0 %
EOS PCT: 1 %
Eosinophils Absolute: 0.1 10*3/uL (ref 0.0–0.7)
HEMATOCRIT: 23.7 % — AB (ref 36.0–46.0)
HEMOGLOBIN: 7.6 g/dL — AB (ref 12.0–15.0)
LYMPHS ABS: 1.4 10*3/uL (ref 0.7–4.0)
Lymphocytes Relative: 11 %
MCH: 27.1 pg (ref 26.0–34.0)
MCHC: 32.1 g/dL (ref 30.0–36.0)
MCV: 84.6 fL (ref 78.0–100.0)
MONO ABS: 0.8 10*3/uL (ref 0.1–1.0)
Monocytes Relative: 6 %
Neutro Abs: 10.5 10*3/uL — ABNORMAL HIGH (ref 1.7–7.7)
Neutrophils Relative %: 82 %
Platelets: 114 10*3/uL — ABNORMAL LOW (ref 150–400)
RBC: 2.8 MIL/uL — ABNORMAL LOW (ref 3.87–5.11)
RDW: 15.7 % — ABNORMAL HIGH (ref 11.5–15.5)
WBC: 12.8 10*3/uL — AB (ref 4.0–10.5)

## 2015-10-10 LAB — POTASSIUM
POTASSIUM: 3.5 mmol/L (ref 3.5–5.1)
Potassium: 4.6 mmol/L (ref 3.5–5.1)

## 2015-10-10 LAB — OCCULT BLOOD X 1 CARD TO LAB, STOOL: Fecal Occult Bld: POSITIVE — AB

## 2015-10-10 LAB — CBC
HCT: 20.6 % — ABNORMAL LOW (ref 36.0–46.0)
Hemoglobin: 6.5 g/dL — CL (ref 12.0–15.0)
MCH: 26.6 pg (ref 26.0–34.0)
MCHC: 31.6 g/dL (ref 30.0–36.0)
MCV: 84.4 fL (ref 78.0–100.0)
PLATELETS: 98 10*3/uL — AB (ref 150–400)
RBC: 2.44 MIL/uL — ABNORMAL LOW (ref 3.87–5.11)
RDW: 15.6 % — AB (ref 11.5–15.5)
WBC: 10.7 10*3/uL — ABNORMAL HIGH (ref 4.0–10.5)

## 2015-10-10 LAB — PREPARE RBC (CROSSMATCH)

## 2015-10-10 MED ORDER — MAGNESIUM SULFATE 2 GM/50ML IV SOLN
2.0000 g | Freq: Once | INTRAVENOUS | Status: AC
  Administered 2015-10-10: 2 g via INTRAVENOUS
  Filled 2015-10-10: qty 50

## 2015-10-10 MED ORDER — VECURONIUM BROMIDE 10 MG IV SOLR
5.0000 mg | Freq: Once | INTRAVENOUS | Status: AC
  Administered 2015-10-10: 5 mg via INTRAVENOUS

## 2015-10-10 MED ORDER — POTASSIUM CHLORIDE 10 MEQ/100ML IV SOLN: 10.0000 meq | INTRAVENOUS | Status: DC

## 2015-10-10 MED ORDER — POTASSIUM CHLORIDE 10 MEQ/50ML IV SOLN
10.0000 meq | INTRAVENOUS | Status: AC
  Administered 2015-10-10 (×3): 10 meq via INTRAVENOUS
  Filled 2015-10-10 (×4): qty 50

## 2015-10-10 MED ORDER — POTASSIUM CHLORIDE 20 MEQ/15ML (10%) PO SOLN
40.0000 meq | Freq: Once | ORAL | Status: AC
  Administered 2015-10-10: 40 meq
  Filled 2015-10-10: qty 30

## 2015-10-10 MED ORDER — DIPHENHYDRAMINE HCL 50 MG/ML IJ SOLN
50.0000 mg | Freq: Once | INTRAMUSCULAR | Status: AC
  Administered 2015-10-10: 50 mg via INTRAVENOUS
  Filled 2015-10-10: qty 1

## 2015-10-10 MED ORDER — POTASSIUM CHLORIDE 20 MEQ/15ML (10%) PO SOLN
30.0000 meq | Freq: Once | ORAL | Status: AC
  Administered 2015-10-10: 30 meq
  Filled 2015-10-10: qty 30

## 2015-10-10 MED ORDER — QUETIAPINE FUMARATE 50 MG PO TABS
50.0000 mg | ORAL_TABLET | Freq: Two times a day (BID) | ORAL | Status: DC
  Administered 2015-10-10 – 2015-10-18 (×14): 50 mg via ORAL
  Filled 2015-10-10 (×17): qty 1

## 2015-10-10 MED ORDER — SODIUM CHLORIDE 0.9 % IV SOLN
1.0000 mg/h | INTRAVENOUS | Status: DC
  Administered 2015-10-10 (×2): 10 mg/h via INTRAVENOUS
  Administered 2015-10-11: 1 mg/h via INTRAVENOUS
  Administered 2015-10-11: 10 mg/h via INTRAVENOUS
  Administered 2015-10-11: 1 mg/h via INTRAVENOUS
  Administered 2015-10-11 – 2015-10-13 (×6): 10 mg/h via INTRAVENOUS
  Filled 2015-10-10 (×10): qty 20

## 2015-10-10 MED ORDER — SODIUM CHLORIDE 0.9 % IV SOLN: Freq: Once | INTRAVENOUS | Status: DC

## 2015-10-10 MED ORDER — FUROSEMIDE 10 MG/ML IJ SOLN
40.0000 mg | Freq: Once | INTRAMUSCULAR | Status: AC
  Administered 2015-10-11: 40 mg via INTRAVENOUS
  Filled 2015-10-10: qty 4

## 2015-10-10 MED ORDER — VECURONIUM BROMIDE 10 MG IV SOLR
INTRAVENOUS | Status: AC
  Filled 2015-10-10: qty 10

## 2015-10-10 NOTE — Progress Notes (Signed)
Subjective: Intubated and sedated on the ventilator   Antibiotics:  Anti-infectives    Start     Dose/Rate Route Frequency Ordered Stop   10/06/15 1200  ceFAZolin (ANCEF) IVPB 2g/100 mL premix  Status:  Discontinued    Comments:  Pharmacy may adjust   2 g 200 mL/hr over 30 Minutes Intravenous 3 times per day 10/06/15 1019 10/06/15 1019   10/06/15 1200  nafcillin 2 g in dextrose 5 % 100 mL IVPB     2 g 200 mL/hr over 30 Minutes Intravenous 6 times per day 10/06/15 1042     10/06/15 1100  nafcillin injection 2 g  Status:  Discontinued    Comments:  Pharmacy may adjust   2 g Intravenous Every 4 hours 10/06/15 1019 10/06/15 1042   10/05/15 1100  ceFAZolin (ANCEF) IVPB 2g/100 mL premix  Status:  Discontinued    Comments:  Pharmacy may adjust   2 g 200 mL/hr over 30 Minutes Intravenous Every 12 hours 10/05/15 0959 10/06/15 1019   10/05/15 0500  ceFEPIme (MAXIPIME) 2 g in dextrose 5 % 50 mL IVPB  Status:  Discontinued     2 g 100 mL/hr over 30 Minutes Intravenous Every 24 hours 10/04/15 1444 10/05/15 0959   10/04/15 1215  ampicillin (OMNIPEN) 2 g in sodium chloride 0.9 % 50 mL IVPB     2 g 150 mL/hr over 20 Minutes Intravenous NOW 10/04/15 1208 10/04/15 1305   10/04/15 0530  ceFEPIme (MAXIPIME) 2 g in dextrose 5 % 50 mL IVPB     2 g 100 mL/hr over 30 Minutes Intravenous  Once 10/04/15 0529 10/04/15 0612   10/04/15 0445  cefTRIAXone (ROCEPHIN) 2 g in dextrose 5 % 50 mL IVPB     2 g 100 mL/hr over 30 Minutes Intravenous  Once 10/04/15 0441 10/04/15 0529   10/04/15 0445  vancomycin (VANCOCIN) 2,000 mg in sodium chloride 0.9 % 500 mL IVPB     2,000 mg 250 mL/hr over 120 Minutes Intravenous  Once 10/04/15 0441 10/04/15 1234   10/04/15 0445  ampicillin (OMNIPEN) 2 g in sodium chloride 0.9 % 50 mL IVPB  Status:  Discontinued     2 g 150 mL/hr over 20 Minutes Intravenous  Once 10/04/15 0441 10/04/15 0532      Medications: Scheduled Meds: . sodium chloride   Intravenous Once   . sodium chloride   Intravenous Once  . sodium chloride   Intravenous Once  . antiseptic oral rinse  7 mL Mouth Rinse QID  . chlorhexidine gluconate (SAGE KIT)  15 mL Mouth Rinse BID  . clonazePAM  1 mg Per Tube TID  . feeding supplement (PRO-STAT SUGAR FREE 64)  30 mL Per Tube Daily  . furosemide  40 mg Intravenous Once  . nafcillin IV  2 g Intravenous 6 times per day  . pantoprazole sodium  40 mg Per Tube Q24H  . potassium chloride  10 mEq Intravenous Q1 Hr x 4  . QUEtiapine  50 mg Oral BID   Continuous Infusions: . sodium chloride Stopped (10/08/15 0000)  . dexmedetomidine Stopped (10/09/15 2156)  . feeding supplement (VITAL AF 1.2 CAL) 1,000 mL (10/10/15 0259)  . fentaNYL infusion INTRAVENOUS 400 mcg/hr (10/10/15 1530)  . midazolam (VERSED) infusion 10 mg/hr (10/10/15 1529)   PRN Meds:.acetaminophen (TYLENOL) oral liquid 160 mg/5 mL, docusate, fentaNYL (SUBLIMAZE) injection, [DISCONTINUED] ondansetron **OR** ondansetron (ZOFRAN) IV    Objective: Weight change: 5 lb 1.1 oz (2.3  kg)  Intake/Output Summary (Last 24 hours) at 10/10/15 1533 Last data filed at 10/10/15 1503  Gross per 24 hour  Intake 3208.18 ml  Output   1525 ml  Net 1683.18 ml   Blood pressure 115/67, pulse 87, temperature 101.2 F (38.4 C), temperature source Rectal, resp. rate 17, height 5' 8" (1.727 m), weight 164 lb 7.4 oz (74.6 kg), SpO2 100 %. Temp:  [98.9 F (37.2 C)-103.8 F (39.9 C)] 101.2 F (38.4 C) (04/03 1400) Pulse Rate:  [54-133] 87 (04/03 1515) Resp:  [12-43] 17 (04/03 1515) BP: (94-165)/(45-98) 115/67 mmHg (04/03 1515) SpO2:  [89 %-100 %] 100 % (04/03 1515) FiO2 (%):  [40 %] 40 % (04/03 1201) Weight:  [164 lb 7.4 oz (74.6 kg)] 164 lb 7.4 oz (74.6 kg) (04/03 0436)  Physical Exam: General: Intubated and sedated. HEENT: anicteric sclera, pupils reactive to light and accommodation, EOMI CVS regular rate, could not hear a murmur Chest:rhonchi Abdomen: soft nontender, nondistended,  normal bowel sounds, Extremities: no  clubbing or edema noted bilaterally Skin: Numerous injection sites throughout her body Neuro: nonfocal  CBC:  CBC Latest Ref Rng 10/10/2015 10/09/2015 10/08/2015  WBC 4.0 - 10.5 K/uL 10.7(H) 11.8(H) 9.6  Hemoglobin 12.0 - 15.0 g/dL 6.5(LL) 7.6(L) 7.8(L)  Hematocrit 36.0 - 46.0 % 20.6(L) 24.7(L) 25.4(L)  Platelets 150 - 400 K/uL 98(L) 98(L) 70(L)       BMET  Recent Labs  10/09/15 0450 10/10/15 0525 10/10/15 1406  NA 145 144  --   K 3.6 3.7 3.5  CL 113* 113*  --   CO2 24 25  --   GLUCOSE 108* 102*  --   BUN 24* 24*  --   CREATININE 0.59 0.58  --   CALCIUM 7.7* 7.6*  --      Liver Panel   Recent Labs  10/09/15 0450 10/10/15 0525  PROT 5.6* 5.1*  ALBUMIN 1.5* 1.3*  AST 22 27  ALT 17 17  ALKPHOS 79 65  BILITOT 0.8 0.8       Sedimentation Rate No results for input(s): ESRSEDRATE in the last 72 hours. C-Reactive Protein No results for input(s): CRP in the last 72 hours.  Micro Results: Recent Results (from the past 720 hour(s))  Culture, blood (single)     Status: None   Collection Time: 10/04/15  4:46 AM  Result Value Ref Range Status   Specimen Description BLOOD LEFT HIP  Final   Special Requests IN PEDIATRIC BOTTLE 5CC  Final   Culture  Setup Time   Final    GRAM POSITIVE COCCI IN CLUSTERS AEROBIC BOTTLE ONLY CRITICAL RESULT CALLED TO, READ BACK BY AND VERIFIED WITH: Oneta Rack LM [id number] 10/04/15 A BROWNING    Culture   Final    STAPHYLOCOCCUS AUREUS Performed at [facility]    Report Status 10/06/2015 FINAL  Final   Organism ID, Bacteria STAPHYLOCOCCUS AUREUS  Final      Susceptibility   Staphylococcus aureus - MIC*    CIPROFLOXACIN <=0.5 SENSITIVE Sensitive     ERYTHROMYCIN <=0.25 SENSITIVE Sensitive     GENTAMICIN <=0.5 SENSITIVE Sensitive     OXACILLIN <=0.25 SENSITIVE Sensitive     TETRACYCLINE <=1 SENSITIVE Sensitive     VANCOMYCIN 1 SENSITIVE Sensitive     TRIMETH/SULFA <=10 SENSITIVE Sensitive       CLINDAMYCIN <=0.25 SENSITIVE Sensitive     RIFAMPIN <=0.5 SENSITIVE Sensitive     Inducible Clindamycin NEGATIVE Sensitive     * STAPHYLOCOCCUS AUREUS  CSF  culture     Status: None   Collection Time: 10/04/15  6:00 AM  Result Value Ref Range Status   Specimen Description CSF  Final   Special Requests Normal  Final   Gram Stain   Final    CYTOSPIN WBC PRESENT,BOTH PMN AND MONONUCLEAR NO ORGANISMS SEEN Gram Stain Report Called to,Read Back By and Verified With: A. DENNIS RN AT 0700 ON 03.28.17 BY SHUEA    Culture   Final    NO GROWTH 3 DAYS Performed at [facility]    Report Status 10/07/2015 FINAL  Final  Anaerobic culture     Status: None   Collection Time: 10/04/15  6:00 AM  Result Value Ref Range Status   Specimen Description CSF  Final   Special Requests NONE  Final   Gram Stain   Final    WBC PRESENT,BOTH PMN AND MONONUCLEAR NO ORGANISMS SEEN CYTOSPIN SMEAR    Culture   Final    NO ANAEROBES ISOLATED Performed at [facility]    Report Status 10/09/2015 FINAL  Final  MRSA PCR Screening     Status: None   Collection Time: 10/04/15 10:52 PM  Result Value Ref Range Status   MRSA by PCR NEGATIVE NEGATIVE Final    Comment:        The GeneXpert MRSA Assay (FDA approved for NASAL specimens only), is one component of a comprehensive MRSA colonization surveillance program. It is not intended to diagnose MRSA infection nor to guide or monitor treatment for MRSA infections.   Culture, blood (routine x 2)     Status: None   Collection Time: 10/06/15  8:34 AM  Result Value Ref Range Status   Specimen Description BLOOD RIGHT ANTECUBITAL  Final   Special Requests IN PEDIATRIC BOTTLE 1CC  Final   Culture  Setup Time   Final    GRAM POSITIVE COCCI IN CLUSTERS AEROBIC BOTTLE ONLY CRITICAL RESULT CALLED TO, READ BACK BY AND VERIFIED WITH: S WHITE,RN _0  10/07/15 MKELLY    Culture   Final    STAPHYLOCOCCUS AUREUS SUSCEPTIBILITIES PERFORMED ON  PREVIOUS CULTURE WITHIN THE LAST 5 DAYS.    Report Status 10/08/2015 FINAL  Final  Culture, blood (routine x 2)     Status: None   Collection Time: 10/06/15  8:37 AM  Result Value Ref Range Status   Specimen Description BLOOD LEFT HAND  Final   Special Requests IN PEDIATRIC BOTTLE 2CC  Final   Culture  Setup Time   Final    GRAM POSITIVE COCCI IN CLUSTERS IN PEDIATRIC BOTTLE CRITICAL RESULT CALLED TO, READ BACK BY AND VERIFIED WITH: L WILSON,RN AT 3474 10/07/15 BY L BENFIELD    Culture   Final    STAPHYLOCOCCUS AUREUS SUSCEPTIBILITIES PERFORMED ON PREVIOUS CULTURE WITHIN THE LAST 5 DAYS.    Report Status 10/08/2015 FINAL  Final  Culture, respiratory (NON-Expectorated)     Status: None (Preliminary result)   Collection Time: 10/08/15  1:17 PM  Result Value Ref Range Status   Specimen Description TRACHEAL ASPIRATE  Final   Special Requests NONE  Final   Gram Stain   Final    ABUNDANT WBC PRESENT, PREDOMINANTLY PMN FEW SQUAMOUS EPITHELIAL CELLS PRESENT ABUNDANT YEAST FEW GRAM POSITIVE COCCI IN PAIRS IN CLUSTERS IN CHAINS THIS SPECIMEN IS ACCEPTABLE FOR SPUTUM CULTURE Performed at [facility]    Culture   Final    MODERATE YEAST Performed at [facility]    Report Status PENDING  Incomplete    Studies/Results: Dg Chest Port 1 View  10/10/2015  CLINICAL DATA:  Pneumonia. EXAM: PORTABLE CHEST 1 VIEW COMPARISON:  10/09/2015. FINDINGS: Endotracheal tube, NG tube, left IJ line stable position. Cardiomegaly. Worsening diffuse bilateral bilateral pulmonary alveolar infiltrates and pleural effusions. No pneumothorax. IMPRESSION: 1.  Lines and tubes in stable position. 2. Cardiomegaly with worsening bilateral pulmonary infiltrates and bilateral pleural effusions. Findings suggest congestive heart failure. Associated pneumonia cannot be excluded . Electronically Signed   By: Marcello Moores  Register   On: 10/10/2015 07:25   Dg Chest Port 1 View  10/09/2015  CLINICAL DATA:  Acute  onset of shortness of breath. Initial encounter. EXAM: PORTABLE CHEST 1 VIEW COMPARISON:  Chest radiograph performed 10/08/2015 FINDINGS: The patient's endotracheal tube is seen ending 4-5 cm above the carina. The patient's enteric tube is seen extending below the diaphragm. A left IJ line is noted ending at the right atrium. Patchy bilateral airspace opacities, with sparing of the lung apices, likely reflects multifocal pneumonia. Small bilateral pleural effusions are seen. No pneumothorax identified. The cardiomediastinal silhouette remains normal in size. No acute osseous abnormalities are identified. IMPRESSION: 1. Endotracheal tube seen ending 4-5 cm above the carina. 2. Left IJ line noted ending at the right atrium. 3. Patchy bilateral airspace opacities, with sparing of the lung apices, likely reflects multifocal pneumonia. 4. Small bilateral pleural effusions seen. Electronically Signed   By: Garald Balding M.D.   On: 10/09/2015 21:46      Assessment/Plan:  INTERVAL HISTORY:   10/10/15: patient with persistent fevers   Principal Problem:   Sepsis ([place]) secondary to endocarditis in an IV drug abuser Active Problems:   IV drug abuse   Acute renal failure (ARF) (HCC)   Thrombocytopenia (HCC)   Septic embolism (HCC)   Acute embolic stroke (HCC)   Prolonged Q-T interval on ECG   Multifocal Pneumonia, due to septic emboli   Anemia   Stroke (cerebrum) (HCC)   Pressure ulcer   Acute and subacute infective endocarditis in diseases classified elsewhere   Acute respiratory failure with hypoxia (HCC)   Altered mental status   Endotracheally intubated   AKI (acute kidney injury) ([place])    Cassandra Wall is a 35 y.o. 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 Therefore again need lines placed when she was bacteremic out. Could also be related to transfusion as well  #3 IVDU: enormous problem. Hopefully she survives to be able to then come to terms with this. She has very high mortality if she cannot completely stop this habit  #4 Chronic HCV without hepatic coma: Can try to cure her when she is outside the hospital. Medicaid would insist that she is drug free   LOS: 6 days   Alcide Evener 10/10/2015, 3:33 PM

## 2015-10-10 NOTE — Progress Notes (Signed)
Transfusion reaction suspected secondary to tremors, tachycardia (140's), increased resp rate of 40's, and temp 103 orally. [facility] and orders for tylenol and benadryl obtained. Blood bank notified and protocol followed. See safety zone. Unit director and charge nurse made aware.  Will monitor patient very closely.

## 2015-10-10 NOTE — Progress Notes (Signed)
eLink Physician-Brief Progress Note Patient Name: Cassandra ChouSamantha XXXSpradley DOB: 01-Feb-1981 MRN: [id number]   Date of Service  10/10/2015  HPI/Events of Note  prbc  eICU Interventions       Intervention Category Intermediate Interventions: Communication with other healthcare providers and/or family  Nelda BucksFEINSTEIN,Sarit Sparano J. 10/10/2015, 6:04 AM

## 2015-10-10 NOTE — Progress Notes (Signed)
PULMONARY / CRITICAL CARE MEDICINE   Name: Cassandra Wall MRN: [id number] DOB: [birth year]    ADMISSION DATE:  10/04/2015 CONSULTATION DATE:  10/06/15  REFERRING MD:  Patel,P  CHIEF COMPLAINT: Hypertensive urgency related to opioid withdrawl  HISTORY OF PRESENT ILLNESS:  Cassandra Wall is a 35 year old female with past medical history significant for polysubstance abuse(iv heroine and cocaine), tobacco abuse who presented to the ED with complaints of left sided weakness which was going on for two days prior to the admission.  CT of the brain was concerning for right sided infarct She was evaluated by neurology and they think it is septic emboli from IVDU. She has track marks all over her body. From her note it appears that her brother passed away with endocarditis due to IVDU. Rt side endocarditis. vent  SUBJECTIVE:  Intubated, 1 unit prbc on going Failed PST >> tachycardic, in resp distress On high dose versed and fentanyl  VITAL SIGNS: BP 133/68 mmHg  Pulse 103  Temp(Src) 100.3 F (37.9 C) (Oral)  Resp 12  Ht  (1.727 m)  Wt 164 lb 7.4 oz (74.6 kg)  BMI 25.01 kg/m2  SpO2 100%  HEMODYNAMICS:    VENTILATOR SETTINGS: Vent Mode:  [-] PRVC FiO2 (%):  [40 %] 40 % Set Rate:  [14 bmp] 14 bmp Vt Set:  [490 mL] 490 mL PEEP:  [5 cmH20] 5 cmH20 Pressure Support:  [5 cmH20] 5 cmH20 Plateau Pressure:  [14 cmH20-24 cmH20] 24 cmH20  INTAKE / OUTPUT: I/O last 3 completed shifts: In: 4676.5 [I.V.:2280.5; NG/GT:1496; IV Piggyback:900] Out: 2375 [Urine:2375]  PHYSICAL EXAMINATION: General:  Sedated on vent. No distress as sedated Neuro:  rass -3, left sided weakness, we are transitioning sedation   HEENT:  Atraumatic, normocephalic, no discharge Cardiovascular: regular. No MRG Lungs:  Ronchi, equal chest rise  Abdomen:  Tender on light palpation, no r/g, BS wnl Musculoskeletal:  No joint deformity Skin: track marks all over the body  *LABS:  BMET  Recent  Labs Lab 10/08/15 0559 10/09/15 0450 10/10/15 0525  NA 145 145 144  K 3.3* 3.6 3.7  CL 116* 113* 113*  CO2 21* 24 25  BUN 41* 24* 24*  CREATININE 0.91 0.59 0.58  GLUCOSE 100* 108* 102*    Electrolytes  Recent Labs Lab 10/08/15 0559 10/08/15 1153 10/08/15 2131 10/09/15 0450 10/09/15 0857 10/10/15 0525  CALCIUM 7.8*  --   --  7.7*  --  7.6*  MG  --  2.0 1.7  --  1.7  --   PHOS  --  4.4 4.6  --  4.1  --     CBC  Recent Labs Lab 10/08/15 0559 10/09/15 0450 10/10/15 0525  WBC 9.6 11.8* 10.7*  HGB 7.8* 7.6* 6.5*  HCT 25.4* 24.7* 20.6*  PLT 70* 98* 98*    Coag's  Recent Labs Lab 10/04/15 0445 10/04/15 0941 10/06/15 0832 10/07/15 0726  APTT 31 35  --   --   INR 1.31 1.39 1.29 1.45    Sepsis Markers  Recent Labs Lab 10/04/15 1232 10/06/15 1539 10/06/15 2248  LATICACIDVEN 0.9 2.6* 1.7  PROCALCITON 21.66  --   --     ABG  Recent Labs Lab 10/06/15 1255 10/06/15 2110 10/07/15 1217  PHART 7.416 7.316* 7.364  PCO2ART 17.9* 25.6* 29.3*  PO2ART 107* 129.0* 82.0    Liver Enzymes  Recent Labs Lab 10/08/15 0559 10/09/15 0450 10/10/15 0525  AST ALT 15 17 17  ALKPHOS 83 79 65  BILITOT 0.8 0.8 0.8  ALBUMIN 1.6* 1.5* 1.3*    Cardiac Enzymes No results for input(s): TROPONINI, PROBNP in the last 168 hours.  Glucose  Recent Labs Lab 10/09/15 1231 10/09/15 1629 10/09/15 1923 10/09/15 2342 10/10/15 0412 10/10/15 0824  GLUCAP 108* 114* 99 101* 89 82    Imaging Dg Chest Port 1 View  10/10/2015  CLINICAL DATA:  Pneumonia. EXAM: PORTABLE CHEST 1 VIEW COMPARISON:  10/09/2015. FINDINGS: Endotracheal tube, NG tube, left IJ line stable position. Cardiomegaly. Worsening diffuse bilateral bilateral pulmonary alveolar infiltrates and pleural effusions. No pneumothorax. IMPRESSION: 1.  Lines and tubes in stable position. 2. Cardiomegaly with worsening bilateral pulmonary infiltrates and bilateral pleural effusions. Findings suggest  congestive heart failure. Associated pneumonia cannot be excluded . Electronically Signed   By: Maisie Fushomas  Register   On: 10/10/2015 07:25   Dg Chest Port 1 View  10/09/2015  CLINICAL DATA:  Acute onset of shortness of breath. Initial encounter. EXAM: PORTABLE CHEST 1 VIEW COMPARISON:  Chest radiograph performed 10/08/2015 FINDINGS: The patient's endotracheal tube is seen ending 4-5 cm above the carina. The patient's enteric tube is seen extending below the diaphragm. A left IJ line is noted ending at the right atrium. Patchy bilateral airspace opacities, with sparing of the lung apices, likely reflects multifocal pneumonia. Small bilateral pleural effusions are seen. No pneumothorax identified. The cardiomediastinal silhouette remains normal in size. No acute osseous abnormalities are identified. IMPRESSION: 1. Endotracheal tube seen ending 4-5 cm above the carina. 2. Left IJ line noted ending at the right atrium. 3. Patchy bilateral airspace opacities, with sparing of the lung apices, likely reflects multifocal pneumonia. 4. Small bilateral pleural effusions seen. Electronically Signed   By: Roanna RaiderJeffery  Chang M.D.   On: 10/09/2015 21:46  remarkably worse on the left today   STUDIES:  3/293/30 US renal . No hydronephrosis.2. Increased parenchymal echogenicity bilaterally compatible with chronic medical renal disease. 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 No significant right-to-left shift or hemorrhagic transformation.BILATERAL occipital lobe hypodensities, subcentimeter size, could represent brain abscesses (versus acute subcortical infarcts). Consider continued surveillance with post infusion MRI. 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 EF of 50-55%  CULTURES:  3/28 BC > mssa 3/30>>>POS again  3/28 CSF-normal  ANTIBIOTICS: Naficillin 3/30>>>  SIGNIFICANT EVENTS: 3/28  admitted 3/30 intubated. PCCM consult  LINES/TUBES: 3/30 ETT>>> 3/30 line >>>out   DISCUSSION: 35 year old female with h/o polysubstance abuse now presents with right sided septic emboli ddue to IVDU. Major issues are debilitation s/p CVA, agitation, NAG metabolic acidosis.This has improved. We will cont supportive care. Transition to precedex. Hope we may be able to extubate her soon.   ASSESSMENT / PLAN:  PULMONARY A: Acute Hypoxemic respiratory failure Multifocal PNA due to septic emboli Worsening airspace disease on left  Pulm edema P:   Full vent support Not doing well on PST 2/2 mental status. Need to control withdrawal.  Needs diuresis  CARDIOVASCULAR A:  Endocarditis due to IVDU P:  Not a candidate for surgery, cvts following Cardiology following the patient Rpt blood culture Check QtC Needs K and Mg corrected. Needs lasix once elect OK  RENAL A:   AKI Hyperchloremia -->improved  Hypokalemia  NONAG acidosis from saline P:   On TF.   GASTROINTESTINAL A:   ?mesentric ischemia possibly due to IVDU Anemia R/O GI bleed.  P:   tolerating  feeds Protonix for GIP Check stool for blood  HEMATOLOGIC A:   Anemia Thrombocytopenia r/t sepsis   P:  CBC in am scds for DVT prophylaxis Heparin sq held Observe Hb and Hct.   INFECTIOUS A:   MSSA endocarditis with septic emboli Suspected cellulitis involving the drug injection site Sputum G(+) cip P:   Continue naficillin as per ID, and assess if can clear this Repeat BC  Repeat respiratory culture w/ worsening airspace disease.   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  Encephalopathy  Heroine withdrawal  P:   RASS goal: -1 - 0 Cont fentanyl and versed drip. Appreciate Pharmacy help with AD/anxiolytics. Try seroquel today on top of clonipin   FAMILY  - Updates: no family present at bedside   - Inter-disciplinary  family meet or Palliative Care meeting due by: 10/13/15   Critical care time spent on this pt : 35 minutes.   Pollie Meyer, MD 10/10/2015, 11:04 AM [facility] Pager [phone] After 3 pm or if no answer, call [phone]

## 2015-10-10 NOTE — Progress Notes (Signed)
Dr Christene Slatese Dios notified of positive stool for blood. No new orders at this time.

## 2015-10-10 NOTE — Progress Notes (Signed)
CRITICAL VALUE ALERT  Critical value received:  Hgb 6.5  Date of notification:  10/10/15  Time of notification:  0600  Critical value read back: Yes  Nurse who received alert: Eddie NorthJennifer Nirav Sweda, RN  MD notified (1st page):  ELINK  Time of first page:  0601  MD notified (2nd page):  Time of second page:  Responding MD:  Pola CornELINK  Time MD responded:  [phone]

## 2015-10-11 ENCOUNTER — Inpatient Hospital Stay (HOSPITAL_COMMUNITY): Payer: Medicaid Other

## 2015-10-11 DIAGNOSIS — Z4659 Encounter for fitting and adjustment of other gastrointestinal appliance and device: Secondary | ICD-10-CM | POA: Insufficient documentation

## 2015-10-11 DIAGNOSIS — I668 Occlusion and stenosis of other cerebral arteries: Secondary | ICD-10-CM

## 2015-10-11 DIAGNOSIS — B370 Candidal stomatitis: Secondary | ICD-10-CM

## 2015-10-11 DIAGNOSIS — J9600 Acute respiratory failure, unspecified whether with hypoxia or hypercapnia: Secondary | ICD-10-CM | POA: Insufficient documentation

## 2015-10-11 DIAGNOSIS — Z452 Encounter for adjustment and management of vascular access device: Secondary | ICD-10-CM | POA: Insufficient documentation

## 2015-10-11 LAB — CULTURE, RESPIRATORY W GRAM STAIN

## 2015-10-11 LAB — BASIC METABOLIC PANEL
ANION GAP: 6 (ref 5–15)
Anion gap: 8 (ref 5–15)
BUN: 17 mg/dL (ref 6–20)
BUN: 14 mg/dL (ref 6–20)
CALCIUM: 8 mg/dL — AB (ref 8.9–10.3)
CALCIUM: 7.5 mg/dL — AB (ref 8.9–10.3)
CHLORIDE: 114 mmol/L — AB (ref 101–111)
CO2: 25 mmol/L (ref 22–32)
CO2: 23 mmol/L (ref 22–32)
CREATININE: 0.47 mg/dL (ref 0.44–1.00)
Chloride: 115 mmol/L — ABNORMAL HIGH (ref 101–111)
Creatinine, Ser: 0.4 mg/dL — ABNORMAL LOW (ref 0.44–1.00)
GFR calc Af Amer: >60 mL/min (ref >60)
GFR calc non Af Amer: >60 mL/min (ref >60)
GLUCOSE: 112 mg/dL — AB (ref 65–99)
Glucose, Bld: 112 mg/dL — ABNORMAL HIGH (ref 65–99)
Potassium: 3.6 mmol/L (ref 3.5–5.1)
Potassium: 4.1 mmol/L (ref 3.5–5.1)
SODIUM: 144 mmol/L (ref 135–145)
Sodium: 147 mmol/L — ABNORMAL HIGH (ref 135–145)

## 2015-10-11 LAB — MAGNESIUM
MAGNESIUM: 1.9 mg/dL (ref 1.7–2.4)
MAGNESIUM: 2.2 mg/dL (ref 1.7–2.4)
Magnesium: 1.6 mg/dL — ABNORMAL LOW (ref 1.7–2.4)

## 2015-10-11 LAB — GLUCOSE, CAPILLARY
GLUCOSE-CAPILLARY: 79 mg/dL (ref 65–99)
GLUCOSE-CAPILLARY: 81 mg/dL (ref 65–99)
GLUCOSE-CAPILLARY: 83 mg/dL (ref 65–99)
GLUCOSE-CAPILLARY: 88 mg/dL (ref 65–99)
Glucose-Capillary: 93 mg/dL (ref 65–99)

## 2015-10-11 LAB — PROTIME-INR
INR: 1.35 (ref 0.00–1.49)
Prothrombin Time: 16.8 seconds — ABNORMAL HIGH (ref 11.6–15.2)

## 2015-10-11 LAB — CULTURE, RESPIRATORY

## 2015-10-11 LAB — APTT: aPTT: 31 seconds (ref 24–37)

## 2015-10-11 MED ORDER — MAGNESIUM SULFATE 2 GM/50ML IV SOLN
2.0000 g | Freq: Once | INTRAVENOUS | Status: AC
  Administered 2015-10-11: 2 g via INTRAVENOUS
  Filled 2015-10-11: qty 50

## 2015-10-11 MED ORDER — POTASSIUM CHLORIDE 20 MEQ/15ML (10%) PO SOLN
40.0000 meq | Freq: Once | ORAL | Status: AC
  Administered 2015-10-11: 40 meq via ORAL
  Filled 2015-10-11: qty 30

## 2015-10-11 MED ORDER — SODIUM CHLORIDE 0.9% FLUSH
10.0000 mL | Freq: Two times a day (BID) | INTRAVENOUS | Status: DC
  Administered 2015-10-11 – 2015-10-14 (×6): 10 mL
  Administered 2015-10-15 – 2015-10-16 (×2): 20 mL
  Administered 2015-10-17: 30 mL
  Administered 2015-10-17 – 2015-10-20 (×7): 10 mL
  Administered 2015-10-21 – 2015-10-22 (×2): 20 mL
  Administered 2015-10-22 – 2015-10-26 (×7): 10 mL

## 2015-10-11 MED ORDER — FUROSEMIDE 10 MG/ML IJ SOLN
40.0000 mg | Freq: Once | INTRAMUSCULAR | Status: AC
Start: 2015-10-11 — End: 2015-10-11
  Administered 2015-10-11: 40 mg via INTRAVENOUS
  Filled 2015-10-11: qty 4

## 2015-10-11 MED ORDER — SODIUM CHLORIDE 0.9% FLUSH
10.0000 mL | INTRAVENOUS | Status: DC | PRN
  Administered 2015-10-21: 10 mL
  Filled 2015-10-11: qty 40

## 2015-10-11 NOTE — Progress Notes (Signed)
CSW consult acknowledged. IF Patient's husband remains unreachable, Patient's mother becomes next of kin/decision maker if Patient deemed incompetent to make decisions. CSW will continue to follow for substance abuse concerns and SNF placement.  Noe GensAshley Gardner, MSW, LCSW [facility]/ 76M Clinical Social Worker [phone]

## 2015-10-11 NOTE — Progress Notes (Signed)
Peripherally Inserted Central Catheter/Midline Placement  The IV Nurse has discussed with the patient and/or persons authorized to consent for the patient, the purpose of this procedure and the potential benefits and risks involved with this procedure.  The benefits include less needle sticks, lab draws from the catheter and patient may be discharged home with the catheter.  Risks include, but not limited to, infection, bleeding, blood clot (thrombus formation), and puncture of an artery; nerve damage and irregular heat beat.  Alternatives to this procedure were also discussed.  Consent obtained from mother due to altered mental state.  PICC/Midline Placement Documentation  PICC Triple Lumen 10/11/15 PICC Right Brachial 36 cm 0 cm (Active)  Indication for Insertion or Continuance of Line Chronic illness with exacerbations (CF, Sickle Cell, etc.);Prolonged intravenous therapies 10/11/2015  1:01 PM  Exposed Catheter (cm) 0 cm 10/11/2015  1:01 PM  Dressing Change Due 10/18/15 10/11/2015  1:01 PM       Herta Hink, Lajean ManesKerry Loraine 10/11/2015, 1:01 PM

## 2015-10-11 NOTE — Progress Notes (Signed)
Subjective: Intubated and sedated on the ventilator   Antibiotics:  Anti-infectives    Start     Dose/Rate Route Frequency Ordered Stop   10/06/15 1200  ceFAZolin (ANCEF) IVPB 2g/100 mL premix  Status:  Discontinued    Comments:  Pharmacy may adjust   2 g 200 mL/hr over 30 Minutes Intravenous 3 times per day 10/06/15 1019 10/06/15 1019   10/06/15 1200  nafcillin 2 g in dextrose 5 % 100 mL IVPB     2 g 200 mL/hr over 30 Minutes Intravenous 6 times per day 10/06/15 1042     10/06/15 1100  nafcillin injection 2 g  Status:  Discontinued    Comments:  Pharmacy may adjust   2 g Intravenous Every 4 hours 10/06/15 1019 10/06/15 1042   10/05/15 1100  ceFAZolin (ANCEF) IVPB 2g/100 mL premix  Status:  Discontinued    Comments:  Pharmacy may adjust   2 g 200 mL/hr over 30 Minutes Intravenous Every 12 hours 10/05/15 0959 10/06/15 1019   10/05/15 0500  ceFEPIme (MAXIPIME) 2 g in dextrose 5 % 50 mL IVPB  Status:  Discontinued     2 g 100 mL/hr over 30 Minutes Intravenous Every 24 hours 10/04/15 1444 10/05/15 0959   10/04/15 1215  ampicillin (OMNIPEN) 2 g in sodium chloride 0.9 % 50 mL IVPB     2 g 150 mL/hr over 20 Minutes Intravenous NOW 10/04/15 1208 10/04/15 1305   10/04/15 0530  ceFEPIme (MAXIPIME) 2 g in dextrose 5 % 50 mL IVPB     2 g 100 mL/hr over 30 Minutes Intravenous  Once 10/04/15 0529 10/04/15 0612   10/04/15 0445  cefTRIAXone (ROCEPHIN) 2 g in dextrose 5 % 50 mL IVPB     2 g 100 mL/hr over 30 Minutes Intravenous  Once 10/04/15 0441 10/04/15 0529   10/04/15 0445  vancomycin (VANCOCIN) 2,000 mg in sodium chloride 0.9 % 500 mL IVPB     2,000 mg 250 mL/hr over 120 Minutes Intravenous  Once 10/04/15 0441 10/04/15 1234   10/04/15 0445  ampicillin (OMNIPEN) 2 g in sodium chloride 0.9 % 50 mL IVPB  Status:  Discontinued     2 g 150 mL/hr over 20 Minutes Intravenous  Once 10/04/15 0441 10/04/15 0532      Medications: Scheduled Meds: . sodium chloride   Intravenous Once   . sodium chloride   Intravenous Once  . sodium chloride   Intravenous Once  . antiseptic oral rinse  7 mL Mouth Rinse QID  . chlorhexidine gluconate (SAGE KIT)  15 mL Mouth Rinse BID  . clonazePAM  1 mg Per Tube TID  . feeding supplement (PRO-STAT SUGAR FREE 64)  30 mL Per Tube Daily  . magnesium sulfate 1 - 4 g bolus IVPB  2 g Intravenous Once  . nafcillin IV  2 g Intravenous 6 times per day  . pantoprazole sodium  40 mg Per Tube Q24H  . QUEtiapine  50 mg Oral BID  . sodium chloride flush  10-40 mL Intracatheter Q12H   Continuous Infusions: . sodium chloride Stopped (10/08/15 0000)  . dexmedetomidine Stopped (10/09/15 2156)  . feeding supplement (VITAL AF 1.2 CAL) 1,000 mL (10/11/15 0539)  . fentaNYL infusion INTRAVENOUS 400 mcg/hr (10/11/15 1214)  . midazolam (VERSED) infusion 10 mg/hr (10/11/15 1215)   PRN Meds:.acetaminophen (TYLENOL) oral liquid 160 mg/5 mL, docusate, fentaNYL (SUBLIMAZE) injection, [DISCONTINUED] ondansetron **OR** ondansetron (ZOFRAN) IV, sodium chloride flush  Objective: Weight change: 6 lb 6.3 oz (2.9 kg)  Intake/Output Summary (Last 24 hours) at 10/11/15 1658 Last data filed at 10/11/15 1621  Gross per 24 hour  Intake 3094.27 ml  Output   2625 ml  Net 469.27 ml   Blood pressure 128/81, pulse 127, temperature 100 F (37.8 C), temperature source Rectal, resp. rate 31, height 5' 8"  (1.727 m), weight 170 lb 13.7 oz (77.5 kg), SpO2 100 %. Temp:  [98.6 F (37 C)-102.8 F (39.3 C)] 100 F (37.8 C) (04/04 1601) Pulse Rate:  [81-129] 127 (04/04 1601) Resp:  [14-35] 31 (04/04 1601) BP: (102-178)/(28-141) 128/81 mmHg (04/04 1601) SpO2:  [92 %-100 %] 100 % (04/04 1601) FiO2 (%):  [40 %] 40 % (04/04 1601) Weight:  [170 lb 13.7 oz (77.5 kg)] 170 lb 13.7 oz (77.5 kg) (04/04 0348)  Physical Exam: General: Intubated and sedated. HEENT: anicteric sclera, pupils reactive to light and accommodation, EOMI CVS regular rate, could not hear a  murmur Chest:rhonchi throughout Abdomen: soft nontender, nondistended, normal bowel sounds, Extremities: no  clubbing or edema noted bilaterally Skin: Numerous injection sites throughout her body, PICC line in place IJ in place Neuro: nonfocal  CBC:  CBC Latest Ref Rng 10/10/2015 10/10/2015 10/09/2015  WBC 4.0 - 10.5 K/uL 12.8(H) 10.7(H) 11.8(H)  Hemoglobin 12.0 - 15.0 g/dL 7.6(L) 6.5(LL) 7.6(L)  Hematocrit 36.0 - 46.0 % 23.7(L) 20.6(L) 24.7(L)  Platelets 150 - 400 K/uL 114(L) 98(L) 98(L)       BMET  Recent Labs  10/10/15 0525  10/10/15 2146 10/11/15 1138  NA 144  --   --  147*  K 3.7  < > 4.6 3.6  CL 113*  --   --  114*  CO2 25  --   --  25  GLUCOSE 102*  --   --  112*  BUN 24*  --   --  17  CREATININE 0.58  --   --  0.47  CALCIUM 7.6*  --   --  8.0*  < > = values in this interval not displayed.   Liver Panel   Recent Labs  10/09/15 0450 10/10/15 0525  PROT 5.6* 5.1*  ALBUMIN 1.5* 1.3*  AST 22 27  ALT 17 17  ALKPHOS 79 65  BILITOT 0.8 0.8       Sedimentation Rate No results for input(s): ESRSEDRATE in the last 72 hours. C-Reactive Protein No results for input(s): CRP in the last 72 hours.  Micro Results: Recent Results (from the past 720 hour(s))  Culture, blood (single)     Status: None   Collection Time: 10/04/15  4:46 AM  Result Value Ref Range Status   Specimen Description BLOOD LEFT HIP  Final   Special Requests IN PEDIATRIC BOTTLE 5CC  Final   Culture  Setup Time   Final    GRAM POSITIVE COCCI IN CLUSTERS AEROBIC BOTTLE ONLY CRITICAL RESULT CALLED TO, READ BACK BY AND VERIFIED WITHOneta Rack BT [id number] 10/04/15 A BROWNING    Culture   Final    STAPHYLOCOCCUS AUREUS Performed at [facility]    Report Status 10/06/2015 FINAL  Final   Organism ID, Bacteria STAPHYLOCOCCUS AUREUS  Final      Susceptibility   Staphylococcus aureus - MIC*    CIPROFLOXACIN <=0.5 SENSITIVE Sensitive     ERYTHROMYCIN <=0.25 SENSITIVE Sensitive      GENTAMICIN <=0.5 SENSITIVE Sensitive     OXACILLIN <=0.25 SENSITIVE Sensitive     TETRACYCLINE <=1 SENSITIVE Sensitive  VANCOMYCIN 1 SENSITIVE Sensitive     TRIMETH/SULFA <=10 SENSITIVE Sensitive     CLINDAMYCIN <=0.25 SENSITIVE Sensitive     RIFAMPIN <=0.5 SENSITIVE Sensitive     Inducible Clindamycin NEGATIVE Sensitive     * STAPHYLOCOCCUS AUREUS  CSF culture     Status: None   Collection Time: 10/04/15  6:00 AM  Result Value Ref Range Status   Specimen Description CSF  Final   Special Requests Normal  Final   Gram Stain   Final    CYTOSPIN WBC PRESENT,BOTH PMN AND MONONUCLEAR NO ORGANISMS SEEN Gram Stain Report Called to,Read Back By and Verified With: A. DENNIS RN AT 0700 ON 03.28.17 BY SHUEA    Culture   Final    NO GROWTH 3 DAYS Performed at [facility]    Report Status 10/07/2015 FINAL  Final  Anaerobic culture     Status: None   Collection Time: 10/04/15  6:00 AM  Result Value Ref Range Status   Specimen Description CSF  Final   Special Requests NONE  Final   Gram Stain   Final    WBC PRESENT,BOTH PMN AND MONONUCLEAR NO ORGANISMS SEEN CYTOSPIN SMEAR    Culture   Final    NO ANAEROBES ISOLATED Performed at [facility]    Report Status 10/09/2015 FINAL  Final  MRSA PCR Screening     Status: None   Collection Time: 10/04/15 10:52 PM  Result Value Ref Range Status   MRSA by PCR NEGATIVE NEGATIVE Final    Comment:        The GeneXpert MRSA Assay (FDA approved for NASAL specimens only), is one component of a comprehensive MRSA colonization surveillance program. It is not intended to diagnose MRSA infection nor to guide or monitor treatment for MRSA infections.   Culture, blood (routine x 2)     Status: None   Collection Time: 10/06/15  8:34 AM  Result Value Ref Range Status   Specimen Description BLOOD RIGHT ANTECUBITAL  Final   Special Requests IN PEDIATRIC BOTTLE 1CC  Final   Culture  Setup Time   Final    GRAM POSITIVE COCCI IN  CLUSTERS AEROBIC BOTTLE ONLY CRITICAL RESULT CALLED TO, READ BACK BY AND VERIFIED WITH: S WHITE,RN @0247  10/07/15 MKELLY    Culture   Final    STAPHYLOCOCCUS AUREUS SUSCEPTIBILITIES PERFORMED ON PREVIOUS CULTURE WITHIN THE LAST 5 DAYS.    Report Status 10/08/2015 FINAL  Final  Culture, blood (routine x 2)     Status: None   Collection Time: 10/06/15  8:37 AM  Result Value Ref Range Status   Specimen Description BLOOD LEFT HAND  Final   Special Requests IN PEDIATRIC BOTTLE 2CC  Final   Culture  Setup Time   Final    GRAM POSITIVE COCCI IN CLUSTERS IN PEDIATRIC BOTTLE CRITICAL RESULT CALLED TO, READ BACK BY AND VERIFIED WITH: L WILSON,RN AT 5027 10/07/15 BY L BENFIELD    Culture   Final    STAPHYLOCOCCUS AUREUS SUSCEPTIBILITIES PERFORMED ON PREVIOUS CULTURE WITHIN THE LAST 5 DAYS.    Report Status 10/08/2015 FINAL  Final  Culture, respiratory (NON-Expectorated)     Status: None   Collection Time: 10/08/15  1:17 PM  Result Value Ref Range Status   Specimen Description TRACHEAL ASPIRATE  Final   Special Requests NONE  Final   Gram Stain   Final    ABUNDANT WBC PRESENT, PREDOMINANTLY PMN FEW SQUAMOUS EPITHELIAL CELLS PRESENT ABUNDANT YEAST FEW GRAM POSITIVE  COCCI IN PAIRS IN CLUSTERS IN CHAINS THIS SPECIMEN IS ACCEPTABLE FOR SPUTUM CULTURE Performed at [facility]    Culture   Final    MODERATE CANDIDA ALBICANS Performed at [facility]    Report Status 10/11/2015 FINAL  Final  Culture, blood (Routine X 2) w Reflex to ID Panel     Status: None (Preliminary result)   Collection Time: 10/10/15  4:04 PM  Result Value Ref Range Status   Specimen Description BLOOD RIGHT FOOT  Final   Special Requests IN PEDIATRIC BOTTLE 0.75CC  Final   Culture NO GROWTH < 24 HOURS  Final   Report Status PENDING  Incomplete  Culture, blood (Routine X 2) w Reflex to ID Panel     Status: None (Preliminary result)   Collection Time: 10/10/15  4:10 PM  Result Value Ref Range Status    Specimen Description BLOOD LEFT FOOT  Final   Special Requests IN PEDIATRIC BOTTLE 0.75CC  Final   Culture NO GROWTH < 24 HOURS  Final   Report Status PENDING  Incomplete    Studies/Results: Dg Chest Port 1 View  10/11/2015  CLINICAL DATA:  PICC in place EXAM: PORTABLE CHEST 1 VIEW COMPARISON:  10/10/2015 FINDINGS: Right upper extremity PICC with tip at the SVC. No displacement of left IJ central line tip at the upper right atrium. Endotracheal tube tip is just above the clavicular heads. An orogastric tube reaches stomach at least. Bilateral airspace disease suggesting pneumonia. There is dense retrocardiac opacification which may reflect layering pleural effusion. No pneumothorax. Normal heart size. IMPRESSION: 1. New right upper extremity PICC with tip in good position. 2. Bilateral pneumonia and pleural fluid. Improved basilar aeration since yesterday. Electronically Signed   By: Monte Fantasia M.D.   On: 10/11/2015 13:48   Dg Chest Port 1 View  10/10/2015  CLINICAL DATA:  Pneumonia. EXAM: PORTABLE CHEST 1 VIEW COMPARISON:  10/09/2015. FINDINGS: Endotracheal tube, NG tube, left IJ line stable position. Cardiomegaly. Worsening diffuse bilateral bilateral pulmonary alveolar infiltrates and pleural effusions. No pneumothorax. IMPRESSION: 1.  Lines and tubes in stable position. 2. Cardiomegaly with worsening bilateral pulmonary infiltrates and bilateral pleural effusions. Findings suggest congestive heart failure. Associated pneumonia cannot be excluded . Electronically Signed   By: Marcello Moores  Register   On: 10/10/2015 07:25   Dg Chest Port 1 View  10/09/2015  CLINICAL DATA:  Acute onset of shortness of breath. Initial encounter. EXAM: PORTABLE CHEST 1 VIEW COMPARISON:  Chest radiograph performed 10/08/2015 FINDINGS: The patient's endotracheal tube is seen ending 4-5 cm above the carina. The patient's enteric tube is seen extending below the diaphragm. A left IJ line is noted ending at the right atrium.  Patchy bilateral airspace opacities, with sparing of the lung apices, likely reflects multifocal pneumonia. Small bilateral pleural effusions are seen. No pneumothorax identified. The cardiomediastinal silhouette remains normal in size. No acute osseous abnormalities are identified. IMPRESSION: 1. Endotracheal tube seen ending 4-5 cm above the carina. 2. Left IJ line noted ending at the right atrium. 3. Patchy bilateral airspace opacities, with sparing of the lung apices, likely reflects multifocal pneumonia. 4. Small bilateral pleural effusions seen. Electronically Signed   By: Garald Balding M.D.   On: 10/09/2015 21:46      Assessment/Plan:  INTERVAL HISTORY:   10/10/15: patient with persistent fevers 10/11/15: PICC placed and now IJ removed  Principal Problem:   Sepsis ([place]) secondary to endocarditis in an IV drug abuser Active Problems:   IV drug  abuse   Acute renal failure (ARF) (HCC)   Thrombocytopenia (HCC)   Septic embolism (HCC)   Acute embolic stroke (HCC)   Prolonged Q-T interval on ECG   Multifocal Pneumonia, due to septic emboli   Anemia   Stroke (cerebrum) (HCC)   Pressure ulcer   Acute and subacute infective endocarditis in diseases classified elsewhere   Acute respiratory failure with hypoxia (HCC)   Altered mental status   Endotracheally intubated   AKI (acute kidney injury) ([place])   Brain abscess   PNA (pneumonia)   Septic shock (HCC)   Staphylococcus aureus bacteremia with sepsis ([place])    Azaela Caracci is a 35 y.o. 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 Again the concern remains for lines that a been seeded that were placed in the setting of bacteremia now her  central line has been discontinued. There is risk that the PICC line also may calm infected but hopefully that is not the case and certainly we hope that down the road if she survives this acute critical illness she can have a central line holiday eventually   #3 IVDU: enormous problem. Hopefully she survives to be able to then come to terms with this. She has very high mortality if she cannot completely stop this habit  #4 Chronic HCV without hepatic coma: Can try to cure her when she is outside the hospital. Medicaid would insist that she is drug free  #5 Candida in sputum: of no consequence unless as maker of esophagitis   LOS: 7 days   Alcide Evener 10/11/2015, 4:58 PM

## 2015-10-11 NOTE — Progress Notes (Signed)
ET tube holder changed.

## 2015-10-11 NOTE — Progress Notes (Signed)
PULMONARY / CRITICAL CARE MEDICINE   Name: Cassandra Wall MRN: [id number] DOB: [birth year]    ADMISSION DATE:  10/04/2015 CONSULTATION DATE:  10/06/15  REFERRING MD:  Patel,P  CHIEF COMPLAINT: Hypertensive urgency related to opioid withdrawl  HISTORY OF PRESENT ILLNESS:  Cassandra Wall is a 35 year old female with past medical history significant for polysubstance abuse(iv heroine and cocaine), tobacco abuse who presented to the ED with complaints of left sided weakness which was going on for two days prior to the admission.  CT of the brain was concerning for right sided infarct She was evaluated by neurology and they think it is septic emboli from IVDU. She has track marks all over her body. From her note it appears that her brother passed away with endocarditis due to IVDU. Rt side endocarditis. vent  SUBJECTIVE:  Intubated, agitated.  Still febrile   VITAL SIGNS: BP 139/70 mmHg  Pulse 117  Temp(Src) 102.8 F (39.3 C) (Rectal)  Resp 26  Ht  (1.727 m)  Wt 170 lb 13.7 oz (77.5 kg)  BMI 25.98 kg/m2  SpO2 94%  HEMODYNAMICS:    VENTILATOR SETTINGS: Vent Mode:  [-] PRVC FiO2 (%):  [40 %] 40 % Set Rate:  [14 bmp] 14 bmp Vt Set:  [490 mL] 490 mL PEEP:  [5 cmH20] 5 cmH20 Plateau Pressure:  [18 cmH20-26 cmH20] 26 cmH20  INTAKE / OUTPUT: I/O last 3 completed shifts: In: 5187.2 [I.V.:2237.2; Blood:290; NG/GT:1560; IV Piggyback:1100] Out: 3000 [Urine:3000]  PHYSICAL EXAMINATION: General:  Sedated on vent. Mild  distress as sedated Neuro:  rass -3, left sided weakness, we are transitioning sedation   HEENT:  Atraumatic, normocephalic, no discharge Cardiovascular: regular. No MRG Lungs:  Ronchi, equal chest rise . More rhonchi Abdomen:  Tender on light palpation, no r/g, BS wnl Musculoskeletal:  No joint deformity Skin: track marks all over the body  *LABS:  BMET  Recent Labs Lab 10/08/15 0559 10/09/15 0450 10/10/15 0525 10/10/15 1406 10/10/15 2146   NA 145 145 144  --   --   K 3.3* 3.6 3.7 3.5 4.6  CL 116* 113* 113*  --   --   CO2 21* 24 25  --   --   BUN 41* 24* 24*  --   --   CREATININE 0.91 0.59 0.58  --   --   GLUCOSE 100* 108* 102*  --   --     Electrolytes  Recent Labs Lab 10/08/15 0559 10/08/15 1153 10/08/15 2131 10/09/15 0450 10/09/15 0857 10/10/15 0525 10/11/15 0347  CALCIUM 7.8*  --   --  7.7*  --  7.6*  --   MG  --  2.0 1.7  --  1.7  --  1.9  PHOS  --  4.4 4.6  --  4.1  --   --     CBC  Recent Labs Lab 10/09/15 0450 10/10/15 0525 10/10/15 1542  WBC 11.8* 10.7* 12.8*  HGB 7.6* 6.5* 7.6*  HCT 24.7* 20.6* 23.7*  PLT 98* 98* 114*    Coag's  Recent Labs Lab 10/06/15 0832 10/07/15 0726  INR 1.29 1.45    Sepsis Markers  Recent Labs Lab 10/04/15 1232 10/06/15 1539 10/06/15 2248  LATICACIDVEN 0.9 2.6* 1.7  PROCALCITON 21.66  --   --     ABG  Recent Labs Lab 10/06/15 1255 10/06/15 2110 10/07/15 1217  PHART 7.416 7.316* 7.364  PCO2ART 17.9* 25.6* 29.3*  PO2ART 107* 129.0* 82.0    Liver Enzymes  Recent Labs Lab 10/08/15 0559 10/09/15 0450 10/10/15 0525  AST 25 22 27   ALT 15 17 17   ALKPHOS 83 79 65  BILITOT 0.8 0.8 0.8  ALBUMIN 1.6* 1.5* 1.3*    Cardiac Enzymes No results for input(s): TROPONINI, PROBNP in the last 168 hours.  Glucose  Recent Labs Lab 10/10/15 1228 10/10/15 1545 10/10/15 2025 10/10/15 2339 10/11/15 0331 10/11/15 0833  GLUCAP 99 102* 89 81 79 93    Imaging No results found.remarkably worse on the left today   STUDIES:  3/293/30 US renal . No hydronephrosis.2. Increased parenchymal echogenicity bilaterally compatible with chronic medical renal disease. 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 No significant right-to-left shift or hemorrhagic transformation.BILATERAL occipital lobe hypodensities, subcentimeter size, could represent brain abscesses (versus acute subcortical  infarcts). Consider continued surveillance with post infusion MRI. 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 EF of 50-55%  CULTURES:  3/28 BC > mssa 3/30>>>POS again  3/28 CSF-normal  ANTIBIOTICS: Naficillin 3/30>>>  SIGNIFICANT EVENTS: 3/28 admitted 3/30 intubated. PCCM consult  LINES/TUBES: 3/30 ETT>>> 3/30 line >>>out   DISCUSSION: 35 year old female with h/o polysubstance abuse now presents with right sided septic emboli ddue to IVDU. Major issues are debilitation s/p CVA, agitation, NAG metabolic acidosis.This has improved. We will cont supportive care. Transition to precedex. Hope we may be able to extubate her soon.   ASSESSMENT / PLAN:  PULMONARY A: Acute Hypoxemic respiratory failure Multifocal PNA due to septic emboli Worsening airspace disease on left  Pulm edema P:   Full vent support Not doing well on PST 2/2 mental status. Need to control withdrawal.  Needs diuresis  CARDIOVASCULAR A:  Endocarditis due to IVDU P:  Not a candidate for surgery, cvts following Cardiology following the patient Rpt blood culture >  Check QtC Needs K and Mg corrected. Needs lasix once elect OK  RENAL A:   AKI Hyperchloremia -->improved  Hypokalemia  NONAG acidosis from saline P:   On TF.  Needs diuresis  GASTROINTESTINAL A:   ?mesentric ischemia possibly due to IVDU Anemia R/O GI bleed.    P:   tolerating feeds Protonix for GIP Check stool for blood > blood in stool  HEMATOLOGIC A:   Anemia Thrombocytopenia r/t sepsis   P:  CBC in am scds for DVT prophylaxis Heparin sq held Observe Hb and Hct.   INFECTIOUS A:   MSSA endocarditis with septic emboli Suspected cellulitis involving the drug injection site Sputum G(+) cip P:   Continue naficillin as per ID, and assess if can clear this Repeat BC  Repeat respiratory culture today >> more secretions Will need PICC then D/c TLC.  Consider PSA  coverage depending on gram stain   ENDOCRINE A:   No acute issues P:   BG intermitently  NEUROLOGIC A:   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  Encephalopathy  Heroine withdrawal  P:   RASS goal: -1 - 0 Cont fentanyl and versed drip. Appreciate Pharmacy help with AD/anxiolytics. Cont seroquel today on top of clonipin   FAMILY  - Updates: mother and father updated. Potential trache.   - Inter-disciplinary family meet or Palliative Care meeting due by: 10/13/15   Critical care time spent on this pt : 35 minutes.   Pollie MeyerJ. Angelo A de Dios, MD 10/11/2015, 10:57 AM [facility] Pager [phone] After 3 pm or if no  answer, call [phone]

## 2015-10-12 ENCOUNTER — Inpatient Hospital Stay (HOSPITAL_COMMUNITY): Payer: Medicaid Other

## 2015-10-12 ENCOUNTER — Encounter (HOSPITAL_COMMUNITY): Payer: Self-pay | Admitting: *Deleted

## 2015-10-12 DIAGNOSIS — J189 Pneumonia, unspecified organism: Secondary | ICD-10-CM

## 2015-10-12 LAB — BASIC METABOLIC PANEL
Anion gap: 9 (ref 5–15)
Anion gap: 9 (ref 5–15)
BUN: 15 mg/dL (ref 6–20)
BUN: 13 mg/dL (ref 6–20)
CALCIUM: 8.1 mg/dL — AB (ref 8.9–10.3)
CALCIUM: 8.2 mg/dL — AB (ref 8.9–10.3)
CHLORIDE: 109 mmol/L (ref 101–111)
CO2: 27 mmol/L (ref 22–32)
CO2: 26 mmol/L (ref 22–32)
CREATININE: 0.45 mg/dL (ref 0.44–1.00)
CREATININE: 0.46 mg/dL (ref 0.44–1.00)
Chloride: 109 mmol/L (ref 101–111)
GFR calc Af Amer: >60 mL/min (ref >60)
GFR calc Af Amer: >60 mL/min (ref >60)
GFR calc non Af Amer: >60 mL/min (ref >60)
GFR calc non Af Amer: >60 mL/min (ref >60)
GLUCOSE: 95 mg/dL (ref 65–99)
Glucose, Bld: 85 mg/dL (ref 65–99)
Potassium: 4.1 mmol/L (ref 3.5–5.1)
Potassium: 3.7 mmol/L (ref 3.5–5.1)
Sodium: 145 mmol/L (ref 135–145)
Sodium: 144 mmol/L (ref 135–145)

## 2015-10-12 LAB — GLUCOSE, CAPILLARY
GLUCOSE-CAPILLARY: 70 mg/dL (ref 65–99)
Glucose-Capillary: 92 mg/dL (ref 65–99)
Glucose-Capillary: 75 mg/dL (ref 65–99)
Glucose-Capillary: 87 mg/dL (ref 65–99)
Glucose-Capillary: 91 mg/dL (ref 65–99)
Glucose-Capillary: 101 mg/dL — ABNORMAL HIGH (ref 65–99)

## 2015-10-12 LAB — CBC
HCT: 26.5 % — ABNORMAL LOW (ref 36.0–46.0)
Hemoglobin: 8.2 g/dL — ABNORMAL LOW (ref 12.0–15.0)
MCH: 26.6 pg (ref 26.0–34.0)
MCHC: 30.9 g/dL (ref 30.0–36.0)
MCV: 86 fL (ref 78.0–100.0)
PLATELETS: 207 10*3/uL (ref 150–400)
RBC: 3.08 MIL/uL — AB (ref 3.87–5.11)
RDW: 16 % — ABNORMAL HIGH (ref 11.5–15.5)
WBC: 17 10*3/uL — ABNORMAL HIGH (ref 4.0–10.5)

## 2015-10-12 LAB — TRIGLYCERIDES: Triglycerides: 95 mg/dL (ref <150)

## 2015-10-12 LAB — MAGNESIUM
MAGNESIUM: 2.1 mg/dL (ref 1.7–2.4)
Magnesium: 1.9 mg/dL (ref 1.7–2.4)

## 2015-10-12 MED ORDER — MAGNESIUM SULFATE 2 GM/50ML IV SOLN
2.0000 g | Freq: Once | INTRAVENOUS | Status: AC
  Administered 2015-10-12: 2 g via INTRAVENOUS
  Filled 2015-10-12: qty 50

## 2015-10-12 MED ORDER — FUROSEMIDE 10 MG/ML IJ SOLN
40.0000 mg | Freq: Two times a day (BID) | INTRAMUSCULAR | Status: DC
  Administered 2015-10-12 – 2015-10-16 (×8): 40 mg via INTRAVENOUS
  Filled 2015-10-12 (×8): qty 4

## 2015-10-12 MED ORDER — IOPAMIDOL (ISOVUE-300) INJECTION 61%
INTRAVENOUS | Status: AC
  Administered 2015-10-12: 75 mL
  Filled 2015-10-12: qty 75

## 2015-10-12 MED ORDER — FENTANYL CITRATE (PF) 2500 MCG/50ML IJ SOLN
100.0000 ug/h | Status: DC
  Administered 2015-10-12: 350 ug/h via INTRAVENOUS
  Administered 2015-10-13 – 2015-10-14 (×3): 400 ug/h via INTRAVENOUS
  Administered 2015-10-15: 250 ug/h via INTRAVENOUS
  Administered 2015-10-16 – 2015-10-17 (×3): 300 ug/h via INTRAVENOUS
  Administered 2015-10-18: 200 ug/h via INTRAVENOUS
  Administered 2015-10-18 – 2015-10-19 (×2): 300 ug/h via INTRAVENOUS
  Administered 2015-10-20 – 2015-10-21 (×5): 100 ug/h via INTRAVENOUS
  Filled 2015-10-12 (×15): qty 100

## 2015-10-12 NOTE — Plan of Care (Signed)
Problem: Pain Managment: Goal: General experience of comfort will improve Outcome: Not Progressing Pt is on Versed and Fentanyl max doses.   Problem: Physical Regulation: Goal: Will remain free from infection Outcome: Not Progressing Temp increased (cold therapy applied), Urine cloudy with sediments and red worm-like objects floating in it.  Problem: Skin Integrity: Goal: Risk for impaired skin integrity will decrease Outcome: Not Progressing Bruising, redness/rash, swelling noted as prior (no change)     Problem: Tissue Perfusion: Goal: Risk factors for ineffective tissue perfusion will decrease Outcome: Progressing SCDs are on, turns q2hr  Problem: Bowel/Gastric: Goal: Will not experience complications related to bowel motility Pt is on tube feedings, has loose stools. MD aware

## 2015-10-12 NOTE — Progress Notes (Signed)
eLink Physician-Brief Progress Note Patient Name: Cassandra ChouSamantha Wall DOB: 1980/11/09 MRN: [id number]   Date of Service  10/12/2015  HPI/Events of Note  Tachy - sinus  eICU Interventions  Treat fevers     Intervention Category Major Interventions: Arrhythmia - evaluation and management  Allena Pietila V. 10/12/2015, 4:14 PM

## 2015-10-12 NOTE — Progress Notes (Signed)
Pt is in distress. HR sustaining in 150-160, RR in 40-50s, Pt  ETT, oral suctioned, boluses Versed, Fentanyl given. Elink contacted, MD notified. Waiting for orders

## 2015-10-12 NOTE — Progress Notes (Signed)
PICC line no blood return noted. Phlebotomy called. MD notified

## 2015-10-12 NOTE — Progress Notes (Signed)
PULMONARY / CRITICAL CARE MEDICINE   Name: Cassandra Wall MRN: [id number] DOB: 16-Feb-1981    ADMISSION DATE:  10/04/2015 CONSULTATION DATE:  10/06/15  REFERRING MD:  Patel,P  CHIEF COMPLAINT: Hypertensive urgency related to opioid withdrawl  HISTORY OF PRESENT ILLNESS:  Cassandra Wall is a 35 year old female with past medical history significant for polysubstance abuse(iv heroine and cocaine), tobacco abuse who presented to the ED with complaints of left sided weakness which was going on for two days prior to the admission.  CT of the brain was concerning for right sided infarct She was evaluated by neurology and they think it is septic emboli from IVDU. She has track marks all over her body. From her note it appears that her brother passed away with endocarditis due to IVDU. Rt side endocarditis. vent  SUBJECTIVE:  Intubated, sedated, febrile overnight and today- high- 101.1.   VITAL SIGNS: BP 139/74 mmHg  Pulse 108  Temp(Src) 101.1 F (38.4 C) (Rectal)  Resp 25  Ht  (1.727 m)  Wt 169 lb 12.1 oz (77 kg)  BMI 25.82 kg/m2  SpO2 100%  HEMODYNAMICS:    VENTILATOR SETTINGS: Vent Mode:  [-] PRVC FiO2 (%):  [40 %] 40 % Set Rate:  [14 bmp] 14 bmp Vt Set:  [490 mL] 490 mL PEEP:  [5 cmH20] 5 cmH20 Plateau Pressure:  [15 cmH20-18 cmH20] 15 cmH20  INTAKE / OUTPUT: I/O last 3 completed shifts: In: 4592.4 [I.V.:2157.4; NG/GT:1485; IV Piggyback:950] Out: 4625 [Urine:4625]  PHYSICAL EXAMINATION: General:  Sedated on vent. No distress as sedated, RASS-   -4 Neuro: left sided weakness  HEENT:  Atraumatic, normocephalic, Pupils equal- ~59mm Cardiovascular: regular. No MRG Lungs:  equal breath sounds .  Abdomen:  Not Tender  no r/g, BS wnl Musculoskeletal:  No joint deformity, puffy upper extremity Skin: track marks all over the body, SCDs present  *LABS:  BMET  Recent Labs Lab 10/11/15 1138 10/11/15 1814 10/12/15 0400  NA 147* 144 145  K 3.6 4.1 4.1  CL  114* 115* 109  CO2 BUN CREATININE 0.47 0.40* 0.45  GLUCOSE 112* 112* 95    Electrolytes  Recent Labs Lab 10/08/15 1153 10/08/15 2131  10/09/15 0857  10/11/15 1138 10/11/15 1814 10/12/15 0400  CALCIUM  --   --   < >  --   < > 8.0* 7.5* 8.1*  MG 2.0 1.7  --  1.7  < > 1.6* 2.2 1.9  PHOS 4.4 4.6  --  4.1  --   --   --   --   < > = values in this interval not displayed.  CBC  Recent Labs Lab 10/10/15 0525 10/10/15 1542 10/12/15 0948  WBC 10.7* 12.8* 17.0*  HGB 6.5* 7.6* 8.2*  HCT 20.6* 23.7* 26.5*  PLT 98* 114* 207    Coag's  Recent Labs Lab 10/06/15 0832 10/07/15 0726 10/11/15 1138  APTT  --   --  31  INR 1.29 1.45 1.35    Sepsis Markers  Recent Labs Lab 10/06/15 1539 10/06/15 2248  LATICACIDVEN 2.6* 1.7    ABG  Recent Labs Lab 10/06/15 1255 10/06/15 2110 10/07/15 1217  PHART 7.416 7.316* 7.364  PCO2ART 17.9* 25.6* 29.3*  PO2ART 107* 129.0* 82.0    Liver Enzymes  Recent Labs Lab 10/08/15 0559 10/09/15 0450 10/10/15 0525  AST ALT ALKPHOS 83 79 65  BILITOT 0.8 0.8 0.8  ALBUMIN 1.6* 1.5* 1.3*    Cardiac Enzymes No results for input(s): TROPONINI, PROBNP in the last 168 hours.  Glucose  Recent Labs Lab 10/11/15 1600 10/11/15 1911 10/11/15 2328 10/12/15 0355 10/12/15 0829 10/12/15 1155  GLUCAP 88 83 92 75 70 87    Imaging Dg Chest Port 1 View  10/12/2015  CLINICAL DATA:  Respiratory failure. EXAM: PORTABLE CHEST 1 VIEW COMPARISON:  10/11/2015. FINDINGS: Interim removal of left IJ line. Endotracheal tube, NG tube, right PICC line in stable position. Heart size normal. Diffuse bilateral pulmonary infiltrates, progressive from prior exam. Small bilateral pleural effusions. No pneumothorax. IMPRESSION: 1. Removal of left IJ line. Remaining lines and tubes in stable position. 2. Progressive bilateral pulmonary infiltrates/edema. Low lung volumes with basilar atelectasis. Small bilateral pleural  effusions. Electronically Signed   By: Maisie Fus  Register   On: 10/12/2015 07:07  remarkably worse on the left today   STUDIES:  4/5 Chest CT W Contrast- Cavitary lesions- largest- on left - 31mm and on right- 36mm, bilat pleural effusions. 4/5 Head Ct with Contrast- Evolving, large subacute right MCA territory infarct with unchanged mass effect. 2. Small occipital infarcts better demonstrated on MRI.  4/1 MRI brain w Wo contrast- Large acute right MCA infarct, without hemorrhagic conversion or propagation. Evolving small bilateral occipital lobe infarcts. MRA HEAD: Mildly motion degraded examination. Acute RIGHT distal M1 occlusion consistent with embolic phenomena. 3/29 US renal . No hydronephrosis. 2. Increased parenchymal echogenicity bilaterally compatible with chronic medical renal disease. 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 No significant right-to-left shift or hemorrhagic transformation.BILATERAL occipital lobe hypodensities, subcentimeter size, could represent brain abscesses (versus acute subcortical infarcts). Consider continued surveillance with post infusion MRI. 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 EF of 50-55%  CULTURES: 3/28 BC > mssa 3/28 CSF-normal 3/30>>>  Staph aureus-MSSA again 4/3  B/c>>>  4/4 tracheal Cultures >>>  ANTIBIOTICS: Naficillin 3/30>>>  SIGNIFICANT EVENTS: 3/28 admitted 3/30 intubated. PCCM consult 4/4- Picc inserted, Central line removed.  LINES/TUBES: 3/30 ETT>>> 3/30 line >>>out   DISCUSSION: 35 year old female with h/o polysubstance abuse now presents with right sided septic emboli ddue to IVDU. Major issues are debilitation s/p CVA, agitation, NAG metabolic acidosis.This has improved. We will cont supportive care. Transition to precedex. Hope we may be able to extubate her soon. Still spiking fevers. Imaging ordered-  Head and chest.  ASSESSMENT / PLAN:  PULMONARY A: Acute Hypoxemic respiratory failure Multifocal PNA due to septic emboli Worsening airspace disease on left  Pulm edema P:   Full vent support  Need to control withdrawal.  Needs diuresis- IV lasix 40 BID Chest Ct- Shows septic emboli with cavity, with pleural effusions.  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  Needs diuresis- IV lasix  BID Q12H Bmet and MAg Goal K- >4, Mag >2.  GASTROINTESTINAL A:   ?mesentric ischemia possibly due to IVDU Anemia R/O GI bleed.   P:   tolerating feeds Protonix for GIP Check stool for blood > blood in stool Hgb Stable  HEMATOLOGIC A:   Anemia Thrombocytopenia r/t sepsis   P:  CBC in am scds for DVT prophylaxis Heparin sq held Observe Hb and Hct.   INFECTIOUS A:   MSSA endocarditis with septic emboli Suspected cellulitis involving the drug injection site Sputum G(+) cip Persistent intermittent fevers LEukocytosis- worsening P:   Continue naficillin as per ID, and assess if can clear this Appreciate ID recs Repeat BC >> Repeat respiratory culture today >> more secretions PICC placed, central line D/c 4/4.  Consider PSA coverage depending on gram stain with Ziosyn Consider Covering for anaerobes with cavities and effusions with Zosyn New imaging Ct head and chest- No drainage abcess/collection.  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  Encephalopathy  Heroine withdrawal  P:   RASS goal: -1 - 0 Cont fentanyl and versed drip. Appreciate Pharmacy help with AD/anxiolytics. Cont seroquel today on  top of Klonazepam Monitor QTc. Neurology Signed off 4/1   FAMILY  - Updates: mother and father updated. Potential trache.   - Inter-disciplinary family meet or Palliative Care meeting due by: 10/13/15  Critical care time spent on this pt : 35 minutes.   Inocente SallesEjiro Tashay Bozich, MD IMTS, PGY-3 [phone]

## 2015-10-12 NOTE — Progress Notes (Signed)
Pt transported to and from CT without incident.  Pt suctioned prior to and after transport. 

## 2015-10-13 ENCOUNTER — Inpatient Hospital Stay (HOSPITAL_COMMUNITY): Payer: Medicaid Other

## 2015-10-13 ENCOUNTER — Encounter (HOSPITAL_COMMUNITY): Payer: Medicaid Other

## 2015-10-13 DIAGNOSIS — I6389 Other cerebral infarction: Secondary | ICD-10-CM | POA: Insufficient documentation

## 2015-10-13 DIAGNOSIS — J156 Pneumonia due to other aerobic Gram-negative bacteria: Secondary | ICD-10-CM | POA: Insufficient documentation

## 2015-10-13 DIAGNOSIS — G060 Intracranial abscess and granuloma: Secondary | ICD-10-CM

## 2015-10-13 DIAGNOSIS — A4101 Sepsis due to Methicillin susceptible Staphylococcus aureus: Principal | ICD-10-CM

## 2015-10-13 DIAGNOSIS — J90 Pleural effusion, not elsewhere classified: Secondary | ICD-10-CM

## 2015-10-13 LAB — CBC
HCT: 25.5 % — ABNORMAL LOW (ref 36.0–46.0)
HCT: 26.2 % — ABNORMAL LOW (ref 36.0–46.0)
HEMATOCRIT: 22.5 % — AB (ref 36.0–46.0)
Hemoglobin: 7.1 g/dL — ABNORMAL LOW (ref 12.0–15.0)
Hemoglobin: 7.7 g/dL — ABNORMAL LOW (ref 12.0–15.0)
Hemoglobin: 8 g/dL — ABNORMAL LOW (ref 12.0–15.0)
MCH: 27.1 pg (ref 26.0–34.0)
MCH: 25.8 pg — AB (ref 26.0–34.0)
MCH: 25.7 pg — ABNORMAL LOW (ref 26.0–34.0)
MCHC: 30.2 g/dL (ref 30.0–36.0)
MCHC: 30.5 g/dL (ref 30.0–36.0)
MCHC: 31.6 g/dL (ref 30.0–36.0)
MCV: 85.9 fL (ref 78.0–100.0)
MCV: 84.5 fL (ref 78.0–100.0)
MCV: 85 fL (ref 78.0–100.0)
PLATELETS: 261 10*3/uL (ref 150–400)
PLATELETS: 235 10*3/uL (ref 150–400)
Platelets: 207 10*3/uL (ref 150–400)
RBC: 2.62 MIL/uL — ABNORMAL LOW (ref 3.87–5.11)
RBC: 3.1 MIL/uL — AB (ref 3.87–5.11)
RBC: 3 MIL/uL — ABNORMAL LOW (ref 3.87–5.11)
RDW: 15.9 % — AB (ref 11.5–15.5)
RDW: 16 % — AB (ref 11.5–15.5)
RDW: 16 % — AB (ref 11.5–15.5)
WBC: 13.7 10*3/uL — ABNORMAL HIGH (ref 4.0–10.5)
WBC: 16 10*3/uL — AB (ref 4.0–10.5)
WBC: 14.8 10*3/uL — AB (ref 4.0–10.5)

## 2015-10-13 LAB — GLUCOSE, CAPILLARY
GLUCOSE-CAPILLARY: 100 mg/dL — AB (ref 65–99)
GLUCOSE-CAPILLARY: 73 mg/dL (ref 65–99)
GLUCOSE-CAPILLARY: 78 mg/dL (ref 65–99)
GLUCOSE-CAPILLARY: 88 mg/dL (ref 65–99)
Glucose-Capillary: 84 mg/dL (ref 65–99)
Glucose-Capillary: 78 mg/dL (ref 65–99)

## 2015-10-13 LAB — MAGNESIUM
Magnesium: 1.8 mg/dL (ref 1.7–2.4)
Magnesium: 2 mg/dL (ref 1.7–2.4)
Magnesium: 2.2 mg/dL (ref 1.7–2.4)

## 2015-10-13 LAB — BASIC METABOLIC PANEL
Anion gap: 9 (ref 5–15)
Anion gap: 10 (ref 5–15)
Anion gap: 9 (ref 5–15)
BUN: 12 mg/dL (ref 6–20)
BUN: 12 mg/dL (ref 6–20)
BUN: 11 mg/dL (ref 6–20)
CALCIUM: 7.5 mg/dL — AB (ref 8.9–10.3)
CALCIUM: 8.1 mg/dL — AB (ref 8.9–10.3)
CHLORIDE: 104 mmol/L (ref 101–111)
CHLORIDE: 103 mmol/L (ref 101–111)
CO2: 27 mmol/L (ref 22–32)
CO2: 27 mmol/L (ref 22–32)
CO2: 28 mmol/L (ref 22–32)
CREATININE: 0.4 mg/dL — AB (ref 0.44–1.00)
CREATININE: 0.47 mg/dL (ref 0.44–1.00)
Calcium: 8.1 mg/dL — ABNORMAL LOW (ref 8.9–10.3)
Chloride: 104 mmol/L (ref 101–111)
Creatinine, Ser: 0.54 mg/dL (ref 0.44–1.00)
GFR calc Af Amer: >60 mL/min (ref >60)
GFR calc Af Amer: >60 mL/min (ref >60)
GFR calc non Af Amer: >60 mL/min (ref >60)
GFR calc non Af Amer: >60 mL/min (ref >60)
GFR calc non Af Amer: >60 mL/min (ref >60)
GLUCOSE: 98 mg/dL (ref 65–99)
GLUCOSE: 99 mg/dL (ref 65–99)
Glucose, Bld: 103 mg/dL — ABNORMAL HIGH (ref 65–99)
POTASSIUM: 4 mmol/L (ref 3.5–5.1)
Potassium: 2.9 mmol/L — ABNORMAL LOW (ref 3.5–5.1)
Potassium: 4 mmol/L (ref 3.5–5.1)
Sodium: 139 mmol/L (ref 135–145)
Sodium: 141 mmol/L (ref 135–145)
Sodium: 141 mmol/L (ref 135–145)

## 2015-10-13 LAB — CULTURE, RESPIRATORY

## 2015-10-13 LAB — PROTIME-INR
INR: 1.33 (ref 0.00–1.49)
Prothrombin Time: 16.6 seconds — ABNORMAL HIGH (ref 11.6–15.2)

## 2015-10-13 LAB — CULTURE, RESPIRATORY W GRAM STAIN

## 2015-10-13 LAB — APTT: APTT: 34 s (ref 24–37)

## 2015-10-13 LAB — PREPARE RBC (CROSSMATCH)

## 2015-10-13 MED ORDER — POTASSIUM CHLORIDE CRYS ER 20 MEQ PO TBCR
80.0000 meq | EXTENDED_RELEASE_TABLET | Freq: Once | ORAL | Status: AC
  Administered 2015-10-13: 80 meq via ORAL
  Filled 2015-10-13: qty 4

## 2015-10-13 MED ORDER — DIPHENHYDRAMINE HCL 50 MG/ML IJ SOLN
INTRAMUSCULAR | Status: AC
  Filled 2015-10-13: qty 1

## 2015-10-13 MED ORDER — VECURONIUM BROMIDE 10 MG IV SOLR
10.0000 mg | Freq: Once | INTRAVENOUS | Status: AC
  Administered 2015-10-13: 7 mg via INTRAVENOUS

## 2015-10-13 MED ORDER — FENTANYL CITRATE (PF) 100 MCG/2ML IJ SOLN
200.0000 ug | Freq: Once | INTRAMUSCULAR | Status: DC
  Filled 2015-10-13: qty 4

## 2015-10-13 MED ORDER — VITAL AF 1.2 CAL PO LIQD
1000.0000 mL | ORAL | Status: DC
  Administered 2015-10-14 – 2015-10-24 (×11): 1000 mL
  Filled 2015-10-13 (×3): qty 1000

## 2015-10-13 MED ORDER — ETOMIDATE 2 MG/ML IV SOLN
40.0000 mg | Freq: Once | INTRAVENOUS | Status: DC
  Filled 2015-10-13: qty 20

## 2015-10-13 MED ORDER — DIPHENHYDRAMINE HCL 50 MG/ML IJ SOLN
25.0000 mg | Freq: Once | INTRAMUSCULAR | Status: AC
  Administered 2015-10-13: 25 mg via INTRAVENOUS

## 2015-10-13 MED ORDER — MEROPENEM 1 G IV SOLR
2.0000 g | Freq: Three times a day (TID) | INTRAVENOUS | Status: DC
  Administered 2015-10-13 – 2015-10-17 (×12): 2 g via INTRAVENOUS
  Filled 2015-10-13 (×16): qty 2

## 2015-10-13 MED ORDER — SODIUM CHLORIDE 0.9 % IV SOLN: Freq: Once | INTRAVENOUS | Status: DC

## 2015-10-13 MED ORDER — POTASSIUM CHLORIDE 10 MEQ/100ML IV SOLN
10.0000 meq | INTRAVENOUS | Status: AC
  Administered 2015-10-13 (×4): 10 meq via INTRAVENOUS
  Filled 2015-10-13 (×4): qty 100

## 2015-10-13 MED ORDER — MIDAZOLAM HCL 2 MG/2ML IJ SOLN
4.0000 mg | Freq: Once | INTRAMUSCULAR | Status: AC
  Administered 2015-10-17: 4 mg via INTRAVENOUS
  Filled 2015-10-13 (×2): qty 4

## 2015-10-13 MED ORDER — MAGNESIUM SULFATE 2 GM/50ML IV SOLN
2.0000 g | Freq: Once | INTRAVENOUS | Status: AC
  Administered 2015-10-13: 2 g via INTRAVENOUS
  Filled 2015-10-13: qty 50

## 2015-10-13 NOTE — Procedures (Signed)
Bronchoscopy Procedure Note   [name] [id number] August 20, 1980   Procedure: Bronchoscopy Indications: Tracheostomy placement   Procedure Details Consent: Risks of procedure as well as the alternatives and risks of each were explained to the (patient/caregiver).  Consent for procedure obtained. Time Out: Verified patient identification, verified procedure, site/side was marked, verified correct patient position, special equipment/implants available, medications/allergies/relevent history reviewed, required imaging and test results available.  Performed  In preparation for procedure, patient was given 100% FiO2 and bronchoscope lubricated. Sedation: Benzodiazepines, Muscle relaxants and Etomidate  Airway entered for visualization during percutaneous tracheostomy placement.  Wire, punch dilator and trach visualized.  Bronchoscopy then moved to trach and carina visualized.   Bronchoscope removed. Patient placed back on 100% FiO2 at conclusion of procedure.    Evaluation Hemodynamic Status: BP stable throughout; O2 sats: stable throughout Patient's Current Condition: stable Specimens:  None Complications: No apparent complications Patient did tolerate procedure well.  Performed under direct MD supervision.   Dirk DressKaty Aryka Coonradt, NP 10/13/2015  3:01 PM

## 2015-10-13 NOTE — Progress Notes (Signed)
Pharmacy Antibiotic Note  Cassandra ChouSamantha Wall is a 35 y.o. female admitted on 10/04/2015 with AMS, drug abuse treating MSSA endocarditis .  Pharmacy has been consulted for meropenem dosing.  Plan: Meropenem 2g q8h F/u renal function, LOT  Height: 5\' 8"  (172.7 cm) Weight: 170 lb 6.7 oz (77.3 kg) IBW/kg (Calculated) : 63.9  Temp (24hrs), Avg:100 F (37.8 C), Min:97.7 F (36.5 C), Max:102.7 F (39.3 C)   Recent Labs Lab 10/06/15 1539  10/06/15 2248  10/09/15 0450 10/10/15 0525 10/10/15 1542  10/11/15 1814 10/12/15 0400 10/12/15 0948 10/12/15 1459 10/13/15 0029 10/13/15 0436 10/13/15 0641  WBC  --   < >  --   < > 11.8* 10.7* 12.8*  --   --   --  17.0*  --   --   --  13.7*  CREATININE  --   < >  --   < > 0.59 0.58  --   < > 0.40* 0.45  --  0.46 0.40* 0.54  --   LATICACIDVEN 2.6*  --  1.7  --   --   --   --   --   --   --   --   --   --   --   --   < > = values in this interval not displayed.  Estimated Creatinine Clearance: 109.4 mL/min (by C-G formula based on Cr of 0.54).    Allergies  Allergen Reactions  . Wheat Anaphylaxis  . Ciprofloxacin Hcl     Reports as resistant   . Latex Hives, Itching and Rash    Antimicrobials this admission: 3/28 Vanc>>3/30  3/28 Rocephin x 1 dose  3/28 Ampicillinx 1 dose  3/28 Cefepime >>3/29  3/29 Cefazolin >>3/30  3/30 nafcillin >> 04/06 04/06 meropenem >>  Microbiology results: 3/28 BCx: MSSA  3/28 CSF Cx - NEG  3/28 MRSA PCR neg  3/30 BCx2>> MSSA  04/03 BC x 2>> ngtd  04/04 resp cx --> reincubated  Thank you for allowing pharmacy to be a part of this patient's care.  [place] PinkGazda, Dorotea Hand P, PharmD 10/13/2015 9:46 AM

## 2015-10-13 NOTE — Procedures (Signed)
Name:  Cassandra Wall MRN:  [id number] DOB:  [birth year]  OPERATIVE NOTE  Procedure:  Percutaneous tracheostomy.  Indications:  Ventilator-dependent respiratory failure.  Consent:  Procedure, alternatives, risks and benefits discussed with medical POA.  Questions answered.  Consent obtained.  Anesthesia: fent, versed, prop, venc  Procedure summary:  Appropriate equipment was assembled.  The patient was identified as Cassandra Wall and safety timeout was performed. The patient was placed in supine position with a towel roll behind shoulder blades and neck extended.  Sterile technique was used. The patient's neck and upper chest were prepped using chlorhexidine / alcohol scrub and the field was draped in usual sterile fashion with full body drape. After the adequate sedation / anesthesia was achieved, attention was directed at the midline trachea, where the cricothyroid membrane was palpated. Approximately two fingerbreadths above the sternal notch, a verticle incision was created with a scalpel after local infiltration with 0.2% Lidocaine. Then, using Seldinger technique and a percutaneous tracheostomy set, the trachea was entered with a 14 gauge needle with an overlying sheath. This was all confirmed under direct visualization of a fiberoptic flexible bronchoscope. Entrance into the trachea was identified through the third tracheal ring interspace. Following this, a guidewire was inserted. The needle was removed, leaving the sheath and the guidewire intact. Next, the sheath was removed and a small dilator was inserted. The tracheal rings were then dilated. A #6 Shiley was then opened. The balloon was checked. It was placed over a tracheal dilator, which was then advanced over the guidewire and through the previously dilated tract. The Shiley tracheostomy tube was noted to pass in the trachea with little resistance. The guidewire and dilator tubes were removed from the trachea. An inner  cannula was placed through the tracheostomy tube. The tracheostomy was then secured at the anterior neck with 4 monofilament sutures. The oral endotracheal tube was removed and the ventilator was attached to the newly placed tracheostomy tube. Adequate tidal volumes were noted. The cuff was inflated and no evidence of air leak was noted. No evidence of bleeding was noted. At this point, the procedure was concluded. Post-procedure chest x-ray was ordered.  Complications:  No immediate complications were noted.  Hemodynamic parameters and oxygenation remained stable throughout the procedure.  Estimated blood loss:  Less then 5 mL.  Nelda BucksFEINSTEIN,Pilot Prindle J., MD Pulmonary and Critical Care Medicine [facility] Pager: [phone]  10/13/2015, 3:21 PM   Can follow up with [facility]  [phone]

## 2015-10-13 NOTE — Progress Notes (Addendum)
Per verbal order of Dr. Tyson AliasFeinstein to bolus 100 mcg of fentanyl from bag and bolus 4 mg of versed from continous infusion, 7 mg of vecuronium given, propofol ordered to be started at 25 mcg/kg/min and stopped 45 minutes post vecuronium push.  Corliss SkainsJuan Shatonya Passon RN

## 2015-10-13 NOTE — Procedures (Signed)
Bedside Tracheostomy Insertion Procedure Note   Patient Details:   Name: Cassandra Wall DOB: [birth year] MRN: [id number]  Procedure: Tracheostomy  Pre Procedure Assessment: ET Tube Size:7.5 ET Tube secured at lip (cm):22 Bite block in place: Yes Breath Sounds: Clear  Post Procedure Assessment: BP 150/102 mmHg  Pulse 111  Temp(Src) 100.7 F (38.2 C) (Core (Comment))  Resp 21  Ht 5\' 8"  (1.727 m)  Wt 170 lb 6.7 oz (77.3 kg)  BMI 25.92 kg/m2  SpO2 100%  LMP  (LMP Unknown) O2 sats: stable throughout Complications: No apparent complications Patient did tolerate procedure well Tracheostomy Brand:Shiley Tracheostomy Style:Cuffed Tracheostomy Size: 6.0 Tracheostomy Secured WUJ:[id number]:Sutures Tracheostomy Placement Confirmation:Trach cuff visualized and in place and Chest X ray ordered for placement    Loletta SpecterSmith, Megan Hayduk Sands 10/13/2015, 3:24 PM

## 2015-10-13 NOTE — Progress Notes (Signed)
Subjective: Intubated and sedated on the ventilator  Per RN she was able to follow commands this am on the right side   Antibiotics:  Anti-infectives    Start     Dose/Rate Route Frequency Ordered Stop   10/13/15 1400  meropenem (MERREM) 2 g in sodium chloride 0.9 % 100 mL IVPB     2 g 200 mL/hr over 30 Minutes Intravenous 3 times per day 10/13/15 0946     10/06/15 1200  ceFAZolin (ANCEF) IVPB 2g/100 mL premix  Status:  Discontinued    Comments:  Pharmacy may adjust   2 g 200 mL/hr over 30 Minutes Intravenous 3 times per day 10/06/15 1019 10/06/15 1019   10/06/15 1200  nafcillin 2 g in dextrose 5 % 100 mL IVPB  Status:  Discontinued     2 g 200 mL/hr over 30 Minutes Intravenous 6 times per day 10/06/15 1042 10/13/15 0946   10/06/15 1100  nafcillin injection 2 g  Status:  Discontinued    Comments:  Pharmacy may adjust   2 g Intravenous Every 4 hours 10/06/15 1019 10/06/15 1042   10/05/15 1100  ceFAZolin (ANCEF) IVPB 2g/100 mL premix  Status:  Discontinued    Comments:  Pharmacy may adjust   2 g 200 mL/hr over 30 Minutes Intravenous Every 12 hours 10/05/15 0959 10/06/15 1019   10/05/15 0500  ceFEPIme (MAXIPIME) 2 g in dextrose 5 % 50 mL IVPB  Status:  Discontinued     2 g 100 mL/hr over 30 Minutes Intravenous Every 24 hours 10/04/15 1444 10/05/15 0959   10/04/15 1215  ampicillin (OMNIPEN) 2 g in sodium chloride 0.9 % 50 mL IVPB     2 g 150 mL/hr over 20 Minutes Intravenous NOW 10/04/15 1208 10/04/15 1305   10/04/15 0530  ceFEPIme (MAXIPIME) 2 g in dextrose 5 % 50 mL IVPB     2 g 100 mL/hr over 30 Minutes Intravenous  Once 10/04/15 0529 10/04/15 0612   10/04/15 0445  cefTRIAXone (ROCEPHIN) 2 g in dextrose 5 % 50 mL IVPB     2 g 100 mL/hr over 30 Minutes Intravenous  Once 10/04/15 0441 10/04/15 0529   10/04/15 0445  vancomycin (VANCOCIN) 2,000 mg in sodium chloride 0.9 % 500 mL IVPB     2,000 mg 250 mL/hr over 120 Minutes Intravenous  Once 10/04/15 0441 10/04/15 1234    10/04/15 0445  ampicillin (OMNIPEN) 2 g in sodium chloride 0.9 % 50 mL IVPB  Status:  Discontinued     2 g 150 mL/hr over 20 Minutes Intravenous  Once 10/04/15 0441 10/04/15 0532      Medications: Scheduled Meds: . sodium chloride   Intravenous Once  . sodium chloride   Intravenous Once  . sodium chloride   Intravenous Once  . sodium chloride   Intravenous Once  . antiseptic oral rinse  7 mL Mouth Rinse QID  . chlorhexidine gluconate (SAGE KIT)  15 mL Mouth Rinse BID  . clonazePAM  1 mg Per Tube TID  . etomidate  40 mg Intravenous Once  . fentaNYL (SUBLIMAZE) injection  200 mcg Intravenous Once  . furosemide  40 mg Intravenous BID  . meropenem (MERREM) IV  2 g Intravenous 3 times per day  . midazolam  4 mg Intravenous Once  . pantoprazole sodium  40 mg Per Tube Q24H  . QUEtiapine  50 mg Oral BID  . sodium chloride flush  10-40 mL Intracatheter Q12H  .  vecuronium  10 mg Intravenous Once   Continuous Infusions: . sodium chloride Stopped (10/08/15 0000)  . dexmedetomidine Stopped (10/09/15 2156)  . feeding supplement (VITAL AF 1.2 CAL)    . fentaNYL infusion INTRAVENOUS 400 mcg/hr (10/13/15 0223)  . midazolam (VERSED) infusion 10 mg/hr (10/13/15 0822)   PRN Meds:.acetaminophen (TYLENOL) oral liquid 160 mg/5 mL, docusate, fentaNYL (SUBLIMAZE) injection, [DISCONTINUED] ondansetron **OR** ondansetron (ZOFRAN) IV, sodium chloride flush    Objective: Weight change: 10.6 oz (0.3 kg)  Intake/Output Summary (Last 24 hours) at 10/13/15 1458 Last data filed at 10/13/15 1430  Gross per 24 hour  Intake 2536.47 ml  Output   4875 ml  Net -2338.53 ml   Blood pressure 132/83, pulse 103, temperature 100.7 F (38.2 C), temperature source Core (Comment), resp. rate 21, height 5' 8"  (1.727 m), weight 170 lb 6.7 oz (77.3 kg), SpO2 100 %. Temp:  [97.7 F (36.5 C)-102.7 F (39.3 C)] 100.7 F (38.2 C) (04/06 1430) Pulse Rate:  [62-154] 103 (04/06 1430) Resp:  [0-41] 21 (04/06 1430) BP:  (89-149)/(53-107) 132/83 mmHg (04/06 1430) SpO2:  [95 %-100 %] 100 % (04/06 1430) FiO2 (%):  [40 %] 40 % (04/06 1104) Weight:  [170 lb 6.7 oz (77.3 kg)] 170 lb 6.7 oz (77.3 kg) (04/06 0500)  Physical Exam: General: Intubated and sedated. HEENT: anicteric sclera, pupils reactive to light and accommodation, EOMI CVS regular rate, could not hear a murmur Chest:rhonchi throughout Abdomen: soft nontender, nondistended, normal bowel sounds, Extremities: no  clubbing or edema noted bilaterally Skin: Numerous injection sites throughout her body, PICC line in place , IJ out Neuro: nonfocal  CBC:  CBC Latest Ref Rng 10/13/2015 10/12/2015 10/10/2015  WBC 4.0 - 10.5 K/uL 13.7(H) 17.0(H) 12.8(H)  Hemoglobin 12.0 - 15.0 g/dL 7.1(L) 8.2(L) 7.6(L)  Hematocrit 36.0 - 46.0 % 22.5(L) 26.5(L) 23.7(L)  Platelets 150 - 400 K/uL 207 207 114(L)       BMET  Recent Labs  10/13/15 0436 10/13/15 1208  NA 141 141  K 4.0 4.0  CL 104 104  CO2 27 28  GLUCOSE 98 99  BUN 12 11  CREATININE 0.54 0.47  CALCIUM 8.1* 8.1*     Liver Panel  No results for input(s): PROT, ALBUMIN, AST, ALT, ALKPHOS, BILITOT, BILIDIR, IBILI in the last 72 hours.     Sedimentation Rate No results for input(s): ESRSEDRATE in the last 72 hours. C-Reactive Protein No results for input(s): CRP in the last 72 hours.  Micro Results: Recent Results (from the past 720 hour(s))  Culture, blood (single)     Status: None   Collection Time: 10/04/15  4:46 AM  Result Value Ref Range Status   Specimen Description BLOOD LEFT HIP  Final   Special Requests IN PEDIATRIC BOTTLE 5CC  Final   Culture  Setup Time   Final    GRAM POSITIVE COCCI IN CLUSTERS AEROBIC BOTTLE ONLY CRITICAL RESULT CALLED TO, READ BACK BY AND VERIFIED WITHOneta Rack YB 0175 10/04/15 A BROWNING    Culture   Final    STAPHYLOCOCCUS AUREUS Performed at [facility]    Report Status 10/06/2015 FINAL  Final   Organism ID, Bacteria STAPHYLOCOCCUS AUREUS   Final      Susceptibility   Staphylococcus aureus - MIC*    CIPROFLOXACIN <=0.5 SENSITIVE Sensitive     ERYTHROMYCIN <=0.25 SENSITIVE Sensitive     GENTAMICIN <=0.5 SENSITIVE Sensitive     OXACILLIN <=0.25 SENSITIVE Sensitive     TETRACYCLINE <=1 SENSITIVE Sensitive  VANCOMYCIN 1 SENSITIVE Sensitive     TRIMETH/SULFA <=10 SENSITIVE Sensitive     CLINDAMYCIN <=0.25 SENSITIVE Sensitive     RIFAMPIN <=0.5 SENSITIVE Sensitive     Inducible Clindamycin NEGATIVE Sensitive     * STAPHYLOCOCCUS AUREUS  CSF culture     Status: None   Collection Time: 10/04/15  6:00 AM  Result Value Ref Range Status   Specimen Description CSF  Final   Special Requests Normal  Final   Gram Stain   Final    CYTOSPIN WBC PRESENT,BOTH PMN AND MONONUCLEAR NO ORGANISMS SEEN Gram Stain Report Called to,Read Back By and Verified With: A. DENNIS RN AT 0700 ON 03.28.17 BY SHUEA    Culture   Final    NO GROWTH 3 DAYS Performed at [facility]    Report Status 10/07/2015 FINAL  Final  Anaerobic culture     Status: None   Collection Time: 10/04/15  6:00 AM  Result Value Ref Range Status   Specimen Description CSF  Final   Special Requests NONE  Final   Gram Stain   Final    WBC PRESENT,BOTH PMN AND MONONUCLEAR NO ORGANISMS SEEN CYTOSPIN SMEAR    Culture   Final    NO ANAEROBES ISOLATED Performed at [facility]    Report Status 10/09/2015 FINAL  Final  MRSA PCR Screening     Status: None   Collection Time: 10/04/15 10:52 PM  Result Value Ref Range Status   MRSA by PCR NEGATIVE NEGATIVE Final    Comment:        The GeneXpert MRSA Assay (FDA approved for NASAL specimens only), is one component of a comprehensive MRSA colonization surveillance program. It is not intended to diagnose MRSA infection nor to guide or monitor treatment for MRSA infections.   Culture, blood (routine x 2)     Status: None   Collection Time: 10/06/15  8:34 AM  Result Value Ref Range Status   Specimen  Description BLOOD RIGHT ANTECUBITAL  Final   Special Requests IN PEDIATRIC BOTTLE 1CC  Final   Culture  Setup Time   Final    GRAM POSITIVE COCCI IN CLUSTERS AEROBIC BOTTLE ONLY CRITICAL RESULT CALLED TO, READ BACK BY AND VERIFIED WITH: S WHITE,RN @0247  10/07/15 MKELLY    Culture   Final    STAPHYLOCOCCUS AUREUS SUSCEPTIBILITIES PERFORMED ON PREVIOUS CULTURE WITHIN THE LAST 5 DAYS.    Report Status 10/08/2015 FINAL  Final  Culture, blood (routine x 2)     Status: None   Collection Time: 10/06/15  8:37 AM  Result Value Ref Range Status   Specimen Description BLOOD LEFT HAND  Final   Special Requests IN PEDIATRIC BOTTLE 2CC  Final   Culture  Setup Time   Final    GRAM POSITIVE COCCI IN CLUSTERS IN PEDIATRIC BOTTLE CRITICAL RESULT CALLED TO, READ BACK BY AND VERIFIED WITH: L WILSON,RN AT 2336 10/07/15 BY L BENFIELD    Culture   Final    STAPHYLOCOCCUS AUREUS SUSCEPTIBILITIES PERFORMED ON PREVIOUS CULTURE WITHIN THE LAST 5 DAYS.    Report Status 10/08/2015 FINAL  Final  Culture, respiratory (NON-Expectorated)     Status: None   Collection Time: 10/08/15  1:17 PM  Result Value Ref Range Status   Specimen Description TRACHEAL ASPIRATE  Final   Special Requests NONE  Final   Gram Stain   Final    ABUNDANT WBC PRESENT, PREDOMINANTLY PMN FEW SQUAMOUS EPITHELIAL CELLS PRESENT ABUNDANT YEAST FEW GRAM POSITIVE  COCCI IN PAIRS IN CLUSTERS IN CHAINS THIS SPECIMEN IS ACCEPTABLE FOR SPUTUM CULTURE Performed at [facility]    Culture   Final    MODERATE CANDIDA ALBICANS Performed at [facility]    Report Status 10/11/2015 FINAL  Final  Culture, blood (Routine X 2) w Reflex to ID Panel     Status: None (Preliminary result)   Collection Time: 10/10/15  4:04 PM  Result Value Ref Range Status   Specimen Description BLOOD RIGHT FOOT  Final   Special Requests IN PEDIATRIC BOTTLE 0.75CC  Final   Culture NO GROWTH 3 DAYS  Final   Report Status PENDING  Incomplete  Culture,  blood (Routine X 2) w Reflex to ID Panel     Status: None (Preliminary result)   Collection Time: 10/10/15  4:10 PM  Result Value Ref Range Status   Specimen Description BLOOD LEFT FOOT  Final   Special Requests IN PEDIATRIC BOTTLE 0.75CC  Final   Culture NO GROWTH 3 DAYS  Final   Report Status PENDING  Incomplete  Culture, respiratory (NON-Expectorated)     Status: None   Collection Time: 10/11/15 11:30 AM  Result Value Ref Range Status   Specimen Description TRACHEAL ASPIRATE  Final   Special Requests NONE  Final   Gram Stain   Final    FEW WBC PRESENT, PREDOMINANTLY PMN RARE SQUAMOUS EPITHELIAL CELLS PRESENT FEW GRAM POSITIVE COCCI IN PAIRS RARE GRAM NEGATIVE RODS RARE GRAM NEGATIVE COCCI Performed at [facility]    Culture   Final    MORAXELLA CATARRHALIS(BRANHAMELLA) Note: BETA LACTAMASE NEGATIVE Performed at [facility]    Report Status 10/13/2015 FINAL  Final    Studies/Results: Ct Head W Wo Contrast  10/12/2015  CLINICAL DATA:  Acute infarcts on recent MRI.  IV drug use. EXAM: CT HEAD WITHOUT AND WITH CONTRAST TECHNIQUE: Contiguous axial images were obtained from the base of the skull through the vertex without and with intravenous contrast CONTRAST:  75 mL Omnipaque 300 COMPARISON:  Brain MRI 10/08/2015 FINDINGS: A large right MCA infarct is again seen without evidence of interval extension/enlargement. There is a small amount of petechial hemorrhage and/or MCA branch vessel hyperattenuation from thrombus. No space-occupying parenchymal hematoma is seen. There is prominent gyriform enhancement throughout the area of infarction. Orbits are unremarkable. Visualized paranasal sinuses are clear. There is trace left mastoid fluid. No acute osseous abnormality is identified. Mild mass effect on the right lateral ventricle and at most trace leftward midline shift are unchanged. Mild hypoattenuation is noted in the occipital lobes related to the known small infarcts,  better demonstrated on MRI. No new infarct or extra-axial fluid collection is seen. IMPRESSION: 1. Evolving, large subacute right MCA territory infarct with unchanged mass effect. 2. Small occipital infarcts better demonstrated on MRI. Electronically Signed   By: Logan Bores M.D.   On: 10/12/2015 14:08   Ct Chest W Contrast  10/12/2015  CLINICAL DATA:  Fevers of unknown origin an, history of IV drug use EXAM: CT CHEST WITH CONTRAST TECHNIQUE: Multidetector CT imaging of the chest was performed during intravenous contrast administration. CONTRAST:  75 mL ISOVUE-300 IOPAMIDOL (ISOVUE-300) INJECTION 61% COMPARISON:  10/12/2015 FINDINGS: The lungs are well aerated bilaterally but demonstrate by the lateral lower lobe consolidation consistent with acute pneumonic infiltrate. Bilateral pleural effusions are noted right greater than left. Additionally scattered throughout both lungs are soft tissue mass lesions which show evidence of cavitation. Given the patient's clinical history and known  history of tricuspid vegetations, these are consistent with septic emboli. Largest of these on the right lies in the inferior aspect of the right upper lobe and measures approximately 36 in greatest dimension. This is best seen on image number 30 of series 5. The largest of these on the left lies in the lingula best seen on image number 27 of series 5 measuring approximately 31 mm in greatest length. Some peripheral consolidation adjacent to this lesion is noted. The thoracic inlet demonstrates an endotracheal tube and nasogastric catheter in satisfactory position. A right-sided PICC line is noted extending into the distal superior vena cava. Mildly prominent hilar lymph nodes are noted likely of reactive nature. These measure approximately 13 mm in greatest dimension in the right hila and slightly smaller in the left hilum. Cardiac structures appear within normal limits. No coronary calcifications are seen. The visualized upper  abdomen shows no acute abnormality. The osseous structures show no acute abnormality. IMPRESSION: Changes most consistent with bilateral septic emboli with cavitation given the clinical history and known hit findings of tricuspid valve vegetations. Additionally there are bilateral lower lobe pneumonic infiltrates with associated pleural effusions. Electronically Signed   By: Inez Catalina M.D.   On: 10/12/2015 13:55   Dg Chest Port 1 View  10/13/2015  CLINICAL DATA:  Respiratory failure. EXAM: PORTABLE CHEST 1 VIEW COMPARISON:  CT 10/12/2015.  Chest x-ray 10/12/2015. FINDINGS: Endotracheal tube, NG tube, right PICC line in stable position. Cardiomegaly with normal pulmonary vascularity. Persistent prominent bilateral pulmonary infiltrates are again noted. These are noted be cavitary on prior CT. Persistent bilateral pleural effusions. No pneumothorax. IMPRESSION: 1. Lines and tubes in stable position. 2. Persistent bilateral diffuse pulmonary infiltrates are again noted. These infiltrates are noted be cavitary on prior CT. Bilateral pleural effusions also again noted . Electronically Signed   By: Marcello Moores  Register   On: 10/13/2015 07:27   Dg Chest Port 1 View  10/12/2015  CLINICAL DATA:  Respiratory failure. EXAM: PORTABLE CHEST 1 VIEW COMPARISON:  10/11/2015. FINDINGS: Interim removal of left IJ line. Endotracheal tube, NG tube, right PICC line in stable position. Heart size normal. Diffuse bilateral pulmonary infiltrates, progressive from prior exam. Small bilateral pleural effusions. No pneumothorax. IMPRESSION: 1. Removal of left IJ line. Remaining lines and tubes in stable position. 2. Progressive bilateral pulmonary infiltrates/edema. Low lung volumes with basilar atelectasis. Small bilateral pleural effusions. Electronically Signed   By: Marcello Moores  Register   On: 10/12/2015 07:07      Assessment/Plan:  INTERVAL HISTORY:   10/10/15: patient with persistent fevers 10/11/15: PICC placed and now IJ  removed 4/5 --10/13/15: still febrile, CT shows bilateral cavitating septic emboli areas, bilateral pleural effusions, Tracheal aspirate is growing GNR  CT head shows Evolving, large subacute right MCA territory infarct with unchanged mass effect.  Principal Problem:   Sepsis ([place]) secondary to endocarditis in an IV drug abuser Active Problems:   IV drug abuse   Acute renal failure (ARF) (HCC)   Thrombocytopenia (HCC)   Septic embolism (HCC)   Acute embolic stroke (HCC)   Prolonged Q-T interval on ECG   Multifocal Pneumonia, due to septic emboli   Anemia   Stroke (cerebrum) (HCC)   Pressure ulcer   Acute and subacute infective endocarditis in diseases classified elsewhere   Acute respiratory failure with hypoxia (HCC)   Altered mental status   Endotracheally intubated   AKI (acute kidney injury) ([place])   Brain abscess   PNA (pneumonia)   Septic shock ([place])  Staphylococcus aureus bacteremia with sepsis ([place])   Acute respiratory failure ([place])   Encounter for orogastric (OG) tube placement   PICC (peripherally inserted central catheter) in place    Cassandra Wall is a 35 y.o. 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  On the other hand it was  good news to her that she was able to follow commands earlier today  I don't think CT surgery would replace her valve at this point   #2 Worsening cavitation in lungs with pleural effusions infiltrates: there is concern by primary team for Gram Negative infection and in the pleural space. Note her gram-negative rod turns out to be a MR axilla species that is not at beta-lactamase producer. Note  if we only wanted to target this species we could go to ceftriaxone high dose to also cover this and the MSSA. We would have to add flagyl for anerobic coverage though and there is problem with QT prolongation here  --I am ok with continuing the merrem for now given it does all that is requested by CCM in terms of coverage withotu QT prolongation issues and it would have good CNS penetration.  #4 Evolving CNS infarct: this is not good news.   #3 IVDU: enormous problem. Hopefully she survives to be able to then come to terms with this. She has very high mortality if she cannot completely stop this habit  #4 Chronic HCV without hepatic coma: Can try to cure her when she is outside the hospital. Medicaid would insist that she is drug free  #5 Candida in sputum: of no consequence unless as maker of esophagitis  #6 QT prolongation: will need to be very careful with meds     LOS: 9 days   Alcide Evener 10/13/2015, 2:58 PM

## 2015-10-13 NOTE — Progress Notes (Signed)
Dr. Christene Slatese Dios ordered 1 unit PRBC, patient temperature was 101.4 prior to administration and tachycardic in the 126 HR, MD made aware and ordered to give 650 acetaminophen and 25 mg diphenhydramine. Will monitor closely.  Corliss SkainsJuan Bryah Ocheltree RN

## 2015-10-13 NOTE — Progress Notes (Signed)
PULMONARY / CRITICAL CARE MEDICINE   Name: Cassandra Wall MRN: [id number] DOB: [birth year]    ADMISSION DATE:  10/04/2015 CONSULTATION DATE:  10/06/15  REFERRING MD:  Patel,P  CHIEF COMPLAINT: Hypertensive urgency related to opioid withdrawl  HISTORY OF PRESENT ILLNESS:  Cassandra Wall is a 35 year old female with past medical history significant for polysubstance abuse(iv heroine and cocaine), tobacco abuse who presented to the ED with complaints of left sided weakness which was going on for two days prior to the admission.  CT of the brain was concerning for right sided infarct She was evaluated by neurology and they think it is septic emboli from IVDU. She has track marks all over her body. From her note it appears that her brother passed away with endocarditis due to IVDU. Rt side endocarditis.  SUBJECTIVE:  Intubated, sedated, febrile overnight and today- temp now 100.7.   VITAL SIGNS: BP 132/83 mmHg  Pulse 103  Temp(Src) 100.7 F (38.2 C) (Core (Comment))  Resp 21  Ht  (1.727 m)  Wt 170 lb 6.7 oz (77.3 kg)  BMI 25.92 kg/m2  SpO2 100%  LMP  (LMP Unknown)  HEMODYNAMICS:    VENTILATOR SETTINGS: Vent Mode:  [-] PRVC FiO2 (%):  [40 %] 40 % Set Rate:  [14 bmp] 14 bmp Vt Set:  [490 mL] 490 mL PEEP:  [5 cmH20] 5 cmH20 Plateau Pressure:  [16 cmH20-25 cmH20] 18 cmH20  INTAKE / OUTPUT: I/O last 3 completed shifts: In: 4153 [I.V.:1525; NG/GT:1428; IV Piggyback:1200] Out: 5600 [Urine:5600]  PHYSICAL EXAMINATION: General:  Sedated on vent. RASS- +1, moving right upper extremities to voice and obeying some commands Neuro: left sided weakness  HEENT:  Atraumatic, normocephalic, Pupils equal- ~26mm - left eye, could not open light Cardiovascular: regular. No MRG Lungs:  equal breath sounds, secretions in airway Abdomen:  Not Tender  no r/g,  Musculoskeletal:  Moving right upper to lower extremity nit not left  Skin: track marks all over the body, SCDs  present  *LABS:  BMET  Recent Labs Lab 10/13/15 0029 10/13/15 0436 10/13/15 1208  NA 139 141 141  K 2.9* 4.0 4.0  CL 103 104 104  CO2 BUN CREATININE 0.40* 0.54 0.47  GLUCOSE 103* 98 99    Electrolytes  Recent Labs Lab 10/08/15 1153 10/08/15 2131  10/09/15 0857  10/12/15 1459 10/13/15 0029 10/13/15 0436 10/13/15 1208  CALCIUM  --   --   < >  --   < > 8.2* 7.5* 8.1* 8.1*  MG 2.0 1.7  --  1.7  < > 2.1 1.8  --  2.0  PHOS 4.4 4.6  --  4.1  --   --   --   --   --   < > = values in this interval not displayed.  CBC  Recent Labs Lab 10/10/15 1542 10/12/15 0948 10/13/15 0641  WBC 12.8* 17.0* 13.7*  HGB 7.6* 8.2* 7.1*  HCT 23.7* 26.5* 22.5*  PLT 114* 207 207    Coag's  Recent Labs Lab 10/07/15 0726 10/11/15 1138 10/13/15 1209  APTT  --  31 34  INR 1.45 1.35 1.33    Sepsis Markers  Recent Labs Lab 10/06/15 1539 10/06/15 2248  LATICACIDVEN 2.6* 1.7    ABG  Recent Labs Lab 10/06/15 2110 10/07/15 1217  PHART 7.316* 7.364  PCO2ART 25.6* 29.3*  PO2ART 129.0* 82.0    Liver Enzymes  Recent Labs Lab 10/08/15 0559 10/09/15  0450 10/10/15 0525  AST 25 22 27   ALT 15 17 17   ALKPHOS 83 79 65  BILITOT 0.8 0.8 0.8  ALBUMIN 1.6* 1.5* 1.3*    Cardiac Enzymes No results for input(s): TROPONINI, PROBNP in the last 168 hours.  Glucose  Recent Labs Lab 10/12/15 1522 10/12/15 2002 10/12/15 2358 10/13/15 0351 10/13/15 0840 10/13/15 1140  GLUCAP 91 101* 88 73 100* 84    Imaging Dg Chest Port 1 View  10/13/2015  CLINICAL DATA:  Respiratory failure. EXAM: PORTABLE CHEST 1 VIEW COMPARISON:  CT 10/12/2015.  Chest x-ray 10/12/2015. FINDINGS: Endotracheal tube, NG tube, right PICC line in stable position. Cardiomegaly with normal pulmonary vascularity. Persistent prominent bilateral pulmonary infiltrates are again noted. These are noted be cavitary on prior CT. Persistent bilateral pleural effusions. No pneumothorax. IMPRESSION:  1. Lines and tubes in stable position. 2. Persistent bilateral diffuse pulmonary infiltrates are again noted. These infiltrates are noted be cavitary on prior CT. Bilateral pleural effusions also again noted . Electronically Signed   By: Maisie Fushomas  Register   On: 10/13/2015 07:27  remarkably worse on the left today   STUDIES:  4/5 Chest CT W Contrast- Cavitary lesions- largest- on left - 31mm and on right- 36mm, bilat pleural effusions. 4/5 Head Ct with Contrast- Evolving, large subacute right MCA territory infarct with unchanged mass effect. 2. Small occipital infarcts better demonstrated on MRI.  4/1 MRI brain w Wo contrast- Large acute right MCA infarct, without hemorrhagic conversion or propagation. Evolving small bilateral occipital lobe infarcts. MRA HEAD: Mildly motion degraded examination. Acute RIGHT distal M1 occlusion consistent with embolic phenomena. 3/29 US renal . No hydronephrosis. 2. Increased parenchymal echogenicity bilaterally compatible with chronic medical renal disease. 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 No significant right-to-left shift or hemorrhagic transformation.BILATERAL occipital lobe hypodensities, subcentimeter size, could represent brain abscesses (versus acute subcortical infarcts). Consider continued surveillance with post infusion MRI. 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 EF of 50-55%  CULTURES: 3/28 BC > mssa 3/28 CSF-normal 3/30>>>  Staph aureus-MSSA again 4/3  B/c>>>  4/4 tracheal Cultures >>>  ANTIBIOTICS: Naficillin 3/30>>> 4/6 Meropenem 4/6 >>  SIGNIFICANT EVENTS: 3/28 admitted 3/30 intubated. PCCM consult 4/4- Picc inserted, Central line removed. 4/6 Antibiotics changed from Nafcillin to Meropenem to broaden coverage to include Gram Neg and Anaerobes 4/6 Tracheostomy  LINES/TUBES: 3/30 ETT>>> 3/30 Central line  >>>out 4/4 Picc 4/4 >>  DISCUSSION: 35 year old female with h/o polysubstance abuse now presents with right sided septic emboli ddue to IVDU. Major issues are debilitation s/p CVA, agitation, NAG metabolic acidosis.This has improved. We will cont supportive care. Off precedex. Hope we may be able to extubate her soon. Still spiking fevers. Imaging ordered- Head and chest.  ASSESSMENT / PLAN:  PULMONARY A: Acute Hypoxemic respiratory failure Multifocal PNA due to septic emboli Worsening airspace disease on left  Pulm edema P:   Full vent support  Need to control withdrawal.  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  Needs diuresis- IV lasix  BID Q12H Bmet and MAg Goal K- >4, Mag >2.  GASTROINTESTINAL A:   ?mesentric ischemia possibly due to IVDU Anemia likley from sepsis P:   tolerating feeds Protonix for GIP Check stool for blood > blood in stool Hgb 7.1 4/6, transfuse  HEMATOLOGIC A:   Anemia Thrombocytopenia r/t sepsis   P:  CBC in am scds for DVT prophylaxis Heparin sq held Observe Hb and Hct.  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  Encephalopathy  Heroine withdrawal  P:   RASS goal: -1 - 0 Cont fentanyl and versed drip for now, hopefully wean after trach Appreciate Pharmacy help with AD/anxiolytics. Cont seroquel today on top of Klonazepam Monitor QTc. Neurology Signed off 4/1 Monitor for signs of raised Intracranial pressure ?need for seizure ppx  FAMILY  - Updates: mother and father updated.    - Inter-disciplinary family meet or Palliative Care meeting due by: 10/13/15  Critical care time spent on this pt : 35 minutes.   Inocente Salles, MD [facility], PGY-3 [phone]

## 2015-10-13 NOTE — Progress Notes (Signed)
Nutrition Follow-up  DOCUMENTATION CODES:   Underweight  INTERVENTION:    Continue Vital AF 1.2, increase goal rate to 60 ml/h (1440 ml per day) to provide 1728 kcals, 108 gm protein, 1168 ml free water daily.  D/C Prostat.  NUTRITION DIAGNOSIS:   Inadequate oral intake related to inability to eat as evidenced by NPO status.  Ongoing  GOAL:   Patient will meet greater than or equal to 90% of their needs  Met  MONITOR:   TF tolerance, Labs, Vent status, Weight trends  ASSESSMENT:   35 y.o. Female with h/o seizures, polysubstance abuse (IV heroin and cocaine) and tobacco abuse who presented to ED with L side weakness. Mother endorsed pt has had psychosis in past. 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 Additional foci of acute nonhemorrhagic infarct involves R occipital lobe, L occipital parietal lobe, in L cerebellum. CXR 3/28 patchy bilateral airspace opacities raise concern for multifocal pneumonia  Discussed patient in ICU rounds and with RN today. Plans for trach placement today. Weight is trending up with positive fluid status. Patient is currently receiving Vital AF 1.2 via OGT at 40 ml/h (960 ml/day) with Prostat 30 ml daily to provide 1252 kcals, 87 gm protein, 779 ml free water daily. Propofol is off.  Patient is currently intubated on ventilator support MV: 11.4 L/min Temp (24hrs), Avg:100 F (37.8 C), Min:97.7 F (36.5 C), Max:102.7 F (39.3 C)   Diet Order:  Diet NPO time specified Diet NPO time specified  Skin:  Wound (see comment) (DTI to R heel)  Last BM:  4/6  Height:   Ht Readings from Last 1 Encounters:  10/04/15 5' 8"  (1.727 m)    Weight:   Wt Readings from Last 1 Encounters:  10/13/15 170 lb 6.7 oz (77.3 kg)   10/07/15 116 lb 2.9 oz (52.7 kg)       10/04/15 113 lb 9.6 oz (51.529 kg)         Ideal Body Weight:  63.6 kg  BMI:  17.28 on admission  Estimated Nutritional Needs:   Kcal:   1705  Protein:  80-100 gm  Fluid:  1.7-2 L  EDUCATION NEEDS:   No education needs identified at this time  Molli Barrows, [place], [place], [place] Pager [phone] After Hours Pager [phone]

## 2015-10-13 NOTE — Progress Notes (Signed)
Dr. Christene Slatese Dios continued order of right wrist restraint due to patient attempting in the morning to grab ETT. Will continue to assess for need for restraints throughout shift.  Corliss SkainsJuan Lanai Conlee RN

## 2015-10-14 ENCOUNTER — Inpatient Hospital Stay (HOSPITAL_COMMUNITY): Payer: Medicaid Other

## 2015-10-14 DIAGNOSIS — Z930 Tracheostomy status: Secondary | ICD-10-CM

## 2015-10-14 DIAGNOSIS — Z430 Encounter for attention to tracheostomy: Secondary | ICD-10-CM | POA: Insufficient documentation

## 2015-10-14 DIAGNOSIS — Z4659 Encounter for fitting and adjustment of other gastrointestinal appliance and device: Secondary | ICD-10-CM | POA: Insufficient documentation

## 2015-10-14 LAB — GLUCOSE, CAPILLARY
GLUCOSE-CAPILLARY: 113 mg/dL — AB (ref 65–99)
GLUCOSE-CAPILLARY: 76 mg/dL (ref 65–99)
GLUCOSE-CAPILLARY: 79 mg/dL (ref 65–99)
GLUCOSE-CAPILLARY: 99 mg/dL (ref 65–99)
Glucose-Capillary: 62 mg/dL — ABNORMAL LOW (ref 65–99)
Glucose-Capillary: 55 mg/dL — ABNORMAL LOW (ref 65–99)
Glucose-Capillary: 65 mg/dL (ref 65–99)
Glucose-Capillary: 93 mg/dL (ref 65–99)

## 2015-10-14 LAB — TYPE AND SCREEN
ABO/RH(D): O POS
Antibody Screen: NEGATIVE
Unit division: 0
Unit division: 0

## 2015-10-14 LAB — BASIC METABOLIC PANEL
ANION GAP: 10 (ref 5–15)
ANION GAP: 6 (ref 5–15)
Anion gap: 9 (ref 5–15)
BUN: 10 mg/dL (ref 6–20)
BUN: 10 mg/dL (ref 6–20)
BUN: 9 mg/dL (ref 6–20)
CALCIUM: 7.5 mg/dL — AB (ref 8.9–10.3)
CALCIUM: 7.9 mg/dL — AB (ref 8.9–10.3)
CALCIUM: 8.3 mg/dL — AB (ref 8.9–10.3)
CHLORIDE: 105 mmol/L (ref 101–111)
CHLORIDE: 107 mmol/L (ref 101–111)
CO2: 26 mmol/L (ref 22–32)
CO2: 28 mmol/L (ref 22–32)
CO2: 28 mmol/L (ref 22–32)
CREATININE: 0.51 mg/dL (ref 0.44–1.00)
CREATININE: 0.51 mg/dL (ref 0.44–1.00)
CREATININE: 0.54 mg/dL (ref 0.44–1.00)
Chloride: 103 mmol/L (ref 101–111)
GFR calc non Af Amer: >60 mL/min (ref >60)
GFR calc non Af Amer: >60 mL/min (ref >60)
GLUCOSE: 72 mg/dL (ref 65–99)
Glucose, Bld: 100 mg/dL — ABNORMAL HIGH (ref 65–99)
Glucose, Bld: 85 mg/dL (ref 65–99)
Potassium: 3.2 mmol/L — ABNORMAL LOW (ref 3.5–5.1)
Potassium: 3.2 mmol/L — ABNORMAL LOW (ref 3.5–5.1)
Potassium: 3.9 mmol/L (ref 3.5–5.1)
SODIUM: 137 mmol/L (ref 135–145)
SODIUM: 143 mmol/L (ref 135–145)
Sodium: 142 mmol/L (ref 135–145)

## 2015-10-14 LAB — CBC
HEMATOCRIT: 23.8 % — AB (ref 36.0–46.0)
Hemoglobin: 7.5 g/dL — ABNORMAL LOW (ref 12.0–15.0)
MCH: 26.8 pg (ref 26.0–34.0)
MCHC: 31.5 g/dL (ref 30.0–36.0)
MCV: 85 fL (ref 78.0–100.0)
PLATELETS: 240 10*3/uL (ref 150–400)
RBC: 2.8 MIL/uL — AB (ref 3.87–5.11)
RDW: 15.8 % — ABNORMAL HIGH (ref 11.5–15.5)
WBC: 14.2 10*3/uL — AB (ref 4.0–10.5)

## 2015-10-14 LAB — MAGNESIUM
MAGNESIUM: 1.6 mg/dL — AB (ref 1.7–2.4)
MAGNESIUM: 1.9 mg/dL (ref 1.7–2.4)

## 2015-10-14 MED ORDER — METOPROLOL TARTRATE 1 MG/ML IV SOLN
5.0000 mg | Freq: Once | INTRAVENOUS | Status: AC
  Administered 2015-10-14: 5 mg via INTRAVENOUS
  Filled 2015-10-14: qty 5

## 2015-10-14 MED ORDER — POTASSIUM CHLORIDE 20 MEQ PO PACK
40.0000 meq | PACK | Freq: Once | ORAL | Status: DC
  Filled 2015-10-14: qty 2

## 2015-10-14 MED ORDER — DEXTROSE 50 % IV SOLN
INTRAVENOUS | Status: AC
Start: 2015-10-14 — End: 2015-10-14
  Filled 2015-10-14: qty 50

## 2015-10-14 MED ORDER — DEXTROSE 50 % IV SOLN
25.0000 mL | Freq: Once | INTRAVENOUS | Status: AC
  Administered 2015-10-14: 25 mL via INTRAVENOUS

## 2015-10-14 MED ORDER — MAGNESIUM SULFATE 2 GM/50ML IV SOLN
2.0000 g | Freq: Once | INTRAVENOUS | Status: AC
  Administered 2015-10-14: 2 g via INTRAVENOUS
  Filled 2015-10-14: qty 50

## 2015-10-14 MED ORDER — DEXTROSE 50 % IV SOLN
INTRAVENOUS | Status: AC
  Administered 2015-10-14: 25 mL
  Filled 2015-10-14: qty 50

## 2015-10-14 MED ORDER — POTASSIUM CHLORIDE 20 MEQ/15ML (10%) PO SOLN
40.0000 meq | Freq: Once | ORAL | Status: AC
  Administered 2015-10-14: 40 meq
  Filled 2015-10-14: qty 30

## 2015-10-14 NOTE — Progress Notes (Signed)
eLink Physician-Brief Progress Note Patient Name: [name] DOB: 09/12/1980 MRN: [id number]   Date of Service  10/14/2015  HPI/Events of Note  Hypoglycemia - Blood glucose = 65.   eICU Interventions  Please give 1/2 amp D50 per already ordered protocol.      Intervention Category Major Interventions: Acute renal failure - evaluation and management;Other:  Lenell AntuSommer,Steven Eugene 10/14/2015, 4:14 PM

## 2015-10-14 NOTE — Progress Notes (Signed)
PULMONARY / CRITICAL CARE MEDICINE   Name: Cassandra Wall MRN: [id number] DOB: 1981/03/12    ADMISSION DATE:  10/04/2015 CONSULTATION DATE:  10/06/15  REFERRING MD:  Patel,P  CHIEF COMPLAINT: Hypertensive urgency related to opioid withdrawl  HISTORY OF PRESENT ILLNESS:  Cassandra Wall is a 35 year old female with past medical history significant for polysubstance abuse(iv heroine and cocaine), tobacco abuse who presented to the ED with complaints of left sided weakness which was going on for two days prior to the admission.  CT of the brain was concerning for right sided infarct She was evaluated by neurology and they think it is septic emboli from IVDU. She has track marks all over her body. From her note it appears that her brother passed away with endocarditis due to IVDU. Rt side endocarditis. Also with septic pulmonary emboli and mesenteric ischemia.  SUBJECTIVE:  Drowsy.   VITAL SIGNS: BP 119/60 mmHg  Pulse 135  Temp(Src) 99.7 F (37.6 C) (Rectal)  Resp 23  Ht  (1.727 m)  Wt 155 lb 13.8 oz (70.7 kg)  BMI 23.70 kg/m2  SpO2 99%  LMP  (LMP Unknown)  HEMODYNAMICS:    VENTILATOR SETTINGS: Vent Mode:  [-] CPAP;PSV FiO2 (%):  [40 %-100 %] 40 % Set Rate:  [14 bmp-20 bmp] 14 bmp Vt Set:  [490 mL] 490 mL PEEP:  [5 cmH20] 5 cmH20 Pressure Support:  [10 cmH20] 10 cmH20 Plateau Pressure:  [19 cmH20-22 cmH20] 21 cmH20  INTAKE / OUTPUT: I/O last 3 completed shifts: In: 2904.5 [I.V.:999.5; Blood:285; NG/GT:720; IV Piggyback:900] Out: 7250 [Urine:7250]  PHYSICAL EXAMINATION: General:  Drowsy, RASS-  -1, not following commands Neuro: left sided weakness HEENT:  Atraumatic, normocephalic Cardiovascular: regular. No MRG Lungs: equal breath sounds, secretions in airway Abdomen:  Not Tender  no r/g,  Musculoskeletal:  Edema. Skin: track marks all over the body, SCDs present  LABS:  BMET  Recent Labs Lab 10/13/15 0436 10/13/15 1208 10/13/15 2349  NA 141  141 137  K 4.0 4.0 3.9  CL 104 104 103  CO2 BUN CREATININE 0.54 0.47 0.51  GLUCOSE 98 99 85    Electrolytes  Recent Labs Lab 10/08/15 1153 10/08/15 2131  10/09/15 0857  10/13/15 0436 10/13/15 1208 10/13/15 1902 10/13/15 2349  CALCIUM  --   --   < >  --   < > 8.1* 8.1*  --  8.3*  MG 2.0 1.7  --  1.7  < >  --  2.0 2.2 1.9  PHOS 4.4 4.6  --  4.1  --   --   --   --   --   < > = values in this interval not displayed.  CBC  Recent Labs Lab 10/13/15 2035 10/13/15 2349 10/14/15 0530  WBC 14.8* 16.0* 14.2*  HGB 7.7* 8.0* 7.5*  HCT 25.5* 26.2* 23.8*  PLT 235 261 240    Coag's  Recent Labs Lab 10/11/15 1138 10/13/15 1209  APTT 31 34  INR 1.35 1.33    Sepsis Markers No results for input(s): LATICACIDVEN, PROCALCITON, O2SATVEN in the last 168 hours.  ABG  Recent Labs Lab 10/07/15 1217  PHART 7.364  PCO2ART 29.3*  PO2ART 82.0    Liver Enzymes  Recent Labs Lab 10/08/15 0559 10/09/15 0450 10/10/15 0525  AST ALT ALKPHOS 83 79 65  BILITOT 0.8 0.8 0.8  ALBUMIN 1.6* 1.5* 1.3*  Cardiac Enzymes No results for input(s): TROPONINI, PROBNP in the last 168 hours.  Glucose  Recent Labs Lab 10/13/15 1525 10/13/15 1940 10/13/15 2342 10/14/15 0338 10/14/15 0820 10/14/15 1048  GLUCAP 78 78 76 79 62* 55*    Imaging Dg Chest Port 1 View  10/14/2015  CLINICAL DATA:  Respiratory failure. EXAM: PORTABLE CHEST 1 VIEW COMPARISON:  10/13/2015. FINDINGS: Tracheostomy tube and right PICC line in stable position. Cardiomegaly. Diffuse bilateral pulmonary infiltrates again noted. Bilateral pleural effusions again noted. No interim change. No pneumothorax. IMPRESSION: 1. Lines and tubes in stable position. 2. Persistent bilateral pulmonary infiltrates and basilar atelectasis. Small persistent bilateral pleural effusions. No interim change. Electronically Signed   By: Maisie Fus  Register   On: 10/14/2015 07:13   Dg Chest Port 1  View  10/13/2015  CLINICAL DATA:  Tracheostomy placement. EXAM: PORTABLE CHEST 1 VIEW COMPARISON:  Same day. FINDINGS: Stable cardiomegaly. Endotracheal and nasogastric tubes have been removed. Interval placement of tracheostomy tube which is in grossly good position. Stable right-sided PICC line is noted with distal tip in expected position of the SVC. No pneumothorax is noted. Moderate left pleural effusion is noted and increased compared to prior exam, with probable underlying atelectasis or infiltrate. Stable right basilar opacity is noted concerning for edema or atelectasis with mild associated pleural effusion. Bony thorax is unremarkable. IMPRESSION: Tracheostomy tube is in grossly good position. Moderate left pleural effusion is noted which is increased compared to prior exam, with probable underlying atelectasis or infiltrate. Stable right basilar opacity is noted concerning for edema or atelectasis with mild associated pleural effusion. Electronically Signed   By: Lupita Raider, M.D.   On: 10/13/2015 15:49   STUDIES:  4/5 Chest CT W Contrast- Cavitary lesions- largest- on left - 31mm and on right- 36mm, bilat pleural effusions. 4/5 Head Ct with Contrast- Evolving, large subacute right MCA territory infarct with unchanged mass effect. 2. Small occipital infarcts better demonstrated on MRI.  4/1 MRI brain w Wo contrast- Large acute right MCA infarct, without hemorrhagic conversion or propagation. Evolving small bilateral occipital lobe infarcts. MRA HEAD: Mildly motion degraded examination. Acute RIGHT distal M1 occlusion consistent with embolic phenomena. 3/29 US renal . No hydronephrosis. 2. Increased parenchymal echogenicity bilaterally compatible with chronic medical renal disease. 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 No significant right-to-left shift or hemorrhagic transformation.BILATERAL occipital lobe  hypodensities, subcentimeter size, could represent brain abscesses (versus acute subcortical infarcts). Consider continued surveillance with post infusion MRI. 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 EF of 50-55%  CULTURES: 3/28 BC > Staph aureus-MSSA 3/28 CSF- No growth Final 3/30>>>  Staph aureus-MSSA 4/3  B/c>>> no growth 3 days 4/4 tracheal Cultures >>> Moraxella Catarrhalis- Final.  ANTIBIOTICS: Naficillin 3/30>>> 4/6 Meropenem 4/6 >>  SIGNIFICANT EVENTS: 3/28 admitted 3/30 intubated. PCCM consult 4/4- Picc inserted, Central line removed. 4/6 Antibiotics changed from Nafcillin to Meropenem to broaden coverage to include Gram Neg and Anaerobes 4/6 Tracheostomy  LINES/TUBES: 3/30 ETT>>> 3/30 Central line >>>out 4/4 Picc 4/4 >>  DISCUSSION: 35 year old female with h/o polysubstance abuse now presents with right sided septic emboli ddue to IVDU. Major issues are debilitation s/p CVA, agitation, NAG metabolic acidosis.This has improved. We will cont supportive care. Off precedex. Hope we may be able to extubate her soon. Still spiking fevers. Imaging ordered- Head and chest.  ASSESSMENT / PLAN:  PULMONARY A: Acute Hypoxemic  respiratory failure Multifocal PNA due to septic emboli Worsening airspace disease on left  Pulm edema Tracheostomy P:   Full vent support  Need to control withdrawal.  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  diuresis- IV lasix 40mg  BID Q12H Bmet and MAg Goal K- >4, Mag >2. Replete electrolytes  GASTROINTESTINAL A:   ?mesentric  ischemia possibly due to IVDU Anemia likely from sepsis P:   tolerating feeds- insert coltrak tube Protonix Hgb 7.5 ( transfused 1unit yesterday)  HEMATOLOGIC A:   Anemia 2/2 to sepsis, ?mild GI bleed Thrombocytopenia r/t sepsis - Improved  P:  CBC in am scds for DVT prophylaxis Heparin sq held Observe Hb and Hct.  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  Encephalopathy  Heroine withdrawal  P:   RASS goal: -1 - 0 Appreciate Pharmacy help with AD/anxiolytics. Cont seroquel today on top of Klonazepam Some agitation with reducing versed to 5, plan to wean off sedatives over the next 24hrs, Reduce fentanyl to 200 Monitor QTc. Neurology Signed off 4/1 Monitor for signs of raised Intracranial pressure ?need for seizure ppx  Updates- now has Tracheostomy. Plan to wean of versed and fentanyl.  FAMILY  - Updates: mother and father updated.    - Inter-disciplinary family meet or Palliative Care meeting due by:   Critical care time spent on this pt : 35 minutes.   Inocente SallesEjiro Emokpae, MD [facility], PGY-3 [phone]

## 2015-10-14 NOTE — Progress Notes (Signed)
Patient removed Cortrak tube.  Restraint reapplied to right wrist.  Increased medication to improve RASS score.  Dr. Earlene PlaterWallace made aware.

## 2015-10-14 NOTE — Progress Notes (Addendum)
Subjective: With tracheostomy and on ventilator   Antibiotics:  Anti-infectives    Start     Dose/Rate Route Frequency Ordered Stop   10/13/15 1400  meropenem (MERREM) 2 g in sodium chloride 0.9 % 100 mL IVPB     2 g 200 mL/hr over 30 Minutes Intravenous 3 times per day 10/13/15 0946     10/06/15 1200  ceFAZolin (ANCEF) IVPB 2g/100 mL premix  Status:  Discontinued    Comments:  Pharmacy may adjust   2 g 200 mL/hr over 30 Minutes Intravenous 3 times per day 10/06/15 1019 10/06/15 1019   10/06/15 1200  nafcillin 2 g in dextrose 5 % 100 mL IVPB  Status:  Discontinued     2 g 200 mL/hr over 30 Minutes Intravenous 6 times per day 10/06/15 1042 10/13/15 0946   10/06/15 1100  nafcillin injection 2 g  Status:  Discontinued    Comments:  Pharmacy may adjust   2 g Intravenous Every 4 hours 10/06/15 1019 10/06/15 1042   10/05/15 1100  ceFAZolin (ANCEF) IVPB 2g/100 mL premix  Status:  Discontinued    Comments:  Pharmacy may adjust   2 g 200 mL/hr over 30 Minutes Intravenous Every 12 hours 10/05/15 0959 10/06/15 1019   10/05/15 0500  ceFEPIme (MAXIPIME) 2 g in dextrose 5 % 50 mL IVPB  Status:  Discontinued     2 g 100 mL/hr over 30 Minutes Intravenous Every 24 hours 10/04/15 1444 10/05/15 0959   10/04/15 1215  ampicillin (OMNIPEN) 2 g in sodium chloride 0.9 % 50 mL IVPB     2 g 150 mL/hr over 20 Minutes Intravenous NOW 10/04/15 1208 10/04/15 1305   10/04/15 0530  ceFEPIme (MAXIPIME) 2 g in dextrose 5 % 50 mL IVPB     2 g 100 mL/hr over 30 Minutes Intravenous  Once 10/04/15 0529 10/04/15 0612   10/04/15 0445  cefTRIAXone (ROCEPHIN) 2 g in dextrose 5 % 50 mL IVPB     2 g 100 mL/hr over 30 Minutes Intravenous  Once 10/04/15 0441 10/04/15 0529   10/04/15 0445  vancomycin (VANCOCIN) 2,000 mg in sodium chloride 0.9 % 500 mL IVPB     2,000 mg 250 mL/hr over 120 Minutes Intravenous  Once 10/04/15 0441 10/04/15 1234   10/04/15 0445  ampicillin (OMNIPEN) 2 g in sodium chloride 0.9 % 50  mL IVPB  Status:  Discontinued     2 g 150 mL/hr over 20 Minutes Intravenous  Once 10/04/15 0441 10/04/15 0532      Medications: Scheduled Meds: . sodium chloride   Intravenous Once  . sodium chloride   Intravenous Once  . sodium chloride   Intravenous Once  . sodium chloride   Intravenous Once  . antiseptic oral rinse  7 mL Mouth Rinse QID  . chlorhexidine gluconate (SAGE KIT)  15 mL Mouth Rinse BID  . clonazePAM  1 mg Per Tube TID  . etomidate  40 mg Intravenous Once  . fentaNYL (SUBLIMAZE) injection  200 mcg Intravenous Once  . furosemide  40 mg Intravenous BID  . meropenem (MERREM) IV  2 g Intravenous 3 times per day  . midazolam  4 mg Intravenous Once  . pantoprazole sodium  40 mg Per Tube Q24H  . QUEtiapine  50 mg Oral BID  . sodium chloride flush  10-40 mL Intracatheter Q12H   Continuous Infusions: . sodium chloride Stopped (10/08/15 0000)  . dexmedetomidine Stopped (10/09/15 2156)  .  feeding supplement (VITAL AF 1.2 CAL) 1,000 mL (10/14/15 1659)  . fentaNYL infusion INTRAVENOUS 300 mcg/hr (10/14/15 1525)  . midazolam (VERSED) infusion Stopped (10/14/15 1657)   PRN Meds:.acetaminophen (TYLENOL) oral liquid 160 mg/5 mL, docusate, fentaNYL (SUBLIMAZE) injection, [DISCONTINUED] ondansetron **OR** ondansetron (ZOFRAN) IV, sodium chloride flush    Objective: Weight change: -14 lb 8.8 oz (-6.6 kg)  Intake/Output Summary (Last 24 hours) at 10/14/15 1703 Last data filed at 10/14/15 1400  Gross per 24 hour  Intake 761.97 ml  Output   3445 ml  Net -2683.03 ml   Blood pressure 118/66, pulse 125, temperature 100.7 F (38.2 C), temperature source Rectal, resp. rate 30, height _0  (1.727 m), weight 155 lb 13.8 oz (70.7 kg), SpO2 100 %. Temp:  [99.7 F (37.6 C)-102 F (38.9 C)] 100.7 F (38.2 C) (04/07 1553) Pulse Rate:  [102-141] 125 (04/07 1543) Resp:  [15-30] 30 (04/07 1543) BP: (113-154)/(59-101) 118/66 mmHg (04/07 1400) SpO2:  [93 %-100 %] 100 % (04/07 1543) FiO2  (%):  [40 %] 40 % (04/07 1543) Weight:  [155 lb 13.8 oz (70.7 kg)] 155 lb 13.8 oz (70.7 kg) (04/07 0500)  Physical Exam: General:ith tracheostomy, did not follow commands for me but had for staff earlier HEENT: anicteric sclera,  EOMI CVS regular rate, could not hear a murmur Chest:rhonchi throughout Abdomen: soft nontender, nondistended, normal bowel sounds, Extremities: no  clubbing or edema noted bilaterally Skin: Numerous injection sites throughout her body, PICC line in place , IJ out Neuro: nonfocal  CBC:  CBC Latest Ref Rng 10/14/2015 10/13/2015 10/13/2015  WBC 4.0 - 10.5 K/uL 14.2(H) 16.0(H) 14.8(H)  Hemoglobin 12.0 - 15.0 g/dL 7.5(L) 8.0(L) 7.7(L)  Hematocrit 36.0 - 46.0 % 23.8(L) 26.2(L) 25.5(L)  Platelets 150 - 400 K/uL 240 261 235       BMET  Recent Labs  10/14/15 1133 10/14/15 1515  NA 143 142  K 3.2* 3.2*  CL 105 107  CO2 28 26  GLUCOSE 100* 72  BUN 10 9  CREATININE 0.54 0.51  CALCIUM 7.9* 7.5*     Liver Panel  No results for input(s): PROT, ALBUMIN, AST, ALT, ALKPHOS, BILITOT, BILIDIR, IBILI in the last 72 hours.     Sedimentation Rate No results for input(s): ESRSEDRATE in the last 72 hours. C-Reactive Protein No results for input(s): CRP in the last 72 hours.  Micro Results: Recent Results (from the past 720 hour(s))  Culture, blood (single)     Status: None   Collection Time: 10/04/15  4:46 AM  Result Value Ref Range Status   Specimen Description BLOOD LEFT HIP  Final   Special Requests IN PEDIATRIC BOTTLE 5CC  Final   Culture  Setup Time   Final    GRAM POSITIVE COCCI IN CLUSTERS AEROBIC BOTTLE ONLY CRITICAL RESULT CALLED TO, READ BACK BY AND VERIFIED WITHOneta Rack RN 1706 10/04/15 A BROWNING    Culture   Final    STAPHYLOCOCCUS AUREUS Performed at [facility]    Report Status 10/06/2015 FINAL  Final   Organism ID, Bacteria STAPHYLOCOCCUS AUREUS  Final      Susceptibility   Staphylococcus aureus - MIC*    CIPROFLOXACIN  <=0.5 SENSITIVE Sensitive     ERYTHROMYCIN <=0.25 SENSITIVE Sensitive     GENTAMICIN <=0.5 SENSITIVE Sensitive     OXACILLIN <=0.25 SENSITIVE Sensitive     TETRACYCLINE <=1 SENSITIVE Sensitive     VANCOMYCIN 1 SENSITIVE Sensitive     TRIMETH/SULFA <=10 SENSITIVE Sensitive  CLINDAMYCIN <=0.25 SENSITIVE Sensitive     RIFAMPIN <=0.5 SENSITIVE Sensitive     Inducible Clindamycin NEGATIVE Sensitive     * STAPHYLOCOCCUS AUREUS  CSF culture     Status: None   Collection Time: 10/04/15  6:00 AM  Result Value Ref Range Status   Specimen Description CSF  Final   Special Requests Normal  Final   Gram Stain   Final    CYTOSPIN WBC PRESENT,BOTH PMN AND MONONUCLEAR NO ORGANISMS SEEN Gram Stain Report Called to,Read Back By and Verified With: A. DENNIS RN AT 0700 ON 03.28.17 BY SHUEA    Culture   Final    NO GROWTH 3 DAYS Performed at [facility]    Report Status 10/07/2015 FINAL  Final  Anaerobic culture     Status: None   Collection Time: 10/04/15  6:00 AM  Result Value Ref Range Status   Specimen Description CSF  Final   Special Requests NONE  Final   Gram Stain   Final    WBC PRESENT,BOTH PMN AND MONONUCLEAR NO ORGANISMS SEEN CYTOSPIN SMEAR    Culture   Final    NO ANAEROBES ISOLATED Performed at [facility]    Report Status 10/09/2015 FINAL  Final  MRSA PCR Screening     Status: None   Collection Time: 10/04/15 10:52 PM  Result Value Ref Range Status   MRSA by PCR NEGATIVE NEGATIVE Final    Comment:        The GeneXpert MRSA Assay (FDA approved for NASAL specimens only), is one component of a comprehensive MRSA colonization surveillance program. It is not intended to diagnose MRSA infection nor to guide or monitor treatment for MRSA infections.   Culture, blood (routine x 2)     Status: None   Collection Time: 10/06/15  8:34 AM  Result Value Ref Range Status   Specimen Description BLOOD RIGHT ANTECUBITAL  Final   Special Requests IN PEDIATRIC  BOTTLE 1CC  Final   Culture  Setup Time   Final    GRAM POSITIVE COCCI IN CLUSTERS AEROBIC BOTTLE ONLY CRITICAL RESULT CALLED TO, READ BACK BY AND VERIFIED WITH: S WHITE,RN _0  10/07/15 MKELLY    Culture   Final    STAPHYLOCOCCUS AUREUS SUSCEPTIBILITIES PERFORMED ON PREVIOUS CULTURE WITHIN THE LAST 5 DAYS.    Report Status 10/08/2015 FINAL  Final  Culture, blood (routine x 2)     Status: None   Collection Time: 10/06/15  8:37 AM  Result Value Ref Range Status   Specimen Description BLOOD LEFT HAND  Final   Special Requests IN PEDIATRIC BOTTLE 2CC  Final   Culture  Setup Time   Final    GRAM POSITIVE COCCI IN CLUSTERS IN PEDIATRIC BOTTLE CRITICAL RESULT CALLED TO, READ BACK BY AND VERIFIED WITH: L WILSON,RN AT 9450 10/07/15 BY L BENFIELD    Culture   Final    STAPHYLOCOCCUS AUREUS SUSCEPTIBILITIES PERFORMED ON PREVIOUS CULTURE WITHIN THE LAST 5 DAYS.    Report Status 10/08/2015 FINAL  Final  Culture, respiratory (NON-Expectorated)     Status: None   Collection Time: 10/08/15  1:17 PM  Result Value Ref Range Status   Specimen Description TRACHEAL ASPIRATE  Final   Special Requests NONE  Final   Gram Stain   Final    ABUNDANT WBC PRESENT, PREDOMINANTLY PMN FEW SQUAMOUS EPITHELIAL CELLS PRESENT ABUNDANT YEAST FEW GRAM POSITIVE COCCI IN PAIRS IN CLUSTERS IN CHAINS THIS SPECIMEN IS ACCEPTABLE FOR SPUTUM CULTURE Performed at  [facility]    Culture   Final    MODERATE CANDIDA ALBICANS Performed at [facility]    Report Status 10/11/2015 FINAL  Final  Culture, blood (Routine X 2) w Reflex to ID Panel     Status: None (Preliminary result)   Collection Time: 10/10/15  4:04 PM  Result Value Ref Range Status   Specimen Description BLOOD RIGHT FOOT  Final   Special Requests IN PEDIATRIC BOTTLE 0.75CC  Final   Culture NO GROWTH 4 DAYS  Final   Report Status PENDING  Incomplete  Culture, blood (Routine X 2) w Reflex to ID Panel     Status: None (Preliminary  result)   Collection Time: 10/10/15  4:10 PM  Result Value Ref Range Status   Specimen Description BLOOD LEFT FOOT  Final   Special Requests IN PEDIATRIC BOTTLE 0.75CC  Final   Culture NO GROWTH 4 DAYS  Final   Report Status PENDING  Incomplete  Culture, respiratory (NON-Expectorated)     Status: None   Collection Time: 10/11/15 11:30 AM  Result Value Ref Range Status   Specimen Description TRACHEAL ASPIRATE  Final   Special Requests NONE  Final   Gram Stain   Final    FEW WBC PRESENT, PREDOMINANTLY PMN RARE SQUAMOUS EPITHELIAL CELLS PRESENT FEW GRAM POSITIVE COCCI IN PAIRS RARE GRAM NEGATIVE RODS RARE GRAM NEGATIVE COCCI Performed at [facility]    Culture   Final    MORAXELLA CATARRHALIS(BRANHAMELLA) Note: BETA LACTAMASE NEGATIVE Performed at [facility]    Report Status 10/13/2015 FINAL  Final    Studies/Results: Dg Chest Port 1 View  10/14/2015  CLINICAL DATA:  Respiratory failure. EXAM: PORTABLE CHEST 1 VIEW COMPARISON:  10/13/2015. FINDINGS: Tracheostomy tube and right PICC line in stable position. Cardiomegaly. Diffuse bilateral pulmonary infiltrates again noted. Bilateral pleural effusions again noted. No interim change. No pneumothorax. IMPRESSION: 1. Lines and tubes in stable position. 2. Persistent bilateral pulmonary infiltrates and basilar atelectasis. Small persistent bilateral pleural effusions. No interim change. Electronically Signed   By: Marcello Moores  Register   On: 10/14/2015 07:13   Dg Chest Port 1 View  10/13/2015  CLINICAL DATA:  Tracheostomy placement. EXAM: PORTABLE CHEST 1 VIEW COMPARISON:  Same day. FINDINGS: Stable cardiomegaly. Endotracheal and nasogastric tubes have been removed. Interval placement of tracheostomy tube which is in grossly good position. Stable right-sided PICC line is noted with distal tip in expected position of the SVC. No pneumothorax is noted. Moderate left pleural effusion is noted and increased compared to prior exam,  with probable underlying atelectasis or infiltrate. Stable right basilar opacity is noted concerning for edema or atelectasis with mild associated pleural effusion. Bony thorax is unremarkable. IMPRESSION: Tracheostomy tube is in grossly good position. Moderate left pleural effusion is noted which is increased compared to prior exam, with probable underlying atelectasis or infiltrate. Stable right basilar opacity is noted concerning for edema or atelectasis with mild associated pleural effusion. Electronically Signed   By: Marijo Conception, M.D.   On: 10/13/2015 15:49   Dg Chest Port 1 View  10/13/2015  CLINICAL DATA:  Respiratory failure. EXAM: PORTABLE CHEST 1 VIEW COMPARISON:  CT 10/12/2015.  Chest x-ray 10/12/2015. FINDINGS: Endotracheal tube, NG tube, right PICC line in stable position. Cardiomegaly with normal pulmonary vascularity. Persistent prominent bilateral pulmonary infiltrates are again noted. These are noted be cavitary on prior CT. Persistent bilateral pleural effusions. No pneumothorax. IMPRESSION: 1. Lines and tubes in stable position. 2. Persistent  bilateral diffuse pulmonary infiltrates are again noted. These infiltrates are noted be cavitary on prior CT. Bilateral pleural effusions also again noted . Electronically Signed   By: Marcello Moores  Register   On: 10/13/2015 07:27   Dg Abd Portable 1v  10/14/2015  CLINICAL DATA:  Feeding tube placement EXAM: PORTABLE ABDOMEN - 1 VIEW COMPARISON:  October 06, 2015 FINDINGS: Feeding tube extends into the stomach Corey loops upon itself. The tip of the feeding tube is in the proximal stomach. Bowel gas pattern is normal. There is airspace consolidation in the left mid and lower lung zones. IMPRESSION: Feeding tube tip in stomach. Bowel gas pattern unremarkable. Areas of left mid and lower lung zone airspace consolidation. Electronically Signed   By: Lowella Grip III M.D.   On: 10/14/2015 16:11      Assessment/Plan:  INTERVAL HISTORY:   10/10/15:  patient with persistent fevers 10/11/15: PICC placed and now IJ removed 4/5 --10/13/15: still febrile, CT shows bilateral cavitating septic emboli areas, bilateral pleural effusions, Tracheal aspirate is growing GNR-- Moraxella  CT head shows Evolving, large subacute right MCA territory infarct with unchanged mass effect.  10/14/15: still febrile  Principal Problem:   Sepsis ([place]) secondary to endocarditis in an IV drug abuser Active Problems:   IV drug abuse   Acute renal failure (ARF) (HCC)   Thrombocytopenia (HCC)   Septic embolism (HCC)   Acute embolic stroke (HCC)   Prolonged Q-T interval on ECG   Multifocal Pneumonia, due to septic emboli   Anemia   Stroke (cerebrum) (HCC)   Pressure ulcer   Acute and subacute infective endocarditis in diseases classified elsewhere   Acute respiratory failure with hypoxia (HCC)   Altered mental status   Endotracheally intubated   AKI (acute kidney injury) ([place])   Brain abscess   PNA (pneumonia)   Septic shock (HCC)   Staphylococcus aureus bacteremia with sepsis ([place])   Acute respiratory failure ([place])   Encounter for orogastric (OG) tube placement   PICC (peripherally inserted central catheter) in place   Brainstem infarct, acute ([place])   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  On the other hand it was  good news to her that she was able to follow commands yesterday and today  I don't think CT surgery would replace her valve at this point   #2 Worsening cavitation in lungs with pleural effusions infiltrates: there was  concern by primary team for Gram Negative infection and in the pleural space. Note her gram-negative rod turns out to be a MR axilla species that is not at beta-lactamase producer.   I am NOT that worried for anaerobic infection here  I am ok with giving 5 days of meropenem and then would go back to either high dose IV nafcillin vs IV ceftriaxone 2g IV q 12 hours  --I am ok with continuing the merrem for now given it does all that is requested by CCM in terms of coverage withotu QT prolongation issues and it would have good CNS penetration.  #4 Evolving  CNS infarct: this is not good news.   #3 IVDU: enormous problem. Hopefully she survives to be able to then come to terms with this. She has very high mortality if she cannot completely stop this habit  #4 Chronic HCV without hepatic coma: Can try to cure her when she is outside the hospital. Medicaid would insist that she is drug free  #5 Candida in sputum: of no consequence unless as maker of esophagitis  #6 QT prolongation: will need to be very careful with meds  #7 Persistent fever: fever will persist due to her enormous burden of infection, likely need for heart valve surgery, infarct in CNS and fact that we cannot give her a central line "holiday any time soon"  Dr. Linus Salmons is available for questions this weekend and Dr. Megan Salon will be covering the service Monday through Wednesday I'll be back next Thursday   LOS: 10 days   Alcide Evener 10/14/2015, 5:03 PM

## 2015-10-14 NOTE — Progress Notes (Signed)
Wasted 35 mL of versed with Madaline SavageJosh Draper, RN

## 2015-10-15 ENCOUNTER — Inpatient Hospital Stay (HOSPITAL_COMMUNITY): Payer: Medicaid Other

## 2015-10-15 LAB — BASIC METABOLIC PANEL
Anion gap: 10 (ref 5–15)
Anion gap: 11 (ref 5–15)
Anion gap: 11 (ref 5–15)
BUN: 10 mg/dL (ref 6–20)
BUN: 11 mg/dL (ref 6–20)
BUN: 9 mg/dL (ref 6–20)
CALCIUM: 8.4 mg/dL — AB (ref 8.9–10.3)
CHLORIDE: 103 mmol/L (ref 101–111)
CHLORIDE: 99 mmol/L — AB (ref 101–111)
CO2: 29 mmol/L (ref 22–32)
CO2: 30 mmol/L (ref 22–32)
CO2: 31 mmol/L (ref 22–32)
CREATININE: 0.31 mg/dL — AB (ref 0.44–1.00)
CREATININE: 0.48 mg/dL (ref 0.44–1.00)
CREATININE: 0.57 mg/dL (ref 0.44–1.00)
Calcium: 7 mg/dL — ABNORMAL LOW (ref 8.9–10.3)
Calcium: 8.2 mg/dL — ABNORMAL LOW (ref 8.9–10.3)
Chloride: 102 mmol/L (ref 101–111)
GFR calc Af Amer: >60 mL/min (ref >60)
GFR calc non Af Amer: >60 mL/min (ref >60)
GFR calc non Af Amer: >60 mL/min (ref >60)
GLUCOSE: 95 mg/dL (ref 65–99)
Glucose, Bld: 84 mg/dL (ref 65–99)
Glucose, Bld: 90 mg/dL (ref 65–99)
Potassium: 3.1 mmol/L — ABNORMAL LOW (ref 3.5–5.1)
Potassium: 3.5 mmol/L (ref 3.5–5.1)
Potassium: 4.4 mmol/L (ref 3.5–5.1)
SODIUM: 142 mmol/L (ref 135–145)
SODIUM: 139 mmol/L (ref 135–145)
Sodium: 145 mmol/L (ref 135–145)

## 2015-10-15 LAB — CBC
HEMATOCRIT: 19.9 % — AB (ref 36.0–46.0)
HEMATOCRIT: 28.3 % — AB (ref 36.0–46.0)
Hemoglobin: 6.2 g/dL — CL (ref 12.0–15.0)
Hemoglobin: 8.6 g/dL — ABNORMAL LOW (ref 12.0–15.0)
MCH: 26.6 pg (ref 26.0–34.0)
MCH: 26 pg (ref 26.0–34.0)
MCHC: 31.2 g/dL (ref 30.0–36.0)
MCHC: 30.4 g/dL (ref 30.0–36.0)
MCV: 85.4 fL (ref 78.0–100.0)
MCV: 85.5 fL (ref 78.0–100.0)
PLATELETS: 241 10*3/uL (ref 150–400)
PLATELETS: 271 10*3/uL (ref 150–400)
RBC: 2.33 MIL/uL — AB (ref 3.87–5.11)
RBC: 3.31 MIL/uL — ABNORMAL LOW (ref 3.87–5.11)
RDW: 15.8 % — AB (ref 11.5–15.5)
RDW: 15 % (ref 11.5–15.5)
WBC: 8.2 10*3/uL (ref 4.0–10.5)
WBC: 13.4 10*3/uL — ABNORMAL HIGH (ref 4.0–10.5)

## 2015-10-15 LAB — TRANSFUSION REACTION
DAT C3: NEGATIVE
Post RXN DAT IgG: NEGATIVE

## 2015-10-15 LAB — GLUCOSE, CAPILLARY
GLUCOSE-CAPILLARY: 84 mg/dL (ref 65–99)
Glucose-Capillary: 71 mg/dL (ref 65–99)
Glucose-Capillary: 76 mg/dL (ref 65–99)
Glucose-Capillary: 79 mg/dL (ref 65–99)
Glucose-Capillary: 85 mg/dL (ref 65–99)
Glucose-Capillary: 85 mg/dL (ref 65–99)

## 2015-10-15 LAB — CULTURE, BLOOD (ROUTINE X 2)
CULTURE: NO GROWTH
CULTURE: NO GROWTH

## 2015-10-15 LAB — MAGNESIUM
MAGNESIUM: 1.8 mg/dL (ref 1.7–2.4)
MAGNESIUM: 1.7 mg/dL (ref 1.7–2.4)
Magnesium: 1.6 mg/dL — ABNORMAL LOW (ref 1.7–2.4)

## 2015-10-15 LAB — PHOSPHORUS: PHOSPHORUS: 2.8 mg/dL (ref 2.5–4.6)

## 2015-10-15 LAB — PREPARE RBC (CROSSMATCH)

## 2015-10-15 MED ORDER — PANTOPRAZOLE SODIUM 40 MG IV SOLR
40.0000 mg | Freq: Two times a day (BID) | INTRAVENOUS | Status: DC
  Administered 2015-10-15 – 2015-10-26 (×23): 40 mg via INTRAVENOUS
  Filled 2015-10-15 (×26): qty 40

## 2015-10-15 MED ORDER — ACETAMINOPHEN 650 MG RE SUPP
650.0000 mg | Freq: Once | RECTAL | Status: AC
  Administered 2015-10-15: 650 mg via RECTAL
  Filled 2015-10-15: qty 1

## 2015-10-15 MED ORDER — SODIUM CHLORIDE 0.9 % IV SOLN: Freq: Once | INTRAVENOUS | Status: AC

## 2015-10-15 MED ORDER — ACETAMINOPHEN 325 MG PO TABS
650.0000 mg | ORAL_TABLET | Freq: Once | ORAL | Status: DC
Start: 2015-10-15 — End: 2015-10-15

## 2015-10-15 MED ORDER — POTASSIUM CHLORIDE CRYS ER 20 MEQ PO TBCR: 40.0000 meq | EXTENDED_RELEASE_TABLET | Freq: Once | ORAL | Status: DC

## 2015-10-15 MED ORDER — POTASSIUM CHLORIDE 10 MEQ/100ML IV SOLN
10.0000 meq | INTRAVENOUS | Status: AC
  Administered 2015-10-15 (×4): 10 meq via INTRAVENOUS
  Filled 2015-10-15 (×4): qty 100

## 2015-10-15 MED ORDER — SODIUM CHLORIDE 0.9 % IV SOLN
Freq: Once | INTRAVENOUS | Status: AC
  Administered 2015-10-15: 10:00:00 via INTRAVENOUS

## 2015-10-15 MED ORDER — DIPHENHYDRAMINE HCL 50 MG/ML IJ SOLN
25.0000 mg | Freq: Once | INTRAMUSCULAR | Status: AC
  Administered 2015-10-15: 25 mg via INTRAVENOUS
  Filled 2015-10-15: qty 1

## 2015-10-15 NOTE — Progress Notes (Signed)
CRITICAL VALUE ALERT  Critical value received:  Hemoglobin 6.2  Date of notification:  10/15/15  Time of notification:  0610  Critical value read back:Yes.    Nurse who received alert:  MHopper  MD notified (1st page):  [place]  Time of first page:  0610  MD notified (2nd page):  Time of second page:  Responding MD:  Karma GreaserBoswell  Time MD responded:  [phone]

## 2015-10-15 NOTE — Progress Notes (Signed)
PULMONARY / CRITICAL CARE MEDICINE   Name: Cassandra Wall MRN: [id number] DOB: Oct 16, 1980    ADMISSION DATE:  10/04/2015 CONSULTATION DATE:  10/06/15  REFERRING MD:  Patel,P  CHIEF COMPLAINT: Hypertensive urgency related to opioid withdrawl  HISTORY OF PRESENT ILLNESS:  Cassandra Wall is a 35 year old female with past medical history significant for polysubstance abuse(iv heroine and cocaine), tobacco abuse who presented to the ED with complaints of left sided weakness which was going on for two days prior to the admission.  CT of the brain was concerning for right sided infarct She was evaluated by neurology and they think it is septic emboli from IVDU. She has track marks all over her body. From her note it appears that her brother passed away with endocarditis due to IVDU. Rt side endocarditis.  SUBJECTIVE:  Intubated, comfortable. (-) fever < 24 hrs. More awake this am.  HR better   VITAL SIGNS: BP 143/91 mmHg  Pulse 88  Temp(Src) 98.8 F (37.1 C) (Rectal)  Resp 18  Ht  (1.727 m)  Wt 147 lb 7.8 oz (66.9 kg)  BMI 22.43 kg/m2  SpO2 100%  LMP  (LMP Unknown)  HEMODYNAMICS:    VENTILATOR SETTINGS: Vent Mode:  [-] PRVC FiO2 (%):  [40 %] 40 % Set Rate:  [14 bmp] 14 bmp Vt Set:  [490 mL] 490 mL PEEP:  [5 cmH20] 5 cmH20 Pressure Support:  [10 cmH20] 10 cmH20 Plateau Pressure:  [16 cmH20-18 cmH20] 18 cmH20  INTAKE / OUTPUT: I/O last 3 completed shifts: In: 1599.2 [I.V.:928.2; NG/GT:71; IV Piggyback:600] Out: 5170 [Urine:5170]  PHYSICAL EXAMINATION: General:  Comfortable, awake, follows commands Neuro: left sided weakness  HEENT:  Atraumatic, normocephalic, Pupils equal- ~30mm - left eye, could not open light. Trache in place Cardiovascular: regular. No MRG Lungs:  equal breath sounds, bibasilar crackles Abdomen:  Not Tender  no r/g,  Musculoskeletal:  Moving right upper to lower extremity nit not left  Skin: track marks all over the body, SCDs  present  *LABS:  BMET  Recent Labs Lab 10/14/15 1133 10/14/15 1515 10/14/15 2345  NA 143 142 145  K 3.2* 3.2* 3.1*  CL 105 107 103  CO2 BUN CREATININE 0.54 0.51 0.31*  GLUCOSE 100* 72 84    Electrolytes  Recent Labs Lab 10/08/15 2131  10/09/15 0857  10/13/15 2349 10/14/15 1133 10/14/15 1515 10/14/15 2345 10/15/15 0505  CALCIUM  --   < >  --   < > 8.3* 7.9* 7.5* 7.0*  --   MG 1.7  --  1.7  < > 1.9 1.6*  --  1.6*  --   PHOS 4.6  --  4.1  --   --   --   --   --  2.8  < > = values in this interval not displayed.  CBC  Recent Labs Lab 10/13/15 2349 10/14/15 0530 10/15/15 0505  WBC 16.0* 14.2* 8.2  HGB 8.0* 7.5* 6.2*  HCT 26.2* 23.8* 19.9*  PLT 261 240 241    Coag's  Recent Labs Lab 10/11/15 1138 10/13/15 1209  APTT 31 34  INR 1.35 1.33    Sepsis Markers No results for input(s): LATICACIDVEN, PROCALCITON, O2SATVEN in the last 168 hours.  ABG No results for input(s): PHART, PCO2ART, PO2ART in the last 168 hours.  Liver Enzymes  Recent Labs Lab 10/09/15 0450 10/10/15 0525  AST 22 27  ALT 17 17  ALKPHOS 79 65  BILITOT 0.8 0.8  ALBUMIN 1.5* 1.3*    Cardiac Enzymes No results for input(s): TROPONINI, PROBNP in the last 168 hours.  Glucose  Recent Labs Lab 10/14/15 1550 10/14/15 1707 10/14/15 2002 10/14/15 2357 10/15/15 0343 10/15/15 0850  GLUCAP 65 113* 93 85 85 71    Imaging Dg Chest Port 1 View  10/15/2015  CLINICAL DATA:  Acute respiratory failure. EXAM: PORTABLE CHEST 1 VIEW COMPARISON:  October 14, 2015 FINDINGS: No pneumothorax. Stable right PICC line. Patchy opacities in both lung bases with a small effusion on the right are again identified. The right basilar opacity is mildly worsened while the left is stable. IMPRESSION: Mild worsening of right basilar opacity. Stable opacity on the left. No other changes. Electronically Signed   By: Gerome Samavid  Williams III M.D   On: 10/15/2015 07:26   Dg Abd Portable  1v  10/14/2015  CLINICAL DATA:  Feeding tube placement EXAM: PORTABLE ABDOMEN - 1 VIEW COMPARISON:  October 06, 2015 FINDINGS: Feeding tube extends into the stomach Corey loops upon itself. The tip of the feeding tube is in the proximal stomach. Bowel gas pattern is normal. There is airspace consolidation in the left mid and lower lung zones. IMPRESSION: Feeding tube tip in stomach. Bowel gas pattern unremarkable. Areas of left mid and lower lung zone airspace consolidation. Electronically Signed   By: Bretta BangWilliam  Woodruff III M.D.   On: 10/14/2015 16:11  remarkably worse on the left today   STUDIES:  4/5 Chest CT W Contrast- Cavitary lesions- largest- on left - 31mm and on right- 36mm, bilat pleural effusions. 4/5 Head Ct with Contrast- Evolving, large subacute right MCA territory infarct with unchanged mass effect. 2. Small occipital infarcts better demonstrated on MRI.  4/1 MRI brain w Wo contrast- Large acute right MCA infarct, without hemorrhagic conversion or propagation. Evolving small bilateral occipital lobe infarcts. MRA HEAD: Mildly motion degraded examination. Acute RIGHT distal M1 occlusion consistent with embolic phenomena. 3/29 US renal . No hydronephrosis. 2. Increased parenchymal echogenicity bilaterally compatible with chronic medical renal disease. 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 No significant right-to-left shift or hemorrhagic transformation.BILATERAL occipital lobe hypodensities, subcentimeter size, could represent brain abscesses (versus acute subcortical infarcts). Consider continued surveillance with post infusion MRI. 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 EF of 50-55%  CULTURES: 3/28 BC > mssa 3/28 CSF-normal 3/30>>>  Staph aureus-MSSA again 4/3  B/c>>>  4/4 tracheal Cultures >>> Moraxella  ANTIBIOTICS: Naficillin 3/30>>> 4/6 Meropenem 4/6  >>  SIGNIFICANT EVENTS: 3/28 admitted 3/30 intubated. PCCM consult 4/4- Picc inserted, Central line removed. 4/6 Antibiotics changed from Nafcillin to Meropenem to broaden coverage to include Gram Neg and Anaerobes 4/6 Tracheostomy  LINES/TUBES: 3/30 ETT>>> 3/30 Central line >>>out 4/4 Picc 4/4 >>  DISCUSSION: 35 year old female with h/o polysubstance abuse now presents with right sided septic emboli ddue to IVDU. Major issues are debilitation s/p CVA, agitation, NAG metabolic acidosis.This has improved. We will cont supportive care. Off precedex. Hope we may be able to extubate her soon. Still spiking fevers. Imaging ordered- Head and chest.  ASSESSMENT / PLAN:  PULMONARY A: Acute Hypoxemic respiratory failure 2/2 > Multifocal PNA due to septic emboli Worsening airspace disease on left  Pulm edema Concern for G(-) PNA but rpt cultures with Moraxhella P:   Trache collar trial when able Needs diuresis- IV lasix 40 BID. Check lytes.  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  Needs diuresis- IV lasix  BID Q12H Bmet and MAg Goal K- >4, Mag >2.  GASTROINTESTINAL A:   ?mesentric ischemia possibly due to IVDU Anemia likley from sepsis P:   tolerating feeds pepcid BID for PUD > possible gastritis.  Check stool for blood > blood in stool   HEMATOLOGIC A:   Anemia, possible GIB/gastritis Thrombocytopenia r/t sepsis   P:  scds for DVT prophylaxis Heparin sq held 2/2 anemia Observe Hb and Hct.    INFECTIOUS A:   MSSA endocarditis with septic emboli. Now with Moraxella in  sputum (HCAP). Concern for G(-) PNA as well.  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  Encephalopathy  Heroine withdrawal  P:   RASS goal: -1 - 0 Cont precedex drip. Wean off fentanyl and versed. Cont seroquel, clonopin  FAMILY  - Updates: no family at bedside  - Inter-disciplinary family meet or Palliative Care meeting due by: 10/17/15  Critical care time spent on this pt : 35 minutes.   Pollie Meyer, MD 10/15/2015, 9:39 AM [facility] Pager [phone] After 3 pm or if no answer, call [phone]

## 2015-10-16 LAB — CBC
HEMATOCRIT: 28.6 % — AB (ref 36.0–46.0)
Hemoglobin: 8.7 g/dL — ABNORMAL LOW (ref 12.0–15.0)
MCH: 26.1 pg (ref 26.0–34.0)
MCHC: 30.4 g/dL (ref 30.0–36.0)
MCV: 85.9 fL (ref 78.0–100.0)
PLATELETS: 272 10*3/uL (ref 150–400)
RBC: 3.33 MIL/uL — ABNORMAL LOW (ref 3.87–5.11)
RDW: 15.4 % (ref 11.5–15.5)
WBC: 12.8 10*3/uL — AB (ref 4.0–10.5)

## 2015-10-16 LAB — BASIC METABOLIC PANEL
ANION GAP: 8 (ref 5–15)
ANION GAP: 8 (ref 5–15)
BUN: 13 mg/dL (ref 6–20)
BUN: 11 mg/dL (ref 6–20)
CALCIUM: 8.3 mg/dL — AB (ref 8.9–10.3)
CHLORIDE: 99 mmol/L — AB (ref 101–111)
CHLORIDE: 98 mmol/L — AB (ref 101–111)
CO2: 33 mmol/L — AB (ref 22–32)
CO2: 34 mmol/L — AB (ref 22–32)
Calcium: 8.5 mg/dL — ABNORMAL LOW (ref 8.9–10.3)
Creatinine, Ser: 0.47 mg/dL (ref 0.44–1.00)
Creatinine, Ser: 0.42 mg/dL — ABNORMAL LOW (ref 0.44–1.00)
GFR calc non Af Amer: >60 mL/min (ref >60)
GFR calc non Af Amer: >60 mL/min (ref >60)
Glucose, Bld: 126 mg/dL — ABNORMAL HIGH (ref 65–99)
Glucose, Bld: 106 mg/dL — ABNORMAL HIGH (ref 65–99)
Potassium: 3.8 mmol/L (ref 3.5–5.1)
Potassium: 3.8 mmol/L (ref 3.5–5.1)
SODIUM: 140 mmol/L (ref 135–145)
SODIUM: 140 mmol/L (ref 135–145)

## 2015-10-16 LAB — TYPE AND SCREEN
ABO/RH(D): O POS
ANTIBODY SCREEN: NEGATIVE
UNIT DIVISION: 0

## 2015-10-16 LAB — GLUCOSE, CAPILLARY
GLUCOSE-CAPILLARY: 102 mg/dL — AB (ref 65–99)
GLUCOSE-CAPILLARY: 114 mg/dL — AB (ref 65–99)
GLUCOSE-CAPILLARY: 126 mg/dL — AB (ref 65–99)
GLUCOSE-CAPILLARY: 95 mg/dL (ref 65–99)
Glucose-Capillary: 100 mg/dL — ABNORMAL HIGH (ref 65–99)
Glucose-Capillary: 127 mg/dL — ABNORMAL HIGH (ref 65–99)

## 2015-10-16 LAB — MAGNESIUM
MAGNESIUM: 1.8 mg/dL (ref 1.7–2.4)
MAGNESIUM: 1.9 mg/dL (ref 1.7–2.4)

## 2015-10-16 LAB — PHOSPHORUS: Phosphorus: 3.5 mg/dL (ref 2.5–4.6)

## 2015-10-16 MED ORDER — MAGNESIUM SULFATE IN D5W 10-5 MG/ML-% IV SOLN
1.0000 g | Freq: Once | INTRAVENOUS | Status: AC
  Administered 2015-10-16: 1 g via INTRAVENOUS
  Filled 2015-10-16: qty 100

## 2015-10-16 MED ORDER — POTASSIUM CHLORIDE 20 MEQ/15ML (10%) PO SOLN
40.0000 meq | Freq: Once | ORAL | Status: AC
  Administered 2015-10-16: 40 meq
  Filled 2015-10-16: qty 30

## 2015-10-16 MED ORDER — FUROSEMIDE 10 MG/ML IJ SOLN
60.0000 mg | Freq: Two times a day (BID) | INTRAMUSCULAR | Status: DC
  Administered 2015-10-16 – 2015-10-17 (×2): 60 mg via INTRAVENOUS
  Filled 2015-10-16 (×4): qty 6

## 2015-10-16 MED ORDER — POTASSIUM CHLORIDE 20 MEQ/15ML (10%) PO SOLN
20.0000 meq | Freq: Once | ORAL | Status: AC
  Administered 2015-10-16: 20 meq
  Filled 2015-10-16: qty 15

## 2015-10-16 NOTE — Progress Notes (Signed)
Pt placed on full vent support due to increased WOB, RR and SOB.  Pt suctioned for thick brown moderated amount of secretions, some mucus plugs noted.  Pt lavaged and suctioned. RN gave sedation.  RT will continue to monitor.

## 2015-10-16 NOTE — Progress Notes (Signed)
Pharmacy Antibiotic Note  Cassandra Wall is a 35 y.o. female admitted on 10/04/2015 with AMS, drug abuse treating MSSA endocarditis .  Pharmacy has been consulted for meropenem dosing.  Patient's renal function is stable.  Plan: - Continue Merrem 2gm IV Q8H - Monitor renal function, clinical progress, LOT  Height: 5\' 8"  (172.7 cm) Weight: 148 lb 5.9 oz (67.3 kg) IBW/kg (Calculated) : 63.9  Temp (24hrs), Avg:98.9 F (37.2 C), Min:97.3 F (36.3 C), Max:102.2 F (39 C)   Recent Labs Lab 10/13/15 2349 10/14/15 0530 10/14/15 1133 10/14/15 1515 10/14/15 2345 10/15/15 0505 10/15/15 1356 10/15/15 1359 10/15/15 2312 10/16/15 0405  WBC 16.0* 14.2*  --   --   --  8.2  --  13.4*  --  12.8*  CREATININE 0.51  --  0.54 0.51 0.31*  --  0.48  --  0.57  --     Estimated Creatinine Clearance: 100.9 mL/min (by C-G formula based on Cr of 0.57).    Allergies  Allergen Reactions  . Wheat Anaphylaxis  . Ciprofloxacin Hcl     Reports as resistant   . Latex Hives, Itching and Rash    Antimicrobials this admission: 3/28 Vanc>>3/30  3/28 Rocephin x 1 dose  3/28 Ampicillinx 1 dose  3/28 Cefepime >>3/29  3/29 Cefazolin >>3/30  3/30 nafcillin >> 04/06 04/06 meropenem >>  Microbiology results: 3/28 BCx - MSSA 3/28 CSF Cx - negative 3/30 BCx2 - MSSA 04/01 resp cx - moderate candida 04/03 BC x2 - negative 4/4 TA - Moraxella catarrhalis 4/9 UCx -    Ople Girgis D. Laney Potashang, PharmD, BCPS Pager:  [phone] 10/16/2015, 11:20 AM

## 2015-10-16 NOTE — Progress Notes (Signed)
eLink Physician-Brief Progress Note Patient Name: Cassandra ChouSamantha XXXSpradley DOB: 12-15-1980 MRN: [id number]   Date of Service  10/16/2015  HPI/Events of Note  Hypokalemia  eICU Interventions  Potassium replaced     Intervention Category Minor Interventions: Electrolytes abnormality - evaluation and management  DETERDING,ELIZABETH 10/16/2015, 12:53 AM

## 2015-10-16 NOTE — Progress Notes (Signed)
eLink Physician-Brief Progress Note Patient Name: Cassandra ChouSamantha Wall DOB: Mar 15, 1981 MRN: [id number]   Date of Service  10/16/2015  HPI/Events of Note  Request to renew soft wrist restraints.  eICU Interventions  Will renew order for soft wrist restraints.     Intervention Category Minor Interventions: Agitation / anxiety - evaluation and management  Sommer,Steven Eugene 10/16/2015, 7:45 PM

## 2015-10-16 NOTE — Progress Notes (Signed)
PULMONARY / CRITICAL CARE MEDICINE   Name: Cassandra Wall MRN: [id number] DOB: 11-19-1980    ADMISSION DATE:  10/04/2015 CONSULTATION DATE:  10/06/15  REFERRING MD:  Patel,P  CHIEF COMPLAINT: Hypertensive urgency related to opioid withdrawl  HISTORY OF PRESENT ILLNESS:  Cassandra Wall is a 35 year old female with past medical history significant for polysubstance abuse(iv heroine and cocaine), tobacco abuse who presented to the ED with complaints of left sided weakness which was going on for two days prior to the admission.  CT of the brain was concerning for right sided infarct She was evaluated by neurology and they think it is septic emboli from IVDU. She has track marks all over her body. From her note it appears that her brother passed away with endocarditis due to IVDU. Rt side endocarditis.  SUBJECTIVE:  Intubated, comfortable. (-) fever < 24 hrs. More awake this am.  HR better  VITAL SIGNS: BP 107/67 mmHg  Pulse 77  Temp(Src) 97.3 F (36.3 C) (Oral)  Resp 16  Ht 5\' 8"  (1.727 m)  Wt 148 lb 5.9 oz (67.3 kg)  BMI 22.56 kg/m2  SpO2 100%  LMP  (LMP Unknown)  HEMODYNAMICS:   VENTILATOR SETTINGS: Vent Mode:  [-] PRVC FiO2 (%):  [35 %-40 %] 40 % Set Rate:  [14 bmp] 14 bmp Vt Set:  [490 mL] 490 mL PEEP:  [5 cmH20] 5 cmH20 Plateau Pressure:  [18 cmH20-26 cmH20] 18 cmH20  INTAKE / OUTPUT: I/O last 3 completed shifts: In: 3522.3 [I.V.:1247.3; Blood:335; NG/GT:1140; IV Piggyback:800] Out: 4360 [Urine:4360]  PHYSICAL EXAMINATION: General:  Awake, following commands, eyes open spontaneosly Neuro: Moving all extremities- R > L, nods her head in answer to questions HEENT:  Atraumatic, normocephalic, Tracheostomy in place Cardiovascular: regular. No MRG Lungs:  Equal, rhonchi. Abdomen:  Not Tender , no r/g,  Musculoskeletal:  Moving all extremities, R >L Skin: track marks all over the body, SCDs present  *LABS:  BMET  Recent Labs Lab 10/14/15 2345  10/15/15 1356 10/15/15 2312  NA 145 142 139  K 3.1* 4.4 3.5  CL 103 102 99*  CO2 31 30 29   BUN 9 10 11   CREATININE 0.31* 0.48 0.57  GLUCOSE 84 90 95    Electrolytes  Recent Labs Lab 10/14/15 2345 10/15/15 0505 10/15/15 1356 10/15/15 2312 10/16/15 0405  CALCIUM 7.0*  --  8.4* 8.2*  --   MG 1.6*  --  1.8 1.7  --   PHOS  --  2.8  --   --  3.5    CBC  Recent Labs Lab 10/15/15 0505 10/15/15 1359 10/16/15 0405  WBC 8.2 13.4* 12.8*  HGB 6.2* 8.6* 8.7*  HCT 19.9* 28.3* 28.6*  PLT 241 271 272    Coag's  Recent Labs Lab 10/11/15 1138 10/13/15 1209  APTT 31 34  INR 1.35 1.33    Sepsis Markers No results for input(s): LATICACIDVEN, PROCALCITON, O2SATVEN in the last 168 hours.  ABG No results for input(s): PHART, PCO2ART, PO2ART in the last 168 hours.  Liver Enzymes  Recent Labs Lab 10/10/15 0525  AST 27  ALT 17  ALKPHOS 65  BILITOT 0.8  ALBUMIN 1.3*    Cardiac Enzymes No results for input(s): TROPONINI, PROBNP in the last 168 hours.  Glucose  Recent Labs Lab 10/15/15 0850 10/15/15 1215 10/15/15 1625 10/15/15 2015 10/16/15 0039 10/16/15 0423  GLUCAP 71 76 79 84 95 102*    Imaging Dg Abd Portable 1v  10/15/2015  CLINICAL DATA:  Evaluate feeding tube position after placement at bedside. EXAM: PORTABLE ABDOMEN - 1 VIEW COMPARISON:  10/14/2015 and earlier. FINDINGS: Feeding tube looped in the stomach with its tip in the fundus. Bowel gas pattern unremarkable. IMPRESSION: 1. Feeding tube looped in the stomach with its tip in the fundus. 2. No acute abdominal abnormality. Electronically Signed   By: Hulan Saas M.D.   On: 10/15/2015 16:16  remarkably worse on the left today   STUDIES:  4/5 Chest CT W Contrast- Cavitary lesions- largest- on left - 31mm and on right- 36mm, bilat pleural effusions. 4/5 Head Ct with Contrast- Evolving, large subacute right MCA territory infarct with unchanged mass effect. 2. Small occipital infarcts better  demonstrated on MRI.  4/1 MRI brain w Wo contrast- Large acute right MCA infarct, without hemorrhagic conversion or propagation. Evolving small bilateral occipital lobe infarcts. MRA HEAD: Mildly motion degraded examination. Acute RIGHT distal M1 occlusion consistent with embolic phenomena. 3/29 US renal . No hydronephrosis. 2. Increased parenchymal echogenicity bilaterally compatible with chronic medical renal disease. 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 No significant right-to-left shift or hemorrhagic transformation.BILATERAL occipital lobe hypodensities, subcentimeter size, could represent brain abscesses (versus acute subcortical infarcts). Consider continued surveillance with post infusion MRI. 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 EF of 50-55%  CULTURES: 3/28 BC > mssa 3/28 CSF-normal 3/30>>>  Staph aureus-MSSA again 4/3  B/c>>> FINAL No growth. 4/4 tracheal Cultures >>> Moraxella Final.  ANTIBIOTICS: Naficillin 3/30>>> 4/6 Meropenem 4/6 >>   SIGNIFICANT EVENTS: 3/28 admitted 3/30 intubated. PCCM consult 4/4- Picc inserted, Central line removed. 4/6 Antibiotics changed from Nafcillin to Meropenem to broaden coverage to include Gram Neg and Anaerobes 4/6 Tracheostomy  LINES/TUBES: 3/30 ETT>>> 4/6 3/30 Central line >>>out 4/4 Picc 4/4 >>  Tracheostomy 4/6 >>  DISCUSSION: 35 year old female with h/o polysubstance abuse now presents with right sided septic emboli ddue to IVDU. Major issues are debilitation s/p CVA, agitation, NAG metabolic acidosis.This has improved. We will cont supportive care. Off precedex. Still spiking fevers. Head and chest Ct chest 4/5- evolving large infarct and chest Ct with bilat. effusions.  ASSESSMENT / PLAN:  PULMONARY A: Acute Hypoxemic respiratory failure 2/2 > Multifocal PNA due to septic emboli Worsening  airspace disease on left  Pulm edema Concern for G(-) PNA but rpt cultures with Moraxella P:   Trache collar trial when able Cont diuresis- IV lasix 40 BID. Check lytes.  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  GASTROINTESTINAL A:   ?mesentric ischemia possibly due to IVDU Anemia likley from sepsis P:   tolerating feeds pepcid BID for PUD > possible gastritis.  Check stool for blood > blood in stool  HEMATOLOGIC A:   Anemia, possible GIB/gastritis Thrombocytopenia r/t sepsis   P:  scds for DVT prophylaxis Heparin sq held 2/2 anemia Observe Hb  and Hct.   INFECTIOUS A:   MSSA endocarditis with septic emboli. Now with Moraxella in sputum (HCAP). Concern for G(-) PNA as well.  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  Encephalopathy  Heroine withdrawal  P:   RASS goal: -1 - 0  Wean off precedex drip. Wean off fentanyl and versed. Cont seroquel, clonopin  FAMILY  - Updates: no family at bedside  - Inter-disciplinary family meet or Palliative Care meeting due by: 10/17/15  Critical care time spent on this pt : 35 minutes.   Inocente Salles, MD PGY-3 IMTS 10:03 AM  10/16/2015

## 2015-10-17 ENCOUNTER — Inpatient Hospital Stay (HOSPITAL_COMMUNITY): Payer: Medicaid Other

## 2015-10-17 DIAGNOSIS — I63411 Cerebral infarction due to embolism of right middle cerebral artery: Secondary | ICD-10-CM

## 2015-10-17 DIAGNOSIS — F1990 Other psychoactive substance use, unspecified, uncomplicated: Secondary | ICD-10-CM

## 2015-10-17 LAB — URINE CULTURE: Culture: >=100000 — AB

## 2015-10-17 LAB — BASIC METABOLIC PANEL
Anion gap: 9 (ref 5–15)
BUN: 10 mg/dL (ref 6–20)
CALCIUM: 8.2 mg/dL — AB (ref 8.9–10.3)
CO2: 33 mmol/L — ABNORMAL HIGH (ref 22–32)
Chloride: 97 mmol/L — ABNORMAL LOW (ref 101–111)
Creatinine, Ser: 0.38 mg/dL — ABNORMAL LOW (ref 0.44–1.00)
GFR calc Af Amer: >60 mL/min (ref >60)
GLUCOSE: 117 mg/dL — AB (ref 65–99)
POTASSIUM: 3.9 mmol/L (ref 3.5–5.1)
SODIUM: 139 mmol/L (ref 135–145)

## 2015-10-17 LAB — GLUCOSE, CAPILLARY
GLUCOSE-CAPILLARY: 116 mg/dL — AB (ref 65–99)
GLUCOSE-CAPILLARY: 116 mg/dL — AB (ref 65–99)
Glucose-Capillary: 101 mg/dL — ABNORMAL HIGH (ref 65–99)
Glucose-Capillary: 113 mg/dL — ABNORMAL HIGH (ref 65–99)
Glucose-Capillary: 89 mg/dL (ref 65–99)
Glucose-Capillary: 91 mg/dL (ref 65–99)

## 2015-10-17 LAB — CBC
HCT: 27.7 % — ABNORMAL LOW (ref 36.0–46.0)
Hemoglobin: 8.3 g/dL — ABNORMAL LOW (ref 12.0–15.0)
MCH: 25.8 pg — AB (ref 26.0–34.0)
MCHC: 30 g/dL (ref 30.0–36.0)
MCV: 86 fL (ref 78.0–100.0)
PLATELETS: 260 10*3/uL (ref 150–400)
RBC: 3.22 MIL/uL — ABNORMAL LOW (ref 3.87–5.11)
RDW: 15.3 % (ref 11.5–15.5)
WBC: 6.8 10*3/uL (ref 4.0–10.5)

## 2015-10-17 LAB — PHOSPHORUS: PHOSPHORUS: 2.9 mg/dL (ref 2.5–4.6)

## 2015-10-17 LAB — MAGNESIUM: MAGNESIUM: 1.8 mg/dL (ref 1.7–2.4)

## 2015-10-17 MED ORDER — LORAZEPAM 2 MG/ML IJ SOLN
INTRAMUSCULAR | Status: AC
  Administered 2015-10-17: 2 mg
  Filled 2015-10-17: qty 1

## 2015-10-17 MED ORDER — NAFCILLIN SODIUM 2 G IJ SOLR
2.0000 g | INTRAMUSCULAR | Status: DC
  Filled 2015-10-17: qty 2000

## 2015-10-17 MED ORDER — MAGNESIUM SULFATE 2 GM/50ML IV SOLN
2.0000 g | Freq: Once | INTRAVENOUS | Status: AC
  Administered 2015-10-17: 2 g via INTRAVENOUS
  Filled 2015-10-17: qty 50

## 2015-10-17 MED ORDER — ACETAMINOPHEN 160 MG/5ML PO SOLN
650.0000 mg | ORAL | Status: AC
  Administered 2015-10-17: 650 mg
  Filled 2015-10-17: qty 20.3

## 2015-10-17 MED ORDER — POTASSIUM CHLORIDE 20 MEQ/15ML (10%) PO SOLN
40.0000 meq | Freq: Once | ORAL | Status: AC
  Administered 2015-10-17: 40 meq via ORAL
  Filled 2015-10-17: qty 30

## 2015-10-17 MED ORDER — SODIUM CHLORIDE 0.9 % IV BOLUS (SEPSIS)
1000.0000 mL | Freq: Once | INTRAVENOUS | Status: AC
  Administered 2015-10-17: 500 mL via INTRAVENOUS

## 2015-10-17 MED ORDER — SODIUM CHLORIDE 0.9 % IV BOLUS (SEPSIS)
500.0000 mL | Freq: Once | INTRAVENOUS | Status: AC
  Administered 2015-10-17: 500 mL via INTRAVENOUS

## 2015-10-17 MED ORDER — POTASSIUM CHLORIDE 20 MEQ PO PACK
40.0000 meq | PACK | Freq: Once | ORAL | Status: AC
  Administered 2015-10-17: 40 meq via ORAL
  Filled 2015-10-17: qty 2

## 2015-10-17 MED ORDER — LORAZEPAM 2 MG/ML IJ SOLN
INTRAMUSCULAR | Status: AC
  Administered 2015-10-17: 5 mg
  Filled 2015-10-17: qty 3

## 2015-10-17 MED ORDER — NAFCILLIN SODIUM 2 G IJ SOLR
2.0000 g | Freq: Four times a day (QID) | INTRAVENOUS | Status: DC
  Administered 2015-10-18 (×2): 2 g via INTRAVENOUS
  Filled 2015-10-17 (×4): qty 2000

## 2015-10-17 NOTE — Progress Notes (Signed)
PULMONARY / CRITICAL CARE MEDICINE   Name: Reagan Behlke MRN: [id number] DOB: Oct 19, 1980    ADMISSION DATE:  10/04/2015 CONSULTATION DATE:  10/06/15  REFERRING MD:  Patel,P  CHIEF COMPLAINT: Hypertensive urgency related to opioid withdrawl  HISTORY OF PRESENT ILLNESS:  LYNZEE LINDQUIST is a 35 year old female with past medical history significant for polysubstance abuse(iv heroine and cocaine), tobacco abuse who presented to the ED with complaints of left sided weakness which was going on for two days prior to the admission.  CT of the brain was concerning for right sided infarct She was evaluated by neurology and they think it is septic emboli from IVDU. She has track marks all over her body. From her note it appears that her brother passed away with endocarditis due to IVDU. Rt side endocarditis.  SUBJECTIVE:  Drowsy today. Opening eyes to voice. Moving all extremities. Obeying commands. No pain.  VITAL SIGNS: BP 83/46 mmHg  Pulse 115  Temp(Src) 102 F (38.9 C) (Rectal)  Resp 15  Ht  (1.727 m)  Wt 151 lb 14.4 oz (68.9 kg)  BMI 23.10 kg/m2  SpO2 99%  LMP  (LMP Unknown)  HEMODYNAMICS:   VENTILATOR SETTINGS: Vent Mode:  [-] PRVC FiO2 (%):  [40 %] 40 % Set Rate:  [14 bmp] 14 bmp Vt Set:  [490 mL] 490 mL PEEP:  [5 cmH20] 5 cmH20 Plateau Pressure:  [19 cmH20-31 cmH20] 31 cmH20  INTAKE / OUTPUT: I/O last 3 completed shifts: In: 4105.3 [I.V.:1105.3; NG/GT:2400; IV Piggyback:600] Out: 5425 [Urine:5425]  PHYSICAL EXAMINATION: General:  Awake, following commands, eyes open spontaneosly Neuro: Moving all extremities- R > L, nods her head in answer to questions HEENT:  Atraumatic, normocephalic, Tracheostomy in place Cardiovascular: regular. No MRG Lungs:  Equal, rhonchi. Abdomen:  Not Tender , no r/g,  Musculoskeletal:  Moving all extremities, R >L Skin: track marks all over the body, SCDs present  LABS:  BMET  Recent Labs Lab 10/16/15 1119  10/16/15 2308 10/17/15 1222  NA 140 140 139  K 3.8 3.8 3.9  CL 99* 98* 97*  CO2 33* 34* 33*  BUN CREATININE 0.47 0.42* 0.38*  GLUCOSE 126* 106* 117*    Electrolytes  Recent Labs Lab 10/15/15 0505  10/16/15 0405 10/16/15 1119 10/16/15 2308 10/17/15 0405 10/17/15 1222  CALCIUM  --   < >  --  8.5* 8.3*  --  8.2*  MG  --   < >  --  1.8 1.9  --  1.8  PHOS 2.8  --  3.5  --   --  2.9  --   < > = values in this interval not displayed.  CBC  Recent Labs Lab 10/15/15 1359 10/16/15 0405 10/17/15 0405  WBC 13.4* 12.8* 6.8  HGB 8.6* 8.7* 8.3*  HCT 28.3* 28.6* 27.7*  PLT 271 272 260    Coag's  Recent Labs Lab 10/11/15 1138 10/13/15 1209  APTT 31 34  INR 1.35 1.33    Sepsis Markers No results for input(s): LATICACIDVEN, PROCALCITON, O2SATVEN in the last 168 hours.  ABG No results for input(s): PHART, PCO2ART, PO2ART in the last 168 hours.  Liver Enzymes No results for input(s): AST, ALT, ALKPHOS, BILITOT, ALBUMIN in the last 168 hours.  Cardiac Enzymes No results for input(s): TROPONINI, PROBNP in the last 168 hours.  Glucose  Recent Labs Lab 10/16/15 1644 10/16/15 1946 10/17/15 0011 10/17/15 0404 10/17/15 0837 10/17/15 1158  GLUCAP 114* 127* 91 113* 89  101*    Imaging Dg Chest Port 1 View  10/17/2015  CLINICAL DATA:  Septic pulmonary embolus. EXAM: PORTABLE CHEST 1 VIEW COMPARISON:  10/15/2015 FINDINGS: Bilateral airspace disease noted, right greater than left, worsening on the right since prior study. Small bilateral effusions suspected. Heart is normal size. Tracheostomy tube is unchanged. Interval placement of feeding tube which appears to fullness stomach with the tip in the fundus. IMPRESSION: Worsening right lung airspace disease, with continued bilateral airspace disease and small effusions. Electronically Signed   By: Charlett Nose M.D.   On: 10/17/2015 07:09  remarkably worse on the left today   STUDIES:  4/5 Chest CT W Contrast-  Cavitary lesions- largest- on left - 31mm and on right- 36mm, bilat pleural effusions. 4/5 Head Ct with Contrast- Evolving, large subacute right MCA territory infarct with unchanged mass effect. 2. Small occipital infarcts better demonstrated on MRI.  4/1 MRI brain w Wo contrast- Large acute right MCA infarct, without hemorrhagic conversion or propagation. Evolving small bilateral occipital lobe infarcts. MRA HEAD: Mildly motion degraded examination. Acute RIGHT distal M1 occlusion consistent with embolic phenomena. 3/29 US renal . No hydronephrosis. 2. Increased parenchymal echogenicity bilaterally compatible with chronic medical renal disease. 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 No significant right-to-left shift or hemorrhagic transformation.BILATERAL occipital lobe hypodensities, subcentimeter size, could represent brain abscesses (versus acute subcortical infarcts). Consider continued surveillance with post infusion MRI. 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 EF of 50-55%  CULTURES: 3/28 BC > mssa 3/28 CSF-normal 3/30>>>  Staph aureus-MSSA again 4/3  B/c>>> FINAL No growth. 4/4 tracheal Cultures >>> Moraxella Final.  ANTIBIOTICS: Naficillin 3/30>>> 4/6 Meropenem 4/6 >> 4/10 Resumed NAfcillin 4/10 >>  SIGNIFICANT EVENTS: 3/28 admitted 3/30 intubated. PCCM consult 4/4- Picc inserted, Central line removed. 4/6 Antibiotics changed from Nafcillin to Meropenem to broaden coverage to include Gram Neg and Anaerobes 4/6 Tracheostomy 4/10- Meropenem d/c, narrowed back to Nafcillin  LINES/TUBES: 3/30 ETT>>> 4/6 3/30 Central line >>>out 4/4 Picc 4/4 >>  Tracheostomy 4/6 >>  DISCUSSION: 35 year old female with h/o polysubstance abuse now presents with right sided septic emboli ddue to IVDU. Major issues are debilitation s/p CVA, agitation, NAG metabolic  acidosis.This has improved. We will cont supportive care. Off precedex. Still spiking fevers. Head and chest Ct chest 4/5- evolving large infarct and chest Ct with bilat. effusions.  ASSESSMENT / PLAN:  PULMONARY A: Acute Hypoxemic respiratory failure 2/2 > Multifocal PNA due to septic emboli Worsening airspace disease on left  Pulm edema Concern for G(-) PNA but rpt cultures with Moraxella P:   Trache collar trial when able Cont diuresis- IV lasix 60 BID. Chest Ct- Shows septic emboli with cavity, with pleural effusions, no indication to tap at this time Cont abx- now narrowed to nafcillin Chest xray- Worsening Rt airspace dx- likely consolidation.  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  RENAL A:   AKI Hypokalemia - with diuresis P:   On TF, she is not getting free water, large input from tube feeds Cont lasix to  BID Monitor Cr  Q12H Bmet and MAg Goal K- >4, Mag >2.  GASTROINTESTINAL A:   ?mesentric  ischemia possibly due to IVDU Anemia likely from sepsis P:   tolerating feeds pepcid BID for PUD > possible gastritis.  FOBT positive, no gross GI bleed  HEMATOLOGIC A:   Anemia, possible GIB/gastritis Thrombocytopenia r/t sepsis   P:  scds for DVT prophylaxis Heparin sq held 2/2 anemia, large brain infarct Observe Hb and Hct.   INFECTIOUS A:   MSSA endocarditis with septic emboli. Now with Moraxella in sputum (HCAP). Concern for G(-) PNA as well.  Suspected cellulitis involving the drug injection site Persistent fever- 012- 4/10 Mild leukocytosis P:   Cont Meropenem  Appreciate ID recs- narrow back to nafcillin, considering burden of infection, no surprise she is still spiking fevers Urine cultures-  Candida, pt has an indwelling foley. 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  Encephalopathy  Heroine withdrawal  P:   RASS goal: -1 - 0  Wean off precedex drip. Wean off fentanyl and versed. Cont seroquel, clonopin  FAMILY  - Updates: no family at bedside.  Inocente SallesEjiro Wania Longstreth, MD PGY-3, IMTS 2:51 PM 10/17/2015

## 2015-10-17 NOTE — Progress Notes (Signed)
RT responded to place pt. Back on full support due to RR in the 50's. Pt. Was placed back on full support, bagged & lavaged & inner cannula was changed. CCM was made aware & gave RN orders to give Ativan. Pt. Still has ventilator dyssynchrony with RR ranging from 40's-50's.

## 2015-10-17 NOTE — Progress Notes (Signed)
Patient EKG monitor reading HR in 150's.  Upon assessment, patient is shaking uncontrollably but responding appropriately.  Nods to asking if she is in pain.  Patient breathing >45 times a min.  Temp slowly climbing.  Made MD aware.  MD came to bedside, patient responding to MD but remains the same. Place back on the vent.  Ativan and Versed given to sedate and calm the patient.  Fentanyl drip returned to dose prior to WUA.  Tylenol given for fever.
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
## 2015-10-18 ENCOUNTER — Inpatient Hospital Stay (HOSPITAL_COMMUNITY): Payer: Medicaid Other

## 2015-10-18 ENCOUNTER — Other Ambulatory Visit: Payer: Self-pay

## 2015-10-18 DIAGNOSIS — J9600 Acute respiratory failure, unspecified whether with hypoxia or hypercapnia: Secondary | ICD-10-CM

## 2015-10-18 DIAGNOSIS — J918 Pleural effusion in other conditions classified elsewhere: Secondary | ICD-10-CM

## 2015-10-18 DIAGNOSIS — B9689 Other specified bacterial agents as the cause of diseases classified elsewhere: Secondary | ICD-10-CM

## 2015-10-18 LAB — CBC
HCT: 25.3 % — ABNORMAL LOW (ref 36.0–46.0)
HEMOGLOBIN: 7.7 g/dL — AB (ref 12.0–15.0)
MCH: 26.7 pg (ref 26.0–34.0)
MCHC: 30.4 g/dL (ref 30.0–36.0)
MCV: 87.8 fL (ref 78.0–100.0)
PLATELETS: 240 10*3/uL (ref 150–400)
RBC: 2.88 MIL/uL — ABNORMAL LOW (ref 3.87–5.11)
RDW: 15.5 % (ref 11.5–15.5)
WBC: 6 10*3/uL (ref 4.0–10.5)

## 2015-10-18 LAB — BASIC METABOLIC PANEL
ANION GAP: 11 (ref 5–15)
ANION GAP: 5 (ref 5–15)
BUN: 10 mg/dL (ref 6–20)
BUN: 11 mg/dL (ref 6–20)
CALCIUM: 8.3 mg/dL — AB (ref 8.9–10.3)
CHLORIDE: 101 mmol/L (ref 101–111)
CHLORIDE: 103 mmol/L (ref 101–111)
CO2: 28 mmol/L (ref 22–32)
CO2: 33 mmol/L — AB (ref 22–32)
CREATININE: 0.37 mg/dL — AB (ref 0.44–1.00)
Calcium: 8.2 mg/dL — ABNORMAL LOW (ref 8.9–10.3)
Creatinine, Ser: 0.48 mg/dL (ref 0.44–1.00)
GFR calc non Af Amer: >60 mL/min (ref >60)
GFR calc non Af Amer: >60 mL/min (ref >60)
GLUCOSE: 99 mg/dL (ref 65–99)
Glucose, Bld: 110 mg/dL — ABNORMAL HIGH (ref 65–99)
POTASSIUM: 4.6 mmol/L (ref 3.5–5.1)
Potassium: 3.7 mmol/L (ref 3.5–5.1)
Sodium: 140 mmol/L (ref 135–145)
Sodium: 141 mmol/L (ref 135–145)

## 2015-10-18 LAB — GLUCOSE, CAPILLARY
GLUCOSE-CAPILLARY: 127 mg/dL — AB (ref 65–99)
Glucose-Capillary: 101 mg/dL — ABNORMAL HIGH (ref 65–99)
Glucose-Capillary: 105 mg/dL — ABNORMAL HIGH (ref 65–99)
Glucose-Capillary: 114 mg/dL — ABNORMAL HIGH (ref 65–99)
Glucose-Capillary: 125 mg/dL — ABNORMAL HIGH (ref 65–99)
Glucose-Capillary: 144 mg/dL — ABNORMAL HIGH (ref 65–99)

## 2015-10-18 LAB — PHOSPHORUS: Phosphorus: 3.2 mg/dL (ref 2.5–4.6)

## 2015-10-18 LAB — MAGNESIUM
MAGNESIUM: 1.9 mg/dL (ref 1.7–2.4)
MAGNESIUM: 2 mg/dL (ref 1.7–2.4)
Magnesium: 1.8 mg/dL (ref 1.7–2.4)

## 2015-10-18 MED ORDER — ACYCLOVIR 200 MG/5ML PO SUSP
400.0000 mg | Freq: Three times a day (TID) | ORAL | Status: DC
  Administered 2015-10-18 – 2015-10-25 (×21): 400 mg via ORAL
  Filled 2015-10-18 (×22): qty 10

## 2015-10-18 MED ORDER — QUETIAPINE FUMARATE 100 MG PO TABS
100.0000 mg | ORAL_TABLET | Freq: Two times a day (BID) | ORAL | Status: DC
  Administered 2015-10-18 – 2015-10-26 (×16): 100 mg via ORAL
  Filled 2015-10-18 (×16): qty 1

## 2015-10-18 MED ORDER — POTASSIUM CHLORIDE 20 MEQ/15ML (10%) PO SOLN
40.0000 meq | Freq: Once | ORAL | Status: AC
  Administered 2015-10-18: 40 meq via ORAL
  Filled 2015-10-18: qty 30

## 2015-10-18 MED ORDER — CLONAZEPAM 1 MG PO TABS
2.0000 mg | ORAL_TABLET | Freq: Three times a day (TID) | ORAL | Status: DC
  Administered 2015-10-18 – 2015-10-26 (×24): 2 mg
  Filled 2015-10-18 (×10): qty 4
  Filled 2015-10-18: qty 2
  Filled 2015-10-18 (×13): qty 4
  Filled 2015-10-18: qty 2

## 2015-10-18 MED ORDER — LORAZEPAM 2 MG/ML IJ SOLN
1.0000 mg | Freq: Once | INTRAMUSCULAR | Status: AC
  Administered 2015-10-18: 1 mg via INTRAVENOUS
  Filled 2015-10-18: qty 1

## 2015-10-18 MED ORDER — FUROSEMIDE 10 MG/ML IJ SOLN
20.0000 mg | Freq: Two times a day (BID) | INTRAMUSCULAR | Status: DC
  Administered 2015-10-18 – 2015-10-26 (×16): 20 mg via INTRAVENOUS
  Filled 2015-10-18 (×19): qty 2

## 2015-10-18 MED ORDER — CLOTRIMAZOLE 2 % VA CREA
1.0000 | TOPICAL_CREAM | Freq: Every day | VAGINAL | Status: AC
  Administered 2015-10-18 – 2015-10-20 (×3): 1 via VAGINAL
  Filled 2015-10-18 (×2): qty 21

## 2015-10-18 MED ORDER — SODIUM CHLORIDE 0.9 % IV BOLUS (SEPSIS)
500.0000 mL | Freq: Once | INTRAVENOUS | Status: AC
  Administered 2015-10-18: 500 mL via INTRAVENOUS

## 2015-10-18 MED ORDER — NAFCILLIN SODIUM 2 G IJ SOLR
2.0000 g | INTRAVENOUS | Status: DC
  Administered 2015-10-18 – 2015-10-26 (×49): 2 g via INTRAVENOUS
  Filled 2015-10-18 (×54): qty 2000

## 2015-10-18 MED ORDER — FENTANYL 50 MCG/HR TD PT72
100.0000 ug | MEDICATED_PATCH | TRANSDERMAL | Status: DC
Start: 2015-10-18 — End: 2015-10-24
  Administered 2015-10-18 – 2015-10-21 (×2): 100 ug via TRANSDERMAL
  Filled 2015-10-18 (×2): qty 2

## 2015-10-18 NOTE — Progress Notes (Signed)
Called about patient having new rash in her groin with vaginal discharge. Also with vaginal discharge.  Exam- small clear vesicles- ~0.2 by 0.2cm present in pubic region just superior to vulva cleft. No surrounding or underlying erythema, not in dermatomal distribution.  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  - Discharge most likely Vaginal candidiasis- considering she has been on prolonged course of antibiotics   Plan- Will treat with a course of Acyclovir- 400mg  Q8H for 7 days. - Vaginal Cand- Will treat empirically with topical Clotrimazole- as pt has prolonged Qtc, will not use azoles which lasts longer in the body, also seroquel dose was just increased today, which also prolongs Qtc.  - Clotrimazole- 3% applicator daily for 3 days. Minimal (3-10%) absorption. Intermittent Qtc checks.  Cassandra SallesEjiro Westly Hinnant, MD. Danise EdgeIMTS. 5:00 PM 10/18/2015
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
## 2015-10-18 NOTE — Progress Notes (Signed)
PULMONARY / CRITICAL CARE MEDICINE   Name: Ayala Ribble MRN: [id number] DOB: 03-May-1981    ADMISSION DATE:  10/04/2015 CONSULTATION DATE:  10/06/15  REFERRING MD:  Patel,P  CHIEF COMPLAINT: Hypertensive urgency related to opioid withdrawl  HISTORY OF PRESENT ILLNESS:  Cassandra Wall is a 35 year old female with past medical history significant for polysubstance abuse(iv heroine and cocaine), tobacco abuse who presented to the ED with complaints of left sided weakness which was going on for two days prior to the admission.  CT of the brain was concerning for right sided infarct She was evaluated by neurology and they think it is septic emboli from IVDU. She has track marks all over her body. From her note it appears that her brother passed away with endocarditis due to IVDU. Rt side endocarditis.  SUBJECTIVE:  Moving right upper extremity. Trying to communicate with gestures. Continues to spike fevers Received NS bolus overnight for hypotension abx changed to nafcillin 4/10  VITAL SIGNS: BP 98/64 mmHg  Pulse 89  Temp(Src) 99.6 F (37.6 C) (Rectal)  Resp 14  Ht  (1.727 m)  Wt 147 lb 7.8 oz (66.9 kg)  BMI 22.43 kg/m2  SpO2 100%  LMP  (LMP Unknown)  HEMODYNAMICS:   VENTILATOR SETTINGS: Vent Mode:  [-] PRVC FiO2 (%):  [40 %] 40 % Set Rate:  [14 bmp] 14 bmp Vt Set:  [490 mL] 490 mL PEEP:  [5 cmH20] 5 cmH20 Plateau Pressure:  [17 cmH20-31 cmH20] 18 cmH20  INTAKE / OUTPUT: I/O last 3 completed shifts: In: 4110.5 [I.V.:1100.5; NG/GT:2310; IV Piggyback:700] Out: 4350 [Urine:4350]  PHYSICAL EXAMINATION: General:  Awake, following commands, eyes open spontaneously Neuro: Moving all extremities- R > L, nods her head in answer to questions HEENT:  Atraumatic, normocephalic, Tracheostomy in place- connected to vent Cardiovascular: regular. [place]- systolic present, tricuspid area. Lungs:  Equal, rhonchi. Abdomen:  Not Tender Musculoskeletal:  Moving all  extremities, R >L Skin: track marks all over the body, SCDs present  LABS:  BMET  Recent Labs Lab 10/16/15 2308 10/17/15 1222 10/17/15 2330  NA 140 139 141  K 3.8 3.9 3.7  CL 98* 97* 103  CO2 34* 33* 33*  BUN CREATININE 0.42* 0.38* 0.37*  GLUCOSE 106* 117* 110*    Electrolytes  Recent Labs Lab 10/16/15 0405  10/16/15 2308 10/17/15 0405 10/17/15 1222 10/17/15 2330 10/18/15 0444  CALCIUM  --   < > 8.3*  --  8.2* 8.2*  --   MG  --   < > 1.9  --  1.8 2.0  --   PHOS 3.5  --   --  2.9  --   --  3.2  < > = values in this interval not displayed.  CBC  Recent Labs Lab 10/16/15 0405 10/17/15 0405 10/18/15 0444  WBC 12.8* 6.8 6.0  HGB 8.7* 8.3* 7.7*  HCT 28.6* 27.7* 25.3*  PLT 272 260 240    Coag's  Recent Labs Lab 10/11/15 1138 10/13/15 1209  APTT 31 34  INR 1.35 1.33    Sepsis Markers No results for input(s): LATICACIDVEN, PROCALCITON, O2SATVEN in the last 168 hours.  ABG No results for input(s): PHART, PCO2ART, PO2ART in the last 168 hours.  Liver Enzymes No results for input(s): AST, ALT, ALKPHOS, BILITOT, ALBUMIN in the last 168 hours.  Cardiac Enzymes No results for input(s): TROPONINI, PROBNP in the last 168 hours.  Glucose  Recent Labs Lab 10/17/15 0837 10/17/15 1158 10/17/15 1539  10/17/15 2003 10/17/15 2346 10/18/15 0330  GLUCAP 89 101* 116* 116* 105* 114*    Imaging No results found.Chest Xray- 4/11- remarkably worse on the right today   STUDIES:  4/5 Chest CT W Contrast- Cavitary lesions- largest- on left - 31mm and on right- 36mm, bilat pleural effusions. 4/5 Head Ct with Contrast- Evolving, large subacute right MCA territory infarct with unchanged mass effect. 2. Small occipital infarcts better demonstrated on MRI.  4/1 MRI brain w Wo contrast- Large acute right MCA infarct, without hemorrhagic conversion or propagation. Evolving small bilateral occipital lobe infarcts. MRA HEAD: Mildly motion degraded examination.  Acute RIGHT distal M1 occlusion consistent with embolic phenomena. 3/29 US renal . No hydronephrosis. 2. Increased parenchymal echogenicity bilaterally compatible with chronic medical renal disease. 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 No significant right-to-left shift or hemorrhagic transformation.BILATERAL occipital lobe hypodensities, subcentimeter size, could represent brain abscesses (versus acute subcortical infarcts). Consider continued surveillance with post infusion MRI. 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 EF of 50-55%  CULTURES: 3/28 BC > mssa 3/28 CSF-normal 3/30>>>  Staph aureus-MSSA again 4/3  B/c>>> FINAL No growth. 4/4 tracheal Cultures >>> Moraxella Final.  ANTIBIOTICS: Naficillin 3/30>>> 4/6 Meropenem 4/6 >> 4/10 Resumed NAfcillin 4/10 >>  SIGNIFICANT EVENTS: 3/28 admitted 3/30 intubated. PCCM consult 4/4- Picc inserted, Central line removed. 4/6 Antibiotics changed from Nafcillin to Meropenem to broaden coverage to include Gram Neg and Anaerobes 4/6 Tracheostomy 4/10- Meropenem d/c, narrowed back to Nafcillin  LINES/TUBES: 3/30 ETT>>> 4/6 3/30 Central line >>>out 4/4 Picc 4/4 >>  Tracheostomy 4/6 >>  DISCUSSION: 35 year old female with h/o polysubstance abuse now presents with right sided septic emboli ddue to IVDU. Major issues are debilitation s/p CVA, agitation, NAG metabolic acidosis.This has improved. We will cont supportive care. Off precedex. Still spiking fevers. Head and chest Ct chest 4/5- evolving large infarct and chest Ct with bilat. effusions. Meropenem 5 day course given, and then narrowed back to Nafcillin for MSSA.  ASSESSMENT / PLAN:  PULMONARY A: Acute Hypoxemic respiratory failure 2/2 > Multifocal PNA due to septic emboli, with cavitary lesions Worsening airspace disease on left  Pulm edema Concern for  G(-) PNA but rpt cultures with Moraxella P:   Back on vent, Trache collar trial when able Reduce diuresis- to IV lasix 20 BID, hold am dose Cont abx- now narrowed to nafcillin Chest xray- Worsening Rt airspace dx- likely consolidation Vs Pleural effusion Possible Ultrasound right chest today.  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 Monitor Cr  Q12H Bmet and MAg Goal K- >4, Mag >2.  GASTROINTESTINAL A:   ?mesentric ischemia possibly due to IVDU Anemia likely from sepsis P:   tolerating feeds pepcid BID for PUD > possible gastritis.  FOBT positive, no gross GI bleed NG tube  HEMATOLOGIC A:   Anemia, possible GIB/gastritis and sepsis- 7.7- 4/11 Thrombocytopenia r/t sepsis - Resolved  P:  scds for DVT  prophylaxis Heparin sq held 2/2 anemia, Observe Hb and Hct.  Goal Hct >7 transfuse if less  INFECTIOUS A:   MSSA endocarditis with septic emboli. Now with Moraxella in sputum (HCAP). Concern for G(-) PNA as well.  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  Encephalopathy  Heroine withdrawal  Intermittent Agitation Large right MCA infarct P:   RASS  goal: -1 - 0 Wean off precedex drip. Wean off fentanyl. Increase seroquel dose, QTC 447- 4/11 on EKG,  Cont clonopin  FAMILY  - Updates: no family at bedside.  Inocente SallesEjiro Quintin Hjort, MD PGY-3, IMTS 8:18 AM 10/18/2015

## 2015-10-18 NOTE — Progress Notes (Signed)
Nutrition Follow-up  DOCUMENTATION CODES:   Underweight  INTERVENTION:    Continue Vital AF 1.2 via OGT at goal rate of 60 ml/h (1440 ml/day) to provide 1728 kcals, 108 gm protein, 1168 ml free water daily  NUTRITION DIAGNOSIS:   Inadequate oral intake related to inability to eat as evidenced by NPO status.  Ongoing  GOAL:   Patient will meet greater than or equal to 90% of their needs  Met  MONITOR:   TF tolerance, Labs, Vent status, Weight trends  ASSESSMENT:   35 y.o. Female with h/o seizures, polysubstance abuse (IV heroin and cocaine) and tobacco abuse who presented to ED with L side weakness. Mother endorsed pt has had psychosis in past. 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 Additional foci of acute nonhemorrhagic infarct involves R occipital lobe, L occipital parietal lobe, in L cerebellum. CXR 3/28 patchy bilateral airspace opacities raise concern for multifocal pneumonia  Discussed patient in ICU rounds and with RN today.  Patient is currently receiving Vital AF 1.2 via OGT at 60 ml/h (1440 ml/day) to provide 1728 kcals, 108 gm protein, 1168 ml free water daily.  Labs and medications reviewed. Patient remains on ventilator support. MV: 12.2 L/min Temp (24hrs), Avg:100 F (37.8 C), Min:97.6 F (36.4 C), Max:103 F (39.4 C)   Diet Order:  Diet NPO time specified  Skin:  Wound (see comment) (DTI to L & R heels)  Last BM:  4/8  Height:   Ht Readings from Last 1 Encounters:  10/04/15 _0  (1.727 m)    Weight:   Wt Readings from Last 1 Encounters:  10/18/15 147 lb 7.8 oz (66.9 kg)    Ideal Body Weight:  63.6 kg  BMI:  Body mass index is 22.43 kg/(m^2).  Estimated Nutritional Needs:   Kcal:  1750  Protein:  80-100 gm  Fluid:  1.7-2 L  EDUCATION NEEDS:   No education needs identified at this time  Molli Barrows, [place], [place], [place] Pager [phone] After Hours Pager [phone]

## 2015-10-19 ENCOUNTER — Inpatient Hospital Stay (HOSPITAL_COMMUNITY): Payer: Medicaid Other

## 2015-10-19 DIAGNOSIS — J90 Pleural effusion, not elsewhere classified: Secondary | ICD-10-CM | POA: Insufficient documentation

## 2015-10-19 LAB — BASIC METABOLIC PANEL
ANION GAP: 8 (ref 5–15)
Anion gap: 8 (ref 5–15)
BUN: 10 mg/dL (ref 6–20)
BUN: 9 mg/dL (ref 6–20)
CHLORIDE: 98 mmol/L — AB (ref 101–111)
CO2: 28 mmol/L (ref 22–32)
CO2: 27 mmol/L (ref 22–32)
Calcium: 7.9 mg/dL — ABNORMAL LOW (ref 8.9–10.3)
Calcium: 7.7 mg/dL — ABNORMAL LOW (ref 8.9–10.3)
Chloride: 101 mmol/L (ref 101–111)
Creatinine, Ser: 0.37 mg/dL — ABNORMAL LOW (ref 0.44–1.00)
Creatinine, Ser: 0.37 mg/dL — ABNORMAL LOW (ref 0.44–1.00)
GFR calc Af Amer: >60 mL/min (ref >60)
GFR calc Af Amer: >60 mL/min (ref >60)
GFR calc non Af Amer: >60 mL/min (ref >60)
GFR calc non Af Amer: >60 mL/min (ref >60)
GLUCOSE: 97 mg/dL (ref 65–99)
GLUCOSE: 271 mg/dL — AB (ref 65–99)
POTASSIUM: 3.4 mmol/L — AB (ref 3.5–5.1)
POTASSIUM: 4 mmol/L (ref 3.5–5.1)
Sodium: 137 mmol/L (ref 135–145)
Sodium: 133 mmol/L — ABNORMAL LOW (ref 135–145)

## 2015-10-19 LAB — LACTATE DEHYDROGENASE, PLEURAL OR PERITONEAL FLUID: LD FL: 232 U/L — AB (ref 3–23)

## 2015-10-19 LAB — HIV-1 RNA ULTRAQUANT REFLEX TO GENTYP+: HIV-1 RNA QUANT, LOG: UNDETERMINED {Log_copies}/mL

## 2015-10-19 LAB — BODY FLUID CELL COUNT WITH DIFFERENTIAL
EOS FL: 0 %
Lymphs, Fluid: 17 %
MONOCYTE-MACROPHAGE-SEROUS FLUID: 9 % — AB (ref 50–90)
NEUTROPHIL FLUID: 74 % — AB (ref 0–25)
WBC FLUID: 1741 uL — AB (ref 0–1000)

## 2015-10-19 LAB — C DIFFICILE QUICK SCREEN W PCR REFLEX
C Diff antigen: NEGATIVE
C Diff interpretation: NEGATIVE
C Diff toxin: NEGATIVE

## 2015-10-19 LAB — CBC
HEMATOCRIT: 23.6 % — AB (ref 36.0–46.0)
HEMOGLOBIN: 7 g/dL — AB (ref 12.0–15.0)
MCH: 26.2 pg (ref 26.0–34.0)
MCHC: 29.7 g/dL — AB (ref 30.0–36.0)
MCV: 88.4 fL (ref 78.0–100.0)
Platelets: 223 10*3/uL (ref 150–400)
RBC: 2.67 MIL/uL — ABNORMAL LOW (ref 3.87–5.11)
RDW: 15.7 % — ABNORMAL HIGH (ref 11.5–15.5)
WBC: 4.9 10*3/uL (ref 4.0–10.5)

## 2015-10-19 LAB — PROTEIN, BODY FLUID: TOTAL PROTEIN, FLUID: 3.6 g/dL

## 2015-10-19 LAB — GLUCOSE, CAPILLARY
GLUCOSE-CAPILLARY: 95 mg/dL (ref 65–99)
GLUCOSE-CAPILLARY: 98 mg/dL (ref 65–99)
Glucose-Capillary: 100 mg/dL — ABNORMAL HIGH (ref 65–99)
Glucose-Capillary: 86 mg/dL (ref 65–99)
Glucose-Capillary: 132 mg/dL — ABNORMAL HIGH (ref 65–99)
Glucose-Capillary: 99 mg/dL (ref 65–99)

## 2015-10-19 LAB — URINE CULTURE: Culture: >=100000 — AB

## 2015-10-19 LAB — PROTEIN, TOTAL: Total Protein: 5.8 g/dL — ABNORMAL LOW (ref 6.5–8.1)

## 2015-10-19 LAB — GRAM STAIN

## 2015-10-19 LAB — MAGNESIUM: Magnesium: 2 mg/dL (ref 1.7–2.4)

## 2015-10-19 LAB — LACTATE DEHYDROGENASE: LDH: 148 U/L (ref 98–192)

## 2015-10-19 LAB — PHOSPHORUS: Phosphorus: 2.9 mg/dL (ref 2.5–4.6)

## 2015-10-19 MED ORDER — LORAZEPAM 2 MG/ML IJ SOLN
2.0000 mg | Freq: Once | INTRAMUSCULAR | Status: AC
  Administered 2015-10-19: 2 mg via INTRAVENOUS

## 2015-10-19 MED ORDER — POTASSIUM CHLORIDE 20 MEQ/15ML (10%) PO SOLN
40.0000 meq | ORAL | Status: AC
  Administered 2015-10-19 (×2): 40 meq via ORAL
  Filled 2015-10-19 (×2): qty 30

## 2015-10-19 MED ORDER — MIDAZOLAM HCL 2 MG/2ML IJ SOLN
4.0000 mg | Freq: Once | INTRAMUSCULAR | Status: AC
  Administered 2015-10-19: 4 mg via INTRAVENOUS

## 2015-10-19 MED ORDER — MAGNESIUM SULFATE 2 GM/50ML IV SOLN
2.0000 g | Freq: Once | INTRAVENOUS | Status: AC
  Administered 2015-10-19: 2 g via INTRAVENOUS
  Filled 2015-10-19: qty 50

## 2015-10-19 MED ORDER — MIDAZOLAM HCL 2 MG/2ML IJ SOLN
INTRAMUSCULAR | Status: AC
  Filled 2015-10-19: qty 4

## 2015-10-19 MED ORDER — MIDAZOLAM HCL 2 MG/2ML IJ SOLN
1.0000 mg | INTRAMUSCULAR | Status: AC | PRN
  Administered 2015-10-19: 2 mg via INTRAVENOUS

## 2015-10-19 MED ORDER — LORAZEPAM 2 MG/ML IJ SOLN
INTRAMUSCULAR | Status: AC
  Filled 2015-10-19: qty 1

## 2015-10-19 MED ORDER — ROCURONIUM BROMIDE 50 MG/5ML IV SOLN
50.0000 mg | Freq: Once | INTRAVENOUS | Status: AC
  Administered 2015-10-19: 50 mg via INTRAVENOUS
  Filled 2015-10-19 (×2): qty 5

## 2015-10-19 MED ORDER — ALTEPLASE 2 MG IJ SOLR
2.0000 mg | Freq: Once | INTRAMUSCULAR | Status: AC
  Administered 2015-10-19: 2 mg
  Filled 2015-10-19: qty 2

## 2015-10-19 MED ORDER — DEXTROSE 5 % IV SOLN
30.0000 ug/min | INTRAVENOUS | Status: DC
  Administered 2015-10-19: 100 ug/min via INTRAVENOUS
  Administered 2015-10-19: 25 ug/min via INTRAVENOUS
  Administered 2015-10-20: 30 ug/min via INTRAVENOUS
  Filled 2015-10-19 (×4): qty 1

## 2015-10-19 NOTE — NC FL2 (Signed)
[place] LEVEL OF CARE SCREENING TOOL     IDENTIFICATION  Patient Name: Cassandra Wall Birthdate: February 05, 1981 Sex: female Admission Date (Current Location): 10/04/2015  [facility] and [place] Number:  Herbalist and Address:  [facility] [place], [place] [address] [place], [place] [address]      Provider Number: [id number]  Attending Physician Name and Address:  Raylene Miyamoto, MD  Relative Name and Phone Number:       Current Level of Care: Hospital Recommended Level of Care: Vent SNF Prior Approval Number:    Date Approved/Denied:   PASRR Number:    Discharge Plan: SNF    Current Diagnoses: Patient Active Problem List   Diagnosis Date Noted  . Pleural effusion   . Encounter for feeding tube placement   . Tracheostomy care ([place])   . Brainstem infarct, acute ([place])   . Moraxella catarrhalis pneumonia ([place])   . Acute respiratory failure ()   . Encounter for orogastric (OG) tube placement   . PICC (peripherally inserted central catheter) in place   . Brain abscess   . PNA (pneumonia)   . Septic shock ([place])   . Staphylococcus aureus bacteremia with sepsis ([place])   . AKI (acute kidney injury) ([place])   . Pressure ulcer 10/06/2015  . Acute respiratory failure with hypoxia ([place])   . Altered mental status   . Endotracheally intubated   . Chest pain 10/05/2015  . Multifocal Pneumonia, due to septic emboli 10/05/2015  . Anemia 10/05/2015  . Stroke (cerebrum) ([place])   . Endocarditis 10/04/2015  . Sepsis ([place]) secondary to endocarditis in an IV drug abuser 10/04/2015  . Acute renal failure (ARF) ([place]) 10/04/2015  . Thrombocytopenia ([place]) 10/04/2015  . Septic embolism ([place]) 10/04/2015  . Acute embolic stroke ([place]) 25/95/6387  . Prolonged Q-T interval on ECG 10/04/2015  . Right forearm cellulitis 11/04/2014  . IV drug abuse 11/04/2014  . Transaminitis 11/04/2014  . Normocytic anemia 11/04/2014  . Endometriosis 02/02/2011   . 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    Orientation RESPIRATION BLADDER Height & Weight     Self, Situation, Place  Vent, Tracheostomy (trach- 65m cuffed; Vent 40% FiO2) Indwelling catheter Weight: 142 lb 3.2 oz (64.5 kg) Height:  _0  (172.7 cm)  BEHAVIORAL SYMPTOMS/MOOD NEUROLOGICAL BOWEL NUTRITION STATUS      Incontinent Feeding tube  AMBULATORY STATUS COMMUNICATION OF NEEDS Skin   Total Care Non-Verbally (Patient is intubated) Skin abrasions (She has track marks all over her body; Multiple abrasions, bruises, lesions concerning for septic emboli.)                       Personal Care Assistance Level of Assistance  Total care, Bathing, Feeding, Dressing Bathing Assistance: Maximum assistance Feeding assistance: Maximum assistance Dressing Assistance: Maximum assistance Total Care Assistance: Maximum assistance   Functional Limitations Info  Sight, Hearing, Speech Sight Info: Adequate Hearing Info: Adequate Speech Info: Impaired (Patient is intubated)    SPECIAL CARE FACTORS FREQUENCY                       Contractures Contractures Info: Not present    Additional Factors Info  Code Status, Allergies, Psychotropic, Isolation Precautions Code Status Info: FULL Allergies Info: Wheat; Ciprofloxacin HCL; Latex Psychotropic Info: Klonopin; Seroquel    Isolation Precautions Info: MRSA     Current Medications (10/19/2015):  This is the current hospital active medication list Current  Facility-Administered Medications  Medication Dose Route Frequency Provider Last Rate Last Dose  . 0.9 %  sodium chloride infusion   Intravenous Continuous Raylene Miyamoto, MD   Stopped at 10/08/15 0000  . acetaminophen (TYLENOL) solution 650 mg  650 mg Oral Q6H PRN Chesley Mires, MD   650 mg at 10/18/15 1226  . acyclovir (ZOVIRAX) 200 MG/5ML suspension SUSP 400 mg  400 mg Oral 3 times per day Bethena Roys, MD   400 mg at 10/19/15 1348  .  antiseptic oral rinse solution (CORINZ)  7 mL Mouth Rinse QID Corey Harold, NP   7 mL at 10/19/15 1349  . chlorhexidine gluconate (SAGE KIT) (PERIDEX) 0.12 % solution 15 mL  15 mL Mouth Rinse BID Corey Harold, NP   15 mL at 10/19/15 0804  . clonazePAM (KLONOPIN) tablet 2 mg  2 mg Per Tube TID Collene Gobble, MD   2 mg at 10/19/15 0948  . clotrimazole (GYNE-LOTRIMIN 3) 2 % vaginal cream 1 Applicatorful  1 Applicatorful Vaginal QHS Ejiroghene Arlyce Dice, MD   1 Applicatorful at 84/66/59 2229  . dexmedetomidine (PRECEDEX) 400 MCG/100ML (4 mcg/mL) infusion  0.4-2.8 mcg/kg/hr Intravenous Continuous Collene Gobble, MD 18.1 mL/hr at 10/19/15 1146 1 mcg/kg/hr at 10/19/15 1146  . docusate (COLACE) 50 MG/5ML liquid 100 mg  100 mg Per Tube BID PRN Chesley Mires, MD      . etomidate (AMIDATE) injection 40 mg  40 mg Intravenous Once Raylene Miyamoto, MD   40 mg at 10/13/15 1115  . feeding supplement (VITAL AF 1.2 CAL) liquid 1,000 mL  1,000 mL Per Tube Continuous [place], MD 60 mL/hr at 10/19/15 1023 1,000 mL at 10/19/15 1023  . fentaNYL (DURAGESIC - dosed mcg/hr) 100 mcg  100 mcg Transdermal Q72H Collene Gobble, MD   100 mcg at 10/18/15 1318  . fentaNYL (SUBLIMAZE) 5000 mcg / 100 mL (50 mcg/mL) infusion  100-400 mcg/hr Intravenous Continuous Jose Shirl Harris, MD 6 mL/hr at 10/19/15 1109 300 mcg/hr at 10/19/15 1109  . fentaNYL (SUBLIMAZE) injection 200 mcg  200 mcg Intravenous Once Raylene Miyamoto, MD   200 mcg at 10/13/15 1115  . fentaNYL (SUBLIMAZE) injection 25-100 mcg  25-100 mcg Intravenous Q2H PRN Raylene Miyamoto, MD   100 mcg at 10/19/15 1246  . furosemide (LASIX) injection 20 mg  20 mg Intravenous BID Bethena Roys, MD   20 mg at 10/19/15 0954  . midazolam (VERSED) 2 MG/2ML injection        2 mg at 10/19/15 1315  . nafcillin 2 g in dextrose 5 % 100 mL IVPB  2 g Intravenous 6 times per day Norva Riffle, RPH   2 g at 10/19/15 1300  . ondansetron (ZOFRAN) injection 4 mg   4 mg Intravenous Q6H PRN Christina P Rama, MD      . pantoprazole (PROTONIX) injection 40 mg  40 mg Intravenous Q12H Jose Angelo Radford Pax, MD   40 mg at 10/19/15 0948  . phenylephrine (NEO-SYNEPHRINE) 10 mg in dextrose 5 % 250 mL (0.04 mg/mL) infusion  30-200 mcg/min Intravenous Continuous Corey Harold, NP 75 mL/hr at 10/19/15 1412 50 mcg/min at 10/19/15 1412  . QUEtiapine (SEROQUEL) tablet 100 mg  100 mg Oral BID Collene Gobble, MD   100 mg at 10/19/15 0947  . sodium chloride flush (NS) 0.9 % injection 10-40 mL  10-40 mL Intracatheter Q12H Raylene Miyamoto, MD  10 mL at 10/19/15 0949  . sodium chloride flush (NS) 0.9 % injection 10-40 mL  10-40 mL Intracatheter PRN Raylene Miyamoto, MD         Discharge Medications: Please see discharge summary for a list of discharge medications.  Relevant Imaging Results:  Relevant Lab Results:   Additional Information SS [phone]  Judeth Horn, LCSW

## 2015-10-19 NOTE — Clinical Social Work Note (Signed)
Clinical Social Work Assessment  Patient Details  Name: Cassandra Wall MRN: [id number] Date of Birth: [birth year]  Date of referral:  10/19/15               Reason for consult:  Facility Placement                Permission sought to share information with:  Facility Medical sales representativeContact Representative, Family Supports Permission granted to share information::  Yes, Verbal Permission Granted  Name::      Susann Givens(Lori Courser (mother) [phone]; Melinda CrutchJames Brown (husband) [phone])  Agency::   ([facility])  Relationship::     Contact Information:     Housing/Transportation Living arrangements for the past 2 months:  Single Family Home Source of Information:  Parent Patient Interpreter Needed:  None Criminal Activity/Legal Involvement Pertinent to Current Situation/Hospitalization:  No - Comment as needed Significant Relationships:  Parents, Spouse Lives with:  Spouse Do you feel safe going back to the place where you live?  No Need for family participation in patient care:  Yes (Comment)  Care giving concerns:  Patient is currently intubated.    Social Worker assessment / plan:  Patient is a 35 YO Caucasian female with a past medical history significant for polysubstance abuse who presents with multi-valvular MSSA endocarditis with associated embolic strokes, embolic pneumonias, suspected mesenteric ischemia. Per MD, based on her menstrual status, inability to tolerate vent weaning, and her progressive chest x-ray, it is suspected that Patient will be ventilator dependent indefinitely. [facility]. Patient is currently opens her eyes to voice, will nod to questions, but is otherwise unable to participate in assessment. CSW contacted Pt's mother for collateral information. CSW discussed with mother that with Patient being vent dependent, her SNF options in [place] are very limited and we will have to look [place]. CSW explained to Patient's mother [facility]. Patient's mother would like to consider the idea of taking Patient home and providing the care with home health but CSW explained that with Patient having been an IV substance abuser, she would be unable to go home with IV antibiotics per RN Case Manager. Patient's mother would also like to consider [place] family in this area. CSW will continue to move forward with Vent SNF placement referrals.   Employment status:  Unemployed Health and safety inspectornsurance information:  [place]:  Not assessed at this time Information / Referral to community resources:  Skilled Nursing Facility  Patient/Family's Response to care:  Patient's mother appreciative of care received to date.  Patient's mother would also like to consider [place] family in this area. Patient's mother would also like to consider the idea of taking Patient home and providing the care with home health.  Patient/Family's Understanding of and Emotional Response to Diagnosis, Current Treatment, and Prognosis:  Patient's mother able to verbalize a strong understanding of Patient's current diagnosis, treatment, and prognosis. She responds in a positive manner but does verbalize some stress and anxiety.  Emotional Assessment Appearance:  Appears stated age Attitude/Demeanor/Rapport:  Unable to Assess, Intubated (Following Commands or Not Following Commands) Affect (typically observed):  Unable to Assess Orientation:  Oriented to Place, Oriented to Self, Oriented to Situation Alcohol / Substance use:  Illicit Drugs, Tobacco Use Psych involvement (Current and /  or in the community):  No (Comment)  Discharge Needs  Concerns to be addressed:  Discharge Planning Concerns, Substance Abuse Concerns Readmission within the last 30 days:   No Current discharge risk:  Substance Abuse Barriers to Discharge:  Inadequate or no insurance   Rockwell Germany, LCSW 10/19/2015, 9:02 AM

## 2015-10-19 NOTE — Procedures (Signed)
Thoracentesis Procedure Note  Pre-operative Diagnosis: R sided pleural effusion  Post-operative Diagnosis: same  Indications: Difficulty to wean from ventilator in setting of lung and neurologic disease. R sided pleural effusion  Procedure Details  Consent: Informed consent was obtained. Risks of the procedure were discussed including: infection, bleeding, pain, pneumothorax.  Ultrasound used to identify effusion      Under sterile conditions the patient was positioned. Betadine solution and sterile drapes were utilized.  1% plain lidocaine was used to anesthetize the 7th rib space. Fluid was obtained without any difficulties and minimal blood loss.  A dressing was applied to the wound and wound care instructions were provided.   Findings 800 ml of cloudy amber pleural fluid was obtained. A sample was sent to Pathology for cytogenetics, flow, and cell counts, as well as for infection analysis.  Complications:  None; patient tolerated the procedure well.          Condition: stable  Plan A follow up chest x-ray was ordered.  [name], AGACNP-BC [place] Pager [phone] or [phone]  10/19/2015 1:49 PM     I was present for the procedure Levy Pupaobert Byrum, MD, PhD 10/19/2015, [name]
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
## 2015-10-19 NOTE — Progress Notes (Signed)
PULMONARY / CRITICAL CARE MEDICINE   Name: Cassandra Wall MRN: [id number] DOB: 06/01/1981    ADMISSION DATE:  10/04/2015 CONSULTATION DATE:  10/06/15  REFERRING MD:  Patel,P  CHIEF COMPLAINT: Hypertensive urgency related to opioid withdrawl  HISTORY OF PRESENT ILLNESS:  Cassandra Wall is a 35 year old female with past medical history significant for polysubstance abuse(iv heroine and cocaine), tobacco abuse who presented to the ED with complaints of left sided weakness which was going on for two days prior to the admission.  CT of the brain was concerning for right sided infarct She was evaluated by neurology and they think it is septic emboli from IVDU. She has track marks all over her body. From her note it appears that her brother passed away with endocarditis due to IVDU. Rt side endocarditis.  SUBJECTIVE:  Awake and alert, shakes her hand in "so-so" pattern to questions of how she is feeling today.  VITAL SIGNS: BP 91/51 mmHg  Pulse 95  Temp(Src) 100 F (37.8 C) (Rectal)  Resp 14  Ht 5\' 8"  (1.727 m)  Wt 142 lb 3.2 oz (64.5 kg)  BMI 21.63 kg/m2  SpO2 100%  LMP  (LMP Unknown)  HEMODYNAMICS:   VENTILATOR SETTINGS: Vent Mode:  [-] PRVC FiO2 (%):  [40 %] 40 % Set Rate:  [14 bmp] 14 bmp Vt Set:  [490 mL] 490 mL PEEP:  [5 cmH20] 5 cmH20 Plateau Pressure:  [18 cmH20-20 cmH20] 20 cmH20  INTAKE / OUTPUT: I/O last 3 completed shifts: In: 3647.6 [I.V.:987.6; NG/GT:2160; IV Piggyback:500] Out: 2925 [Urine:2925]  PHYSICAL EXAMINATION: General:  Awake, following commands, eyes open spontaneously Neuro: Moving all extremities- appears to understand questions and responds appropriately HEENT:  Atraumatic, normocephalic, Tracheostomy in place- connected to vent Cardiovascular: regular, tachycardiac, 2/6 systolic murmur Lungs:  Coarse bilaterally Abdomen:  Soft, nontender Musculoskeletal:  Moving all extremities Skin: track marks all over the body, SCDs present, ~2cm  area of erythema and swelling on dorsum of right foot  LABS:  BMET  Recent Labs Lab 10/17/15 2330 10/18/15 1222 10/19/15 0345  NA 141 140 137  K 3.7 4.6 3.4*  CL 103 101 101  CO2 33* 28 28  BUN 10 11 10   CREATININE 0.37* 0.48 0.37*  GLUCOSE 110* 99 97    Electrolytes  Recent Labs Lab 10/17/15 0405  10/17/15 2330 10/18/15 0444 10/18/15 1222 10/18/15 2322 10/19/15 0345  CALCIUM  --   < > 8.2*  --  8.3*  --  7.9*  MG  --   < > 2.0  --  1.9 1.8  --   PHOS 2.9  --   --  3.2  --   --  2.9  < > = values in this interval not displayed.  CBC  Recent Labs Lab 10/17/15 0405 10/18/15 0444 10/19/15 0345  WBC 6.8 6.0 4.9  HGB 8.3* 7.7* 7.0*  HCT 27.7* 25.3* 23.6*  PLT 260 240 223    Coag's  Recent Labs Lab 10/13/15 1209  APTT 34  INR 1.33    Sepsis Markers No results for input(s): LATICACIDVEN, PROCALCITON, O2SATVEN in the last 168 hours.  ABG No results for input(s): PHART, PCO2ART, PO2ART in the last 168 hours.  Liver Enzymes No results for input(s): AST, ALT, ALKPHOS, BILITOT, ALBUMIN in the last 168 hours.  Cardiac Enzymes No results for input(s): TROPONINI, PROBNP in the last 168 hours.  Glucose  Recent Labs Lab 10/18/15 0821 10/18/15 1206 10/18/15 1526 10/18/15 2010 10/18/15 2356 10/19/15 0410  GLUCAP 125* 101* 127* 144* 95 98    Imaging Dg Chest Port 1 View  10/18/2015  CLINICAL DATA:  Acute respiratory failure.  Septic emboli EXAM: PORTABLE CHEST 1 VIEW COMPARISON:  October 17, 2015 FINDINGS: The heart size is enlarged. Tracheostomy tube is unchanged. A feeding tube is identified distal tip probably within the fundus of the stomach unchanged. Right central venous line is unchanged. There is pulmonary edema. There is a large right pleural effusion and small to moderate left pleural effusion. Patchy consolidation of bilateral lung bases are noted. The visualized bony structures are unremarkable. IMPRESSION: Pulmonary edema. Patchy  consolidation of bilateral lung bases, although in part due to atelectasis, underlying pneumonia is not excluded. Bilateral pleural effusions. Electronically Signed   By: Sherian Rein M.D.   On: 10/18/2015 10:33  Chest Xray- 4/11- remarkably worse on the right today   STUDIES:  4/11 pCXR: pulmonary vascular congestion, patchy consolidation of bilateral bases and bilateral pleural effusions R>L 4/5 Chest CT W Contrast- Cavitary lesions- largest- on left - 31mm and on right- 36mm, bilat pleural effusions. 4/5 Head Ct with Contrast- Evolving, large subacute right MCA territory infarct with unchanged mass effect. 2. Small occipital infarcts better demonstrated on MRI.  4/1 MRI brain w Wo contrast- Large acute right MCA infarct, without hemorrhagic conversion or propagation. Evolving small bilateral occipital lobe infarcts. MRA HEAD: Mildly motion degraded examination. Acute RIGHT distal M1 occlusion consistent with embolic phenomena. 3/29 US renal . No hydronephrosis. 2. Increased parenchymal echogenicity bilaterally compatible with chronic medical renal disease. 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 No significant right-to-left shift or hemorrhagic transformation.BILATERAL occipital lobe hypodensities, subcentimeter size, could represent brain abscesses (versus acute subcortical infarcts). Consider continued surveillance with post infusion MRI. 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 EF of 50-55%  CULTURES: 3/28 BC > mssa 3/28 CSF-normal 3/30>>>  Staph aureus-MSSA again 4/3  B/c>>> FINAL No growth. 4/4 tracheal Cultures >>> Moraxella Final.  ANTIBIOTICS: Naficillin 3/30>>> 4/6 Meropenem 4/6 >> 4/10 Resumed NAfcillin 4/10 >>  SIGNIFICANT EVENTS: 3/28 admitted 3/30 intubated. PCCM consult 4/4- Picc inserted, Central line removed. 4/6 Antibiotics changed from  Nafcillin to Meropenem to broaden coverage to include Gram Neg and Anaerobes 4/6 Tracheostomy 4/10- Meropenem d/c, narrowed back to Nafcillin  LINES/TUBES: 3/30 ETT>>> 4/6 3/30 Central line >>>out 4/4 Picc 4/4 >>  Tracheostomy 4/6 >>  DISCUSSION: 35 year old female with h/o polysubstance abuse now presents with right sided septic emboli ddue to IVDU. Major issues are debilitation s/p CVA, agitation, NAG metabolic acidosis.This has improved. We will cont supportive care. Off precedex. Still spiking fevers. Head and chest Ct chest 4/5- evolving large infarct and chest Ct with bilat. effusions. Meropenem 5 day course given, and then narrowed back to Nafcillin for MSSA.  ASSESSMENT / PLAN:  PULMONARY A: Acute Hypoxemic respiratory failure 2/2 > Multifocal PNA due to septic emboli, with cavitary lesions Pulm edema>> resolved Concern for G(-) PNA but rpt cultures with Moraxella P:   Back on vent, Trach collar trial vs PSV when able Try to keep fluids even with-  IV lasix 20 BID if further hypotension hold dose. Cont abx- now narrowed to nafcillin Chest xray- Worsening Rt airspace dx- likely consolidation Vs Pleural effusion Ultrasound right chest today, consider diagnostic thoracentesis.  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 Monitor Cr  Q12H Bmet and MAg Goal K- >4, Mag >2.  GASTROINTESTINAL A:   ?mesentric ischemia possibly due to IVDU Anemia likely from sepsis Abdominal Pain Loose stools/diarrrhea P:   tolerating feeds pepcid BID for PUD >  possible gastritis.  FOBT positive, no gross GI bleed NG tube Obtain KUB>> no evidence of abdominal obstruction of large amount of stool burden  HEMATOLOGIC A:   Anemia, possible GIB/gastritis and sepsis- 7.0- 4/12 Thrombocytopenia r/t sepsis - Resolved  P:  scds for DVT prophylaxis Heparin sq held 2/2 anemia Monitor Hb and Hct.  Goal Hct >7 transfuse if less  INFECTIOUS A:   MSSA endocarditis with septic emboli. Now with Moraxella in sputum (HCAP). Concern for G(-) PNA as well.  Suspected cellulitis involving the drug injection site Persistent fevers leukocytosis- resolved Pleural effusions>>? Empyema  Possible small abscess right foot Vulvovaganitis HSV outbreak P:   Appreciate ID recs- narrow back to nafcillin, considering burden of infection, no surprise she is still spiking fevers Clortrimazole x 3 days (avoiding systemic fluconazole due to QTc) Acyclovir x 7 days Diagnostic thoracentesis if enough fluid to sample.    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  Encephalopathy  Heroine withdrawal  Intermittent Agitation Large right MCA infarct P:   RASS goal: -1 - 0 Wean off precedex drip. Wean off fentanyl if able Cont Seroquel Cont clonopin  FAMILY  - Updates: Dr Delton [place]ombes updated mother at bedside 4/12  Gust Rung, DO  PGY-3, IMTS 8:22 AM 10/19/2015

## 2015-10-20 ENCOUNTER — Other Ambulatory Visit: Payer: Self-pay | Admitting: Pulmonary Disease

## 2015-10-20 DIAGNOSIS — I2690 Septic pulmonary embolism without acute cor pulmonale: Secondary | ICD-10-CM | POA: Insufficient documentation

## 2015-10-20 DIAGNOSIS — Z0189 Encounter for other specified special examinations: Secondary | ICD-10-CM | POA: Insufficient documentation

## 2015-10-20 DIAGNOSIS — Z959 Presence of cardiac and vascular implant and graft, unspecified: Secondary | ICD-10-CM

## 2015-10-20 DIAGNOSIS — B3749 Other urogenital candidiasis: Secondary | ICD-10-CM

## 2015-10-20 DIAGNOSIS — J984 Other disorders of lung: Secondary | ICD-10-CM

## 2015-10-20 DIAGNOSIS — L989 Disorder of the skin and subcutaneous tissue, unspecified: Secondary | ICD-10-CM

## 2015-10-20 DIAGNOSIS — Z952 Presence of prosthetic heart valve: Secondary | ICD-10-CM

## 2015-10-20 DIAGNOSIS — B379 Candidiasis, unspecified: Secondary | ICD-10-CM

## 2015-10-20 LAB — BASIC METABOLIC PANEL
ANION GAP: 8 (ref 5–15)
BUN: 10 mg/dL (ref 6–20)
CHLORIDE: 98 mmol/L — AB (ref 101–111)
CO2: 30 mmol/L (ref 22–32)
CREATININE: 0.42 mg/dL — AB (ref 0.44–1.00)
Calcium: 7.9 mg/dL — ABNORMAL LOW (ref 8.9–10.3)
GFR calc non Af Amer: >60 mL/min (ref >60)
Glucose, Bld: 159 mg/dL — ABNORMAL HIGH (ref 65–99)
POTASSIUM: 4.1 mmol/L (ref 3.5–5.1)
SODIUM: 136 mmol/L (ref 135–145)

## 2015-10-20 LAB — CBC
HEMATOCRIT: 22.8 % — AB (ref 36.0–46.0)
HEMOGLOBIN: 7.1 g/dL — AB (ref 12.0–15.0)
MCH: 27.4 pg (ref 26.0–34.0)
MCHC: 31.1 g/dL (ref 30.0–36.0)
MCV: 88 fL (ref 78.0–100.0)
PLATELETS: 219 10*3/uL (ref 150–400)
RBC: 2.59 MIL/uL — AB (ref 3.87–5.11)
RDW: 15.8 % — ABNORMAL HIGH (ref 11.5–15.5)
WBC: 4.9 10*3/uL (ref 4.0–10.5)

## 2015-10-20 LAB — GLUCOSE, CAPILLARY
GLUCOSE-CAPILLARY: 90 mg/dL (ref 65–99)
GLUCOSE-CAPILLARY: 111 mg/dL — AB (ref 65–99)
GLUCOSE-CAPILLARY: 120 mg/dL — AB (ref 65–99)
GLUCOSE-CAPILLARY: 110 mg/dL — AB (ref 65–99)
Glucose-Capillary: 103 mg/dL — ABNORMAL HIGH (ref 65–99)
Glucose-Capillary: 87 mg/dL (ref 65–99)

## 2015-10-20 LAB — CULTURE, RESPIRATORY

## 2015-10-20 LAB — CULTURE, RESPIRATORY W GRAM STAIN

## 2015-10-20 LAB — MAGNESIUM: Magnesium: 1.9 mg/dL (ref 1.7–2.4)

## 2015-10-20 MED ORDER — METHADONE HCL 10 MG PO TABS
20.0000 mg | ORAL_TABLET | Freq: Two times a day (BID) | ORAL | Status: DC
  Administered 2015-10-20 – 2015-10-23 (×7): 20 mg
  Filled 2015-10-20 (×7): qty 2

## 2015-10-20 MED ORDER — METHADONE HCL 10 MG/ML IJ SOLN: 20.0000 mg | Freq: Two times a day (BID) | INTRAMUSCULAR | Status: DC

## 2015-10-20 MED ORDER — MAGNESIUM SULFATE IN D5W 10-5 MG/ML-% IV SOLN
1.0000 g | Freq: Once | INTRAVENOUS | Status: AC
  Administered 2015-10-20: 1 g via INTRAVENOUS
  Filled 2015-10-20: qty 100

## 2015-10-20 MED ORDER — METHADONE HCL 10 MG PO TABS: 20.0000 mg | ORAL_TABLET | Freq: Two times a day (BID) | ORAL | Status: DC

## 2015-10-20 NOTE — Progress Notes (Addendum)
Subjective: Seems to want her mittens removed   Antibiotics:  Anti-infectives    Start     Dose/Rate Route Frequency Ordered Stop   10/18/15 1700  acyclovir (ZOVIRAX) 200 MG/5ML suspension SUSP 400 mg     400 mg Oral 3 times per day 10/18/15 1631     10/18/15 1200  nafcillin 2 g in dextrose 5 % 100 mL IVPB     2 g 200 mL/hr over 30 Minutes Intravenous 6 times per day 10/18/15 1102     10/17/15 1600  nafcillin injection 2 g  Status:  Discontinued     2 g Intravenous Every 4 hours 10/17/15 1413 10/17/15 1520   10/17/15 1500  nafcillin 2 g in dextrose 5 % 100 mL IVPB  Status:  Discontinued     2 g 200 mL/hr over 30 Minutes Intravenous 4 times per day 10/17/15 1520 10/18/15 1102   10/13/15 1400  meropenem (MERREM) 2 g in sodium chloride 0.9 % 100 mL IVPB  Status:  Discontinued     2 g 200 mL/hr over 30 Minutes Intravenous 3 times per day 10/13/15 0946 10/17/15 1426   10/06/15 1200  ceFAZolin (ANCEF) IVPB 2g/100 mL premix  Status:  Discontinued    Comments:  Pharmacy may adjust   2 g 200 mL/hr over 30 Minutes Intravenous 3 times per day 10/06/15 1019 10/06/15 1019   10/06/15 1200  nafcillin 2 g in dextrose 5 % 100 mL IVPB  Status:  Discontinued     2 g 200 mL/hr over 30 Minutes Intravenous 6 times per day 10/06/15 1042 10/13/15 0946   10/06/15 1100  nafcillin injection 2 g  Status:  Discontinued    Comments:  Pharmacy may adjust   2 g Intravenous Every 4 hours 10/06/15 1019 10/06/15 1042   10/05/15 1100  ceFAZolin (ANCEF) IVPB 2g/100 mL premix  Status:  Discontinued    Comments:  Pharmacy may adjust   2 g 200 mL/hr over 30 Minutes Intravenous Every 12 hours 10/05/15 0959 10/06/15 1019   10/05/15 0500  ceFEPIme (MAXIPIME) 2 g in dextrose 5 % 50 mL IVPB  Status:  Discontinued     2 g 100 mL/hr over 30 Minutes Intravenous Every 24 hours 10/04/15 1444 10/05/15 0959   10/04/15 1215  ampicillin (OMNIPEN) 2 g in sodium chloride 0.9 % 50 mL IVPB     2 g 150 mL/hr over 20  Minutes Intravenous NOW 10/04/15 1208 10/04/15 1305   10/04/15 0530  ceFEPIme (MAXIPIME) 2 g in dextrose 5 % 50 mL IVPB     2 g 100 mL/hr over 30 Minutes Intravenous  Once 10/04/15 0529 10/04/15 0612   10/04/15 0445  cefTRIAXone (ROCEPHIN) 2 g in dextrose 5 % 50 mL IVPB     2 g 100 mL/hr over 30 Minutes Intravenous  Once 10/04/15 0441 10/04/15 0529   10/04/15 0445  vancomycin (VANCOCIN) 2,000 mg in sodium chloride 0.9 % 500 mL IVPB     2,000 mg 250 mL/hr over 120 Minutes Intravenous  Once 10/04/15 0441 10/04/15 1234   10/04/15 0445  ampicillin (OMNIPEN) 2 g in sodium chloride 0.9 % 50 mL IVPB  Status:  Discontinued     2 g 150 mL/hr over 20 Minutes Intravenous  Once 10/04/15 0441 10/04/15 0532      Medications: Scheduled Meds: . acyclovir  400 mg Oral 3 times per day  . antiseptic oral rinse  7 mL Mouth Rinse QID  .  chlorhexidine gluconate (SAGE KIT)  15 mL Mouth Rinse BID  . clonazePAM  2 mg Per Tube TID  . clotrimazole  1 Applicatorful Vaginal QHS  . etomidate  40 mg Intravenous Once  . fentaNYL  100 mcg Transdermal Q72H  . fentaNYL (SUBLIMAZE) injection  200 mcg Intravenous Once  . furosemide  20 mg Intravenous BID  . magnesium sulfate 1 - 4 g bolus IVPB  1 g Intravenous Once  . methadone  20 mg Per Tube BID  . nafcillin IV  2 g Intravenous 6 times per day  . pantoprazole (PROTONIX) IV  40 mg Intravenous Q12H  . QUEtiapine  100 mg Oral BID  . sodium chloride flush  10-40 mL Intracatheter Q12H   Continuous Infusions: . sodium chloride Stopped (10/08/15 0000)  . dexmedetomidine 1.2 mcg/kg/hr (10/20/15 0805)  . feeding supplement (VITAL AF 1.2 CAL) 1,000 mL (10/20/15 0113)  . fentaNYL infusion INTRAVENOUS 250 mcg/hr (10/20/15 0800)   PRN Meds:.acetaminophen (TYLENOL) oral liquid 160 mg/5 mL, docusate, fentaNYL (SUBLIMAZE) injection, [DISCONTINUED] ondansetron **OR** ondansetron (ZOFRAN) IV, sodium chloride flush    Objective: Weight change: 7 lb 7.9 oz (3.4  kg)  Intake/Output Summary (Last 24 hours) at 10/20/15 1020 Last data filed at 10/20/15 1000  Gross per 24 hour  Intake 3277.48 ml  Output   4525 ml  Net -1247.52 ml   Blood pressure 112/79, pulse 97, temperature 100.2 F (37.9 C), temperature source Rectal, resp. rate 14, height 5' 8"  (1.727 m), weight 149 lb 11.1 oz (67.9 kg), SpO2 100 %. Temp:  [98.8 F (37.1 C)-102.6 F (39.2 C)] 100.2 F (37.9 C) (04/13 0356) Pulse Rate:  [57-180] 97 (04/13 0815) Resp:  [14-37] 14 (04/13 0815) BP: (78-162)/(32-137) 112/79 mmHg (04/13 0815) SpO2:  [97 %-100 %] 100 % (04/13 0818) FiO2 (%):  [30 %-40 %] 30 % (04/13 0818) Weight:  [149 lb 11.1 oz (67.9 kg)] 149 lb 11.1 oz (67.9 kg) (04/13 0500)  Physical Exam: General:ith tracheostomy, following commands, moving all 4 extremities HEENT: anicteric sclera,  EOMI CVS regular rate, could not hear a murmur Chest:rhonchi throughout Abdomen: soft nontender, nondistended, normal bowel sounds, Extremities: no  clubbing or edema noted bilaterally Skin: Numerous injection sites throughout her body, PICC line in place , IJ out she has a tender area of fluctuance on right foot Neuro: moving all extremities  CBC:  CBC Latest Ref Rng 10/20/2015 10/19/2015 10/18/2015  WBC 4.0 - 10.5 K/uL 4.9 4.9 6.0  Hemoglobin 12.0 - 15.0 g/dL 7.1(L) 7.0(L) 7.7(L)  Hematocrit 36.0 - 46.0 % 22.8(L) 23.6(L) 25.3(L)  Platelets 150 - 400 K/uL 219 223 240       BMET  Recent Labs  10/19/15 1816 10/20/15 0520  NA 133* 136  K 4.0 4.1  CL 98* 98*  CO2 27 30  GLUCOSE 271* 159*  BUN 9 10  CREATININE 0.37* 0.42*  CALCIUM 7.7* 7.9*     Liver Panel   Recent Labs  10/19/15 1816  PROT 5.8*       Sedimentation Rate No results for input(s): ESRSEDRATE in the last 72 hours. C-Reactive Protein No results for input(s): CRP in the last 72 hours.  Micro Results: Recent Results (from the past 720 hour(s))  Culture, blood (single)     Status: None   Collection  Time: 10/04/15  4:46 AM  Result Value Ref Range Status   Specimen Description BLOOD LEFT HIP  Final   Special Requests IN PEDIATRIC BOTTLE 5CC  Final   Culture  Setup Time   Final    GRAM POSITIVE COCCI IN CLUSTERS AEROBIC BOTTLE ONLY CRITICAL RESULT CALLED TO, READ BACK BY AND VERIFIED WITHOneta Rack IE [id number] 10/04/15 A BROWNING    Culture   Final    STAPHYLOCOCCUS AUREUS Performed at [facility]    Report Status 10/06/2015 FINAL  Final   Organism ID, Bacteria STAPHYLOCOCCUS AUREUS  Final      Susceptibility   Staphylococcus aureus - MIC*    CIPROFLOXACIN <=0.5 SENSITIVE Sensitive     ERYTHROMYCIN <=0.25 SENSITIVE Sensitive     GENTAMICIN <=0.5 SENSITIVE Sensitive     OXACILLIN <=0.25 SENSITIVE Sensitive     TETRACYCLINE <=1 SENSITIVE Sensitive     VANCOMYCIN 1 SENSITIVE Sensitive     TRIMETH/SULFA <=10 SENSITIVE Sensitive     CLINDAMYCIN <=0.25 SENSITIVE Sensitive     RIFAMPIN <=0.5 SENSITIVE Sensitive     Inducible Clindamycin NEGATIVE Sensitive     * STAPHYLOCOCCUS AUREUS  CSF culture     Status: None   Collection Time: 10/04/15  6:00 AM  Result Value Ref Range Status   Specimen Description CSF  Final   Special Requests Normal  Final   Gram Stain   Final    CYTOSPIN WBC PRESENT,BOTH PMN AND MONONUCLEAR NO ORGANISMS SEEN Gram Stain Report Called to,Read Back By and Verified With: A. DENNIS RN AT 0700 ON 03.28.17 BY SHUEA    Culture   Final    NO GROWTH 3 DAYS Performed at [facility]    Report Status 10/07/2015 FINAL  Final  Anaerobic culture     Status: None   Collection Time: 10/04/15  6:00 AM  Result Value Ref Range Status   Specimen Description CSF  Final   Special Requests NONE  Final   Gram Stain   Final    WBC PRESENT,BOTH PMN AND MONONUCLEAR NO ORGANISMS SEEN CYTOSPIN SMEAR    Culture   Final    NO ANAEROBES ISOLATED Performed at [facility]    Report Status 10/09/2015 FINAL  Final  MRSA PCR Screening     Status: None    Collection Time: 10/04/15 10:52 PM  Result Value Ref Range Status   MRSA by PCR NEGATIVE NEGATIVE Final    Comment:        The GeneXpert MRSA Assay (FDA approved for NASAL specimens only), is one component of a comprehensive MRSA colonization surveillance program. It is not intended to diagnose MRSA infection nor to guide or monitor treatment for MRSA infections.   Culture, blood (routine x 2)     Status: None   Collection Time: 10/06/15  8:34 AM  Result Value Ref Range Status   Specimen Description BLOOD RIGHT ANTECUBITAL  Final   Special Requests IN PEDIATRIC BOTTLE 1CC  Final   Culture  Setup Time   Final    GRAM POSITIVE COCCI IN CLUSTERS AEROBIC BOTTLE ONLY CRITICAL RESULT CALLED TO, READ BACK BY AND VERIFIED WITH: S WHITE,RN @0247  10/07/15 MKELLY    Culture   Final    STAPHYLOCOCCUS AUREUS SUSCEPTIBILITIES PERFORMED ON PREVIOUS CULTURE WITHIN THE LAST 5 DAYS.    Report Status 10/08/2015 FINAL  Final  Culture, blood (routine x 2)     Status: None   Collection Time: 10/06/15  8:37 AM  Result Value Ref Range Status   Specimen Description BLOOD LEFT HAND  Final   Special Requests IN PEDIATRIC BOTTLE [facility]  Final   Culture  Setup Time   Final  GRAM POSITIVE COCCI IN CLUSTERS IN PEDIATRIC BOTTLE CRITICAL RESULT CALLED TO, READ BACK BY AND VERIFIED WITH: L WILSON,RN AT 1572 10/07/15 BY L BENFIELD    Culture   Final    STAPHYLOCOCCUS AUREUS SUSCEPTIBILITIES PERFORMED ON PREVIOUS CULTURE WITHIN THE LAST 5 DAYS.    Report Status 10/08/2015 FINAL  Final  Culture, respiratory (NON-Expectorated)     Status: None   Collection Time: 10/08/15  1:17 PM  Result Value Ref Range Status   Specimen Description TRACHEAL ASPIRATE  Final   Special Requests NONE  Final   Gram Stain   Final    ABUNDANT WBC PRESENT, PREDOMINANTLY PMN FEW SQUAMOUS EPITHELIAL CELLS PRESENT ABUNDANT YEAST FEW GRAM POSITIVE COCCI IN PAIRS IN CLUSTERS IN CHAINS THIS SPECIMEN IS ACCEPTABLE FOR SPUTUM  CULTURE Performed at [facility]    Culture   Final    MODERATE CANDIDA ALBICANS Performed at [facility]    Report Status 10/11/2015 FINAL  Final  Culture, blood (Routine X 2) w Reflex to ID Panel     Status: None   Collection Time: 10/10/15  4:04 PM  Result Value Ref Range Status   Specimen Description BLOOD RIGHT FOOT  Final   Special Requests IN PEDIATRIC BOTTLE 0.75CC  Final   Culture NO GROWTH 5 DAYS  Final   Report Status 10/15/2015 FINAL  Final  Culture, blood (Routine X 2) w Reflex to ID Panel     Status: None   Collection Time: 10/10/15  4:10 PM  Result Value Ref Range Status   Specimen Description BLOOD LEFT FOOT  Final   Special Requests IN PEDIATRIC BOTTLE 0.75CC  Final   Culture NO GROWTH 5 DAYS  Final   Report Status 10/15/2015 FINAL  Final  Culture, respiratory (NON-Expectorated)     Status: None   Collection Time: 10/11/15 11:30 AM  Result Value Ref Range Status   Specimen Description TRACHEAL ASPIRATE  Final   Special Requests NONE  Final   Gram Stain   Final    FEW WBC PRESENT, PREDOMINANTLY PMN RARE SQUAMOUS EPITHELIAL CELLS PRESENT FEW GRAM POSITIVE COCCI IN PAIRS RARE GRAM NEGATIVE RODS RARE GRAM NEGATIVE COCCI Performed at [facility]    Culture   Final    MORAXELLA CATARRHALIS(BRANHAMELLA) Note: BETA LACTAMASE NEGATIVE Performed at [facility]    Report Status 10/13/2015 FINAL  Final  Culture, Urine     Status: Abnormal   Collection Time: 10/16/15 11:15 AM  Result Value Ref Range Status   Specimen Description URINE, CATHETERIZED  Final   Special Requests NONE  Final   Culture >=100,000 COLONIES/mL YEAST (A)  Final   Report Status 10/17/2015 FINAL  Final  Culture, blood (routine x 2)     Status: None (Preliminary result)   Collection Time: 10/18/15  1:54 PM  Result Value Ref Range Status   Specimen Description BLOOD LEFT ARM  Final   Special Requests IN PEDIATRIC BOTTLE 2CC  Final   Culture NO GROWTH < 24  HOURS  Final   Report Status PENDING  Incomplete  Culture, respiratory (NON-Expectorated)     Status: None   Collection Time: 10/18/15  2:10 PM  Result Value Ref Range Status   Specimen Description TRACHEAL ASPIRATE  Final   Special Requests NONE  Final   Gram Stain   Final    FEW WBC PRESENT, PREDOMINANTLY PMN RARE SQUAMOUS EPITHELIAL CELLS PRESENT RARE YEAST RARE GRAM POSITIVE COCCI IN PAIRS Performed at [facility]  Culture   Final    FEW YEAST CONSISTENT WITH CANDIDA SPECIES Performed at [facility]    Report Status 10/20/2015 FINAL  Final  Urine culture     Status: Abnormal   Collection Time: 10/18/15  2:35 PM  Result Value Ref Range Status   Specimen Description URINE, RANDOM  Final   Special Requests NONE  Final   Culture >=100,000 COLONIES/mL YEAST (A)  Final   Report Status 10/19/2015 FINAL  Final  Culture, blood (routine x 2)     Status: None (Preliminary result)   Collection Time: 10/18/15  5:22 PM  Result Value Ref Range Status   Specimen Description BLOOD LEFT ANTECUBITAL  Final   Special Requests IN PEDIATRIC BOTTLE 1CC  Final   Culture NO GROWTH < 24 HOURS  Final   Report Status PENDING  Incomplete  C difficile quick scan w PCR reflex     Status: None   Collection Time: 10/19/15 10:25 AM  Result Value Ref Range Status   C Diff antigen NEGATIVE NEGATIVE Final   C Diff toxin NEGATIVE NEGATIVE Final   C Diff interpretation Negative for toxigenic C. difficile  Final  Gram stain     Status: None   Collection Time: 10/19/15  1:53 PM  Result Value Ref Range Status   Specimen Description FLUID RIGHT PLEURAL  Final   Special Requests NONE  Final   Gram Stain   Final    MODERATE WBC PRESENT,BOTH PMN AND MONONUCLEAR NO ORGANISMS SEEN    Report Status 10/19/2015 FINAL  Final    Studies/Results: Dg Chest Port 1 View  10/19/2015  CLINICAL DATA:  35 year old female status right side thoracentesis at the bedside. Initial encounter. EXAM:  PORTABLE CHEST 1 VIEW COMPARISON:  1147 Hours today. FINDINGS: Portable AP semi upright view at 1409 hours. Decreased right pleural effusion. No pneumothorax. Improved right lung base ventilation with residual streaky opacity. Incidental nipple shadow also noted. Stable tracheostomy tube, right PICC line, and visualized enteric feeding tube. Allowing for portable technique the left lung is clear. Normal cardiac size and mediastinal contours. IMPRESSION: Decreased right pleural effusion and no pneumothorax following right side thoracentesis. Electronically Signed   By: Genevie Ann M.D.   On: 10/19/2015 14:34   Dg Chest Port 1 View  10/19/2015  CLINICAL DATA:  Intubated EXAM: PORTABLE CHEST 1 VIEW COMPARISON:  10/18/2015 FINDINGS: Cardiomediastinal silhouette is stable. There is small right pleural effusion. Hazy atelectasis or infiltrate in right lower lobe. Stable tracheostomy tube position. NG feeding tube coiled within stomach with tip in proximal stomach. Stable right arm PICC line position with tip in SVC. No pulmonary edema. IMPRESSION: There is small right pleural effusion. Hazy atelectasis or infiltrate in right lower lobe. Stable tracheostomy tube position. NG feeding tube coiled within stomach with tip in proximal stomach. Stable right arm PICC line position with tip in SVC. No pulmonary edema. Electronically Signed   By: Lahoma Crocker M.D.   On: 10/19/2015 12:41   Dg Abd Portable 1v  10/19/2015  CLINICAL DATA:  Abdominal pain EXAM: PORTABLE ABDOMEN - 1 VIEW COMPARISON:  None. FINDINGS: Limited rotated abdominal radiograph, best obtainable due to patient condition. Feeding tube is looped over the stomach, tip not visualized but present at the gastric fundus based on chest x-ray from earlier today. Nonobstructive bowel gas pattern. No evidence of intra-abdominal mass effect or calcification. IMPRESSION: Limited study with normal bowel gas pattern. Electronically Signed   By: Neva Seat.D.  On:  10/19/2015 12:47      Assessment/Plan:  INTERVAL HISTORY:   10/10/15: patient with persistent fevers 10/11/15: PICC placed and now IJ removed 4/5 --10/13/15: still febrile, CT shows bilateral cavitating septic emboli areas, bilateral pleural effusions, Tracheal aspirate is growing GNR-- Moraxella  CT head shows Evolving, large subacute right MCA territory infarct with unchanged mass effect.  10/14/15: still febrile 10/15/15--> 10/21/15: sp thoracocentesis with pleural fluid c/w exudate  Principal Problem:   Sepsis ([place]) secondary to endocarditis in an IV drug abuser Active Problems:   IV drug abuse   Acute renal failure (ARF) (HCC)   Thrombocytopenia (HCC)   Septic embolism (HCC)   Acute embolic stroke (HCC)   Prolonged Q-T interval on ECG   Multifocal Pneumonia, due to septic emboli   Anemia   Stroke (cerebrum) (HCC)   Pressure ulcer   Acute respiratory failure with hypoxia (HCC)   Altered mental status   Endotracheally intubated   AKI (acute kidney injury) ([place])   Brain abscess   PNA (pneumonia)   Septic shock (HCC)   Staphylococcus aureus bacteremia with sepsis ([place])   Acute respiratory failure ([place])   Encounter for orogastric (OG) tube placement   PICC (peripherally inserted central catheter) in place   Brainstem infarct, acute ([place])   Moraxella catarrhalis pneumonia ([place])   Encounter for feeding tube placement   Tracheostomy care ([place])   Pleural effusion    Berklie Dethlefs is a 35 y.o. 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 Hopefully she survives to be able to then come to terms with this. She has very high mortality if she cannot completely stop this habit  #7 Chronic HCV without hepatic coma: Can try to cure her when she is outside the hospital. Medicaid would insist that she is drug free  #8 Candida in sputum: of no consequence unless as maker of esophagitis in which case would use either nystatin swish and swallow vs parenteral echinocandin  #9 Funguria: her foley catheter needs to be removed, a new one can be placed IF she needs it  #10 QT prolongation: will need to be very careful with meds  #11 FOot lesion: warm compresses and would have low threshold to do bedside I and D   LOS: 16 days   Rhina Brackett [place] 10/20/2015, 10:20 AM

## 2015-10-20 NOTE — Progress Notes (Signed)
PASRR received on today 10/20/2015   PASRR # is [id number] [id number]  CSW will continue to follow for disposition.     Lance MussAshley Gardner,MSW, LCSW [facility] Clinical Social Worker [phone]

## 2015-10-20 NOTE — Progress Notes (Signed)
PULMONARY / CRITICAL CARE MEDICINE Daily progress note   Name: Cassandra Wall MRN: [id number] DOB: 04-24-1981    ADMISSION DATE:  10/04/2015 CONSULTATION DATE:  10/06/15  REFERRING MD:  Patel,P  CHIEF COMPLAINT: Hypertensive urgency related to opioid withdrawl  HISTORY OF PRESENT ILLNESS:  Cassandra Wall is a 35 year old Cassandra with past medical history significant for polysubstance abuse(iv heroine and cocaine), tobacco abuse who presented to the ED with complaints of left sided weakness which was going on for two days prior to the admission.  CT of the brain was concerning for right sided infarct She was evaluated by neurology and they think it is septic emboli from IVDU. She has track marks all over her body. From her note it appears that her brother passed away with endocarditis due to IVDU. Rt side endocarditis.  SUBJECTIVE:  Awake and alert, responds to voice however variable following of commands  VITAL SIGNS: BP 112/79 mmHg  Pulse 97  Temp(Src) 100.2 F (37.9 C) (Rectal)  Resp 14  Ht  (1.727 m)  Wt 149 lb 11.1 oz (67.9 kg)  BMI 22.77 kg/m2  SpO2 100%  LMP  (LMP Unknown)  HEMODYNAMICS:   VENTILATOR SETTINGS: Vent Mode:  [-] CPAP;PSV FiO2 (%):  [30 %-40 %] 30 % Set Rate:  [14 bmp] 14 bmp Vt Set:  [490 mL] 490 mL PEEP:  [5 cmH20] 5 cmH20 Pressure Support:  [12 cmH20] 12 cmH20 Plateau Pressure:  [18 cmH20-22 cmH20] 22 cmH20  INTAKE / OUTPUT: I/O last 3 completed shifts: In: 4755.2 [I.V.:1495.2; NG/GT:2160; IV Piggyback:1100] Out: 4250 [Urine:3450; Other:800]  PHYSICAL EXAMINATION: General:  Awake Neuro: Moving all extremities- appears to understand questions and responds appropriately HEENT:  Atraumatic, normocephalic, Tracheostomy in place- connected to vent Cardiovascular: regular, tachycardiac, 2/6 systolic murmur Lungs:  Coarse bilaterally Abdomen:  Soft, nontender Musculoskeletal:  Moving all extremities Skin: track marks all over the  body, SCDs present, ~2cm area of erythema and swelling on dorsum of right foot appears improved from yesterday  LABS:  BMET  Recent Labs Lab 10/19/15 0345 10/19/15 1816 10/20/15 0520  NA 137 133* 136  K 3.4* 4.0 4.1  CL 101 98* 98*  CO2 BUN CREATININE 0.37* 0.37* 0.42*  GLUCOSE 97 271* 159*    Electrolytes  Recent Labs Lab 10/17/15 0405  10/18/15 0444  10/18/15 2322 10/19/15 0345 10/19/15 1816 10/20/15 0520  CALCIUM  --   < >  --   < >  --  7.9* 7.7* 7.9*  MG  --   < >  --   < > 1.8  --  2.0 1.9  PHOS 2.9  --  3.2  --   --  2.9  --   --   < > = values in this interval not displayed.  CBC  Recent Labs Lab 10/18/15 0444 10/19/15 0345 10/20/15 0520  WBC 6.0 4.9 4.9  HGB 7.7* 7.0* 7.1*  HCT 25.3* 23.6* 22.8*  PLT 240 223 219    Coag's  Recent Labs Lab 10/13/15 1209  APTT 34  INR 1.33    Sepsis Markers No results for input(s): LATICACIDVEN, PROCALCITON, O2SATVEN in the last 168 hours.  ABG No results for input(s): PHART, PCO2ART, PO2ART in the last 168 hours.  Liver Enzymes No results for input(s): AST, ALT, ALKPHOS, BILITOT, ALBUMIN in the last 168 hours.  Cardiac Enzymes No results for input(s): TROPONINI, PROBNP in the last 168 hours.  Glucose  Recent Labs Lab 10/19/15 0817 10/19/15 1150 10/19/15 1520 10/19/15 2009 10/19/15 2346 10/20/15 0358  GLUCAP 100* 86 132* 99 103* 90    Imaging Dg Chest Port 1 View  10/19/2015  CLINICAL DATA:  35 year old Cassandra status right side thoracentesis at the bedside. Initial encounter. EXAM: PORTABLE CHEST 1 VIEW COMPARISON:  1147 Hours today. FINDINGS: Portable AP semi upright view at 1409 hours. Decreased right pleural effusion. No pneumothorax. Improved right lung base ventilation with residual streaky opacity. Incidental nipple shadow also noted. Stable tracheostomy tube, right PICC line, and visualized enteric feeding tube. Allowing for portable technique the left lung is clear.  Normal cardiac size and mediastinal contours. IMPRESSION: Decreased right pleural effusion and no pneumothorax following right side thoracentesis. Electronically Signed   By: Odessa FlemingH  Hall M.D.   On: 10/19/2015 14:34   Dg Chest Port 1 View  10/19/2015  CLINICAL DATA:  Intubated EXAM: PORTABLE CHEST 1 VIEW COMPARISON:  10/18/2015 FINDINGS: Cardiomediastinal silhouette is stable. There is small right pleural effusion. Hazy atelectasis or infiltrate in right lower lobe. Stable tracheostomy tube position. NG feeding tube coiled within stomach with tip in proximal stomach. Stable right arm PICC line position with tip in SVC. No pulmonary edema. IMPRESSION: There is small right pleural effusion. Hazy atelectasis or infiltrate in right lower lobe. Stable tracheostomy tube position. NG feeding tube coiled within stomach with tip in proximal stomach. Stable right arm PICC line position with tip in SVC. No pulmonary edema. Electronically Signed   By: Natasha MeadLiviu  Pop M.D.   On: 10/19/2015 12:41   Dg Abd Portable 1v  10/19/2015  CLINICAL DATA:  Abdominal pain EXAM: PORTABLE ABDOMEN - 1 VIEW COMPARISON:  None. FINDINGS: Limited rotated abdominal radiograph, best obtainable due to patient condition. Feeding tube is looped over the stomach, tip not visualized but present at the gastric fundus based on chest x-ray from earlier today. Nonobstructive bowel gas pattern. No evidence of intra-abdominal mass effect or calcification. IMPRESSION: Limited study with normal bowel gas pattern. Electronically Signed   By: Marnee SpringJonathon  Watts M.D.   On: 10/19/2015 12:47  Chest Xray- 4/11- remarkably worse on the right today   STUDIES:  4/12 pAbd XR: Normal bowel gas pattern 4/11 pCXR: pulmonary vascular congestion, patchy consolidation of bilateral bases and bilateral pleural effusions R>L 4/5 Chest CT W Contrast- Cavitary lesions- largest- on left - 31mm and on right- 36mm, bilat pleural effusions. 4/5 Head Ct with Contrast- Evolving, large  subacute right MCA territory infarct with unchanged mass effect. 2. Small occipital infarcts better demonstrated on MRI.  4/1 MRI brain w Wo contrast- Large acute right MCA infarct, without hemorrhagic conversion or propagation. Evolving small bilateral occipital lobe infarcts. MRA HEAD: Mildly motion degraded examination. Acute RIGHT distal M1 occlusion consistent with embolic phenomena. 3/29 US renal . No hydronephrosis. 2. Increased parenchymal echogenicity bilaterally compatible with chronic medical renal disease. 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 No significant right-to-left shift or hemorrhagic transformation.BILATERAL occipital lobe hypodensities, subcentimeter size, could represent brain abscesses (versus acute subcortical infarcts). Consider continued surveillance with post infusion MRI. 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 EF of 50-55%  CULTURES: 3/28 BC > mssa 3/28 CSF-normal 3/30>>>  Staph aureus-MSSA again 4/3  B/c>>> FINAL No growth. 4/4 tracheal Cultures >>> Moraxella Final. 4/11 BCx>>NGTD 4/11 UCx>> Yeast 4/12 Right pleural effusion>>   ANTIBIOTICS: Naficillin 3/30>>> 4/6 Meropenem 4/6 >> 4/10 Resumed NAfcillin 4/10 >>  SIGNIFICANT EVENTS: 3/28 admitted 3/30 intubated. PCCM consult 4/4- Picc inserted, Central line removed. 4/6 Antibiotics changed from Nafcillin to Meropenem to broaden coverage to include Gram Neg and Anaerobes 4/6 Tracheostomy 4/10- Meropenem d/c, narrowed back to Nafcillin 4/12 Thoracentesis>> exudative  LINES/TUBES: 3/30 ETT>>> 4/6 3/30 Central line >>>out 4/4 Picc 4/4 >>  Tracheostomy 4/6 >>  DISCUSSION: 35 year old Cassandra with h/o polysubstance abuse now presents with right sided septic emboli ddue to IVDU. Major issues are debilitation s/p CVA, agitation, NAG metabolic acidosis.This has improved. We will  cont supportive care. Off precedex. Still spiking fevers. Head and chest Ct chest 4/5- evolving large infarct and chest Ct with bilat. effusions. Meropenem 5 day course given, and then narrowed back to Nafcillin for MSSA.  ASSESSMENT / PLAN:  PULMONARY A: Acute Hypoxemic respiratory failure 2/2 > Multifocal PNA due to septic emboli, with cavitary lesions Pulm edema>> resolved Concern for G(-) PNA but rpt cultures with Moraxella Exudative Pleural Effusion P:   Back on vent, Trach collar trial vs PSV when able Try to keep fluids even with-  IV lasix 20 BID if further hypotension hold dose. 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 Monitor Cr  Daily Bmet and MAg Goal K- >4, Mag >2.  GASTROINTESTINAL A:   ?mesentric ischemia possibly due to IVDU Anemia likely from sepsis Abdominal Pain- appears improved on exam Loose stools/diarrrhea- C diff neg P:   tolerating feeds pepcid BID for PUD > possible gastritis.  FOBT positive, no gross GI bleed repeat FOBT not yet collected NG tube  HEMATOLOGIC A:   Anemia, possible GIB/gastritis and sepsis- 7.1- 4/13 Thrombocytopenia r/t sepsis - Resolved  P:  scds for DVT prophylaxis Heparin sq held 2/2 anemia Monitor Hb and Hct.  Goal Hct >7 transfuse if less  INFECTIOUS A:   MSSA endocarditis with septic emboli.  Suspected cellulitis involving the drug injection site Persistent fevers leukocytosis- resolved Pleural effusions>> Exudative Possible small abscess right  foot Vulvovaganitis HSV outbreak Yeast UTI P:   Appreciate ID recs- cont nafcillin, considering burden of infection, no surprise she is still spiking fevers Clortrimazole x 3 days (avoiding systemic fluconazole due to QTc) Acyclovir x 7 days Follow Thoracentesis culture data Warm compress to right foot, if doesn't improve can consider I&D Change Foley due to Yeast in urine   ENDOCRINE A:   No acute issues P:   BG intermitently  NEUROLOGIC A:   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  Encephalopathy  Heroine withdrawal  Intermittent Agitation Large right MCA infarct Opioid dependence P:   RASS goal: -1 - 0 Wean off precedex drip. Wean off fentanyl if able Start Methadone  BID to help wean fentanyl Increase seroquel (monitor QTc) Cont clonopin  FAMILY  - Updates: Dr Delton Coombes updated mother at bedside 4/12  Gust Rung, DO  PGY-3, IMTS 8:34 AM 10/20/2015

## 2015-10-20 NOTE — Evaluation (Signed)
Occupational Therapy Evaluation Patient Details Name: Cassandra Wall MRN: [id number] DOB: 02-27-1981 Today's Date: 10/20/2015    History of Present Illness 35 y.o. female with IVDU history, recently in drug rehab but left, continued IVDU here with AMS, somnolence and now noted to have Staph aureus bacteremia, pulmonary septic emboli and septic cerebral emboli. TTE notes two vegetations on tricuspid valve, one on aortic valve. + fever, chills. 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 on 10/06/15, pt with increased agitation presumably due to withdrawal.  She was intubated.  Repeat MRI 4/1 showed evolving large Rt MCA infarct and evolving small bil occipital infarcts.  Chest CT 4/5 showed cavitary lesions bil. lungs and bil pleural effusions.  Tracheostomy 4/6.   Clinical Impression   Pt admitted with above. She demonstrates the below listed deficits and will benefit from continued OT to maximize safety and independence with BADLs.  Pt presents to OT with significant deficits including Lt hemiplegia (appears flaccid), poor trunk control, impaired balance, increased tone/spasticity trunk, head/neck, visual/perceptual deficits.  Unable to accurately assess cognition due to trach and limited activity tolerance.  She currently requires total A for ADLs.   She will likely need SNF at discharge, and extensive assist.   Will follow acutely.       Follow Up Recommendations  SNF;Supervision/Assistance - 24 hour    Equipment Recommendations  None recommended by OT    Recommendations for Other Services       Precautions / Restrictions Precautions Precautions: Fall      Mobility Bed Mobility                  Transfers                 General transfer comment: unable to attempt     Balance Overall balance assessment: Needs assistance Sitting-balance support: Feet supported Sitting balance-Leahy Scale: Zero Sitting  balance - Comments: sitting EOC, pt requires total A to maitain balance  Postural control: Posterior lean                                  ADL Overall ADL's : Needs assistance/impaired Eating/Feeding: NPO   Grooming: Wash/dry hands;Wash/dry face;Brushing hair;Maximal assistance;Sitting   Upper Body Bathing: Total assistance;Sitting   Lower Body Bathing: Total assistance;Bed level   Upper Body Dressing : Total assistance;Sitting   Lower Body Dressing: Total assistance;Bed level   Toilet Transfer: Total assistance Toilet Transfer Details (indicate cue type and reason): unable to safely attempt with +1 assist  Toileting- Clothing Manipulation and Hygiene: Total assistance;Bed level       Functional mobility during ADLs: Total assistance       Vision Additional Comments: Pt shakes head "no" when asked if she has diplopia.  She does not respond when asked if vision is blurry.  She will track therapist on Lt and Rt and spontaneously looked to therapist and waved with therapist on pt's left    Perception Perception Comments: unable to accurately assess    Praxis Praxis Praxis-Other Comments: unable to accurately assess at this time     Pertinent Vitals/Pain Pain Assessment: Faces Faces Pain Scale: Hurts even more Pain Location: head/neck, back with mobility  Pain Descriptors / Indicators: Guarding;Grimacing Pain Intervention(s): Limited activity within patient's tolerance;Monitored during session;Repositioned     Hand Dominance Right   Extremity/Trunk Assessment Upper Extremity Assessment Upper Extremity Assessment: RUE  deficits/detail;LUE deficits/detail RUE Deficits / Details: generalized weakness.  Pt moving Rt UE spontaneously LUE Deficits / Details: Flaccid, no movement noted.  PROM WFL LUE Coordination: decreased fine motor;decreased gross motor   Lower Extremity Assessment Lower Extremity Assessment: Defer to PT evaluation   Cervical / Trunk  Assessment Cervical / Trunk Assessment: Other exceptions Cervical / Trunk Exceptions: head/neck flexed and rotated to Rt.  She will attempt to assist with neck ROM.  Tightness noted throughout ROM.  Pt unable to lift head fully against gravity.  pt with flexed trunk.  Significant tightness noted, difficult to determine if purely tightness or increased tone    Communication Communication Communication: Tracheostomy   Cognition Arousal/Alertness: Awake/alert Behavior During Therapy: Flat affect Overall Cognitive Status: Difficult to assess                 General Comments: Pt follows one step motor commands consistently.  She will nod yes/no inconsistently.  Appears to have some delayed responses    General Comments       Exercises       Shoulder Instructions      Home Living Family/patient expects to be discharged to:: Skilled nursing facility                                        Prior Functioning/Environment Level of Independence: Independent             OT Diagnosis: Generalized weakness;Cognitive deficits;Disturbance of vision;Hemiplegia non-dominant side;Apraxia   OT Problem List: Decreased strength;Decreased range of motion;Decreased activity tolerance;Impaired balance (sitting and/or standing);Impaired vision/perception;Decreased coordination;Decreased cognition;Decreased safety awareness;Decreased knowledge of precautions;Decreased knowledge of use of DME or AE;Cardiopulmonary status limiting activity;Impaired sensation;Impaired tone;Impaired UE functional use;Pain   OT Treatment/Interventions: Self-care/ADL training;Neuromuscular education;Energy conservation;DME and/or AE instruction;Manual therapy;Splinting;Therapeutic activities;Cognitive remediation/compensation;Visual/perceptual remediation/compensation;Patient/family education;Balance training    OT Goals(Current goals can be found in the care plan section) Acute Rehab OT Goals OT Goal  Formulation: Patient unable to participate in goal setting Potential to Achieve Goals: Fair ADL Goals Pt Will Perform Grooming: with mod assist;sitting Additional ADL Goal #1: Pt will sit unsupported with mod A in prep for ADLs Additional ADL Goal #2: Pt will sustain attention to familiar ADL tasks for 10 mins with min cues Additional ADL Goal #3: Pt will participate in 15 mins therapeutic activity to increase activity tolerance needed for ADLs.   OT Frequency: Min 3X/week   Barriers to D/C: Decreased caregiver support          Co-evaluation              End of Session Equipment Utilized During Treatment: Oxygen Nurse Communication: Mobility status  Activity Tolerance: Patient limited by fatigue Patient left: in chair;with call bell/phone within reach   Time: 1532-1557 OT Time Calculation (min): 25 min Charges:  OT General Charges $OT Visit: 1 Procedure OT Evaluation $OT Eval High Complexity: 1 Procedure OT Treatments $Neuromuscular Re-education: 8-22 mins G-Codes:    Cassandra Wall October 31, 2015, 5:30 PM

## 2015-10-21 ENCOUNTER — Encounter: Payer: Self-pay | Admitting: Internal Medicine

## 2015-10-21 DIAGNOSIS — R197 Diarrhea, unspecified: Secondary | ICD-10-CM

## 2015-10-21 DIAGNOSIS — L02611 Cutaneous abscess of right foot: Secondary | ICD-10-CM

## 2015-10-21 DIAGNOSIS — L02419 Cutaneous abscess of limb, unspecified: Secondary | ICD-10-CM | POA: Insufficient documentation

## 2015-10-21 DIAGNOSIS — L03119 Cellulitis of unspecified part of limb: Secondary | ICD-10-CM

## 2015-10-21 LAB — GLUCOSE, CAPILLARY
GLUCOSE-CAPILLARY: 91 mg/dL (ref 65–99)
GLUCOSE-CAPILLARY: 97 mg/dL (ref 65–99)
Glucose-Capillary: 126 mg/dL — ABNORMAL HIGH (ref 65–99)
Glucose-Capillary: 87 mg/dL (ref 65–99)
Glucose-Capillary: 98 mg/dL (ref 65–99)
Glucose-Capillary: 106 mg/dL — ABNORMAL HIGH (ref 65–99)

## 2015-10-21 LAB — BASIC METABOLIC PANEL
ANION GAP: 7 (ref 5–15)
Anion gap: 8 (ref 5–15)
BUN: 8 mg/dL (ref 6–20)
BUN: 9 mg/dL (ref 6–20)
CALCIUM: 7.8 mg/dL — AB (ref 8.9–10.3)
CALCIUM: 8 mg/dL — AB (ref 8.9–10.3)
CHLORIDE: 103 mmol/L (ref 101–111)
CO2: 31 mmol/L (ref 22–32)
CO2: 28 mmol/L (ref 22–32)
CREATININE: 0.39 mg/dL — AB (ref 0.44–1.00)
CREATININE: 0.45 mg/dL (ref 0.44–1.00)
Chloride: 102 mmol/L (ref 101–111)
GFR calc Af Amer: >60 mL/min (ref >60)
GFR calc non Af Amer: >60 mL/min (ref >60)
GFR calc non Af Amer: >60 mL/min (ref >60)
GLUCOSE: 100 mg/dL — AB (ref 65–99)
Glucose, Bld: 98 mg/dL (ref 65–99)
Potassium: 3.4 mmol/L — ABNORMAL LOW (ref 3.5–5.1)
Potassium: 4.3 mmol/L (ref 3.5–5.1)
Sodium: 141 mmol/L (ref 135–145)
Sodium: 138 mmol/L (ref 135–145)

## 2015-10-21 LAB — MAGNESIUM: Magnesium: 1.7 mg/dL (ref 1.7–2.4)

## 2015-10-21 LAB — RHEUMATOID FACTORS, FLUID: Rheumatoid Arthritis, Qn/Fluid: NEGATIVE

## 2015-10-21 MED ORDER — MAGNESIUM SULFATE 2 GM/50ML IV SOLN
2.0000 g | Freq: Once | INTRAVENOUS | Status: AC
  Administered 2015-10-21: 2 g via INTRAVENOUS
  Filled 2015-10-21: qty 50

## 2015-10-21 MED ORDER — POTASSIUM CHLORIDE 20 MEQ/15ML (10%) PO SOLN
40.0000 meq | ORAL | Status: AC
  Administered 2015-10-21 (×2): 40 meq
  Filled 2015-10-21 (×2): qty 30

## 2015-10-21 MED ORDER — POTASSIUM CHLORIDE 20 MEQ/15ML (10%) PO SOLN
40.0000 meq | Freq: Every day | ORAL | Status: DC
  Administered 2015-10-21 – 2015-10-26 (×6): 40 meq via ORAL
  Filled 2015-10-21 (×7): qty 30

## 2015-10-21 MED ORDER — ALTEPLASE 2 MG IJ SOLR
2.0000 mg | Freq: Once | INTRAMUSCULAR | Status: AC
  Administered 2015-10-21: 2 mg
  Filled 2015-10-21 (×2): qty 2

## 2015-10-21 MED ORDER — ALTEPLASE 2 MG IJ SOLR
2.0000 mg | Freq: Once | INTRAMUSCULAR | Status: AC
  Administered 2015-10-21: 2 mg
  Filled 2015-10-21: qty 2

## 2015-10-21 MED ORDER — LIDOCAINE HCL (PF) 1 % IJ SOLN
INTRAMUSCULAR | Status: AC
  Filled 2015-10-21: qty 5

## 2015-10-21 NOTE — Progress Notes (Signed)
PULMONARY / CRITICAL CARE MEDICINE Daily progress note   Name: Cassandra ChouSamantha Wall MRN: [id number] DOB: February 09, 1981    ADMISSION DATE:  10/04/2015 CONSULTATION DATE:  10/06/15  REFERRING MD:  Patel,P  CHIEF COMPLAINT: Hypertensive urgency related to opioid withdrawl  HISTORY OF PRESENT ILLNESS:  Cassandra Wall is a 35 year old female with past medical history significant for polysubstance abuse(iv heroine and cocaine), tobacco abuse who presented to the ED with complaints of left sided weakness which was going on for two days prior to the admission.  CT of the brain was concerning for right sided infarct She was evaluated by neurology and they think it is septic emboli from IVDU. She has track marks all over her body. From her note it appears that her brother passed away with endocarditis due to IVDU. Rt side endocarditis.  SUBJECTIVE:  Awake and alert, responds to voice and following commands  VITAL SIGNS: BP 89/54 mmHg  Pulse 95  Temp(Src) 99.7 F (37.6 C) (Oral)  Resp 25  Ht 5\' 8"  (1.727 m)  Wt 63.7 kg (140 lb 6.9 oz)  BMI 21.36 kg/m2  SpO2 100%  LMP  (LMP Unknown)  HEMODYNAMICS:   VENTILATOR SETTINGS: Vent Mode:  [-] PSV;CPAP FiO2 (%):  [30 %-40 %] 35 % Set Rate:  [14 bmp] 14 bmp Vt Set:  [490 mL] 490 mL PEEP:  [5 cmH20] 5 cmH20 Pressure Support:  [10 cmH20-15 cmH20] 10 cmH20 Plateau Pressure:  [17 cmH20-18 cmH20] 17 cmH20  INTAKE / OUTPUT: I/O last 3 completed shifts: In: 5321.7 [I.V.:1442.7; NG/GT:2579; IV Piggyback:1300] Out: 5850 [Urine:5250; Emesis/NG output:600]  PHYSICAL EXAMINATION: General:  Awake Neuro: Moving all extremities- mental status seems improved today and following commands HEENT:  Atraumatic, normocephalic, Tracheostomy in place- connected to vent Cardiovascular: regular, tachycardiac, 2/6 systolic murmur Lungs:  Coarse bilaterally Abdomen:  Soft, nontender Musculoskeletal:  Moving all extremities Skin: track marks all over the body,  SCDs present, ~2cm area of erythema and swelling on dorsum of right foot remains fluctuant   LABS:  BMET  Recent Labs Lab 10/19/15 1816 10/20/15 0520 10/21/15 0600  NA 133* 136 141  K 4.0 4.1 3.4*  CL 98* 98* 103  CO2 27 30 31   BUN 9 10 8   CREATININE 0.37* 0.42* 0.39*  GLUCOSE 271* 159* 100*    Electrolytes  Recent Labs Lab 10/17/15 0405  10/18/15 0444  10/19/15 0345 10/19/15 1816 10/20/15 0520 10/21/15 0600  CALCIUM  --   < >  --   < > 7.9* 7.7* 7.9* 7.8*  MG  --   < >  --   < >  --  2.0 1.9 1.7  PHOS 2.9  --  3.2  --  2.9  --   --   --   < > = values in this interval not displayed.  CBC  Recent Labs Lab 10/18/15 0444 10/19/15 0345 10/20/15 0520  WBC 6.0 4.9 4.9  HGB 7.7* 7.0* 7.1*  HCT 25.3* 23.6* 22.8*  PLT 240 223 219    Coag's No results for input(s): APTT, INR in the last 168 hours.  Sepsis Markers No results for input(s): LATICACIDVEN, PROCALCITON, O2SATVEN in the last 168 hours.  ABG No results for input(s): PHART, PCO2ART, PO2ART in the last 168 hours.  Liver Enzymes No results for input(s): AST, ALT, ALKPHOS, BILITOT, ALBUMIN in the last 168 hours.  Cardiac Enzymes No results for input(s): TROPONINI, PROBNP in the last 168 hours.  Glucose  Recent Labs Lab 10/20/15 1546  10/20/15 2002 10/20/15 2338 10/21/15 0408 10/21/15 0842 10/21/15 1139  GLUCAP 110* 87 91 97 126* 87    Imaging No results found.Chest Xray- 4/11- remarkably worse on the right today   STUDIES:  4/12 pAbd XR: Normal bowel gas pattern 4/11 pCXR: pulmonary vascular congestion, patchy consolidation of bilateral bases and bilateral pleural effusions R>L 4/5 Chest CT W Contrast- Cavitary lesions- largest- on left - 31mm and on right- 36mm, bilat pleural effusions. 4/5 Head Ct with Contrast- Evolving, large subacute right MCA territory infarct with unchanged mass effect. 2. Small occipital infarcts better demonstrated on MRI.  4/1 MRI brain w Wo contrast- Large  acute right MCA infarct, without hemorrhagic conversion or propagation. Evolving small bilateral occipital lobe infarcts. MRA HEAD: Mildly motion degraded examination. Acute RIGHT distal M1 occlusion consistent with embolic phenomena. 3/29 US renal . No hydronephrosis. 2. Increased parenchymal echogenicity bilaterally compatible with chronic medical renal disease. 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 No significant right-to-left shift or hemorrhagic transformation.BILATERAL occipital lobe hypodensities, subcentimeter size, could represent brain abscesses (versus acute subcortical infarcts). Consider continued surveillance with post infusion MRI. 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 EF of 50-55%  CULTURES: 3/28 BC > mssa 3/28 CSF-normal 3/30>>>  Staph aureus-MSSA again 4/3  B/c>>> FINAL No growth. 4/4 tracheal Cultures >>> Moraxella Final. 4/11 BCx>>NGTD 4/11 UCx>> Yeast 4/12 Right pleural effusion>> NGTD   ANTIBIOTICS: Naficillin 3/30>>> 4/6 Meropenem 4/6 >> 4/10 Resumed NAfcillin 4/10 >>  SIGNIFICANT EVENTS: 3/28 admitted 3/30 intubated. PCCM consult 4/4- Picc inserted, Central line removed. 4/6 Antibiotics changed from Nafcillin to Meropenem to broaden coverage to include Gram Neg and Anaerobes 4/6 Tracheostomy 4/10- Meropenem d/c, narrowed back to Nafcillin 4/12 Thoracentesis>> exudative  LINES/TUBES: 3/30 ETT>>> 4/6 3/30 Central line >>>out 4/4 Picc 4/4 >>  Tracheostomy 4/6 >> 4/13 foley >>  DISCUSSION: 35 year old female with h/o polysubstance abuse now presents with right sided septic emboli ddue to IVDU. Major issues are debilitation s/p CVA, agitation, NAG metabolic acidosis.This has improved. We will cont supportive care. Off precedex. Still spiking fevers. Head and chest Ct chest 4/5- evolving large infarct and chest Ct with bilat.  effusions. Meropenem 5 day course given, and then narrowed back to Nafcillin for MSSA.  ASSESSMENT / PLAN:  PULMONARY A: Acute Hypoxemic respiratory failure 2/2 > Multifocal PNA due to septic emboli, with cavitary lesions Pulm edema>> resolved Concern for G(-) PNA but rpt cultures with Moraxella Exudative Pleural Effusion s/p Thoracentesis P:   Will try to get to trach collar today Try to keep fluids even with-  IV lasix 20 BID if further hypotension hold dose. 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 Monitor Cr  Daily Bmet and MAg Goal K- >4, Mag >2. Will go ahead and schedule potassium daily x 7 days  GASTROINTESTINAL A:   ?mesentric ischemia possibly due to IVDU Anemia likely from sepsis Abdominal Pain-  appears improved on exam Loose stools/diarrrhea- C diff neg P:   tolerating feeds pepcid BID for PUD > possible gastritis.  NG tube  HEMATOLOGIC A:   Anemia, possible GIB/gastritis and sepsis- 7.1- 4/13 Thrombocytopenia r/t sepsis - Resolved  P:  scds for DVT prophylaxis Heparin sq held 2/2 anemia Monitor Hb and Hct.  Goal Hct >7 transfuse if less  INFECTIOUS A:   MSSA endocarditis with septic emboli.  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  Encephalopathy  Heroine withdrawal  Intermittent Agitation Large right MCA infarct Opioid dependence P:   RASS goal: 0 Wean off precedex drip. Wean off fentanyl if able Methadone  BID to help wean fentanyl seroquel (monitor QTc) Cont clonopin  FAMILY  - Updates: Updated mother at bedside 4/14  Gust Rung, DO  PGY-3, IMTS 12:33 PM 10/21/2015

## 2015-10-21 NOTE — Procedures (Signed)
Incison & Drainage Procedure Note Indication: abscess of right foot Procedure - Incision and Drainage Resident - Dr Mikey BussingHoffman Attending - Dy Catrice Zuleta  Patient positioned appropriately consent was obtained from mother, the area was sterilized the iodine, 5cc lidocaine without epinephrine was used as a local anesthetic. #11 blade scalpal used for single incision.  Blunt dissection of loculated adhesions was not done. Purulent drainage was expressed, this was not sent for culture. Wound was not packed. Procedure tolerated without complications. Wound dressed with sterile 2x2 guaze and tape.    Attending Note I supervised the procedure.   Levy Pupaobert Saliha Salts, MD, PhD 10/21/2015, 6:13 PM [facility] [phone] or if no answer [phone]

## 2015-10-21 NOTE — Progress Notes (Signed)
Subjective: More diarrhea   Antibiotics:  Anti-infectives    Start     Dose/Rate Route Frequency Ordered Stop   10/18/15 1700  acyclovir (ZOVIRAX) 200 MG/5ML suspension SUSP 400 mg     400 mg Oral 3 times per day 10/18/15 1631     10/18/15 1200  nafcillin 2 g in dextrose 5 % 100 mL IVPB     2 g 200 mL/hr over 30 Minutes Intravenous 6 times per day 10/18/15 1102     10/17/15 1600  nafcillin injection 2 g  Status:  Discontinued     2 g Intravenous Every 4 hours 10/17/15 1413 10/17/15 1520   10/17/15 1500  nafcillin 2 g in dextrose 5 % 100 mL IVPB  Status:  Discontinued     2 g 200 mL/hr over 30 Minutes Intravenous 4 times per day 10/17/15 1520 10/18/15 1102   10/13/15 1400  meropenem (MERREM) 2 g in sodium chloride 0.9 % 100 mL IVPB  Status:  Discontinued     2 g 200 mL/hr over 30 Minutes Intravenous 3 times per day 10/13/15 0946 10/17/15 1426   10/06/15 1200  ceFAZolin (ANCEF) IVPB 2g/100 mL premix  Status:  Discontinued    Comments:  Pharmacy may adjust   2 g 200 mL/hr over 30 Minutes Intravenous 3 times per day 10/06/15 1019 10/06/15 1019   10/06/15 1200  nafcillin 2 g in dextrose 5 % 100 mL IVPB  Status:  Discontinued     2 g 200 mL/hr over 30 Minutes Intravenous 6 times per day 10/06/15 1042 10/13/15 0946   10/06/15 1100  nafcillin injection 2 g  Status:  Discontinued    Comments:  Pharmacy may adjust   2 g Intravenous Every 4 hours 10/06/15 1019 10/06/15 1042   10/05/15 1100  ceFAZolin (ANCEF) IVPB 2g/100 mL premix  Status:  Discontinued    Comments:  Pharmacy may adjust   2 g 200 mL/hr over 30 Minutes Intravenous Every 12 hours 10/05/15 0959 10/06/15 1019   10/05/15 0500  ceFEPIme (MAXIPIME) 2 g in dextrose 5 % 50 mL IVPB  Status:  Discontinued     2 g 100 mL/hr over 30 Minutes Intravenous Every 24 hours 10/04/15 1444 10/05/15 0959   10/04/15 1215  ampicillin (OMNIPEN) 2 g in sodium chloride 0.9 % 50 mL IVPB     2 g 150 mL/hr over 20 Minutes Intravenous NOW  10/04/15 1208 10/04/15 1305   10/04/15 0530  ceFEPIme (MAXIPIME) 2 g in dextrose 5 % 50 mL IVPB     2 g 100 mL/hr over 30 Minutes Intravenous  Once 10/04/15 0529 10/04/15 0612   10/04/15 0445  cefTRIAXone (ROCEPHIN) 2 g in dextrose 5 % 50 mL IVPB     2 g 100 mL/hr over 30 Minutes Intravenous  Once 10/04/15 0441 10/04/15 0529   10/04/15 0445  vancomycin (VANCOCIN) 2,000 mg in sodium chloride 0.9 % 500 mL IVPB     2,000 mg 250 mL/hr over 120 Minutes Intravenous  Once 10/04/15 0441 10/04/15 1234   10/04/15 0445  ampicillin (OMNIPEN) 2 g in sodium chloride 0.9 % 50 mL IVPB  Status:  Discontinued     2 g 150 mL/hr over 20 Minutes Intravenous  Once 10/04/15 0441 10/04/15 0532      Medications: Scheduled Meds: . acyclovir  400 mg Oral 3 times per day  . antiseptic oral rinse  7 mL Mouth Rinse QID  . chlorhexidine gluconate (SAGE  KIT)  15 mL Mouth Rinse BID  . clonazePAM  2 mg Per Tube TID  . etomidate  40 mg Intravenous Once  . fentaNYL  100 mcg Transdermal Q72H  . fentaNYL (SUBLIMAZE) injection  200 mcg Intravenous Once  . furosemide  20 mg Intravenous BID  . methadone  20 mg Per Tube BID  . nafcillin IV  2 g Intravenous 6 times per day  . pantoprazole (PROTONIX) IV  40 mg Intravenous Q12H  . potassium chloride  40 mEq Per Tube Q4H  . QUEtiapine  100 mg Oral BID  . sodium chloride flush  10-40 mL Intracatheter Q12H   Continuous Infusions: . sodium chloride Stopped (10/08/15 0000)  . dexmedetomidine 1.2 mcg/kg/hr (10/21/15 0647)  . feeding supplement (VITAL AF 1.2 CAL) 1,000 mL (10/20/15 1959)  . fentaNYL infusion INTRAVENOUS 250 mcg/hr (10/21/15 0641)   PRN Meds:.acetaminophen (TYLENOL) oral liquid 160 mg/5 mL, docusate, fentaNYL (SUBLIMAZE) injection, [DISCONTINUED] ondansetron **OR** ondansetron (ZOFRAN) IV, sodium chloride flush    Objective: Weight change: -9 lb 4.2 oz (-4.2 kg)  Intake/Output Summary (Last 24 hours) at 10/21/15 1122 Last data filed at 10/21/15 1100   Gross per 24 hour  Intake 3229.54 ml  Output   4500 ml  Net -1270.46 ml   Blood pressure 90/53, pulse 105, temperature 100.4 F (38 C), temperature source Oral, resp. rate 28, height 5' 8"  (1.727 m), weight 140 lb 6.9 oz (63.7 kg), SpO2 100 %. Temp:  [98.2 F (36.8 C)-100.4 F (38 C)] 100.4 F (38 C) (04/14 0844) Pulse Rate:  [81-117] 105 (04/14 1100) Resp:  [14-28] 28 (04/14 1100) BP: (85-126)/(49-79) 90/53 mmHg (04/14 1100) SpO2:  [99 %-100 %] 100 % (04/14 1100) FiO2 (%):  [30 %-40 %] 40 % (04/14 1008) Weight:  [140 lb 6.9 oz (63.7 kg)] 140 lb 6.9 oz (63.7 kg) (04/14 0500)  Physical Exam: General:ith tracheostomy, following commands, moving all 4 extremities HEENT: anicteric sclera,  EOMI CVS regular rate, could not hear a murmur Chest:rhonchi throughout Abdomen: soft nontender, nondistended, normal bowel sounds, Extremities: no  clubbing or edema noted bilaterally Skin: Numerous injection sites throughout her body, PICC line in place , IJ out   she has a tender area of fluctuance on right foot more red today also area of drainage on arm  10/21/15:   Foot      Right arm      Neuro: moving all extremities, following commands  CBC:  CBC Latest Ref Rng 10/20/2015 10/19/2015 10/18/2015  WBC 4.0 - 10.5 K/uL 4.9 4.9 6.0  Hemoglobin 12.0 - 15.0 g/dL 7.1(L) 7.0(L) 7.7(L)  Hematocrit 36.0 - 46.0 % 22.8(L) 23.6(L) 25.3(L)  Platelets 150 - 400 K/uL 219 223 240       BMET  Recent Labs  10/20/15 0520 10/21/15 0600  NA 136 141  K 4.1 3.4*  CL 98* 103  CO2 30 31  GLUCOSE 159* 100*  BUN 10 8  CREATININE 0.42* 0.39*  CALCIUM 7.9* 7.8*     Liver Panel   Recent Labs  10/19/15 1816  PROT 5.8*       Sedimentation Rate No results for input(s): ESRSEDRATE in the last 72 hours. C-Reactive Protein No results for input(s): CRP in the last 72 hours.  Micro Results: Recent Results (from the past 720 hour(s))  Culture, blood (single)     Status: None    Collection Time: 10/04/15  4:46 AM  Result Value Ref Range Status   Specimen Description BLOOD LEFT HIP  Final   Special Requests IN PEDIATRIC BOTTLE 5CC  Final   Culture  Setup Time   Final    GRAM POSITIVE COCCI IN CLUSTERS AEROBIC BOTTLE ONLY CRITICAL RESULT CALLED TO, READ BACK BY AND VERIFIED WITHOneta Rack HB [id number] 10/04/15 A BROWNING    Culture   Final    STAPHYLOCOCCUS AUREUS Performed at [facility]    Report Status 10/06/2015 FINAL  Final   Organism ID, Bacteria STAPHYLOCOCCUS AUREUS  Final      Susceptibility   Staphylococcus aureus - MIC*    CIPROFLOXACIN <=0.5 SENSITIVE Sensitive     ERYTHROMYCIN <=0.25 SENSITIVE Sensitive     GENTAMICIN <=0.5 SENSITIVE Sensitive     OXACILLIN <=0.25 SENSITIVE Sensitive     TETRACYCLINE <=1 SENSITIVE Sensitive     VANCOMYCIN 1 SENSITIVE Sensitive     TRIMETH/SULFA <=10 SENSITIVE Sensitive     CLINDAMYCIN <=0.25 SENSITIVE Sensitive     RIFAMPIN <=0.5 SENSITIVE Sensitive     Inducible Clindamycin NEGATIVE Sensitive     * STAPHYLOCOCCUS AUREUS  CSF culture     Status: None   Collection Time: 10/04/15  6:00 AM  Result Value Ref Range Status   Specimen Description CSF  Final   Special Requests Normal  Final   Gram Stain   Final    CYTOSPIN WBC PRESENT,BOTH PMN AND MONONUCLEAR NO ORGANISMS SEEN Gram Stain Report Called to,Read Back By and Verified With: A. DENNIS RN AT 0700 ON 03.28.17 BY SHUEA    Culture   Final    NO GROWTH 3 DAYS Performed at [facility]    Report Status 10/07/2015 FINAL  Final  Anaerobic culture     Status: None   Collection Time: 10/04/15  6:00 AM  Result Value Ref Range Status   Specimen Description CSF  Final   Special Requests NONE  Final   Gram Stain   Final    WBC PRESENT,BOTH PMN AND MONONUCLEAR NO ORGANISMS SEEN CYTOSPIN SMEAR    Culture   Final    NO ANAEROBES ISOLATED Performed at [facility]    Report Status 10/09/2015 FINAL  Final  MRSA PCR Screening     Status:  None   Collection Time: 10/04/15 10:52 PM  Result Value Ref Range Status   MRSA by PCR NEGATIVE NEGATIVE Final    Comment:        The GeneXpert MRSA Assay (FDA approved for NASAL specimens only), is one component of a comprehensive MRSA colonization surveillance program. It is not intended to diagnose MRSA infection nor to guide or monitor treatment for MRSA infections.   Culture, blood (routine x 2)     Status: None   Collection Time: 10/06/15  8:34 AM  Result Value Ref Range Status   Specimen Description BLOOD RIGHT ANTECUBITAL  Final   Special Requests IN PEDIATRIC BOTTLE 1CC  Final   Culture  Setup Time   Final    GRAM POSITIVE COCCI IN CLUSTERS AEROBIC BOTTLE ONLY CRITICAL RESULT CALLED TO, READ BACK BY AND VERIFIED WITH: S WHITE,RN @0247  10/07/15 MKELLY    Culture   Final    STAPHYLOCOCCUS AUREUS SUSCEPTIBILITIES PERFORMED ON PREVIOUS CULTURE WITHIN THE LAST 5 DAYS.    Report Status 10/08/2015 FINAL  Final  Culture, blood (routine x 2)     Status: None   Collection Time: 10/06/15  8:37 AM  Result Value Ref Range Status   Specimen Description BLOOD LEFT HAND  Final   Special Requests IN  PEDIATRIC BOTTLE 2CC  Final   Culture  Setup Time   Final    GRAM POSITIVE COCCI IN CLUSTERS IN PEDIATRIC BOTTLE CRITICAL RESULT CALLED TO, READ BACK BY AND VERIFIED WITH: L WILSON,RN AT 5631 10/07/15 BY L BENFIELD    Culture   Final    STAPHYLOCOCCUS AUREUS SUSCEPTIBILITIES PERFORMED ON PREVIOUS CULTURE WITHIN THE LAST 5 DAYS.    Report Status 10/08/2015 FINAL  Final  Culture, respiratory (NON-Expectorated)     Status: None   Collection Time: 10/08/15  1:17 PM  Result Value Ref Range Status   Specimen Description TRACHEAL ASPIRATE  Final   Special Requests NONE  Final   Gram Stain   Final    ABUNDANT WBC PRESENT, PREDOMINANTLY PMN FEW SQUAMOUS EPITHELIAL CELLS PRESENT ABUNDANT YEAST FEW GRAM POSITIVE COCCI IN PAIRS IN CLUSTERS IN CHAINS THIS SPECIMEN IS ACCEPTABLE FOR SPUTUM  CULTURE Performed at [facility]    Culture   Final    MODERATE CANDIDA ALBICANS Performed at [facility]    Report Status 10/11/2015 FINAL  Final  Culture, blood (Routine X 2) w Reflex to ID Panel     Status: None   Collection Time: 10/10/15  4:04 PM  Result Value Ref Range Status   Specimen Description BLOOD RIGHT FOOT  Final   Special Requests IN PEDIATRIC BOTTLE 0.75CC  Final   Culture NO GROWTH 5 DAYS  Final   Report Status 10/15/2015 FINAL  Final  Culture, blood (Routine X 2) w Reflex to ID Panel     Status: None   Collection Time: 10/10/15  4:10 PM  Result Value Ref Range Status   Specimen Description BLOOD LEFT FOOT  Final   Special Requests IN PEDIATRIC BOTTLE 0.75CC  Final   Culture NO GROWTH 5 DAYS  Final   Report Status 10/15/2015 FINAL  Final  Culture, respiratory (NON-Expectorated)     Status: None   Collection Time: 10/11/15 11:30 AM  Result Value Ref Range Status   Specimen Description TRACHEAL ASPIRATE  Final   Special Requests NONE  Final   Gram Stain   Final    FEW WBC PRESENT, PREDOMINANTLY PMN RARE SQUAMOUS EPITHELIAL CELLS PRESENT FEW GRAM POSITIVE COCCI IN PAIRS RARE GRAM NEGATIVE RODS RARE GRAM NEGATIVE COCCI Performed at [facility]    Culture   Final    MORAXELLA CATARRHALIS(BRANHAMELLA) Note: BETA LACTAMASE NEGATIVE Performed at [facility]    Report Status 10/13/2015 FINAL  Final  Culture, Urine     Status: Abnormal   Collection Time: 10/16/15 11:15 AM  Result Value Ref Range Status   Specimen Description URINE, CATHETERIZED  Final   Special Requests NONE  Final   Culture >=100,000 COLONIES/mL YEAST (A)  Final   Report Status 10/17/2015 FINAL  Final  Culture, blood (routine x 2)     Status: None (Preliminary result)   Collection Time: 10/18/15  1:54 PM  Result Value Ref Range Status   Specimen Description BLOOD LEFT ARM  Final   Special Requests IN PEDIATRIC BOTTLE 2CC  Final   Culture NO GROWTH 2  DAYS  Final   Report Status PENDING  Incomplete  Culture, respiratory (NON-Expectorated)     Status: None   Collection Time: 10/18/15  2:10 PM  Result Value Ref Range Status   Specimen Description TRACHEAL ASPIRATE  Final   Special Requests NONE  Final   Gram Stain   Final    FEW WBC PRESENT, PREDOMINANTLY PMN RARE SQUAMOUS  EPITHELIAL CELLS PRESENT RARE YEAST RARE GRAM POSITIVE COCCI IN PAIRS Performed at [facility]    Culture   Final    FEW YEAST CONSISTENT WITH CANDIDA SPECIES Performed at [facility]    Report Status 10/20/2015 FINAL  Final  Urine culture     Status: Abnormal   Collection Time: 10/18/15  2:35 PM  Result Value Ref Range Status   Specimen Description URINE, RANDOM  Final   Special Requests NONE  Final   Culture >=100,000 COLONIES/mL YEAST (A)  Final   Report Status 10/19/2015 FINAL  Final  Culture, blood (routine x 2)     Status: None (Preliminary result)   Collection Time: 10/18/15  5:22 PM  Result Value Ref Range Status   Specimen Description BLOOD LEFT ANTECUBITAL  Final   Special Requests IN PEDIATRIC BOTTLE 1CC  Final   Culture NO GROWTH 2 DAYS  Final   Report Status PENDING  Incomplete  C difficile quick scan w PCR reflex     Status: None   Collection Time: 10/19/15 10:25 AM  Result Value Ref Range Status   C Diff antigen NEGATIVE NEGATIVE Final   C Diff toxin NEGATIVE NEGATIVE Final   C Diff interpretation Negative for toxigenic C. difficile  Final  Gram stain     Status: None   Collection Time: 10/19/15  1:53 PM  Result Value Ref Range Status   Specimen Description FLUID RIGHT PLEURAL  Final   Special Requests NONE  Final   Gram Stain   Final    MODERATE WBC PRESENT,BOTH PMN AND MONONUCLEAR NO ORGANISMS SEEN    Report Status 10/19/2015 FINAL  Final  Culture, body fluid-bottle     Status: None (Preliminary result)   Collection Time: 10/19/15  1:53 PM  Result Value Ref Range Status   Specimen Description FLUID RIGHT  PLEURAL  Final   Special Requests NONE  Final   Culture NO GROWTH < 24 HOURS  Final   Report Status PENDING  Incomplete    Studies/Results: Dg Chest Port 1 View  10/19/2015  CLINICAL DATA:  35 year old female status right side thoracentesis at the bedside. Initial encounter. EXAM: PORTABLE CHEST 1 VIEW COMPARISON:  1147 Hours today. FINDINGS: Portable AP semi upright view at 1409 hours. Decreased right pleural effusion. No pneumothorax. Improved right lung base ventilation with residual streaky opacity. Incidental nipple shadow also noted. Stable tracheostomy tube, right PICC line, and visualized enteric feeding tube. Allowing for portable technique the left lung is clear. Normal cardiac size and mediastinal contours. IMPRESSION: Decreased right pleural effusion and no pneumothorax following right side thoracentesis. Electronically Signed   By: Genevie Ann M.D.   On: 10/19/2015 14:34   Dg Chest Port 1 View  10/19/2015  CLINICAL DATA:  Intubated EXAM: PORTABLE CHEST 1 VIEW COMPARISON:  10/18/2015 FINDINGS: Cardiomediastinal silhouette is stable. There is small right pleural effusion. Hazy atelectasis or infiltrate in right lower lobe. Stable tracheostomy tube position. NG feeding tube coiled within stomach with tip in proximal stomach. Stable right arm PICC line position with tip in SVC. No pulmonary edema. IMPRESSION: There is small right pleural effusion. Hazy atelectasis or infiltrate in right lower lobe. Stable tracheostomy tube position. NG feeding tube coiled within stomach with tip in proximal stomach. Stable right arm PICC line position with tip in SVC. No pulmonary edema. Electronically Signed   By: Lahoma Crocker M.D.   On: 10/19/2015 12:41   Dg Abd Portable 1v  10/19/2015  CLINICAL DATA:  Abdominal pain EXAM: PORTABLE ABDOMEN - 1 VIEW COMPARISON:  None. FINDINGS: Limited rotated abdominal radiograph, best obtainable due to patient condition. Feeding tube is looped over the stomach, tip not visualized  but present at the gastric fundus based on chest x-ray from earlier today. Nonobstructive bowel gas pattern. No evidence of intra-abdominal mass effect or calcification. IMPRESSION: Limited study with normal bowel gas pattern. Electronically Signed   By: Monte Fantasia M.D.   On: 10/19/2015 12:47      Assessment/Plan:  INTERVAL HISTORY:   10/10/15: patient with persistent fevers 10/11/15: PICC placed and now IJ removed 4/5 --10/13/15: still febrile, CT shows bilateral cavitating septic emboli areas, bilateral pleural effusions, Tracheal aspirate is growing GNR-- Moraxella  CT head shows Evolving, large subacute right MCA territory infarct with unchanged mass effect.  10/14/15: still febrile 10/15/15--> 10/20/15: sp thoracocentesis with pleural fluid c/w exudate 10/21/15: still sig diarrhea, her foot lesion appears more red and tender  Principal Problem:   Sepsis ([place]) secondary to endocarditis in an IV drug abuser Active Problems:   IV drug abuse   Acute renal failure (ARF) (HCC)   Thrombocytopenia (HCC)   Septic embolism (HCC)   Acute embolic stroke (HCC)   Prolonged Q-T interval on ECG   Multifocal Pneumonia, due to septic emboli   Anemia   Stroke (cerebrum) (HCC)   Pressure ulcer   Acute respiratory failure with hypoxia (HCC)   Altered mental status   Endotracheally intubated   AKI (acute kidney injury) ([place])   Brain abscess   PNA (pneumonia)   Septic shock (HCC)   Staphylococcus aureus bacteremia with sepsis ([place])   Acute respiratory failure ([place])   Encounter for orogastric (OG) tube placement   PICC (peripherally inserted central catheter) in place   Brainstem infarct, acute ([place])   Moraxella catarrhalis pneumonia ([place])   Encounter for feeding tube placement   Tracheostomy care ([place])   Pleural effusion   Encounter for imaging study to confirm nasogastric (NG) tube placement   Septic pulmonary embolism ([place])   Candiduria    Cassandra Wall is a 35 y.o. 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:  CENTRAL LINE IS OUT and PICC in place for now  She is narrowed back to NAFCILLIN and blood cultures are cleared  Neuro exam is improved   She will need a line "holiday" eventually given her difficult access issues could try to do this by DC all central lines and if no PIV can be placed treating her with oral zyvox for a few days, repeat blood cultures and then put in a new PICC after a holiday of at least 3 days with no central line  #2 Worsening cavitation in lungs with pleural effusions infiltrates: I think this is all SA driven  #4 Evolving CNS infarct: Neuro exam better  #5 Exudative pleural effusion: followup cultures defer further to CCM  #6 IVDU: enormous problem. Hopefully she survives to be able to then come to terms with this. She has very high mortality if she cannot completely stop this habit  #7 Chronic HCV without hepatic coma: Can try to cure her when she is outside the hospital. Medicaid would insist that she is drug free  #8 Candida in sputum: of no consequence unless as maker of esophagitis in which case would use either nystatin swish and swallow  vs parenteral echinocandin  #9 Funguria: her foley catheter has been removed  #10 QT prolongation: will need to be very careful with meds  #11 FOot lesion: would recommend bedside I and D of this area  Dr Megan Salon is available this weekend for any questions.    LOS: 17 days   Alcide Evener 10/21/2015, 11:22 AM

## 2015-10-21 NOTE — Evaluation (Signed)
Physical Therapy Evaluation Patient Details Name: Cassandra Wall MRN: [id number] DOB: 1981/03/23 Today's Date: 10/21/2015   History of Present Illness  35 y.o. female with IVDU history, recently in drug rehab but left, continued IVDU here with AMS, somnolence and now noted to have Staph aureus bacteremia, pulmonary septic emboli and septic cerebral emboli. TTE notes two vegetations on tricuspid valve, one on aortic valve. + fever, chills. 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 on 10/06/15, pt with increased agitation presumably due to withdrawal.  She was intubated.  Repeat MRI 4/1 showed evolving large Rt MCA infarct and evolving small bil occipital infarcts.  Chest CT 4/5 showed cavitary lesions bil. lungs and bil pleural effusions.  Tracheostomy 4/6.  Clinical Impression  Pt lethargic, but does attempt to participate and will nod her head yes/no in response to questions, but not consistently.  Pt was able to A with maintaining sitting balance during session and even participated in transfer to recliner.  Feel pt would benefit from CIR level of therapies pending continued progress medically.  Will continue to follow.      Follow Up Recommendations CIR    Equipment Recommendations  None recommended by PT    Recommendations for Other Services Rehab consult     Precautions / Restrictions Precautions Precautions: Fall Restrictions Weight Bearing Restrictions: No      Mobility  Bed Mobility Overal bed mobility: Needs Assistance;+2 for physical assistance Bed Mobility: Supine to Sit     Supine to sit: Max assist;+2 for physical assistance     General bed mobility comments: pt lethargic, but does seem to participate with bed mobility.    Transfers Overall transfer level: Needs assistance Equipment used: 2 person hand held assist Transfers: Squat Pivot Transfers     Squat pivot transfers: Max assist;+2 physical  assistance     General transfer comment: pt does participate in transfer weightbearing on R LE and following cues.    Ambulation/Gait                Stairs            Wheelchair Mobility    Modified Rankin (Stroke Patients Only)       Balance Overall balance assessment: Needs assistance Sitting-balance support: Feet supported Sitting balance-Leahy Scale: Poor Sitting balance - Comments: Initially requiring Max A to maintain balance, but did get to MinG briefly.  Worked on trunk extension and L cervical rotation and cervical extension.   Postural control: Posterior lean                                   Pertinent Vitals/Pain Pain Assessment: Faces Faces Pain Scale: Hurts even more Pain Location: Head and neck during mobility/ROM Pain Descriptors / Indicators: Grimacing Pain Intervention(s): Limited activity within patient's tolerance;Monitored during session;Repositioned    Home Living Family/patient expects to be discharged to:: Inpatient rehab                      Prior Function Level of Independence: Independent               Hand Dominance   Dominant Hand: Right    Extremity/Trunk Assessment   Upper Extremity Assessment: Defer to OT evaluation           Lower Extremity Assessment: LLE deficits/detail   LLE Deficits / Details: Only trace movement noted  in L LE.  Sensation appears at least diminished if not absent.  Cervical / Trunk Assessment: Other exceptions  Communication   Communication: Tracheostomy  Cognition Arousal/Alertness: Awake/alert Behavior During Therapy: Flat affect Overall Cognitive Status: Difficult to assess                 General Comments: Pt follows one step motor commands consistently.  She will nod yes/no inconsistently.  Appears to have some delayed responses     General Comments      Exercises        Assessment/Plan    PT Assessment Patient needs continued PT services   PT Diagnosis Difficulty walking;Hemiplegia non-dominant side   PT Problem List Decreased strength;Decreased activity tolerance;Decreased balance;Decreased mobility;Decreased coordination;Decreased cognition;Decreased knowledge of use of DME;Decreased safety awareness;Cardiopulmonary status limiting activity;Impaired sensation  PT Treatment Interventions DME instruction;Gait training;Functional mobility training;Therapeutic activities;Therapeutic exercise;Balance training;Neuromuscular re-education;Patient/family education   PT Goals (Current goals can be found in the Care Plan section) Acute Rehab PT Goals Patient Stated Goal: none stated PT Goal Formulation: Patient unable to participate in goal setting Time For Goal Achievement: 11/04/15 Potential to Achieve Goals: Good    Frequency Min 4X/week   Barriers to discharge        Co-evaluation PT/OT/SLP Co-Evaluation/Treatment: Yes Reason for Co-Treatment: Complexity of the patient's impairments (multi-system involvement);For patient/therapist safety PT goals addressed during session: Balance;Mobility/safety with mobility         End of Session Equipment Utilized During Treatment: Gait belt;Oxygen Activity Tolerance: Patient limited by fatigue Patient left: in chair;with call bell/phone within reach;with family/visitor present Nurse Communication: Mobility status;Need for lift equipment         Time: 1313-1400 PT Time Calculation (min) (ACUTE ONLY): 47 min   Charges:   PT Evaluation $PT Eval Moderate Complexity: 1 Procedure     PT G CodesSunny Schlein:        Karrin Eisenmenger F, [place] CarolinaPT [phone] 10/21/2015, 3:50 PM

## 2015-10-21 NOTE — Progress Notes (Signed)
80 cc's fentanyl from drip wasted in sink with Lilli FewLauren Robins, RN

## 2015-10-21 NOTE — Progress Notes (Signed)
Occupational Therapy Treatment Patient Details Name: Cassandra Wall MRN: [id number] DOB: 1981-04-27 Today's Date: 10/21/2015    History of present illness 35 y.o. female with IVDU history, recently in drug rehab but left, continued IVDU here with AMS, somnolence and now noted to have Staph aureus bacteremia, pulmonary septic emboli and septic cerebral emboli. TTE notes two vegetations on tricuspid valve, one on aortic valve. + fever, chills. 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 on 10/06/15, pt with increased agitation presumably due to withdrawal.  She was intubated.  Repeat MRI 4/1 showed evolving large Rt MCA infarct and evolving small bil occipital infarcts.  Chest CT 4/5 showed cavitary lesions bil. lungs and bil pleural effusions.  Tracheostomy 4/6.   OT comments  Pt with great motivation with therapies, but does fatigue.  She demonstrates poor head/neck and trunk control.  She was able to assist with squat pivot transfer to recliner, with ability to maintain some weight through the Lt LE.  Minimal spontaneous movement also noted Lt UE today.   Pt on trach colloar with 35% FIO2, sats in mid to high 90s.  BP 99/64  Follow Up Recommendations  SNF;Supervision/Assistance - 24 hour    Equipment Recommendations  None recommended by OT    Recommendations for Other Services      Precautions / Restrictions Precautions Precautions: Fall       Mobility Bed Mobility Overal bed mobility: Needs Assistance;+2 for physical assistance Bed Mobility: Supine to Sit     Supine to sit: Max assist;+2 for physical assistance     General bed mobility comments: pt lethargic, but does seem to participate with bed mobility.    Transfers Overall transfer level: Needs assistance Equipment used: 2 person hand held assist Transfers: Squat Pivot Transfers     Squat pivot transfers: Max assist;+2 physical assistance     General transfer  comment: pt does participate in transfer weightbearing on R LE and following cues.      Balance Overall balance assessment: Needs assistance Sitting-balance support: Feet supported Sitting balance-Leahy Scale: Poor Sitting balance - Comments: Initially requiring Max A to maintain balance, but did get to MinG briefly.  Worked on trunk extension and L cervical rotation and cervical extension.   Postural control: Posterior lean                         ADL Overall ADL's : Needs assistance/impaired     Grooming: Wash/dry face;Moderate assistance;Sitting                                        Vision                     Perception     Praxis      Cognition   Behavior During Therapy: Flat affect Overall Cognitive Status: Difficult to assess                  General Comments: Pt follows one step motor commands consistently.  She will nod yes/no inconsistently.  Appears to have some delayed responses     Extremity/Trunk Assessment               Exercises     Shoulder Instructions       General Comments      Pertinent Vitals/ Pain  Pain Assessment: Faces Faces Pain Scale: Hurts even more Pain Location: head and neck with ROM  Pain Descriptors / Indicators: Grimacing Pain Intervention(s): Limited activity within patient's tolerance;Monitored during session  Home Living                                          Prior Functioning/Environment              Frequency Min 3X/week     Progress Toward Goals  OT Goals(current goals can now be found in the care plan section)  Progress towards OT goals: Progressing toward goals  ADL Goals Pt Will Perform Grooming: with mod assist;sitting Additional ADL Goal #1: Pt will sit unsupported with mod A in prep for ADLs Additional ADL Goal #2: Pt will sustain attention to familiar ADL tasks for 10 mins with min cues Additional ADL Goal #3: Pt will participate  in 15 mins therapeutic activity to increase activity tolerance needed for ADLs.   Plan Discharge plan remains appropriate (if she continues to progress might be CIR candidate )    Co-evaluation    PT/OT/SLP Co-Evaluation/Treatment: Yes Reason for Co-Treatment: Complexity of the patient's impairments (multi-system involvement);For patient/therapist safety   OT goals addressed during session: Strengthening/ROM      End of Session Equipment Utilized During Treatment: Oxygen   Activity Tolerance Patient tolerated treatment well   Patient Left in chair;with call bell/phone within reach;with family/visitor present   Nurse Communication Mobility status;Need for lift equipment        Time: 1313-1402 OT Time Calculation (min): 49 min  Charges: OT General Charges $OT Visit: 1 Procedure OT Treatments $Neuromuscular Re-education: 23-37 mins  Cassandra Wall M 10/21/2015, 8:11 PM

## 2015-10-22 DIAGNOSIS — Z430 Encounter for attention to tracheostomy: Secondary | ICD-10-CM

## 2015-10-22 LAB — BASIC METABOLIC PANEL
ANION GAP: 9 (ref 5–15)
BUN: 9 mg/dL (ref 6–20)
CALCIUM: 8.6 mg/dL — AB (ref 8.9–10.3)
CO2: 32 mmol/L (ref 22–32)
Chloride: 99 mmol/L — ABNORMAL LOW (ref 101–111)
Creatinine, Ser: 0.47 mg/dL (ref 0.44–1.00)
GFR calc Af Amer: >60 mL/min (ref >60)
GLUCOSE: 103 mg/dL — AB (ref 65–99)
Potassium: 4.3 mmol/L (ref 3.5–5.1)
Sodium: 140 mmol/L (ref 135–145)

## 2015-10-22 LAB — GLUCOSE, CAPILLARY
GLUCOSE-CAPILLARY: 98 mg/dL (ref 65–99)
Glucose-Capillary: 105 mg/dL — ABNORMAL HIGH (ref 65–99)
Glucose-Capillary: 84 mg/dL (ref 65–99)
Glucose-Capillary: 90 mg/dL (ref 65–99)
Glucose-Capillary: 91 mg/dL (ref 65–99)
Glucose-Capillary: 93 mg/dL (ref 65–99)

## 2015-10-22 LAB — MAGNESIUM: MAGNESIUM: 2.1 mg/dL (ref 1.7–2.4)

## 2015-10-22 LAB — CBC
HCT: 23.1 % — ABNORMAL LOW (ref 36.0–46.0)
Hemoglobin: 7.1 g/dL — ABNORMAL LOW (ref 12.0–15.0)
MCH: 27.2 pg (ref 26.0–34.0)
MCHC: 30.7 g/dL (ref 30.0–36.0)
MCV: 88.5 fL (ref 78.0–100.0)
PLATELETS: 250 10*3/uL (ref 150–400)
RBC: 2.61 MIL/uL — AB (ref 3.87–5.11)
RDW: 16 % — ABNORMAL HIGH (ref 11.5–15.5)
WBC: 5.3 10*3/uL (ref 4.0–10.5)

## 2015-10-22 NOTE — Progress Notes (Signed)
PULMONARY / CRITICAL CARE MEDICINE Daily progress note   Name: Cassandra ChouSamantha Wall MRN: [id number] DOB: 06-20-1981    ADMISSION DATE:  10/04/2015 CONSULTATION DATE:  10/06/15  REFERRING MD:  Patel,P  CHIEF COMPLAINT: Hypertensive urgency related to opioid withdrawl  HISTORY OF PRESENT ILLNESS:  Cassandra Wall is a 35 year old female with past medical history significant for polysubstance abuse(iv heroine and cocaine), tobacco abuse who presented to the ED with complaints of left sided weakness which was going on for two days prior to the admission.  CT of the brain was concerning for right sided infarct She was evaluated by neurology and they think it is septic emboli from IVDU. She has track marks all over her body. From her note it appears that her brother passed away with endocarditis due to IVDU. Rt side endocarditis.  SUBJECTIVE:  Awake and alert, responds to voice and following commands Agitation improved, fentanyl is off, precedex at 1  VITAL SIGNS: BP 83/53 mmHg  Pulse 94  Temp(Src) 98.9 F (37.2 C) (Oral)  Resp 21  Ht 5\' 8"  (1.727 m)  Wt 61.3 kg (135 lb 2.3 oz)  BMI 20.55 kg/m2  SpO2 100%  LMP  (LMP Unknown)  HEMODYNAMICS:   VENTILATOR SETTINGS: Vent Mode:  [-]  FiO2 (%):  [25 %-40 %] 35 %  INTAKE / OUTPUT: I/O last 3 completed shifts: In: 4303.5 [I.V.:964.5; NG/GT:2589; IV Piggyback:750] Out: 5600 [Urine:5000; Emesis/NG output:600]  PHYSICAL EXAMINATION: General:  Awake Neuro: Moving all extremities- mental status improved today and following commands HEENT:  Atraumatic, normocephalic, Tracheostomy in place, on ATC Cardiovascular: regular, tachycardiac, 2/6 systolic murmur Lungs:  Coarse bilaterally Abdomen:  Soft, nontender Musculoskeletal:  Moving all extremities Skin: track marks all over the body, SCDs present, ~2cm area of erythema and swelling on dorsum of right foot remains fluctuant   LABS:  BMET  Recent Labs Lab 10/21/15 0600  10/21/15 1817 10/22/15 0415  NA 141 138 140  K 3.4* 4.3 4.3  CL 103 102 99*  CO2 31 28 32  BUN 8 9 9   CREATININE 0.39* 0.45 0.47  GLUCOSE 100* 98 103*    Electrolytes  Recent Labs Lab 10/17/15 0405  10/18/15 0444  10/19/15 0345  10/20/15 0520 10/21/15 0600 10/21/15 1817 10/22/15 0415  CALCIUM  --   < >  --   < > 7.9*  < > 7.9* 7.8* 8.0* 8.6*  MG  --   < >  --   < >  --   < > 1.9 1.7  --  2.1  PHOS 2.9  --  3.2  --  2.9  --   --   --   --   --   < > = values in this interval not displayed.  CBC  Recent Labs Lab 10/19/15 0345 10/20/15 0520 10/22/15 0415  WBC 4.9 4.9 5.3  HGB 7.0* 7.1* 7.1*  HCT 23.6* 22.8* 23.1*  PLT 223 219 250    Coag's No results for input(s): APTT, INR in the last 168 hours.  Sepsis Markers No results for input(s): LATICACIDVEN, PROCALCITON, O2SATVEN in the last 168 hours.  ABG No results for input(s): PHART, PCO2ART, PO2ART in the last 168 hours.  Liver Enzymes No results for input(s): AST, ALT, ALKPHOS, BILITOT, ALBUMIN in the last 168 hours.  Cardiac Enzymes No results for input(s): TROPONINI, PROBNP in the last 168 hours.  Glucose  Recent Labs Lab 10/21/15 1618 10/21/15 2025 10/21/15 2340 10/22/15 0322 10/22/15 0859 10/22/15 1232  GLUCAP 98 106* 93 105* 98 84    Imaging No results found.Chest Xray- 4/11- remarkably worse on the right today   STUDIES:  4/12 pAbd XR: Normal bowel gas pattern 4/11 pCXR: pulmonary vascular congestion, patchy consolidation of bilateral bases and bilateral pleural effusions R>L 4/5 Chest CT W Contrast- Cavitary lesions- largest- on left - 31mm and on right- 36mm, bilat pleural effusions. 4/5 Head Ct with Contrast- Evolving, large subacute right MCA territory infarct with unchanged mass effect. 2. Small occipital infarcts better demonstrated on MRI.  4/1 MRI brain w Wo contrast- Large acute right MCA infarct, without hemorrhagic conversion or propagation. Evolving small bilateral occipital  lobe infarcts. MRA HEAD: Mildly motion degraded examination. Acute RIGHT distal M1 occlusion consistent with embolic phenomena. 3/29 US renal . No hydronephrosis. 2. Increased parenchymal echogenicity bilaterally compatible with chronic medical renal disease. 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 No significant right-to-left shift or hemorrhagic transformation.BILATERAL occipital lobe hypodensities, subcentimeter size, could represent brain abscesses (versus acute subcortical infarcts). Consider continued surveillance with post infusion MRI. 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 EF of 50-55%  CULTURES: 3/28 BC > mssa 3/28 CSF-normal 3/30>>>  Staph aureus-MSSA again 4/3  B/c>>> FINAL No growth. 4/4 tracheal Cultures >>> Moraxella Final. 4/11 BCx>>NGTD 4/11 UCx>> Yeast 4/12 Right pleural effusion>> NGTD   ANTIBIOTICS: Naficillin 3/30>>> 4/6 Meropenem 4/6 >> 4/10 Resumed NAfcillin 4/10 >>  SIGNIFICANT EVENTS: 3/28 admitted 3/30 intubated. PCCM consult 4/4- Picc inserted, Central line removed. 4/6 Antibiotics changed from Nafcillin to Meropenem to broaden coverage to include Gram Neg and Anaerobes 4/6 Tracheostomy 4/10- Meropenem d/c, narrowed back to Nafcillin 4/12 Thoracentesis>> exudative  LINES/TUBES: 3/30 ETT>>> 4/6 3/30 Central line >>>out 4/4 Picc 4/4 >>  Tracheostomy 4/6 >> 4/13 foley >>  DISCUSSION: 35 year old female with h/o polysubstance abuse now presents with right sided septic emboli ddue to IVDU. Major issues are debilitation s/p CVA, agitation, NAG metabolic acidosis.This has improved. We will cont supportive care. Off precedex. Still spiking fevers. Head and chest Ct chest 4/5- evolving large infarct and chest Ct with bilat. effusions. Meropenem 5 day course given, and then narrowed back to Nafcillin for MSSA.  ASSESSMENT /  PLAN:  PULMONARY A: Acute Hypoxemic respiratory failure 2/2 > Multifocal PNA due to septic emboli, with cavitary lesions Pulm edema>> resolved Concern for G(-) PNA but rpt cultures with Moraxella Exudative Pleural Effusion s/p Thoracentesis P:   Continue ATC ad lib, goal 24x7 Try to keep fluids even with-  IV lasix 20 BID if further hypotension hold dose. 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 Monitor Cr  Daily Bmet and MAg Goal K- >4, Mag >2. Will go ahead and schedule potassium daily x 7 days  GASTROINTESTINAL A:   ?mesentric ischemia possibly due to IVDU Anemia likely from sepsis Abdominal Pain- appears improved on exam Loose  stools/diarrrhea- C diff neg P:   tolerating feeds pepcid BID for PUD > possible gastritis.  NG tube  HEMATOLOGIC A:   Anemia, possible GIB/gastritis and sepsis- 7.1- 4/13 Thrombocytopenia r/t sepsis - Resolved  P:  scds for DVT prophylaxis Heparin sq held 2/2 anemia Monitor Hb and Hct.  Goal Hct >7 transfuse if less  INFECTIOUS A:   MSSA endocarditis with septic emboli.  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  Encephalopathy  Heroine withdrawal  Intermittent Agitation Large right MCA infarct Opioid dependence P:   RASS goal: 0 Wean off precedex drip.  Methadone  BID to help wean fentanyl seroquel (monitor QTc) Cont clonopin  FAMILY  - Updates: Updated mother at bedside 4/14   Levy Pupa, MD, PhD 10/22/2015, 1:29 PM [facility] [phone] or if no answer [phone]

## 2015-10-22 NOTE — Progress Notes (Signed)
   10/22/15 1500  Clinical Encounter Type  Visited With Patient and family together  Visit Type Spiritual support  Referral From Nurse  Spiritual Encounters  Spiritual Needs Prayer  Stress Factors  Patient Stress Factors Health changes  Family Stress Factors Health changes;Loss of control  Chaplain called by nurse on 62M to either find a Catholic priest to provide a blessing or to come say a prayer. Left message for Catholic priest but am doubtful that on Easter weekend one will come short of an emergency. Sunday will be anniversary of death of patient's 34 year old brother, so nurse and I prayed together for him, for patient, and for mother. Cassandra Wall, Chaplain

## 2015-10-23 ENCOUNTER — Inpatient Hospital Stay (HOSPITAL_COMMUNITY): Payer: Medicaid Other

## 2015-10-23 LAB — GLUCOSE, CAPILLARY
GLUCOSE-CAPILLARY: 101 mg/dL — AB (ref 65–99)
GLUCOSE-CAPILLARY: 97 mg/dL (ref 65–99)
Glucose-Capillary: 95 mg/dL (ref 65–99)
Glucose-Capillary: 86 mg/dL (ref 65–99)
Glucose-Capillary: 81 mg/dL (ref 65–99)

## 2015-10-23 LAB — CBC
HEMATOCRIT: 26.6 % — AB (ref 36.0–46.0)
Hemoglobin: 8 g/dL — ABNORMAL LOW (ref 12.0–15.0)
MCH: 26.3 pg (ref 26.0–34.0)
MCHC: 30.1 g/dL (ref 30.0–36.0)
MCV: 87.5 fL (ref 78.0–100.0)
PLATELETS: 291 10*3/uL (ref 150–400)
RBC: 3.04 MIL/uL — ABNORMAL LOW (ref 3.87–5.11)
RDW: 16.2 % — AB (ref 11.5–15.5)
WBC: 5.7 10*3/uL (ref 4.0–10.5)

## 2015-10-23 LAB — BASIC METABOLIC PANEL
ANION GAP: 9 (ref 5–15)
BUN: 11 mg/dL (ref 6–20)
CALCIUM: 8.7 mg/dL — AB (ref 8.9–10.3)
CO2: 31 mmol/L (ref 22–32)
Chloride: 97 mmol/L — ABNORMAL LOW (ref 101–111)
Creatinine, Ser: 0.55 mg/dL (ref 0.44–1.00)
Glucose, Bld: 116 mg/dL — ABNORMAL HIGH (ref 65–99)
Potassium: 4.2 mmol/L (ref 3.5–5.1)
Sodium: 137 mmol/L (ref 135–145)

## 2015-10-23 LAB — CULTURE, BLOOD (ROUTINE X 2)
Culture: NO GROWTH
Culture: NO GROWTH

## 2015-10-23 LAB — ADENOSIDE DEAMINASE, PLEURAL FL: ADENOSIDE DEAMINASE, PLEURAL FL: 2.2 U/L (ref 0.0–9.4)

## 2015-10-23 MED ORDER — METHADONE HCL 10 MG PO TABS
30.0000 mg | ORAL_TABLET | Freq: Two times a day (BID) | ORAL | Status: DC
  Administered 2015-10-23 – 2015-10-24 (×3): 30 mg
  Filled 2015-10-23 (×3): qty 3

## 2015-10-23 MED ORDER — ALTEPLASE 2 MG IJ SOLR
2.0000 mg | Freq: Once | INTRAMUSCULAR | Status: AC
  Administered 2015-10-23: 2 mg
  Filled 2015-10-23: qty 2

## 2015-10-23 MED ORDER — SODIUM CHLORIDE 0.9% FLUSH
10.0000 mL | Freq: Two times a day (BID) | INTRAVENOUS | Status: DC
  Administered 2015-10-24 – 2015-10-26 (×4): 10 mL

## 2015-10-23 MED ORDER — SODIUM CHLORIDE 0.9% FLUSH: 10.0000 mL | INTRAVENOUS | Status: DC | PRN

## 2015-10-23 NOTE — Progress Notes (Signed)
PULMONARY / CRITICAL CARE MEDICINE Daily progress note   Name: Kamyiah Colantonio MRN: [id number] DOB: 1981-05-26    ADMISSION DATE:  10/04/2015 CONSULTATION DATE:  10/06/15  REFERRING MD:  Patel,P  CHIEF COMPLAINT: Hypertensive urgency related to opioid withdrawl  HISTORY OF PRESENT ILLNESS:  SAVANNAH MORFORD is a 35 year old female with past medical history significant for polysubstance abuse(iv heroine and cocaine), tobacco abuse who presented to the ED with complaints of left sided weakness which was going on for two days prior to the admission.  CT of the brain was concerning for right sided infarct She was evaluated by neurology and they think it is septic emboli from IVDU. She has track marks all over her body. From her note it appears that her brother passed away with endocarditis due to IVDU. Rt side endocarditis.  SUBJECTIVE:  Sedated  VITAL SIGNS: BP 107/65 mmHg  Pulse 117  Temp(Src) 98.8 F (37.1 C) (Oral)  Resp 19  Ht  (1.727 m)  Wt 58.378 kg (128 lb 11.2 oz)  BMI 19.57 kg/m2  SpO2 95%  LMP  (LMP Unknown)  HEMODYNAMICS:   VENTILATOR SETTINGS: Vent Mode:  [-]  FiO2 (%):  [28 %-35 %] 28 %  INTAKE / OUTPUT: I/O last 3 completed shifts: In: 3520.2 [I.V.:900.2; NG/GT:2220; IV Piggyback:400] Out: 5050 [Urine:5050]  PHYSICAL EXAMINATION: General:  sedated Neuro: Moving all extremities HEENT:  Atraumatic, normocephalic, Tracheostomy in place, on ATC Cardiovascular: regular, tachycardiac, 2/6 systolic murmur Lungs:  Coarse bilaterally Abdomen:  Soft, nontender Musculoskeletal:  Moving all extremities Skin: track marks all over the body, SCDs present  LABS:  BMET  Recent Labs Lab 10/21/15 1817 10/22/15 0415 10/23/15 0400  NA 138 140 137  K 4.3 4.3 4.2  CL 102 99* 97*  CO2 28 32 31  BUN CREATININE 0.45 0.47 0.55  GLUCOSE 98 103* 116*    Electrolytes  Recent Labs Lab 10/17/15 0405  10/18/15 0444  10/19/15 0345   10/20/15 0520 10/21/15 0600 10/21/15 1817 10/22/15 0415 10/23/15 0400  CALCIUM  --   < >  --   < > 7.9*  < > 7.9* 7.8* 8.0* 8.6* 8.7*  MG  --   < >  --   < >  --   < > 1.9 1.7  --  2.1  --   PHOS 2.9  --  3.2  --  2.9  --   --   --   --   --   --   < > = values in this interval not displayed.  CBC  Recent Labs Lab 10/20/15 0520 10/22/15 0415 10/23/15 0400  WBC 4.9 5.3 5.7  HGB 7.1* 7.1* 8.0*  HCT 22.8* 23.1* 26.6*  PLT 219 250 291    Coag's No results for input(s): APTT, INR in the last 168 hours.  Sepsis Markers No results for input(s): LATICACIDVEN, PROCALCITON, O2SATVEN in the last 168 hours.  ABG No results for input(s): PHART, PCO2ART, PO2ART in the last 168 hours.  Liver Enzymes No results for input(s): AST, ALT, ALKPHOS, BILITOT, ALBUMIN in the last 168 hours.  Cardiac Enzymes No results for input(s): TROPONINI, PROBNP in the last 168 hours.  Glucose  Recent Labs Lab 10/22/15 1232 10/22/15 1621 10/22/15 2008 10/23/15 0045 10/23/15 0410 10/23/15 0733  GLUCAP 84 91 90 95 101* 97    Imaging Dg Chest Port 1 View  10/23/2015  CLINICAL DATA:  35 year old female with respiratory failure. EXAM: PORTABLE CHEST  1 VIEW COMPARISON:  Chest x-ray 10/19/2015. FINDINGS: A tracheostomy tube is in place with tip 3.7 cm above the carina. There is a right upper extremity PICC with tip terminating in the distal superior vena cava. Feeding tube extending into the stomach with tip in the region of the fundus. Lung volumes are low. Compared to the prior study there is significantly worsened aeration throughout the mid to lower lungs bilaterally where there is now extensive opacities completely obscuring the diaphragms. Increasing small to moderate bilateral pleural effusions. Mild crowding of the pulmonary vasculature, likely accentuated by low lung volumes, without frank pulmonary edema. Heart size appears borderline enlarged, accentuated by low lung volumes. Upper mediastinal  contours are within normal limits. IMPRESSION: 1. Support apparatus, as above. 2. Low lung volumes with bibasilar areas of atelectasis and/or airspace consolidation and enlarging small to moderate bilateral pleural effusions. Electronically Signed   By: Trudie Reed M.D.   On: 10/23/2015 08:11  Chest Xray- 4/11- remarkably worse on the right today   STUDIES:  4/16 pCXR: bibasilar atelectasis with small bilateral pleural effusions 4/12 pAbd XR: Normal bowel gas pattern 4/11 pCXR: pulmonary vascular congestion, patchy consolidation of bilateral bases and bilateral pleural effusions R>L 4/5 Chest CT W Contrast- Cavitary lesions- largest- on left - 31mm and on right- 36mm, bilat pleural effusions. 4/5 Head Ct with Contrast- Evolving, large subacute right MCA territory infarct with unchanged mass effect. 2. Small occipital infarcts better demonstrated on MRI.  4/1 MRI brain w Wo contrast- Large acute right MCA infarct, without hemorrhagic conversion or propagation. Evolving small bilateral occipital lobe infarcts. MRA HEAD: Mildly motion degraded examination. Acute RIGHT distal M1 occlusion consistent with embolic phenomena. 3/29 US renal . No hydronephrosis. 2. Increased parenchymal echogenicity bilaterally compatible with chronic medical renal disease. 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 No significant right-to-left shift or hemorrhagic transformation.BILATERAL occipital lobe hypodensities, subcentimeter size, could represent brain abscesses (versus acute subcortical infarcts). Consider continued surveillance with post infusion MRI. 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 EF of 50-55%  CULTURES: 3/28 BC > mssa 3/28 CSF-normal 3/30>>>  Staph aureus-MSSA again 4/3  B/c>>> FINAL No growth. 4/4 tracheal Cultures >>> Moraxella Final. 4/11 BCx>>NGTD 4/11 UCx>> Yeast 4/12  Right pleural effusion>> NGTD   ANTIBIOTICS: Naficillin 3/30>>> 4/6 Meropenem 4/6 >> 4/10 Resumed NAfcillin 4/10 >>  SIGNIFICANT EVENTS: 3/28 admitted 3/30 intubated. PCCM consult 4/4- Picc inserted, Central line removed. 4/6 Antibiotics changed from Nafcillin to Meropenem to broaden coverage to include Gram Neg and Anaerobes 4/6 Tracheostomy 4/10- Meropenem d/c, narrowed back to Nafcillin 4/12 Thoracentesis>> exudative  LINES/TUBES: 3/30 ETT>>> 4/6 3/30 Central line >>>out 4/4 Picc 4/4 >>  Tracheostomy 4/6 >> 4/13 foley >>  DISCUSSION: 35 year old female with h/o polysubstance abuse now presents with right sided septic emboli ddue to IVDU. Major issues are debilitation s/p CVA, agitation, NAG metabolic acidosis.This has improved. We will cont supportive care. Off precedex. Still spiking fevers. Head and chest Ct chest 4/5- evolving large infarct and chest Ct with bilat. effusions. Meropenem 5 day course given, and then narrowed back to Nafcillin for MSSA.  ASSESSMENT / PLAN:  PULMONARY A: Acute Hypoxemic respiratory failure 2/2 > Multifocal PNA due to septic emboli, with cavitary lesions Pulm edema>> resolved Concern for G(-) PNA but rpt cultures with Moraxella Exudative Pleural Effusion s/p Thoracentesis Atelectasis P:   Continue ATC ad lib, goal 24x7 Try to  keep fluids even with-  IV lasix 20 BID if further hypotension hold dose. Cont abx- now narrowed to nafcillin Follow cultures from pleural effusion  SLP eval 4/16 for cuff down, swallowing Worsening airspace on CXR 4/16 likely reflective of atelectasis off positive pressure Can transfer to step down until on 4/16 PCCM to remain attending but [facility] remains stable.  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 Monitor Cr  Daily Bmet and MAg Goal K- >4, Mag >2.  40mEq potassium daily  GASTROINTESTINAL A:   ?mesentric ischemia possibly due to IVDU Anemia likely from sepsis Abdominal Pain- appears improved on exam Loose stools/diarrrhea- C diff neg P:   tolerating feeds pepcid BID for PUD > possible gastritis.  NG tube  HEMATOLOGIC A:   Anemia, possible GIB/gastritis and sepsis- 7.1- 4/13 Thrombocytopenia r/t sepsis - Resolved  P:  scds for DVT prophylaxis Heparin sq held 2/2 anemia Monitor Hb and Hct.  Goal Hct >7 transfuse if less  INFECTIOUS A:   MSSA endocarditis with septic emboli.  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  Encephalopathy  Heroine withdrawal  Intermittent Agitation Large right MCA infarct Opioid dependence P:   RASS goal: 0 Wean off precedex drip.  Methadone 20mg  BID, will increase to 30mg  BID if QT allows. seroquel (monitor QTc) Cont clonopin  FAMILY  - Updates: Updated mother at bedside 4/14   Levy Pupaobert Byrum, MD, PhD 10/23/2015, 11:03 AM  Pulmonary and Critical Care [phone] or if no answer [phone]

## 2015-10-23 NOTE — Progress Notes (Signed)
eLink Physician-Brief Progress Note Patient Name: Cassandra Wall DOB: 11-10-1980 MRN: [id number]   Date of Service  10/23/2015  HPI/Events of Note  Patient safety  eICU Interventions  Renewal of bilateral soft wrist restraints for patient safety     Intervention Category Major Interventions: Other:  [name] 10/23/2015, 1:46 AM

## 2015-10-24 ENCOUNTER — Inpatient Hospital Stay (HOSPITAL_COMMUNITY): Payer: Medicaid Other

## 2015-10-24 DIAGNOSIS — L03116 Cellulitis of left lower limb: Secondary | ICD-10-CM

## 2015-10-24 LAB — BASIC METABOLIC PANEL
ANION GAP: 11 (ref 5–15)
BUN: 13 mg/dL (ref 6–20)
CO2: 29 mmol/L (ref 22–32)
Calcium: 8.7 mg/dL — ABNORMAL LOW (ref 8.9–10.3)
Chloride: 97 mmol/L — ABNORMAL LOW (ref 101–111)
Creatinine, Ser: 0.55 mg/dL (ref 0.44–1.00)
Glucose, Bld: 107 mg/dL — ABNORMAL HIGH (ref 65–99)
POTASSIUM: 3.9 mmol/L (ref 3.5–5.1)
Sodium: 137 mmol/L (ref 135–145)

## 2015-10-24 LAB — MAGNESIUM
MAGNESIUM: 1.9 mg/dL (ref 1.7–2.4)
MAGNESIUM: 2.4 mg/dL (ref 1.7–2.4)
Magnesium: 1.8 mg/dL (ref 1.7–2.4)

## 2015-10-24 LAB — CBC
HEMATOCRIT: 28.4 % — AB (ref 36.0–46.0)
HEMOGLOBIN: 8.5 g/dL — AB (ref 12.0–15.0)
MCH: 25.8 pg — ABNORMAL LOW (ref 26.0–34.0)
MCHC: 29.9 g/dL — ABNORMAL LOW (ref 30.0–36.0)
MCV: 86.3 fL (ref 78.0–100.0)
Platelets: 406 10*3/uL — ABNORMAL HIGH (ref 150–400)
RBC: 3.29 MIL/uL — ABNORMAL LOW (ref 3.87–5.11)
RDW: 16.1 % — AB (ref 11.5–15.5)
WBC: 8.7 10*3/uL (ref 4.0–10.5)

## 2015-10-24 LAB — CULTURE, BODY FLUID-BOTTLE

## 2015-10-24 LAB — GLUCOSE, CAPILLARY
GLUCOSE-CAPILLARY: 102 mg/dL — AB (ref 65–99)
GLUCOSE-CAPILLARY: 85 mg/dL (ref 65–99)
GLUCOSE-CAPILLARY: 91 mg/dL (ref 65–99)
Glucose-Capillary: 89 mg/dL (ref 65–99)
Glucose-Capillary: 96 mg/dL (ref 65–99)
Glucose-Capillary: 117 mg/dL — ABNORMAL HIGH (ref 65–99)
Glucose-Capillary: 97 mg/dL (ref 65–99)

## 2015-10-24 LAB — CULTURE, BODY FLUID W GRAM STAIN -BOTTLE: Culture: NO GROWTH

## 2015-10-24 LAB — PHOSPHORUS
PHOSPHORUS: 24.8 mg/dL — AB (ref 2.5–4.6)
PHOSPHORUS: 5 mg/dL — AB (ref 2.5–4.6)

## 2015-10-24 LAB — TROPONIN I: Troponin I: <0.03 ng/mL (ref <0.031)

## 2015-10-24 MED ORDER — MAGNESIUM SULFATE 2 GM/50ML IV SOLN
2.0000 g | Freq: Once | INTRAVENOUS | Status: AC
  Administered 2015-10-24: 2 g via INTRAVENOUS
  Filled 2015-10-24: qty 50

## 2015-10-24 MED ORDER — LORAZEPAM 2 MG/ML IJ SOLN
0.5000 mg | Freq: Once | INTRAMUSCULAR | Status: AC
  Administered 2015-10-24: 0.5 mg via INTRAVENOUS
  Filled 2015-10-24: qty 1

## 2015-10-24 MED ORDER — PRO-STAT SUGAR FREE PO LIQD
30.0000 mL | Freq: Two times a day (BID) | ORAL | Status: DC
  Administered 2015-10-24: 30 mL
  Filled 2015-10-24: qty 30

## 2015-10-24 MED ORDER — CETYLPYRIDINIUM CHLORIDE 0.05 % MT LIQD
7.0000 mL | Freq: Two times a day (BID) | OROMUCOSAL | Status: DC
  Administered 2015-10-25 – 2015-10-26 (×3): 7 mL via OROMUCOSAL

## 2015-10-24 MED ORDER — LORAZEPAM 2 MG/ML IJ SOLN
INTRAMUSCULAR | Status: AC
  Filled 2015-10-24: qty 1

## 2015-10-24 MED ORDER — METOPROLOL TARTRATE 25 MG PO TABS
25.0000 mg | ORAL_TABLET | Freq: Two times a day (BID) | ORAL | Status: DC
  Administered 2015-10-24 – 2015-10-26 (×5): 25 mg via ORAL
  Filled 2015-10-24 (×5): qty 1

## 2015-10-24 MED ORDER — LORAZEPAM 2 MG/ML IJ SOLN
2.0000 mg | INTRAMUSCULAR | Status: DC | PRN
Start: 2015-10-24 — End: 2015-10-26
  Administered 2015-10-24 – 2015-10-26 (×5): 2 mg via INTRAVENOUS
  Filled 2015-10-24 (×4): qty 1

## 2015-10-24 MED ORDER — HEPARIN SODIUM (PORCINE) 5000 UNIT/ML IJ SOLN
5000.0000 [IU] | Freq: Three times a day (TID) | INTRAMUSCULAR | Status: DC
  Administered 2015-10-24 – 2015-10-26 (×7): 5000 [IU] via SUBCUTANEOUS
  Filled 2015-10-24 (×8): qty 1

## 2015-10-24 MED ORDER — CHLORHEXIDINE GLUCONATE 0.12 % MT SOLN
15.0000 mL | Freq: Two times a day (BID) | OROMUCOSAL | Status: DC
  Administered 2015-10-24 – 2015-10-26 (×4): 15 mL via OROMUCOSAL

## 2015-10-24 MED ORDER — METOPROLOL TARTRATE 1 MG/ML IV SOLN
5.0000 mg | Freq: Four times a day (QID) | INTRAVENOUS | Status: DC
  Administered 2015-10-24: 5 mg via INTRAVENOUS
  Filled 2015-10-24 (×2): qty 5

## 2015-10-24 MED ORDER — VITAL AF 1.2 CAL PO LIQD
1000.0000 mL | ORAL | Status: DC
  Administered 2015-10-24 – 2015-10-25 (×3): 1000 mL
  Filled 2015-10-24 (×3): qty 1000

## 2015-10-24 MED ORDER — VITAL HIGH PROTEIN PO LIQD
1000.0000 mL | ORAL | Status: DC
  Administered 2015-10-24: 1000 mL

## 2015-10-24 NOTE — Progress Notes (Signed)
eLink Physician-Brief Progress Note Patient Name: Cassandra Wall DOB: [birth year] MRN: [id number]   Date of Service  10/24/2015  HPI/Events of Note  Anxiety, sinus tachycardia after precedex was weaned off.  eICU Interventions  Recheck EKG, ativan PRN     Intervention Category Minor Interventions: Agitation / anxiety - evaluation and management  Cassandra Wall 10/24/2015, 4:39 AM

## 2015-10-24 NOTE — Progress Notes (Addendum)
Patient has been agitated and restless trying to pull tubes inspite of rt. Arm restraint and bilateral mittens. Patient remains tachycardic after precidex was discontinued and patient more awake and able to communicate by writing but orientation still questionable.  Having episode of coughing spells and sounding upper airway congestion  , respiratory suctioned patient and lavage multiples times able to obtained secretions. Maintaining her O2 sats, Dr. Andrey CampanileWilson made aware with orders made ativan 0.5mg . IV given. Will continue to monitor.
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
## 2015-10-24 NOTE — Progress Notes (Signed)
12 lead EKG done prior to EKG patient pointed to her chest is hurting, Dr. Andrey CampanileWilson made aware  Will follow up this am with rounding doctors.

## 2015-10-24 NOTE — Progress Notes (Addendum)
Nutrition Follow-up  DOCUMENTATION CODES:   Underweight  INTERVENTION:    Resume TF via Cortrak tube with Vital AF 1.2 at goal rate of 65 ml/h (1560 ml per day) to provide 1872 kcals, 117 gm protein, 1265 ml free water daily.  NUTRITION DIAGNOSIS:   Inadequate oral intake related to inability to eat as evidenced by NPO status.  Ongoing  GOAL:   Patient will meet greater than or equal to 90% of their needs  Progressing  MONITOR:   TF tolerance, Labs, Skin, I & O's, Diet advancement  REASON FOR ASSESSMENT:   Consult Enteral/tube feeding initiation and management  ASSESSMENT:   35 y.o. Female with h/o seizures, polysubstance abuse (IV heroin and cocaine) and tobacco abuse who presented to ED with L side weakness. Mother endorsed pt has had psychosis in past. 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 Additional foci of acute nonhemorrhagic infarct involves R occipital lobe, L occipital parietal lobe, in L cerebellum. CXR 3/28 patchy bilateral airspace opacities raise concern for multifocal pneumonia  Patient has been on TF (Vital AF 1.2 at 60 ml/h) via Cortrak tube, which patient pulled out this AM. Cortrak tube has been replaced.  Received MD Consult for TF initiation and management. TF has been resumed with Vital High Protein at 25 ml/h. SLP working with patient on PMSV use. ? swallow evaluation soon. Currently on trach collar.   Diet Order:  Diet NPO time specified  Skin:  Wound (see comment) (DTI to L & R heels)  Last BM:  4/17  Height:   Ht Readings from Last 1 Encounters:  10/04/15 5\' 8"  (1.727 m)    Weight:   Wt Readings from Last 1 Encounters:  10/24/15 123 lb 11.2 oz (56.11 kg)    Ideal Body Weight:  63.6 kg  BMI:  Body mass index is 18.81 kg/(m^2).  Estimated Nutritional Needs:   Kcal:  1800-2000  Protein:  80-100 gm  Fluid:  1.8-2 L  EDUCATION NEEDS:   No education needs identified at this  time  Joaquin CourtsKimberly Dacota Devall, RD, LDN, CNSC Pager [phone] After Hours Pager [phone]

## 2015-10-24 NOTE — Progress Notes (Signed)
PULMONARY / CRITICAL CARE MEDICINE Daily progress note   Name: Cassandra ChouSamantha XXXSpradley MRN: [id number] DOB: [birth year]    ADMISSION DATE:  10/04/2015 CONSULTATION DATE:  10/06/15  REFERRING MD:  Patel,P  CHIEF COMPLAINT: Hypertensive urgency related to opioid withdrawl  HISTORY OF PRESENT ILLNESS:  Cassandra RobertsSamantha XXX Wall is a 35 year old female with past medical history significant for polysubstance abuse(iv heroine and cocaine), tobacco abuse who presented to the ED with complaints of left sided weakness which was going on for two days prior to the admission.  CT of the brain was concerning for right sided infarct She was evaluated by neurology and they think it is septic emboli from IVDU. She has track marks all over her body. From her note it appears that her brother passed away with endocarditis due to IVDU. Rt side endocarditis.  SUBJECTIVE:  Restless in bed, no current complaints but complaints of anxiety and chest pain earlier this morning.  VITAL SIGNS: BP 134/71 mmHg  Pulse 133  Temp(Src) 99.3 F (37.4 C) (Oral)  Resp 31  Ht 5\' 8"  (1.727 m)  Wt 56.11 kg (123 lb 11.2 oz)  BMI 18.81 kg/m2  SpO2 98%  LMP  (LMP Unknown)  HEMODYNAMICS:   VENTILATOR SETTINGS: Vent Mode:  [-]  FiO2 (%):  [28 %] 28 %  INTAKE / OUTPUT: I/O last 3 completed shifts: In: 3148.3 [I.V.:647.3; NG/GT:1701; IV Piggyback:800] Out: 5840 [Urine:5840]  PHYSICAL EXAMINATION: General:  Awake, alert, can mouth answers to questions Neuro: Moving all extremities HEENT:  Atraumatic, normocephalic, Tracheostomy in place, on ATC Cardiovascular: regular, tachycardiac, 2/6 systolic murmur Lungs:  Coarse bilaterally Abdomen:  Soft, nontender Musculoskeletal:  No edema Skin: track marks all over the body, SCDs present  LABS:  BMET  Recent Labs Lab 10/22/15 0415 10/23/15 0400 10/24/15 0615  NA 140 137 137  K 4.3 4.2 3.9  CL 99* 97* 97*  CO2 32 31 29  BUN 9 11 13   CREATININE 0.47 0.55 0.55  GLUCOSE  103* 116* 107*    Electrolytes  Recent Labs Lab 10/18/15 0444  10/19/15 0345  10/21/15 0600  10/22/15 0415 10/23/15 0400 10/24/15 0615  CALCIUM  --   < > 7.9*  < > 7.8*  < > 8.6* 8.7* 8.7*  MG  --   < >  --   < > 1.7  --  2.1  --  1.9  PHOS 3.2  --  2.9  --   --   --   --   --   --   < > = values in this interval not displayed.  CBC  Recent Labs Lab 10/22/15 0415 10/23/15 0400 10/24/15 0615  WBC 5.3 5.7 8.7  HGB 7.1* 8.0* 8.5*  HCT 23.1* 26.6* 28.4*  PLT 250 291 406*    Coag's No results for input(s): APTT, INR in the last 168 hours.  Sepsis Markers No results for input(s): LATICACIDVEN, PROCALCITON, O2SATVEN in the last 168 hours.  ABG No results for input(s): PHART, PCO2ART, PO2ART in the last 168 hours.  Liver Enzymes No results for input(s): AST, ALT, ALKPHOS, BILITOT, ALBUMIN in the last 168 hours.  Cardiac Enzymes  Recent Labs Lab 10/24/15 0616  TROPONINI <0.03    Glucose  Recent Labs Lab 10/23/15 0733 10/23/15 1240 10/23/15 1629 10/23/15 2016 10/24/15 0015 10/24/15 0401  GLUCAP 97 86 81 85 91 89    Imaging Dg Abd Portable 1v  10/23/2015  CLINICAL DATA:  Nasogastric tube placement. EXAM: PORTABLE ABDOMEN - 1  VIEW COMPARISON:  10/19/2015 FINDINGS: Tip and side port of the enteric tube below the diaphragm in the stomach. There is a normal bowel gas pattern. External artifact projects over the left hip. IMPRESSION: Tip and side port of the enteric tube below the diaphragm in the stomach. Electronically Signed   By: Rubye Oaks M.D.   On: 10/23/2015 18:00  Chest Xray- 4/11- remarkably worse on the right today   STUDIES:  4/16 pCXR: bibasilar atelectasis with small bilateral pleural effusions 4/12 pAbd XR: Normal bowel gas pattern 4/11 pCXR: pulmonary vascular congestion, patchy consolidation of bilateral bases and bilateral pleural effusions R>L 4/5 Chest CT W Contrast- Cavitary lesions- largest- on left - 31mm and on right- 36mm, bilat  pleural effusions. 4/5 Head Ct with Contrast- Evolving, large subacute right MCA territory infarct with unchanged mass effect. 2. Small occipital infarcts better demonstrated on MRI.  4/1 MRI brain w Wo contrast- Large acute right MCA infarct, without hemorrhagic conversion or propagation. Evolving small bilateral occipital lobe infarcts. MRA HEAD: Mildly motion degraded examination. Acute RIGHT distal M1 occlusion consistent with embolic phenomena. 3/29 US renal . No hydronephrosis. 2. Increased parenchymal echogenicity bilaterally compatible with chronic medical renal disease. 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 No significant right-to-left shift or hemorrhagic transformation.BILATERAL occipital lobe hypodensities, subcentimeter size, could represent brain abscesses (versus acute subcortical infarcts). Consider continued surveillance with post infusion MRI. 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 EF of 50-55%  CULTURES: 3/28 BC > mssa 3/28 CSF-normal 3/30>>>  Staph aureus-MSSA again 4/3  B/c>>> FINAL No growth. 4/4 tracheal Cultures >>> Moraxella Final. 4/11 BCx>>NGTD 4/11 UCx>> Yeast 4/12 Right pleural effusion>> NGTD   ANTIBIOTICS: Naficillin 3/30>>> 4/6 Meropenem 4/6 >> 4/10 Resumed NAfcillin 4/10 >>  SIGNIFICANT EVENTS: 3/28 admitted 3/30 intubated. PCCM consult 4/4- Picc inserted, Central line removed. 4/6 Antibiotics changed from Nafcillin to Meropenem to broaden coverage to include Gram Neg and Anaerobes 4/6 Tracheostomy 4/10- Meropenem d/c, narrowed back to Nafcillin 4/12 Thoracentesis>> exudative  LINES/TUBES: 3/30 ETT>>> 4/6 3/30 Central line >>>out 4/4 Picc 4/4 >>  Tracheostomy (df) 4/6 >> 4/13 foley >>  DISCUSSION: 35 year old female with h/o polysubstance abuse now presents with right sided septic emboli ddue to IVDU. Major issues  are debilitation s/p CVA, agitation, NAG metabolic acidosis.This has improved. We will cont supportive care. Off precedex. Still spiking fevers. Head and chest Ct chest 4/5- evolving large infarct and chest Ct with bilat. effusions. Meropenem 5 day course given, and then narrowed back to Nafcillin for MSSA.  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  potassium daily  GASTROINTESTINAL A:   ?mesentric ischemia possibly due to IVDU Anemia likely from sepsis Abdominal Pain- appears improved on exam Loose stools/diarrrhea- C diff neg P:   tolerating feeds pepcid BID for PUD > possible gastritis.  NG tube today by cortrak  HEMATOLOGIC A:   Anemia, dilution  Thrombocytopenia r/t sepsis - Resolved DVT prevention P:  scds for DVT prophylaxis Heparin sq ensure Monitor Hb and Hct.  Goal Hct >7 transfuse if less  INFECTIOUS A:   MSSA endocarditis with septic emboli.  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  Encephalopathy  Heroine withdrawal  Intermittent Agitation Large right MCA infarct Opioid dependence P:   Now off Precedex drip - dc this  Methadone   BID, low threshold to utilize q6h seroquel (monitor QTc) Cont clonopin  FAMILY  - Updates: father participated in family presence on rounds   Gust Rung, DO IMTS PGY-3  10/24/2015, 7:28 AM [facility] [phone] or if no answer [phone]   STAFF NOTE: I, Rory Percy, MD FACP have personally reviewed patient's available data, including medical history, events of note, physical examination and test results as part of my evaluation. I have discussed with resident/NP and other care providers such as pharmacist, RN and RRT. In addition, I personally evaluated patient and elicited key findings of: awake, some agitation,judt got cortrak placed, drop cuff as strong cough small to mod secretions, PMV attempted, may need to get to 4 soon, slp repeat failed, place feeding tube, restart TF, needs pt, add sub q hep, continued trach collar, seems she is a success to abx for endocarditis, will d/w ID, allow acyclovir to dc today, dc precedex, may go to q6h methadone, qtc daily with Seroquel also, i updated dad on rounds add oral BB, to sdu awaited   Mcarthur Rossetti. Tyson Alias, MD, FACP Pgr: [phone] [facility] 10/24/2015 10:12 AM

## 2015-10-24 NOTE — Progress Notes (Signed)
RN informed RT that pt was desating into the 80's. RT changed inner cannula, inflated cuff, and suctioned pt with no improvement. RT increased FiO2 and did CPT through the bed, pt sats then came up and RT is now weaning O2 back down per pt tolerance. RT to monitor

## 2015-10-24 NOTE — Progress Notes (Signed)
Patient continue to be agitated and tachycardic but maintaining O2 sats.  Dr. Isaiah SergeMannam  Made aware and with orders made. Ativan 2mg . IV given. Will further continue to monitor patient.

## 2015-10-24 NOTE — Progress Notes (Signed)
Occupational Therapy Treatment Patient Details Name: Cassandra Wall MRN: [id number] DOB: 10-Sep-1980 Today's Date: 10/24/2015    History of present illness 35 y.o. female with IVDU history, recently in drug rehab but left, continued IVDU here with AMS, somnolence and now noted to have Staph aureus bacteremia, pulmonary septic emboli and septic cerebral emboli. TTE notes two vegetations on tricuspid valve, one on aortic valve. + fever, chills. 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 on 10/06/15, pt with increased agitation presumably due to withdrawal.  She was intubated.  Repeat MRI 4/1 showed evolving large Rt MCA infarct and evolving small bil occipital infarcts.  Chest CT 4/5 showed cavitary lesions bil. lungs and bil pleural effusions.  Tracheostomy 4/6.   OT comments  Pt tolerated > 15 minutes eob with fatigue at the end of session falling asleep. Pt with medications provided just prior to therapy arrival. Question if medications contributing to fatigue this session. Pt with HR 123 oxygen 82% 28% trach collar with cuff deflated upon initial sitting EOB. Pt after ~ 2 minutes able to recover with oxygen levels > 90%. Pt demonstrates activity movement on L UE to attempt to don socks. Pt using R UE and R LE when asked to initiate movement with L side.    Follow Up Recommendations  SNF;Supervision/Assistance - 24 hour    Equipment Recommendations  None recommended by OT    Recommendations for Other Services      Precautions / Restrictions Precautions Precautions: Fall Precaution Comments: trach, foley       Mobility Bed Mobility Overal bed mobility: Needs Assistance Bed Mobility: Supine to Sit;Sit to Supine     Supine to sit: +2 for physical assistance;Mod assist Sit to supine: +2 for physical assistance;Mod assist   General bed mobility comments: A for bil Le to progress on and off bed. pt needed max cues to attend to L side  to exit bed. pt turning head and looking at pillow to descend back into bed  Transfers Overall transfer level: Needs assistance   Transfers: Sit to/from Stand Sit to Stand: +2 physical assistance;Max assist         General transfer comment: Pt requires (A) for hip extension, neck neutral and extension. pt with uncontrolled oral secretions. pt requires bil LE blocked to prevent knee buckle. Pt unable to tolerate lateral weight shift at this time    Balance Overall balance assessment: Needs assistance Sitting-balance support: Bilateral upper extremity supported;Feet supported Sitting balance-Leahy Scale: Poor   Postural control: Left lateral lean                         ADL Overall ADL's : Needs assistance/impaired     Grooming: Wash/dry face;Minimal assistance;Sitting Grooming Details (indicate cue type and reason): pt wiping nose because its running             Lower Body Dressing: Maximal assistance Lower Body Dressing Details (indicate cue type and reason): Pt attempting to don with R Ue. pt unable to successful hook sock without (A). pt moving L UE during task. pt able to cross bil LE to help (A) task                General ADL Comments: Session focused on EOB sitting balance and postural alignment. Pt with posterior pelvic tilt and L lateral lean. pt with R neck rotation preference. pt needed cues to visually scan L and (A) to  hold head in neutral alignment.       Vision                 Additional Comments: pt noted to have nystagmus during session in static sitting   Perception     Praxis      Cognition   Behavior During Therapy: Flat affect Overall Cognitive Status: Difficult to assess                       Extremity/Trunk Assessment               Exercises     Shoulder Instructions       General Comments      Pertinent Vitals/ Pain       Pain Assessment: No/denies pain  Home Living                                           Prior Functioning/Environment              Frequency Min 3X/week     Progress Toward Goals  OT Goals(current goals can now be found in the care plan section)  Progress towards OT goals: Progressing toward goals  Acute Rehab OT Goals Patient Stated Goal: none stated OT Goal Formulation: Patient unable to participate in goal setting Potential to Achieve Goals: Fair ADL Goals Pt Will Perform Grooming: with mod assist;sitting Additional ADL Goal #1: Pt will sit unsupported with mod A in prep for ADLs Additional ADL Goal #2: Pt will sustain attention to familiar ADL tasks for 10 mins with min cues Additional ADL Goal #3: Pt will participate in 15 mins therapeutic activity to increase activity tolerance needed for ADLs.   Plan Discharge plan remains appropriate    Co-evaluation    PT/OT/SLP Co-Evaluation/Treatment: Yes Reason for Co-Treatment: Complexity of the patient's impairments (multi-system involvement);For patient/therapist safety   OT goals addressed during session: ADL's and self-care;Strengthening/ROM      End of Session Equipment Utilized During Treatment: Oxygen   Activity Tolerance Patient tolerated treatment well   Patient Left in bed;with call bell/phone within reach;with bed alarm set   Nurse Communication Mobility status;Precautions        Time: [id number] OT Time Calculation (min): 38 min  Charges: OT General Charges $OT Visit: 1 Procedure OT Treatments $Therapeutic Activity: 8-22 mins  Boone MasterJones, Shaheen Star B 10/24/2015, 4:02 PM  Mateo FlowJones, Brynn   OTR/L Pager: [phone] Office: [phone] .

## 2015-10-24 NOTE — Progress Notes (Signed)
INFECTIOUS DISEASE PROGRESS NOTE  ID: Cassandra Wall is a 35 y.o. female with  Principal Problem:   Sepsis (HCC) secondary to endocarditis in an IV drug abuser Active Problems:   IV drug abuse   Acute renal failure (ARF) (HCC)   Thrombocytopenia (HCC)   Septic embolism (HCC)   Acute embolic stroke (HCC)   Prolonged Q-T interval on ECG   Multifocal Pneumonia, due to septic emboli   Anemia   Stroke (cerebrum) (HCC)   Pressure ulcer   Acute respiratory failure with hypoxia (HCC)   Altered mental status   Endotracheally intubated   AKI (acute kidney injury) (HCC)   Brain abscess   PNA (pneumonia)   Septic shock (HCC)   Staphylococcus aureus bacteremia with sepsis (HCC)   Acute respiratory failure (HCC)   Encounter for orogastric (OG) tube placement   PICC (peripherally inserted central catheter) in place   Brainstem infarct, acute (HCC)   Moraxella catarrhalis pneumonia (HCC)   Encounter for feeding tube placement   Tracheostomy care (HCC)   Pleural effusion   Encounter for imaging study to confirm nasogastric (NG) tube placement   Septic pulmonary embolism (HCC)   Candiduria   Cellulitis and abscess of leg, except foot  Subjective: Awake and alert, expressive issues  Abtx:  Anti-infectives    Start     Dose/Rate Route Frequency Ordered Stop   10/18/15 1700  acyclovir (ZOVIRAX) 200 MG/5ML suspension SUSP 400 mg     400 mg Oral 3 times per day 10/18/15 1631     10/18/15 1200  nafcillin 2 g in dextrose 5 % 100 mL IVPB     2 g 200 mL/hr over 30 Minutes Intravenous 6 times per day 10/18/15 1102     10/17/15 1600  nafcillin injection 2 g  Status:  Discontinued     2 g Intravenous Every 4 hours 10/17/15 1413 10/17/15 1520   10/17/15 1500  nafcillin 2 g in dextrose 5 % 100 mL IVPB  Status:  Discontinued     2 g 200 mL/hr over 30 Minutes Intravenous 4 times per day 10/17/15 1520 10/18/15 1102   10/13/15 1400  meropenem (MERREM) 2 g in sodium chloride 0.9 % 100 mL  IVPB  Status:  Discontinued     2 g 200 mL/hr over 30 Minutes Intravenous 3 times per day 10/13/15 0946 10/17/15 1426   10/06/15 1200  ceFAZolin (ANCEF) IVPB 2g/100 mL premix  Status:  Discontinued    Comments:  Pharmacy may adjust   2 g 200 mL/hr over 30 Minutes Intravenous 3 times per day 10/06/15 1019 10/06/15 1019   10/06/15 1200  nafcillin 2 g in dextrose 5 % 100 mL IVPB  Status:  Discontinued     2 g 200 mL/hr over 30 Minutes Intravenous 6 times per day 10/06/15 1042 10/13/15 0946   10/06/15 1100  nafcillin injection 2 g  Status:  Discontinued    Comments:  Pharmacy may adjust   2 g Intravenous Every 4 hours 10/06/15 1019 10/06/15 1042   10/05/15 1100  ceFAZolin (ANCEF) IVPB 2g/100 mL premix  Status:  Discontinued    Comments:  Pharmacy may adjust   2 g 200 mL/hr over 30 Minutes Intravenous Every 12 hours 10/05/15 0959 10/06/15 1019   10/05/15 0500  ceFEPIme (MAXIPIME) 2 g in dextrose 5 % 50 mL IVPB  Status:  Discontinued     2 g 100 mL/hr over 30 Minutes Intravenous Every 24 hours 10/04/15 1444 10/05/15 0959  10/04/15 1215  ampicillin (OMNIPEN) 2 g in sodium chloride 0.9 % 50 mL IVPB     2 g 150 mL/hr over 20 Minutes Intravenous NOW 10/04/15 1208 10/04/15 1305   10/04/15 0530  ceFEPIme (MAXIPIME) 2 g in dextrose 5 % 50 mL IVPB     2 g 100 mL/hr over 30 Minutes Intravenous  Once 10/04/15 0529 10/04/15 0612   10/04/15 0445  cefTRIAXone (ROCEPHIN) 2 g in dextrose 5 % 50 mL IVPB     2 g 100 mL/hr over 30 Minutes Intravenous  Once 10/04/15 0441 10/04/15 0529   10/04/15 0445  vancomycin (VANCOCIN) 2,000 mg in sodium chloride 0.9 % 500 mL IVPB     2,000 mg 250 mL/hr over 120 Minutes Intravenous  Once 10/04/15 0441 10/04/15 1234   10/04/15 0445  ampicillin (OMNIPEN) 2 g in sodium chloride 0.9 % 50 mL IVPB  Status:  Discontinued     2 g 150 mL/hr over 20 Minutes Intravenous  Once 10/04/15 0441 10/04/15 0532      Medications:  Prior to Admission:  No prescriptions prior to  admission    Objective: Vital signs in last 24 hours: Temp:  [98.3 F (36.8 C)-99.3 F (37.4 C)] 99.3 F (37.4 C) (04/17 0829) Pulse Rate:  [89-136] 119 (04/17 0812) Resp:  [15-38] 26 (04/17 0812) BP: (82-137)/(48-79) 114/74 mmHg (04/17 0812) SpO2:  [95 %-100 %] 98 % (04/17 0812) FiO2 (%):  [28 %] 28 % (04/17 0812) Weight:  [56.11 kg (123 lb 11.2 oz)] 56.11 kg (123 lb 11.2 oz) (04/17 0600)   General appearance: alert, cooperative and no distress Resp: tachypnea Cardio: tachycardia GI: normal findings: bowel sounds normal and soft, non-tender Extremities: edema none and RLE wound is clean, no fluctuance, no erythema.   Lab Results  Recent Labs  10/23/15 0400 10/24/15 0615  WBC 5.7 8.7  HGB 8.0* 8.5*  HCT 26.6* 28.4*  NA 137 137  K 4.2 3.9  CL 97* 97*  CO2 31 29  BUN 11 13  CREATININE 0.55 0.55   Liver Panel No results for input(s): PROT, ALBUMIN, AST, ALT, ALKPHOS, BILITOT, BILIDIR, IBILI in the last 72 hours. Sedimentation Rate No results for input(s): ESRSEDRATE in the last 72 hours. C-Reactive Protein No results for input(s): CRP in the last 72 hours.  Microbiology: Recent Results (from the past 240 hour(s))  Culture, Urine     Status: Abnormal   Collection Time: 10/16/15 11:15 AM  Result Value Ref Range Status   Specimen Description URINE, CATHETERIZED  Final   Special Requests NONE  Final   Culture >=100,000 COLONIES/mL YEAST (A)  Final   Report Status 10/17/2015 FINAL  Final  Culture, blood (routine x 2)     Status: None   Collection Time: 10/18/15  1:54 PM  Result Value Ref Range Status   Specimen Description BLOOD LEFT ARM  Final   Special Requests IN PEDIATRIC BOTTLE 2CC  Final   Culture NO GROWTH 5 DAYS  Final   Report Status 10/23/2015 FINAL  Final  Culture, respiratory (NON-Expectorated)     Status: None   Collection Time: 10/18/15  2:10 PM  Result Value Ref Range Status   Specimen Description TRACHEAL ASPIRATE  Final   Special Requests  NONE  Final   Gram Stain   Final    FEW WBC PRESENT, PREDOMINANTLY PMN RARE SQUAMOUS EPITHELIAL CELLS PRESENT RARE YEAST RARE GRAM POSITIVE COCCI IN PAIRS Performed at [facility]  Final    FEW YEAST CONSISTENT WITH CANDIDA SPECIES Performed at [facility]    Report Status 10/20/2015 FINAL  Final  Urine culture     Status: Abnormal   Collection Time: 10/18/15  2:35 PM  Result Value Ref Range Status   Specimen Description URINE, RANDOM  Final   Special Requests NONE  Final   Culture >=100,000 COLONIES/mL YEAST (A)  Final   Report Status 10/19/2015 FINAL  Final  Culture, blood (routine x 2)     Status: None   Collection Time: 10/18/15  5:22 PM  Result Value Ref Range Status   Specimen Description BLOOD LEFT ANTECUBITAL  Final   Special Requests IN PEDIATRIC BOTTLE 1CC  Final   Culture NO GROWTH 5 DAYS  Final   Report Status 10/23/2015 FINAL  Final  C difficile quick scan w PCR reflex     Status: None   Collection Time: 10/19/15 10:25 AM  Result Value Ref Range Status   C Diff antigen NEGATIVE NEGATIVE Final   C Diff toxin NEGATIVE NEGATIVE Final   C Diff interpretation Negative for toxigenic C. difficile  Final  Gram stain     Status: None   Collection Time: 10/19/15  1:53 PM  Result Value Ref Range Status   Specimen Description FLUID RIGHT PLEURAL  Final   Special Requests NONE  Final   Gram Stain   Final    MODERATE WBC PRESENT,BOTH PMN AND MONONUCLEAR NO ORGANISMS SEEN    Report Status 10/19/2015 FINAL  Final  Culture, body fluid-bottle     Status: None (Preliminary result)   Collection Time: 10/19/15  1:53 PM  Result Value Ref Range Status   Specimen Description FLUID RIGHT PLEURAL  Final   Special Requests NONE  Final   Culture NO GROWTH 4 DAYS  Final   Report Status PENDING  Incomplete    Studies/Results: Dg Chest Port 1 View  10/23/2015  CLINICAL DATA:  35 year old female with respiratory failure. EXAM: PORTABLE CHEST 1 VIEW  COMPARISON:  Chest x-ray 10/19/2015. FINDINGS: A tracheostomy tube is in place with tip 3.7 cm above the carina. There is a right upper extremity PICC with tip terminating in the distal superior vena cava. Feeding tube extending into the stomach with tip in the region of the fundus. Lung volumes are low. Compared to the prior study there is significantly worsened aeration throughout the mid to lower lungs bilaterally where there is now extensive opacities completely obscuring the diaphragms. Increasing small to moderate bilateral pleural effusions. Mild crowding of the pulmonary vasculature, likely accentuated by low lung volumes, without frank pulmonary edema. Heart size appears borderline enlarged, accentuated by low lung volumes. Upper mediastinal contours are within normal limits. IMPRESSION: 1. Support apparatus, as above. 2. Low lung volumes with bibasilar areas of atelectasis and/or airspace consolidation and enlarging small to moderate bilateral pleural effusions. Electronically Signed   By: Trudie Reed M.D.   On: 10/23/2015 08:11   Dg Abd Portable 1v  10/23/2015  CLINICAL DATA:  Nasogastric tube placement. EXAM: PORTABLE ABDOMEN - 1 VIEW COMPARISON:  10/19/2015 FINDINGS: Tip and side port of the enteric tube below the diaphragm in the stomach. There is a normal bowel gas pattern. External artifact projects over the left hip. IMPRESSION: Tip and side port of the enteric tube below the diaphragm in the stomach. Electronically Signed   By: Rubye Oaks M.D.   On: 10/23/2015 18:00     Assessment/Plan: MSSA IE (TV, AV) CNS, Pulmonary  Septic Infarcts Cellulitis, septic lesion LLE VDRF --> trach collar  IVDA Hepatitis C  Total days of antibiotics: 18 (Nafcillin)  Temps resolved over weekend Repeat BCx 4-11 negative Would continue her nafcillin for 6 weeks.  Could consider checking her Hep C genotype but she is a poor treatment candidate  She will need placement, long term Available  as needed.          Johny SaxJeffrey Hatcher Infectious Diseases (pager) [phone] [email or web address] 10/24/2015, 9:36 AM  LOS: 20 days

## 2015-10-24 NOTE — Progress Notes (Signed)
Physical Therapy Treatment Patient Details Name: Cassandra ChouSamantha Wall MRN: [id number] DOB: 03-Nov-1980 Today's Date: 10/24/2015    History of Present Illness 35 y.o. female with IVDU history, recently in drug rehab but left, continued IVDU here with AMS, somnolence and now noted to have Staph aureus bacteremia, pulmonary septic emboli and septic cerebral emboli. TTE notes two vegetations on tricuspid valve, one on aortic valve. + fever, chills. 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 on 10/06/15, pt with increased agitation presumably due to withdrawal.  She was intubated.  Repeat MRI 4/1 showed evolving large Rt MCA infarct and evolving small bil occipital infarcts.  Chest CT 4/5 showed cavitary lesions bil. lungs and bil pleural effusions.  Tracheostomy 4/6.    PT Comments    Patient progressing with sitting balance and standing this session.  Still benefits from 2 sets of skilled hands due to decreased L side awareness, poor initiation of movement, L trunk and extremity weakness and some central cerebellar dysfunction.  Will benefit from skilled PT in an interdisciplinary environment on d/c to maximize independence and safety prior to d/c home with assist.  Follow Up Recommendations  CIR     Equipment Recommendations  None recommended by PT    Recommendations for Other Services       Precautions / Restrictions Precautions Precautions: Fall Precaution Comments: trach, foley Restrictions Weight Bearing Restrictions: No    Mobility  Bed Mobility Overal bed mobility: Needs Assistance Bed Mobility: Supine to Sit;Sit to Supine     Supine to sit: +2 for physical assistance;Mod assist Sit to supine: +2 for physical assistance;Mod assist   General bed mobility comments: A for bil Le to progress on and off bed. pt needed max cues to attend to L side to exit bed. pt turning head and looking at pillow to descend back into  bed  Transfers Overall transfer level: Needs assistance   Transfers: Sit to/from Stand Sit to Stand: +2 physical assistance;Max assist         General transfer comment: Pt requires (A) for hip extension, neck neutral and extension. pt with uncontrolled oral secretions. pt requires bil LE blocked to prevent knee buckle. Pt unable to tolerate lateral weight shift at this time  Ambulation/Gait                 Stairs            Wheelchair Mobility    Modified Rankin (Stroke Patients Only)       Balance Overall balance assessment: Needs assistance Sitting-balance support: Feet supported Sitting balance-Leahy Scale: Poor Sitting balance - Comments: sat EOB with posterior support and assist of another at times for ant pelvic tilt and upright posture with R weight shift due to pushing L Postural control: Left lateral lean Standing balance support: Bilateral upper extremity supported Standing balance-Leahy Scale: Zero Standing balance comment: +2 A to stand x about 1 minute with facilitation at hips for extension and blocking knees to prevent buckling, with weight shifting buckles at knees                    Cognition Arousal/Alertness: Awake/alert Behavior During Therapy: WFL for tasks assessed/performed Overall Cognitive Status: Difficult to assess                      Exercises Other Exercises Other Exercises: Cervical stretching to L in sitting    General Comments General comments (skin integrity,  edema, etc.): c/o dizziness in sitting; noted L beat nystagmus with L gaze, also dysconjugate gaze with looking R and superior      Pertinent Vitals/Pain Pain Assessment: No/denies pain Faces Pain Scale: No hurt    Home Living                      Prior Function            PT Goals (current goals can now be found in the care plan section) Acute Rehab PT Goals Patient Stated Goal: none stated Progress towards PT goals: Progressing  toward goals    Frequency  Min 4X/week    PT Plan Current plan remains appropriate    Co-evaluation PT/OT/SLP Co-Evaluation/Treatment: Yes Reason for Co-Treatment: Complexity of the patient's impairments (multi-system involvement);For patient/therapist safety PT goals addressed during session: Mobility/safety with mobility;Balance;Strengthening/ROM OT goals addressed during session: ADL's and self-care;Strengthening/ROM     End of Session Equipment Utilized During Treatment: Gait belt Activity Tolerance: Patient limited by fatigue Patient left: in bed;with call bell/phone within reach     Time: 1140-1218 PT Time Calculation (min) (ACUTE ONLY): 38 min  Charges:  $Therapeutic Activity: 8-22 mins                    G Codes:      Elray Mcgregor 06-Nov-2015, 5:11 PM  Sheran Lawless, PT [phone] November 06, 2015

## 2015-10-25 ENCOUNTER — Inpatient Hospital Stay (HOSPITAL_COMMUNITY): Payer: Medicaid Other

## 2015-10-25 DIAGNOSIS — G8194 Hemiplegia, unspecified affecting left nondominant side: Secondary | ICD-10-CM

## 2015-10-25 LAB — GLUCOSE, CAPILLARY
GLUCOSE-CAPILLARY: 102 mg/dL — AB (ref 65–99)
GLUCOSE-CAPILLARY: 80 mg/dL (ref 65–99)
GLUCOSE-CAPILLARY: 92 mg/dL (ref 65–99)
GLUCOSE-CAPILLARY: 93 mg/dL (ref 65–99)
GLUCOSE-CAPILLARY: 99 mg/dL (ref 65–99)
Glucose-Capillary: 111 mg/dL — ABNORMAL HIGH (ref 65–99)

## 2015-10-25 LAB — BASIC METABOLIC PANEL
ANION GAP: 10 (ref 5–15)
Anion gap: 8 (ref 5–15)
BUN: 15 mg/dL (ref 6–20)
BUN: 14 mg/dL (ref 6–20)
CALCIUM: 8.7 mg/dL — AB (ref 8.9–10.3)
CHLORIDE: 99 mmol/L — AB (ref 101–111)
CHLORIDE: 103 mmol/L (ref 101–111)
CO2: 32 mmol/L (ref 22–32)
CO2: 30 mmol/L (ref 22–32)
CREATININE: 0.5 mg/dL (ref 0.44–1.00)
Calcium: 8.7 mg/dL — ABNORMAL LOW (ref 8.9–10.3)
Creatinine, Ser: 0.65 mg/dL (ref 0.44–1.00)
GFR calc non Af Amer: >60 mL/min (ref >60)
Glucose, Bld: 97 mg/dL (ref 65–99)
Glucose, Bld: 97 mg/dL (ref 65–99)
POTASSIUM: 3.5 mmol/L (ref 3.5–5.1)
Potassium: 4.2 mmol/L (ref 3.5–5.1)
SODIUM: 141 mmol/L (ref 135–145)
SODIUM: 141 mmol/L (ref 135–145)

## 2015-10-25 LAB — PHOSPHORUS
PHOSPHORUS: 4.9 mg/dL — AB (ref 2.5–4.6)
Phosphorus: 4.1 mg/dL (ref 2.5–4.6)

## 2015-10-25 LAB — MAGNESIUM
MAGNESIUM: 2.4 mg/dL (ref 1.7–2.4)
Magnesium: 2.1 mg/dL (ref 1.7–2.4)

## 2015-10-25 MED ORDER — METHADONE HCL 10 MG PO TABS
20.0000 mg | ORAL_TABLET | Freq: Four times a day (QID) | ORAL | Status: DC
  Administered 2015-10-25 – 2015-10-26 (×5): 20 mg
  Filled 2015-10-25 (×5): qty 2

## 2015-10-25 MED ORDER — POTASSIUM CHLORIDE 20 MEQ/15ML (10%) PO SOLN
40.0000 meq | Freq: Once | ORAL | Status: AC
  Administered 2015-10-25: 40 meq via ORAL

## 2015-10-25 NOTE — Progress Notes (Signed)
PULMONARY / CRITICAL CARE MEDICINE Daily progress note   Name: Cassandra Wall MRN: [id number] DOB: 04/10/1981    ADMISSION DATE:  10/04/2015 CONSULTATION DATE:  10/06/15  REFERRING MD:  Patel,P  CHIEF COMPLAINT: Hypertensive urgency related to opioid withdrawl  HISTORY OF PRESENT ILLNESS:  Cassandra Wall is a 35 year old female with past medical history significant for polysubstance abuse(iv heroine and cocaine), tobacco abuse who presented to the ED with complaints of left sided weakness which was going on for two days prior to the admission.  CT of the brain was concerning for right sided infarct She was evaluated by neurology and they think it is septic emboli from IVDU. She has track marks all over her body. From her note it appears that her brother passed away with endocarditis due to IVDU. Rt side endocarditis.  SUBJECTIVE:  Restless in bed, with complaints of anxiety Mild hypoxia with cuff down  VITAL SIGNS: BP 128/106 mmHg  Pulse 120  Temp(Src) 98.6 F (37 C) (Oral)  Resp 42  Ht  (1.727 m)  Wt 54.3 kg (119 lb 11.4 oz)  BMI 18.21 kg/m2  SpO2 97%  LMP  (LMP Unknown)  HEMODYNAMICS:   VENTILATOR SETTINGS: Vent Mode:  [-]  FiO2 (%):  [28 %-98 %] 28 %  INTAKE / OUTPUT: I/O last 3 completed shifts: In: 3311.1 [I.V.:297.7; NG/GT:2163.4; IV Piggyback:850] Out: 4820 [Urine:4820]  PHYSICAL EXAMINATION: General:  Awake, alert, can mouth answers to questions Neuro: Moving all extremities HEENT:  Atraumatic, normocephalic, Tracheostomy in place, on ATC Cardiovascular: regular, tachycardiac, 2/6 systolic murmur Lungs:  Coarse bilaterally Abdomen:  Soft, nontender Musculoskeletal:  No edema, mittens in place over hands Skin: diaphoretic, track marks all over the body, SCDs present  LABS:  BMET  Recent Labs Lab 10/23/15 0400 10/24/15 0615 10/25/15 0411  NA 137 137 141  K 4.2 3.9 3.5  CL 97* 97* 99*  CO2 31 29 32  BUN CREATININE 0.55  0.55 0.65  GLUCOSE 116* 107* 97    Electrolytes  Recent Labs Lab 10/23/15 0400 10/24/15 0615 10/24/15 1209 10/24/15 2115 10/25/15 0411  CALCIUM 8.7* 8.7*  --   --  8.7*  MG  --  1.9 1.8 2.4 2.4  PHOS  --   --  24.8* 5.0* 4.9*    CBC  Recent Labs Lab 10/22/15 0415 10/23/15 0400 10/24/15 0615  WBC 5.3 5.7 8.7  HGB 7.1* 8.0* 8.5*  HCT 23.1* 26.6* 28.4*  PLT 250 291 406*    Coag's No results for input(s): APTT, INR in the last 168 hours.  Sepsis Markers No results for input(s): LATICACIDVEN, PROCALCITON, O2SATVEN in the last 168 hours.  ABG No results for input(s): PHART, PCO2ART, PO2ART in the last 168 hours.  Liver Enzymes No results for input(s): AST, ALT, ALKPHOS, BILITOT, ALBUMIN in the last 168 hours.  Cardiac Enzymes  Recent Labs Lab 10/24/15 0616  TROPONINI <0.03    Glucose  Recent Labs Lab 10/24/15 0831 10/24/15 1225 10/24/15 1640 10/24/15 2043 10/25/15 10/25/15 0352  GLUCAP 96 117* 102* 97 99 93    Imaging Dg Abd Portable 1v  10/24/2015  CLINICAL DATA:  Enteric feeding tube placement. EXAM: PORTABLE ABDOMEN - 1 VIEW COMPARISON:  10/23/2015 FINDINGS: The nasogastric tube has been removed. An enteric feeding tube passes below the diaphragm. Tip projects in the expected location of the duodenal bulb. Normal bowel gas pattern. IMPRESSION: Enteric feeding tube metallic tip projects in the expected location of the  duodenal bulb. Electronically Signed   By: Amie Portland M.D.   On: 10/24/2015 11:39  Chest Xray- 4/11- remarkably worse on the right today   STUDIES:  4/16 pCXR: bibasilar atelectasis with small bilateral pleural effusions 4/12 pAbd XR: Normal bowel gas pattern 4/11 pCXR: pulmonary vascular congestion, patchy consolidation of bilateral bases and bilateral pleural effusions R>L 4/5 Chest CT W Contrast- Cavitary lesions- largest- on left - 31mm and on right- 36mm, bilat pleural effusions. 4/5 Head Ct with Contrast- Evolving, large  subacute right MCA territory infarct with unchanged mass effect. 2. Small occipital infarcts better demonstrated on MRI.  4/1 MRI brain w Wo contrast- Large acute right MCA infarct, without hemorrhagic conversion or propagation. Evolving small bilateral occipital lobe infarcts. MRA HEAD: Mildly motion degraded examination. Acute RIGHT distal M1 occlusion consistent with embolic phenomena. 3/29 US renal . No hydronephrosis. 2. Increased parenchymal echogenicity bilaterally compatible with chronic medical renal disease. 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 No significant right-to-left shift or hemorrhagic transformation.BILATERAL occipital lobe hypodensities, subcentimeter size, could represent brain abscesses (versus acute subcortical infarcts). Consider continued surveillance with post infusion MRI. 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 EF of 50-55%  CULTURES: 3/28 BC > mssa 3/28 CSF-normal 3/30>>>  Staph aureus-MSSA again 4/3  B/c>>> FINAL No growth. 4/4 tracheal Cultures >>> Moraxella Final. 4/11 BCx>>NG 4/11 UCx>> Yeast 4/12 Right pleural effusion>> NGTD   ANTIBIOTICS: Naficillin 3/30>>> 4/6 Meropenem 4/6 >> 4/10 Resumed NAfcillin 4/10 >>  SIGNIFICANT EVENTS: 3/28 admitted 3/30 intubated. PCCM consult 4/4- Picc inserted, Central line removed. 4/6 Antibiotics changed from Nafcillin to Meropenem to broaden coverage to include Gram Neg and Anaerobes 4/6 Tracheostomy 4/10- Meropenem d/c, narrowed back to Nafcillin 4/12 Thoracentesis>> exudative  LINES/TUBES: 3/30 ETT>>> 4/6 3/30 Central line >>>out 4/4 Picc 4/4 >>  Tracheostomy (df) 4/6 >> 4/13 foley >>  DISCUSSION: 35 year old female with h/o polysubstance abuse now presents with right sided septic emboli ddue to IVDU. Major issues are debilitation s/p CVA, agitation, NAG metabolic acidosis.This  has improved. We will cont supportive care. Off precedex. Still spiking fevers. Head and chest Ct chest 4/5- evolving large infarct and chest Ct with bilat. effusions. Meropenem 5 day course given, and then narrowed back to Nafcillin for MSSA.  ASSESSMENT / PLAN:  PULMONARY A: Acute Hypoxemic respiratory failure 2/2 > Multifocal PNA due to septic emboli, with cavitary lesions Pulm edema improving Exudative Pleural Effusion s/p Thoracentesis Atelectasis P:   Continue ATC ad lib, goal 24x7 Neg balance goals Cont abx- now narrowed to nafcillin Follow cultures from pleural effusion  SLP following for swallowing Desat event 4/17 ? 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  potassium daily  GASTROINTESTINAL A:   ?mesentric ischemia possibly due  to IVDU Anemia likely from sepsis Abdominal Pain- appears improved on exam  Loose stools/diarrrhea- C diff neg P:   tolerating feeds pepcid BID for PUD > possible gastritis.  NG tube placed 4/17  HEMATOLOGIC A:   Anemia, dilution  Thrombocytopenia r/t sepsis - Resolved DVT prevention P:  scds for  Heparin sq DVT prophylaxis Monitor Hb and Hct.  Goal Hct >7 transfuse if less  INFECTIOUS A:   MSSA endocarditis with septic emboli.  Persistent fevers- improved after thoracentesis Pleural effusions>>  Exudative small abscess right foot s/p I&D HSV outbreak Fungal UTI P:   Appreciate ID recs- cont nafcillin for total 6 weeks from neg cultures Acyclovir x 7 days (STOP 4/18) Seems to be a success for abx  ENDOCRINE A:   No acute issues P:   BG intermitently  NEUROLOGIC A:   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  Encephalopathy  Heroine withdrawal  Intermittent Agitation Large right MCA infarct Opioid dependence P:   Increase Methadone  20mg  Q6 - check EKG for QTc seroquel (monitor QTc) Cont Klonopin  FAMILY  - Updates:    Cassandra RungErik C Hoffman, DO IMTS PGY-3  10/25/2015, 7:41 AM [facility] [phone] or if no answer [phone]   STAFF NOTE: I, Rory Percyaniel Feinstein, MD FACP have personally reviewed patient's available data, including medical history, events of note, physical examination and test results as part of my evaluation. I have discussed with resident/NP and other care providers such as pharmacist, RN and RRT. In addition, I personally evaluated patient and elicited key findings of: awake, noted some reduced bs bases, ronchi, improved pcxr  With neg balance, maintain lasix same dosing, keep cuff up most of time with concerns ATX from secretions?, may need mucomysts, suction, may need chest pt, pcxr repeat in am , mobilize her, nafcillin to maintain, dc acyclovir, await SDU bed,. Limit PMV, repeat swallow  Mcarthur Rossettianiel J. Tyson AliasFeinstein, MD, FACP Pgr: [phone] [facility] 10/25/2015 10:09 AM

## 2015-10-25 NOTE — Progress Notes (Signed)
Occupational Therapy Treatment Patient Details Name: Cassandra Wall MRN: [id number] DOB: Jan 15, 1981 Today's Date: 10/25/2015    History of present illness 35 y.o. female with IVDU history, recently in drug rehab but left, continued IVDU here with AMS, somnolence and now noted to have Staph aureus bacteremia, pulmonary septic emboli and septic cerebral emboli. TTE notes two vegetations on tricuspid valve, one on aortic valve. + fever, chills. 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 on 10/06/15, pt with increased agitation presumably due to withdrawal.  She was intubated.  Repeat MRI 4/1 showed evolving large Rt MCA infarct and evolving small bil occipital infarcts.  Chest CT 4/5 showed cavitary lesions bil. lungs and bil pleural effusions.  Tracheostomy 4/6.   OT comments  Pt with RR 30-38 and oxygen saturations 86% for sustain period of time. Pt required > 2 minutes to rebound from desaturation. Pt reports dizziness with hand gesture. Pt tolerated eob for 10 minutes this session. Pt gesturing to return to supine this session . Pt writing to therapist in static sitting position. Pt noted to have dysconjugate eyes and L eye drift with cover uncover test. Ot was unable to assess R eye because patient continued to close it with each attempt. Pt closing R eye adn using only L eye. Question diplopia present but patient reports "no" when questioned due to R eye closed by patient.    Follow Up Recommendations  SNF;Supervision/Assistance - 24 hour    Equipment Recommendations  None recommended by OT    Recommendations for Other Services      Precautions / Restrictions Precautions Precautions: Fall Precaution Comments: trach, foley, panda Restrictions Weight Bearing Restrictions: No       Mobility Bed Mobility Overal bed mobility: Needs Assistance Bed Mobility: Supine to Sit     Supine to sit: +2 for physical assistance;Max assist Sit  to supine: Mod assist;HOB elevated;+2 for physical assistance   General bed mobility comments: Pt able to advance R LE back onto the bed from seat position. pt without L LE or UE use this session  Transfers                 General transfer comment: pt requesting to return to supine this session. unable to complete transfer attempt at this time    Balance Overall balance assessment: Needs assistance Sitting-balance support: Single extremity supported;Feet supported Sitting balance-Leahy Scale: Poor                             ADL Overall ADL's : Needs assistance/impaired     Grooming: Wash/dry hands;Set up;Sitting Grooming Details (indicate cue type and reason): pt wiping mouth appropriately with oral secretions. Pt with inability to control oral secretions at this time                               General ADL Comments: Pt tolerated eob for 10 minutes this session with min- mod (A) level. pt with cues able to elongate posture and lift head to natural . pt unable to sustain neutral positioning      Vision                     Perception     Praxis      Cognition   Behavior During Therapy: [facility] for tasks assessed/performed Overall Cognitive Status: Difficult to  assess                  General Comments: pt following commands appropriately. Pt pointing to RN on the board and then gesturing to write. pt writing information that was unable to be read. Ot cueing patient to print each letter slowly. pt writes DAD phone. OT asking do you want the RN to call your dad? pt shakes head yes    Extremity/Trunk Assessment               Exercises     Shoulder Instructions       General Comments      Pertinent Vitals/ Pain       Pain Assessment: Faces Faces Pain Scale: Hurts even more Pain Location: facial grimance but no known source. pt requesting to lay back down  Home Living                                           Prior Functioning/Environment              Frequency Min 3X/week     Progress Toward Goals  OT Goals(current goals can now be found in the care plan section)  Progress towards OT goals: Progressing toward goals  Acute Rehab OT Goals Patient Stated Goal: none stated OT Goal Formulation: Patient unable to participate in goal setting Potential to Achieve Goals: Fair ADL Goals Pt Will Perform Grooming: with mod assist;sitting Additional ADL Goal #1: Pt will sit unsupported with mod A in prep for ADLs Additional ADL Goal #2: Pt will sustain attention to familiar ADL tasks for 10 mins with min cues Additional ADL Goal #3: Pt will participate in 15 mins therapeutic activity to increase activity tolerance needed for ADLs.   Plan Discharge plan remains appropriate    Co-evaluation                 End of Session Equipment Utilized During Treatment: Oxygen   Activity Tolerance Patient limited by fatigue   Patient Left in bed;with call bell/phone within reach;with bed alarm set   Nurse Communication Mobility status;Precautions        Time: 1016-1030 OT Time Calculation (min): 14 min  Charges: OT General Charges $OT Visit: 1 Procedure OT Treatments $Therapeutic Activity: 8-22 mins  Cassandra Wall, Cassandra Wall 10/25/2015, 1:52 PM   Cassandra Wall, Cassandra Wall   OTR/L Pager: [phone] Office: [phone] .

## 2015-10-25 NOTE — Progress Notes (Signed)
Speech Language Pathology Treatment: Cassandra Wall  Cassandra Wall Cassandra Wall Cassandra Wall Today's Date: Cassandra Wall Time: [id number] SLP Time Calculation (min) (ACUTE ONLY): 12 min  Assessment / Plan / Recommendation Clinical Impression  Pt is alert and with cuff deflated upon SLP arrival. She appears to have increased secretions from previous date, although they remain thin. She is able to mobilize them with Mod cues for coughing, although RN still provided tracheal suction as SpO2 dropped to 87% and RR peaked at 40. SpO2 returned to 90% and above after suction, although RR remained primarily in the mid- to high 30s. SLP placed PMSV for 1-2 respiratory cycles in an attempt to further mobilize secretions and facilitate upper airway use, although she continues to have no evidence of airflow with Wall in place. Burst of air upon removal indicative of air trapping secondary to reduced patency. Cuff was inflated upon completion of tx per MD instruction. Pt remains inappropriate for PO trials given dysphagia prior to prolonged intubation, current respiratory status, and level of difficulty managing secretions. Will continue to follow and progress as able, although anticipate that trach may need to be downsized before she will be able to have much success.   HPI HPI: 35 y.o. female with IVDU history, recently in drug rehab but left, continued IVDU here with AMS, somnolence and now noted to have Staph aureus bacteremia, pulmonary septic emboli and septic cerebral emboli. TTE notes two vegetations on tricuspid Wall, one on aortic Wall. + fever, chills. 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 on 10/06/15, pt with increased agitation presumably due to withdrawal. She was intubated 3/30. Repeat MRI 4/1 showed evolving large Rt MCA infarct and evolving small bil occipital infarcts. Chest CT 4/5 showed cavitary  lesions bil. lungs and bil pleural effusions. Tracheostomy 4/6.      SLP Plan  Continue with current plan of care     Recommendations         Cassandra may use [name] Speech Wall: with SLP only MD: Please consider changing trach tube to : Smaller size;Cuffless      Oral Care Recommendations: Oral care QID Follow up Recommendations: Inpatient Rehab Plan: Continue with current plan of care     GO               Cassandra Wall, M.A. CCC-SLP [phone]  Cassandra Wall, Cassandra Wall Cassandra Wall, 10:25 AM
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
## 2015-10-25 NOTE — [place] FL2 (Signed)
[place] MEDICAID FL2 LEVEL OF CARE SCREENING TOOL     IDENTIFICATION  Patient Name: Cassandra Wall Birthdate: June 18, 1981 Sex: female Admission Date (Current Location): 10/04/2015  [facility] and [place] Number:  Producer, television/film/video and Address:  [facility], [address], [place], [place] [address]      Provider Number: [id number]  Attending Physician Name and Address:  Nelda Bucks, MD  Relative Name and Phone Number:       Current Level of Care: Hospital Recommended Level of Care: Skilled Nursing Facility Prior Approval Number:    Date Approved/Denied:   PASRR Number: [id number]  Discharge Plan: SNF    Current Diagnoses: Patient Active Problem List   Diagnosis Date Noted  . Cellulitis and abscess of leg, except foot   . Encounter for imaging study to confirm nasogastric (NG) tube placement   . Septic pulmonary embolism (HCC)   . Candiduria   . Pleural effusion   . Encounter for feeding tube placement   . Tracheostomy care (HCC)   . Brainstem infarct, acute (HCC)   . Moraxella catarrhalis pneumonia (HCC)   . Acute respiratory failure (HCC)   . Encounter for orogastric (OG) tube placement   . PICC (peripherally inserted central catheter) in place   . Brain abscess   . PNA (pneumonia)   . Septic shock (HCC)   . Staphylococcus aureus bacteremia with sepsis (HCC)   . AKI (acute kidney injury) (HCC)   . Pressure ulcer 10/06/2015  . Acute respiratory failure with hypoxia (HCC)   . Altered mental status   . Endotracheally intubated   . Chest pain 10/05/2015  . Multifocal Pneumonia, due to septic emboli 10/05/2015  . Anemia 10/05/2015  . Stroke (cerebrum) (HCC)   . Endocarditis 10/04/2015  . Sepsis (HCC) secondary to endocarditis in an IV drug abuser 10/04/2015  . Acute renal failure (ARF) (HCC) 10/04/2015  . Thrombocytopenia (HCC) 10/04/2015  . Septic embolism (HCC) 10/04/2015  . Acute embolic stroke (HCC) 10/04/2015  .  Prolonged Q-T interval on ECG 10/04/2015  . Right forearm cellulitis 11/04/2014  . IV drug abuse 11/04/2014  . Transaminitis 11/04/2014  . Normocytic anemia 11/04/2014  . Endometriosis 02/02/2011  . 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    Orientation RESPIRATION BLADDER Height & Weight     Self, Place, Situation  Tracheostomy (shiley brand; 6mm cuffed 28% FiO2) Indwelling catheter Weight: 119 lb 11.4 oz (54.3 kg) Height:   (172.7 cm)  BEHAVIORAL SYMPTOMS/MOOD NEUROLOGICAL BOWEL NUTRITION STATUS      Incontinent Feeding tube  AMBULATORY STATUS COMMUNICATION OF NEEDS Skin   Total Care Non-Verbally (Patient is trached) Skin abrasions (pressure ulcer- deep tissue injury; location- heel )                       Personal Care Assistance Level of Assistance  Feeding, Bathing, Dressing, Total care Bathing Assistance: Maximum assistance Feeding assistance: Maximum assistance Dressing Assistance: Maximum assistance Total Care Assistance: Maximum assistance   Functional Limitations Info  Sight, Hearing, Speech Sight Info: Adequate Hearing Info: Adequate Speech Info: Adequate (Patient is trached)    SPECIAL CARE FACTORS FREQUENCY                       Contractures Contractures Info: Present    Additional Factors Info  Code Status, Allergies, Psychotropic, Isolation Precautions Code Status Info: FULL Allergies Info: Wheat; Ciprofloxacin HCL; Latex Psychotropic Info: Klonopin; Seroquel  Isolation Precautions Info: MRSA     Current Medications (10/25/2015):  This is the current hospital active medication list Current Facility-Administered Medications  Medication Dose Route Frequency Provider Last Rate Last Dose  . 0.9 %  sodium chloride infusion   Intravenous Continuous Nelda Bucks, MD 10 mL/hr at 10/24/15 1900    . acetaminophen (TYLENOL) solution 650 mg  650 mg Oral Q6H PRN Coralyn Helling, MD   650 mg at 10/24/15 0600  .  acyclovir (ZOVIRAX) 200 MG/5ML suspension SUSP 400 mg  400 mg Oral 3 times per day Onnie Boer, MD   400 mg at 10/25/15 0651  . antiseptic oral rinse (CPC / CETYLPYRIDINIUM CHLORIDE 0.05%) solution 7 mL  7 mL Mouth Rinse q12n4p Nelda Bucks, MD      . chlorhexidine (PERIDEX) 0.12 % solution 15 mL  15 mL Mouth Rinse BID Nelda Bucks, MD   15 mL at 10/24/15 2136  . clonazePAM (KLONOPIN) tablet 2 mg  2 mg Per Tube TID Leslye Peer, MD   2 mg at 10/25/15 0943  . docusate (COLACE) 50 MG/5ML liquid 100 mg  100 mg Per Tube BID PRN Coralyn Helling, MD      . feeding supplement (VITAL AF 1.2 CAL) liquid 1,000 mL  1,000 mL Per Tube Continuous Nelda Bucks, MD 65 mL/hr at 10/25/15 0817 1,000 mL at 10/25/15 0817  . fentaNYL (SUBLIMAZE) injection 25-100 mcg  25-100 mcg Intravenous Q2H PRN Nelda Bucks, MD   100 mcg at 10/25/15 0359  . furosemide (LASIX) injection 20 mg  20 mg Intravenous BID Ejiroghene E Mariea Clonts, MD   20 mg at 10/25/15 0809  . heparin injection 5,000 Units  5,000 Units Subcutaneous 3 times per day Valentino Nose, MD   5,000 Units at 10/25/15 [phone]  . LORazepam (ATIVAN) injection 2 mg  2 mg Intravenous Q4H PRN Chilton Greathouse, MD   2 mg at 10/25/15 0647  . methadone (DOLOPHINE) tablet 20 mg  20 mg Per Tube Q6H Gust Rung, DO   20 mg at 10/25/15 0809  . metoprolol tartrate (LOPRESSOR) tablet 25 mg  25 mg Oral BID Valentino Nose, MD   25 mg at 10/25/15 0943  . nafcillin 2 g in dextrose 5 % 100 mL IVPB  2 g Intravenous 6 times per day Sallee Provencal, RPH   2 g at 10/25/15 0809  . ondansetron (ZOFRAN) injection 4 mg  4 mg Intravenous Q6H PRN Christina P Rama, MD      . pantoprazole (PROTONIX) injection 40 mg  40 mg Intravenous Q12H Jose Angelo Dorcas Mcmurray, MD   40 mg at 10/25/15 0940  . potassium chloride 20 MEQ/15ML (10%) solution 40 mEq  40 mEq Oral Daily Gust Rung, DO   40 mEq at 10/25/15 0943  . QUEtiapine (SEROQUEL) tablet 100 mg  100 mg Oral BID Leslye Peer, MD   100 mg at 10/25/15 0943  . sodium chloride flush (NS) 0.9 % injection 10-40 mL  10-40 mL Intracatheter Q12H Nelda Bucks, MD   10 mL at 10/25/15 0940  . sodium chloride flush (NS) 0.9 % injection 10-40 mL  10-40 mL Intracatheter PRN Nelda Bucks, MD   10 mL at 10/21/15 0927  . sodium chloride flush (NS) 0.9 % injection 10-40 mL  10-40 mL Intracatheter Q12H Nelda Bucks, MD   10 mL at 10/25/15 0940  . sodium chloride flush (NS) 0.9 % injection 10-40 mL  10-40 mL Intracatheter PRN Nelda Bucksaniel J Feinstein, MD         Discharge Medications: Please see discharge summary for a list of discharge medications.  Relevant Imaging Results:  Relevant Lab Results:   Additional Information SS #[id number]  Rockwell GermanyAshley N Gardner, LCSW

## 2015-10-26 ENCOUNTER — Inpatient Hospital Stay (HOSPITAL_COMMUNITY): Payer: Medicaid Other

## 2015-10-26 ENCOUNTER — Encounter (HOSPITAL_COMMUNITY): Payer: Self-pay

## 2015-10-26 DIAGNOSIS — A419 Sepsis, unspecified organism: Secondary | ICD-10-CM

## 2015-10-26 DIAGNOSIS — I639 Cerebral infarction, unspecified: Secondary | ICD-10-CM

## 2015-10-26 LAB — BASIC METABOLIC PANEL
Anion gap: 9 (ref 5–15)
BUN: 17 mg/dL (ref 6–20)
CALCIUM: 8.9 mg/dL (ref 8.9–10.3)
CO2: 30 mmol/L (ref 22–32)
CREATININE: 0.56 mg/dL (ref 0.44–1.00)
Chloride: 100 mmol/L — ABNORMAL LOW (ref 101–111)
GFR calc Af Amer: >60 mL/min (ref >60)
Glucose, Bld: 105 mg/dL — ABNORMAL HIGH (ref 65–99)
POTASSIUM: 4.3 mmol/L (ref 3.5–5.1)
SODIUM: 139 mmol/L (ref 135–145)

## 2015-10-26 LAB — GLUCOSE, CAPILLARY
GLUCOSE-CAPILLARY: 115 mg/dL — AB (ref 65–99)
Glucose-Capillary: 86 mg/dL (ref 65–99)
Glucose-Capillary: 87 mg/dL (ref 65–99)
Glucose-Capillary: 97 mg/dL (ref 65–99)

## 2015-10-26 LAB — MAGNESIUM: MAGNESIUM: 1.9 mg/dL (ref 1.7–2.4)

## 2015-10-26 MED ORDER — METHADONE HCL 10 MG PO TABS: 20.0000 mg | ORAL_TABLET | Freq: Four times a day (QID) | ORAL | Status: DC

## 2015-10-26 MED ORDER — METOPROLOL TARTRATE 25 MG PO TABS: 25.0000 mg | ORAL_TABLET | Freq: Two times a day (BID) | ORAL | Status: DC

## 2015-10-26 MED ORDER — DEXTROSE 5 % IV SOLN: 2.0000 g | INTRAVENOUS | Status: DC

## 2015-10-26 MED ORDER — HEPARIN SODIUM (PORCINE) 5000 UNIT/ML IJ SOLN
5000.0000 [IU] | Freq: Three times a day (TID) | INTRAMUSCULAR | Status: DC
Start: 2015-10-26 — End: 2015-11-22

## 2015-10-26 MED ORDER — CLONAZEPAM 2 MG PO TABS: 2.0000 mg | ORAL_TABLET | Freq: Three times a day (TID) | ORAL | Status: DC

## 2015-10-26 MED ORDER — DOCUSATE SODIUM 50 MG/5ML PO LIQD: 100.0000 mg | Freq: Two times a day (BID) | ORAL | Status: DC | PRN

## 2015-10-26 MED ORDER — FUROSEMIDE 10 MG/ML IJ SOLN: 20.0000 mg | Freq: Two times a day (BID) | INTRAMUSCULAR | Status: DC

## 2015-10-26 MED ORDER — VITAL AF 1.2 CAL PO LIQD: 1000.0000 mL | ORAL | Status: DC

## 2015-10-26 MED ORDER — POTASSIUM CHLORIDE 20 MEQ/15ML (10%) PO SOLN: 40.0000 meq | Freq: Every day | ORAL | Status: DC

## 2015-10-26 MED ORDER — PANTOPRAZOLE SODIUM 40 MG IV SOLR
40.0000 mg | Freq: Two times a day (BID) | INTRAVENOUS | Status: DC
Start: 2015-10-26 — End: 2015-11-22

## 2015-10-26 MED ORDER — ACETAMINOPHEN 160 MG/5ML PO SOLN: 650.0000 mg | Freq: Four times a day (QID) | ORAL | Status: DC | PRN

## 2015-10-26 MED ORDER — LORAZEPAM 2 MG/ML IJ SOLN: 2.0000 mg | INTRAMUSCULAR | Status: DC | PRN

## 2015-10-26 MED ORDER — QUETIAPINE FUMARATE 100 MG PO TABS: 100.0000 mg | ORAL_TABLET | Freq: Two times a day (BID) | ORAL | Status: DC

## 2015-10-26 NOTE — Progress Notes (Signed)
Occupational Therapy Treatment Patient Details Name: Cassandra Wall MRN: [id number] DOB: 06-Dec-1980 Today's Date: 10/26/2015    History of present illness 35 y.o. female with IVDU history, recently in drug rehab but left, continued IVDU here with AMS, somnolence and now noted to have Staph aureus bacteremia, pulmonary septic emboli and septic cerebral emboli. TTE notes two vegetations on tricuspid valve, one on aortic valve. + fever, chills. 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 on 10/06/15, pt with increased agitation presumably due to withdrawal.  She was intubated.  Repeat MRI 4/1 showed evolving large Rt MCA infarct and evolving small bil occipital infarcts.  Chest CT 4/5 showed cavitary lesions bil. lungs and bil pleural effusions.  Tracheostomy 4/6.   OT comments  Pt attempting to exit the bed without alarm sounding on arrival. Pt assisted with toilet transfer and chair positioning. Pt high fall risk and will need frequent checks by RN staff. Advised staff to place bed alarm on lowest setting to alert staff sooner.    Follow Up Recommendations  Supervision/Assistance - 24 hour;CIR    Equipment Recommendations  3 in 1 bedside comode;Hospital bed    Recommendations for Other Services Rehab consult    Precautions / Restrictions Precautions Precautions: Fall Precaution Comments: trach, foley Restrictions Weight Bearing Restrictions: No       Mobility Bed Mobility Overal bed mobility: Needs Assistance Bed Mobility: Sit to Supine       Sit to supine: Min assist;+2 for physical assistance   General bed mobility comments: on EOB on arrival  Transfers Overall transfer level: Needs assistance Equipment used: 2 person hand held assist Transfers: Sit to/from Stand Sit to Stand: +2 physical assistance;Min assist Stand pivot transfers: Mod assist       General transfer comment:  Applied Posey belt restraint, R arm  restraint, and R hand mit as pt tries to get up on her own alot and tries to pull at lines with her R arm. Pt has heavy posterior lean with transfers. Requires verbal and tactile cues for initiation and seuencing of tasks. or safety and hand placement. pt with more controlled oral secretions this session but does still demonstrate some.      Balance Overall balance assessment: Needs assistance Sitting-balance support: Bilateral upper extremity supported;Feet supported Sitting balance-Leahy Scale: Fair     Standing balance support: Bilateral upper extremity supported;During functional activity Standing balance-Leahy Scale: Poor Standing balance comment: Relies on UEs.  Posterior lean.                     ADL Overall ADL's : Needs assistance/impaired Eating/Feeding: NPO   Grooming: Wash/dry hands;Set up;Sitting Grooming Details (indicate cue type and reason): wiping face appropriately                 Toilet Transfer: +2 for physical assistance;Minimal assistance;Stand-pivot   Toileting- Clothing Manipulation and Hygiene: Total assistance         General ADL Comments: pt voiding in bed and attempting to exit bed due to wet surface. pt transferred to [facility]. pt voiding again bowel and bladder. Pt impulsive and atetmpting to stand from commode. pt needed constant cues to remain seated. pt's bed wet and unable to return to surface at this time. pt placed in recliner with posey belt, alarm and UE restraints. RN notified of patient risk, location and previous knowledge of patient      Vision  Perception     Praxis      Cognition   Behavior During Therapy: Impulsive Overall Cognitive Status: Difficult to assess Area of Impairment: Attention;Following commands;Safety/judgement;Awareness   Current Attention Level: Focused Memory: Decreased recall of precautions  Following Commands: Follows one step commands consistently Safety/Judgement:  Decreased awareness of safety;Decreased awareness of deficits   Problem Solving: Slow processing;Decreased initiation;Difficulty sequencing;Requires verbal cues;Requires tactile cues General Comments: Pt following commands appropriately. Pt actively trying to get OOB upon entry to room. Pt's heart leads and pulse ox had been pulled off and R restraint was not on or the hand mit. She was pointing to her groin and pointing to the bathroom.     Extremity/Trunk Assessment               Exercises     Shoulder Instructions       General Comments      Pertinent Vitals/ Pain       Pain Assessment: No/denies pain  Home Living                                          Prior Functioning/Environment              Frequency Min 3X/week     Progress Toward Goals  OT Goals(current goals can now be found in the care plan section)  Progress towards OT goals: Progressing toward goals  Acute Rehab OT Goals Patient Stated Goal: none stated OT Goal Formulation: Patient unable to participate in goal setting Potential to Achieve Goals: Fair ADL Goals Pt Will Perform Grooming: with mod assist;sitting Additional ADL Goal #1: Pt will sit unsupported with mod A in prep for ADLs Additional ADL Goal #2: Pt will sustain attention to familiar ADL tasks for 10 mins with min cues Additional ADL Goal #3: Pt will participate in 15 mins therapeutic activity to increase activity tolerance needed for ADLs.   Plan Discharge plan needs to be updated    Co-evaluation                 End of Session Equipment Utilized During Treatment: Oxygen;Gait belt   Activity Tolerance Patient tolerated treatment well   Patient Left in chair;with call bell/phone within reach;with chair alarm set;with restraints reapplied   Nurse Communication Mobility status;Precautions        Time: [id number] OT Time Calculation (min): 26 min  Charges: OT General Charges $OT Visit: 1  Procedure OT Treatments $Self Care/Home Management : 8-22 mins  Boone MasterJones, Vianna Venezia B 10/26/2015, 3:19 PM  Mateo FlowJones, Brynn   OTR/L Pager: [phone] Office: [phone] .

## 2015-10-26 NOTE — Discharge Summary (Signed)
Physician Discharge Summary  Patient ID: Cassandra Wall MRN: [id number] DOB/AGE: [birth year] 35 y.o.  Admit date: 10/04/2015 Discharge date: 10/26/2015  Problem List Principal Problem:   Sepsis (HCC) secondary to endocarditis in an IV drug abuser Active Problems:   IV drug abuse   Acute renal failure (ARF) (HCC)   Thrombocytopenia (HCC)   Septic embolism (HCC)   Acute embolic stroke (HCC)   Prolonged Q-T interval on ECG   Multifocal Pneumonia, due to septic emboli   Anemia   Stroke (cerebrum) (HCC)   Pressure ulcer   Acute respiratory failure with hypoxia (HCC)   Altered mental status   Endotracheally intubated   AKI (acute kidney injury) (HCC)   Brain abscess   PNA (pneumonia)   Septic shock (HCC)   Staphylococcus aureus bacteremia with sepsis (HCC)   Acute respiratory failure (HCC)   Encounter for orogastric (OG) tube placement   PICC (peripherally inserted central catheter) in place   Brainstem infarct, acute (HCC)   Moraxella catarrhalis pneumonia (HCC)   Encounter for feeding tube placement   Tracheostomy care (HCC)   Pleural effusion   Encounter for imaging study to confirm nasogastric (NG) tube placement   Septic pulmonary embolism (HCC)   Candiduria   Cellulitis and abscess of leg, except foot  HPI: Cassandra Wall is an 35 y.o. female with a past medical history significant for polysubstance abuse (IV heroin and cocaine) and tobacco abuse who presented acutely altered with complaints of 2 day history of left-sided weakness. She is accompanied by her mother and her husband. The patient's mother says that the patient has told her conflicting information about her recent medical problems, and told her she was in some sort of witness protection program. 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 She was told she had some sort of parasitic infection and this is why she has skin lesions all over. The patient is currently unresponsive and unable to  provide any history. The patient's husband is dozing at the bedside and is evasive and unable to provide any information as well. UDS pending. CT scan of the brain showed signs suggestive of a right sided infarct, possibly acute. She has already been evaluated by neurology who suspects patient having septic emboli from cardiac source and recommended lumbar puncture (already completed) and transfer [facility] stepdown [facility]. Patient was placed on broad-spectrum antibiotics of vancomycin and cefepime. Echocardiogram pending. Of note, she has a history of shooting up with her brother, who recently died of endocarditis.  Hospital Course: Temp:  [98.5 F (36.9 C)-99.6 F (37.6 C)] 99.6 F (37.6 C) (04/19 0853) Pulse Rate:  [95-137] 98 (04/19 0853) Cardiac Rhythm:  [-] Sinus tachycardia (04/19 0708) Resp:  [15-41] 21 (04/19 0853) BP: (97-132)/(50-79) 131/73 mmHg (04/19 0853) SpO2:  [97 %-100 %] 100 % (04/19 0822) FiO2 (%):  [28 %] 28 % (04/19 0822) Weight:  [118 lb 11.2 oz (53.842 kg)] 118 lb 11.2 oz (53.842 kg) (04/19 0410)    PHYSICAL EXAMINATION: General: Awake, alert, can mouth answers to questions, somewhat agitated  Neuro: Moving all extremities HEENT: Atraumatic, normocephalic, Tracheostomy in place, on ATC 28%, mild secretion retention Cardiovascular: regular, tachycardiac, 2/6 systolic murmur Lungs: Coarse bilaterally Abdomen: Soft, nontender Musculoskeletal: No edema, mittens in place over hands Skin: diaphoretic, track marks all over the body, SCDs present    Imaging Dg Abd Portable 1v  10/24/2015 CLINICAL DATA: Enteric feeding tube placement. EXAM: PORTABLE ABDOMEN - 1 VIEW COMPARISON: 10/23/2015 FINDINGS: The nasogastric  tube has been removed. An enteric feeding tube passes below the diaphragm. Tip projects in the expected location of the duodenal bulb. Normal bowel gas pattern. IMPRESSION: Enteric feeding tube metallic tip projects in the expected location of the  duodenal bulb. Electronically Signed By: Amie Portland M.D. On: 10/24/2015 11:39  Chest Xray- 4/11- remarkably worse on the right today   STUDIES:  4/16 pCXR: bibasilar atelectasis with small bilateral pleural effusions 4/12 pAbd XR: Normal bowel gas pattern 4/11 pCXR: pulmonary vascular congestion, patchy consolidation of bilateral bases and bilateral pleural effusions R>L 4/5 Chest CT W Contrast- Cavitary lesions- largest- on left - 31mm and on right- 36mm, bilat pleural effusions. 4/5 Head Ct with Contrast- Evolving, large subacute right MCA territory infarct with unchanged mass effect. 2. Small occipital infarcts better demonstrated on MRI.  4/1 MRI brain w Wo contrast- Large acute right MCA infarct, without hemorrhagic conversion or propagation. Evolving small bilateral occipital lobe infarcts. MRA HEAD: Mildly motion degraded examination. Acute RIGHT distal M1 occlusion consistent with embolic phenomena. 3/29 US renal . No hydronephrosis. 2. Increased parenchymal echogenicity bilaterally compatible with chronic medical renal disease. 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 No significant right-to-left shift or hemorrhagic transformation.BILATERAL occipital lobe hypodensities, subcentimeter size, could represent brain abscesses (versus acute subcortical infarcts). Consider continued surveillance with post infusion MRI. 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 EF of 50-55%  CULTURES: 3/28 BC > mssa 3/28 CSF-normal 3/30>>> Staph aureus-MSSA again 4/3 B/c>>> FINAL No growth. 4/4 tracheal Cultures >>> Moraxella Final. 4/11 BCx>>NG 4/11 UCx>> Yeast 4/12 Right pleural effusion>> NGTD   ANTIBIOTICS: Naficillin 3/30>>> 4/6 Meropenem 4/6 >> 4/10 Resumed NAfcillin 4/10 >>  SIGNIFICANT EVENTS: 3/28 admitted 3/30 intubated. PCCM consult 4/4- Picc inserted,  Central line removed. 4/6 Antibiotics changed from Nafcillin to Meropenem to broaden coverage to include Gram Neg and Anaerobes 4/6 Tracheostomy 4/10- Meropenem d/c, narrowed back to Nafcillin 4/12 Thoracentesis>> exudative  LINES/TUBES: 3/30 ETT>>> 4/6 3/30 Central line >>>out 4/4 Picc 4/4 >>  Tracheostomy (df) 4/6 >> 4/13 foley >>  DISCUSSION: 35 year old female with h/o polysubstance abuse now presents with right sided septic emboli ddue to IVDU. Major issues are debilitation s/p CVA, agitation, NAG metabolic acidosis.This has improved. We will cont supportive care. Off precedex. Still spiking fevers. Head and chest Ct chest 4/5- evolving large infarct and chest Ct with bilat. effusions. Meropenem 5 day course given, and then narrowed back to Nafcillin for MSSA.    ASSESSMENT / PLAN:  PULMONARY A: Acute Hypoxemic respiratory failure 2/2 > Multifocal PNA due to septic emboli, with cavitary lesions Pulm edema improving Exudative Pleural Effusion s/p Thoracentesis Atelectasis P:  Continue ATC ad lib, goal 24x7 Neg balance goals Cont abx- now narrowed to nafcillin Follow cultures from pleural effusion SLP following for swallowing Desat event 4/17 ? 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 Tachycardia (withdraw) P:  Not a candidate for surgery- CVTS- 3/29 >>>seems to be having a clinical success to abx Rpt blood culture > 4/3 (-)4/11 (-) Lasix 20 IV BID Monitor K and mag Repeat blood cultures again if spikes Add metop oral for BP and valvular associated dz may need to increase later today (increase methadone dose)  RENAL  Recent Labs Lab 10/25/15 0411 10/25/15 1808 10/26/15 0455  K 3.5 4.2 4.3    Lab  Results  Component Value Date   CREATININE 0.56 10/26/2015   CREATININE 0.50 10/25/2015   CREATININE 0.65 10/25/2015    Recent Labs Lab 10/25/15 0411  10/25/15 1808 10/26/15 0455  NA 141 141 139    A:  Hypokalemia - with diuresis hypomag P:  On TF, she is not getting free water, large input from tube feeds Lasix maintain Monitor Cr  Daily Bmet and MAg Goal K- >4, Mag >2. potassium daily  GASTROINTESTINAL A:  ?mesentric ischemia possibly due to IVDU Anemia likely from sepsis Abdominal Pain- appears improved on exam  Loose stools/diarrrhea- C diff neg P:  tolerating feeds pepcid BID for PUD > possible gastritis.  NG tube placed 4/17  HEMATOLOGIC  Recent Labs  10/24/15 0615  HGB 8.5*    A:  Anemia, dilution  Thrombocytopenia r/t sepsis - Resolved DVT prevention P:  scds for  Heparin sq DVT prophylaxis Monitor Hb and Hct.  Goal Hct >7 transfuse if less  INFECTIOUS A:  MSSA endocarditis with septic emboli.  Persistent fevers- improved after thoracentesis Pleural effusions>> Exudative small abscess right foot s/p I&D HSV outbreak Fungal UTI P:  Appreciate ID recs- cont nafcillin for total 6 weeks from neg cultures Acyclovir x 7 days (STOPPED 4/18) Seems to be a success for abx  ENDOCRINE CBG (last 3)   Recent Labs  10/26/15 0002 10/26/15 0440 10/26/15 0741  GLUCAP 97 115* 87     A:  No acute issues P:  BG intermitently  NEUROLOGIC A:  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  Encephalopathy  Heroine withdrawal  Intermittent Agitation Large right MCA infarct Opioid dependence P:   Methadone  Q6 - check EKG for QTc seroquel (monitor QTc) Cont Klonopin  FAMILY  - Updates: Mother updated at bedside 4/19 for discharge to [facility].   Labs at discharge Lab Results  Component Value Date   CREATININE 0.56 10/26/2015   BUN 17 10/26/2015   NA 139 10/26/2015   K 4.3 10/26/2015   CL 100* 10/26/2015   CO2 30 10/26/2015   Lab Results  Component Value Date   WBC 8.7 10/24/2015   HGB 8.5* 10/24/2015   HCT 28.4* 10/24/2015    MCV 86.3 10/24/2015   PLT 406* 10/24/2015   Lab Results  Component Value Date   ALT 17 10/10/2015   AST 27 10/10/2015   ALKPHOS 65 10/10/2015   BILITOT 0.8 10/10/2015   Lab Results  Component Value Date   INR 1.33 10/13/2015   INR 1.35 10/11/2015   INR 1.45 10/07/2015    Current radiology studies Dg Chest Port 1 View  10/26/2015  CLINICAL DATA:  Shortness of Breath EXAM: PORTABLE CHEST 1 VIEW COMPARISON:  October 25, 2015 FINDINGS: Endotracheal tube tip is 2.7 cm above the carina. Feeding tube tip is below the diaphragm. Central catheter tip is in the superior vena cava. No pneumothorax. There is patchy airspace disease in both lung bases as well as in the right perihilar region. Heart is borderline enlarged with pulmonary vascularity within normal limits. IMPRESSION: Tube and catheter positions as described without pneumothorax. Patchy opacity in the lower lung zone regions is stable. Suspect pneumonia, although pulmonary edema may present similarly. Both entities may exist concurrently. Stable cardiac silhouette. Electronically Signed   By: Bretta Bang III M.D.   On: 10/26/2015 07:18   Dg Chest Port 1 View  10/25/2015  CLINICAL DATA:  Low oxygen saturation.  Smoker. EXAM: PORTABLE CHEST 1 VIEW COMPARISON:  10/23/2015  FINDINGS: The tracheostomy tube tip is above the carina. There is a right arm PICC line with tip in the projection of the cavoatrial junction. Bilateral lower lobe predominant airspace densities are slightly improved from previous exam. IMPRESSION: 1. Improved aeration to the lung bases compared with previous exam. Electronically Signed   By: Signa Kell M.D.   On: 10/25/2015 08:26    Disposition:  01-Home or Self Care  Discharge Instructions    Discharge patient    Complete by:  As directed   [facility]            Medication List    TAKE these medications        acetaminophen 160 MG/5ML solution  Commonly known as:  TYLENOL  Take 20.3 mLs (650 mg  total) by mouth every 6 (six) hours as needed for mild pain, headache or fever.     clonazePAM 2 MG tablet  Commonly known as:  KLONOPIN  Place 1 tablet (2 mg total) into feeding tube 3 (three) times daily.     docusate 50 MG/5ML liquid  Commonly known as:  COLACE  Place 10 mLs (100 mg total) into feeding tube 2 (two) times daily as needed for mild constipation.     feeding supplement (VITAL AF 1.2 CAL) Liqd  Place 1,000 mLs into feeding tube continuous.     furosemide 10 MG/ML injection  Commonly known as:  LASIX  Inject 2 mLs (20 mg total) into the vein 2 (two) times daily.     heparin 5000 UNIT/ML injection  Inject 1 mL (5,000 Units total) into the skin every 8 (eight) hours.     LORazepam 2 MG/ML injection  Commonly known as:  ATIVAN  Inject 1 mL (2 mg total) into the vein every 4 (four) hours as needed for anxiety.     methadone 10 MG tablet  Commonly known as:  DOLOPHINE  Place 2 tablets (20 mg total) into feeding tube every 6 (six) hours.     metoprolol tartrate 25 MG tablet  Commonly known as:  LOPRESSOR  Take 1 tablet (25 mg total) by mouth 2 (two) times daily.     nafcillin 2 g in dextrose 5 % 100 mL  Inject 2 g into the vein every 4 (four) hours.     pantoprazole 40 MG injection  Commonly known as:  PROTONIX  Inject 40 mg into the vein every 12 (twelve) hours.     potassium chloride 20 MEQ/15ML (10%) Soln  Take 30 mLs (40 mEq total) by mouth daily.     QUEtiapine 100 MG tablet  Commonly known as:  SEROQUEL  Take 1 tablet (100 mg total) by mouth 2 (two) times daily.          Discharged Condition: fair  Time spent on discharge greater than 40 minutes.  Vital signs at Discharge. Temp:  [98.5 F (36.9 C)-99.6 F (37.6 C)] 99.6 F (37.6 C) (04/19 0853) Pulse Rate:  [95-137] 98 (04/19 0853) Resp:  [15-41] 21 (04/19 0853) BP: (97-132)/(50-79) 131/73 mmHg (04/19 0853) SpO2:  [97 %-100 %] 100 % (04/19 0822) FiO2 (%):  [28 %] 28 % (04/19 0822) Weight:   [118 lb 11.2 oz (53.842 kg)] 118 lb 11.2 oz (53.842 kg) (04/19 0410) Office follow up Special Information or instructions. She will be followed by [facility]. Signed: Brett Canales Minor ACNP Adolph Pollack PCCM Pager [phone] till 3 pm If no answer page [phone] 10/26/2015, 11:54 AM   STAFF NOTE: IRory Percy,  MD FACP have personally reviewed patient's available data, including medical history, events of note, physical examination and test results as part of my evaluation. I have discussed with resident/NP and other care providers such as pharmacist, RN and RRT. In addition, I personally evaluated patient and elicited key findings of: agree, see above    Mcarthur Rossetti. Tyson Alias, MD, FACP Pgr: [phone] [facility] 11/17/2015 1:36 PM

## 2015-10-26 NOTE — Progress Notes (Signed)
10/26/15 1300  PT Visit Information  Last PT Received On 10/26/15  Assistance Needed +2  History of Present Illness 35 y.o. female with IVDU history, recently in drug rehab but left, continued IVDU here with AMS, somnolence and now noted to have Staph aureus bacteremia, pulmonary septic emboli and septic cerebral emboli. TTE notes two vegetations on tricuspid valve, one on aortic valve. + fever, chills. 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 on 10/06/15, pt with increased agitation presumably due to withdrawal.  She was intubated.  Repeat MRI 4/1 showed evolving large Rt MCA infarct and evolving small bil occipital infarcts.  Chest CT 4/5 showed cavitary lesions bil. lungs and bil pleural effusions.  Tracheostomy 4/6.  PT Time Calculation  PT Start Time (ACUTE ONLY) 1032  PT Stop Time (ACUTE ONLY) 1043  PT Time Calculation (min) (ACUTE ONLY) 11 min  Subjective Data  Subjective Pt activelytrying to get out of chair on arrival to room.  Pt motioning to get back in bed.   Precautions  Precautions Fall  Precaution Comments trach, foley  Restrictions  Weight Bearing Restrictions No  Pain Assessment  Pain Assessment No/denies pain  Cognition  Arousal/Alertness Awake/alert  Behavior During Therapy Impulsive  Overall Cognitive Status Difficult to assess  Area of Impairment Attention;Following commands;Safety/judgement;Awareness  Current Attention Level Focused  Memory Decreased recall of precautions  Following Commands Follows one step commands consistently  Safety/Judgement Decreased awareness of safety;Decreased awareness of deficits  Problem Solving Slow processing;Decreased initiation;Difficulty sequencing;Requires verbal cues;Requires tactile cues  General Comments Pt following commands appropriately. Pt actively trying to get OOB upon entry to room. Pt's heart leads and pulse ox had been pulled off and R restraint was not on or the hand  mit. She was pointing to her groin and pointing to the bathroom.   Difficult to assess due to Tracheostomy  Bed Mobility  Overal bed mobility Needs Assistance  Bed Mobility Sit to Supine  Sit to supine Min assist;+2 for physical assistance  General bed mobility comments Needed assist to get LEs back into bed.    Transfers  Overall transfer level Needs assistance  Equipment used 2 person hand held assist  Transfers Sit to/from [employer] Transfers  Sit to Stand Mod assist  Stand pivot transfers Mod assist  General transfer comment Pt cued to lean forward and with min assist leaned forward.  Mod assist for sit to stand using UEs to pull up.  Pt was able to take pivotal steps to bed with ataxic gait pattern with LE instability bil LEs.  Very impulsive.    Balance  Overall balance assessment Needs assistance  Sitting-balance support Bilateral upper extremity supported;Feet supported  Sitting balance-Leahy Scale Fair  Standing balance support Bilateral upper extremity supported;During functional activity  Standing balance-Leahy Scale Poor  Standing balance comment Relies on UEs.  Posterior lean.    General Comments  General comments (skin integrity, edema, etc.) Pt motioning to go back to bed.  Assisted back to bed and used posey restraint in bed with mitt and right writst restraint placed back on at end of treatment.   PT - End of Session  Equipment Utilized During Treatment Gait belt;Oxygen  Activity Tolerance Patient limited by fatigue  Patient left in bed;with call bell/phone within reach;with bed alarm set  Nurse Communication Mobility status  PT - Assessment/Plan  PT Plan Current plan remains appropriate  PT Frequency (ACUTE ONLY) Min 4X/week  Recommendations for Other  Services Rehab consult  Follow Up Recommendations CIR;Supervision/Assistance - 24 hour  PT equipment Other (comment) (TBD by next level of care. )  PT Goal Progression  Progress towards PT goals Progressing  toward goals  PT General Charges  $$ ACUTE PT VISIT 1 Procedure  PT Treatments  $Therapeutic Activity 8-22 mins  [facility]ising[facility] Jersey Shore HospitalDawn Derrious Bologna,PT Acute Rehabilitation [phone] (pager)

## 2015-10-26 NOTE — Care Management Note (Addendum)
Case Management Note  Patient Details  Name: Cassandra ChouSamantha Wall MRN: [id number] Date of Birth: Nov 11, 1980  Subjective/Objective:    [facility] offering a bed for today.  Updated physician, states can go today.  CM, Henri Mayo states she has updated mother.  Plan for discharge to [facility] today.    Discharge summary faxed over.              Action/Plan:   Expected Discharge Date:   (unknown)               Expected Discharge Plan:  Long Term Acute Care (LTAC)  In-House Referral:  Clinical Social Work  Discharge planning Services  CM Consult  Post Acute Care Choice:    Choice offered to:     DME Arranged:    DME Agency:     HH Arranged:    HH Agency:     Status of Service:  Completed, signed off  Medicare Important Message Given:    Date Medicare IM Given:    Medicare IM give by:    Date Additional Medicare IM Given:    Additional Medicare Important Message give by:     If discussed at Long Length of Stay Meetings, dates discussed:    Additional Comments:  Vangie BickerBrown, Idolina Mantell Jane, RN 10/26/2015, 1:32 PM

## 2015-10-26 NOTE — Progress Notes (Signed)
Pt being transferred to [facility] by care link. Pt stable at time of transport. Pt's belongings returned. Family notified of pt's transport.

## 2015-10-26 NOTE — Progress Notes (Signed)
Inpatient Rehabilitation  Attempted to follow up with patient today; however, she was in a procedure.  Patient not medically ready for IP Rehab at this time given poor tolerance of cuff deflation and frequency of suctioning.   Note that [facility] has made a bed offer and plan is now for patient to discharge there.  Would recommend [facility] consult IP Rehab when patient is medically ready for intensive therapies.     Charlane FerrettiMelissa Kennedy Brines, M.A., CCC/SLP Admission Coordinator  [facility]  Cell [phone]

## 2015-10-26 NOTE — Progress Notes (Signed)
Pt pulled cortrak tube out this am per night shift nurse. No tube feeding infusing at this time. Cortrak tube team paged. Will continue to monitor.

## 2015-10-26 NOTE — Progress Notes (Signed)
Speech Language Pathology Treatment: Cassandra Wall Speaking valve  Patient Details Name: Cassandra Wall MRN: [id number] DOB: 1980/12/30 Today's Date: 10/26/2015 Time: [id number] SLP Time Calculation (min) (ACUTE ONLY): 9 min  Assessment / Plan / Recommendation Clinical Impression  F/u for PMV.  Cuff was partially inflated- removed air from pilot balloon. [name] - able to achieve minimal low volume, hoarse phonation while counting/naming DOW, but removal of valve after 3 breath cycles revealed air trapping. Mod cues required to phonate.  Pt likely needs downsize or change to cuffless trach in order to allow for improved airflow around trach/access to upper airway for speech. Cuff reinflated upon removal of valve.  Pt is discharging this afternoon to [facility] - will need SLP f/u for PMV and dysphagia rehab.     HPI HPI: 35 y.o. female with IVDU history, recently in drug rehab but left, continued IVDU here with AMS, somnolence and now noted to have Staph aureus bacteremia, pulmonary septic emboli and septic cerebral emboli. TTE notes two vegetations on tricuspid valve, one on aortic valve. + fever, chills. 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 on 10/06/15, pt with increased agitation presumably due to withdrawal. She was intubated 3/30. Repeat MRI 4/1 showed evolving large Rt MCA infarct and evolving small bil occipital infarcts. Chest CT 4/5 showed cavitary lesions bil. lungs and bil pleural effusions. Tracheostomy 4/6.      SLP Plan  Continue with current plan of care     Recommendations         Patient may use Passy-Wall Speech Valve: with SLP only PMSV Supervision: Full MD: Please consider changing trach tube to : Smaller size;Cuffless      Oral Care Recommendations: Oral care QID Plan: Continue with current plan of care     GO              Cassandra Wall, [place]MA CCC/SLP Pager  [phone]   Cassandra Wall, Cassandra Wall 10/26/2015, 3:22 PM

## 2015-10-26 NOTE — Progress Notes (Signed)
Physical Therapy Treatment Patient Details Name: Cassandra ChouSamantha XXXSpradley MRN: [id number] DOB: [birth year] Today's Date: 10/26/2015    History of Present Illness 35 y.o. female with IVDU history, recently in drug rehab but left, continued IVDU here with AMS, somnolence and now noted to have Staph aureus bacteremia, pulmonary septic emboli and septic cerebral emboli. TTE notes two vegetations on tricuspid valve, one on aortic valve. + fever, chills. 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 on 10/06/15, pt with increased agitation presumably due to withdrawal.  She was intubated.  Repeat MRI 4/1 showed evolving large Rt MCA infarct and evolving small bil occipital infarcts.  Chest CT 4/5 showed cavitary lesions bil. lungs and bil pleural effusions.  Tracheostomy 4/6.   PT Comments    Pt trying to get OOB upon entry to room, pointing to bathroom and saturated in urine. Pt able to stand pivot transfer with Mod A of one but has heavy posterior lean. HR increased to 152 bpm with activity but Sats remained above 90% on 28% trach collar. Pt following commands appropriately and pointing to things that she needs. Pt is somewhat restless and impulsive which is a safety concern as she is a high fall risk. Pt continues to make progress with therapy and will continue to benefit from PT services to work towards improving her functional mobility and independence. Pt remains a great candidate for CIR to work towards more independence with functional mobility, and to decrease fall risk.     Follow Up Recommendations  CIR;Supervision/Assistance - 24 hour     Equipment Recommendations  Other (comment) (TBD by next level of care. )    Recommendations for Other Services Rehab consult     Precautions / Restrictions Precautions Precautions: Fall Restrictions Weight Bearing Restrictions: No    Mobility  Bed Mobility               General bed mobility comments:  Pt sitting on EOB upon entry to room.   Transfers Overall transfer level: Needs assistance Equipment used: 2 person hand held assist Transfers: Sit to/from [employer] CorporationStand;Stand Pivot Transfers Sit to Stand: Mod assist Stand pivot transfers: Mod assist       General transfer comment: Pt pointing to bathroom upon entry to room, actively trying to get OOB, so helped pt perform stand pivot to bedside commode, Then OT stood pt again after she was finshed and we replaced the bedside commode with the recliner. Applied Posey belt restraint, R arm restraint, and R hand mit as pt tries to get up on her own alot and tries to pull at lines with her R arm. Pt has heavy posterior lean with transfers. Requires verbal and tactile cues for initiation and seuencing of tasks.   Ambulation/Gait                 Stairs            Wheelchair Mobility    Modified Rankin (Stroke Patients Only)       Balance Overall balance assessment: Needs assistance Sitting-balance support: Bilateral upper extremity supported;Feet unsupported Sitting balance-Leahy Scale: Fair Sitting balance - Comments: Pt actively trying to get OOB upon entry to room, sitting on the EOB with posterior lean.  Postural control: Posterior lean Standing balance support: Bilateral upper extremity supported Standing balance-Leahy Scale: Zero Standing balance comment: Reliant on UE support and Mod A to remain upright. Heavy posterior lean.  Cognition Arousal/Alertness: Awake/alert Behavior During Therapy: Impulsive Overall Cognitive Status: Difficult to assess Area of Impairment: Attention;Following commands;Safety/judgement;Awareness   Current Attention Level: Focused Memory: Decreased recall of precautions Following Commands: Follows one step commands consistently Safety/Judgement: Decreased awareness of safety;Decreased awareness of deficits   Problem Solving: Slow processing;Decreased  initiation;Difficulty sequencing;Requires verbal cues;Requires tactile cues General Comments: Pt following commands appropriately. Pt actively trying to get OOB upon entry to room. Pt's heart leads and pulse ox had been pulled off and R restraint was not on or the hand mit. She was pointing to her groin and pointing to the bathroom.     Exercises      General Comments General comments (skin integrity, edema, etc.): Pt on EOB pointing at bathroom upon entry to room, saturated in urine. Helped pt clean up and change gown on bedside commode and pulled all saturated linen off bed.       Pertinent Vitals/Pain Pain Assessment: No/denies pain    Home Living                      Prior Function            PT Goals (current goals can now be found in the care plan section) Acute Rehab PT Goals Patient Stated Goal: none stated PT Goal Formulation: Patient unable to participate in goal setting Time For Goal Achievement: 11/04/15 Potential to Achieve Goals: Good Progress towards PT goals: Progressing toward goals    Frequency  Min 4X/week    PT Plan Current plan remains appropriate    Co-evaluation             End of Session Equipment Utilized During Treatment: Gait belt;Oxygen Activity Tolerance: Patient tolerated treatment well Patient left: in chair;with call bell/phone within reach;with chair alarm set;with restraints reapplied     Time: 9604-5409 PT Time Calculation (min) (ACUTE ONLY): 39 min  Charges:  $Therapeutic Activity: 23-37 mins                    G Codes:      Everlean Cherry, SPT Everlean Cherry 10/26/2015, 11:54 AM

## 2015-11-22 ENCOUNTER — Encounter: Payer: Self-pay | Admitting: Adult Health

## 2015-11-22 ENCOUNTER — Non-Acute Institutional Stay (SKILLED_NURSING_FACILITY): Payer: Medicaid Other | Admitting: Adult Health

## 2015-11-22 DIAGNOSIS — I2690 Septic pulmonary embolism without acute cor pulmonale: Secondary | ICD-10-CM

## 2015-11-22 DIAGNOSIS — I5022 Chronic systolic (congestive) heart failure: Secondary | ICD-10-CM | POA: Diagnosis not present

## 2015-11-22 DIAGNOSIS — I63411 Cerebral infarction due to embolism of right middle cerebral artery: Secondary | ICD-10-CM

## 2015-11-22 DIAGNOSIS — K219 Gastro-esophageal reflux disease without esophagitis: Secondary | ICD-10-CM | POA: Insufficient documentation

## 2015-11-22 DIAGNOSIS — N809 Endometriosis, unspecified: Secondary | ICD-10-CM

## 2015-11-22 DIAGNOSIS — A419 Sepsis, unspecified organism: Secondary | ICD-10-CM

## 2015-11-22 DIAGNOSIS — G060 Intracranial abscess and granuloma: Secondary | ICD-10-CM

## 2015-11-22 DIAGNOSIS — I38 Endocarditis, valve unspecified: Secondary | ICD-10-CM | POA: Insufficient documentation

## 2015-11-22 DIAGNOSIS — I76 Septic arterial embolism: Secondary | ICD-10-CM
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
## 2015-11-24 ENCOUNTER — Non-Acute Institutional Stay (SKILLED_NURSING_FACILITY): Payer: Medicaid Other | Admitting: Internal Medicine

## 2015-11-24 ENCOUNTER — Encounter: Payer: Self-pay | Admitting: Internal Medicine

## 2015-11-24 DIAGNOSIS — A4101 Sepsis due to Methicillin susceptible Staphylococcus aureus: Secondary | ICD-10-CM

## 2015-11-24 DIAGNOSIS — F419 Anxiety disorder, unspecified: Secondary | ICD-10-CM

## 2015-11-24 DIAGNOSIS — K219 Gastro-esophageal reflux disease without esophagitis: Secondary | ICD-10-CM

## 2015-11-24 DIAGNOSIS — I5022 Chronic systolic (congestive) heart failure: Secondary | ICD-10-CM | POA: Diagnosis not present

## 2015-11-24 DIAGNOSIS — J9601 Acute respiratory failure with hypoxia: Secondary | ICD-10-CM

## 2015-11-24 DIAGNOSIS — I638 Other cerebral infarction: Secondary | ICD-10-CM | POA: Diagnosis not present

## 2015-11-24 DIAGNOSIS — J1569 Pneumonia due to other gram-negative bacteria: Secondary | ICD-10-CM

## 2015-11-24 DIAGNOSIS — G060 Intracranial abscess and granuloma: Secondary | ICD-10-CM

## 2015-11-24 DIAGNOSIS — R9431 Abnormal electrocardiogram [ECG] [EKG]: Secondary | ICD-10-CM

## 2015-11-24 DIAGNOSIS — L03119 Cellulitis of unspecified part of limb: Secondary | ICD-10-CM

## 2015-11-24 DIAGNOSIS — D649 Anemia, unspecified: Secondary | ICD-10-CM

## 2015-11-24 DIAGNOSIS — F1910 Other psychoactive substance abuse, uncomplicated: Secondary | ICD-10-CM

## 2015-11-24 DIAGNOSIS — I639 Cerebral infarction, unspecified: Secondary | ICD-10-CM

## 2015-11-24 DIAGNOSIS — I63411 Cerebral infarction due to embolism of right middle cerebral artery: Secondary | ICD-10-CM

## 2015-11-24 DIAGNOSIS — I4581 Long QT syndrome: Secondary | ICD-10-CM

## 2015-11-24 DIAGNOSIS — B3749 Other urogenital candidiasis: Secondary | ICD-10-CM

## 2015-11-24 DIAGNOSIS — J90 Pleural effusion, not elsewhere classified: Secondary | ICD-10-CM

## 2015-11-24 DIAGNOSIS — I38 Endocarditis, valve unspecified: Secondary | ICD-10-CM | POA: Diagnosis not present

## 2015-11-24 DIAGNOSIS — I6389 Other cerebral infarction: Secondary | ICD-10-CM

## 2015-11-24 DIAGNOSIS — J156 Pneumonia due to other aerobic Gram-negative bacteria: Secondary | ICD-10-CM

## 2015-11-24 DIAGNOSIS — I2690 Septic pulmonary embolism without acute cor pulmonale: Secondary | ICD-10-CM

## 2015-11-24 DIAGNOSIS — J189 Pneumonia, unspecified organism: Secondary | ICD-10-CM | POA: Diagnosis not present

## 2015-11-24 DIAGNOSIS — L02419 Cutaneous abscess of limb, unspecified: Secondary | ICD-10-CM

## 2015-11-24 DIAGNOSIS — Z4659 Encounter for fitting and adjustment of other gastrointestinal appliance and device: Secondary | ICD-10-CM

## 2015-11-24 DIAGNOSIS — Z452 Encounter for adjustment and management of vascular access device: Secondary | ICD-10-CM

## 2015-11-24 DIAGNOSIS — N170 Acute kidney failure with tubular necrosis: Secondary | ICD-10-CM

## 2015-11-24 DIAGNOSIS — Z87898 Personal history of other specified conditions: Secondary | ICD-10-CM

## 2015-11-24 DIAGNOSIS — J158 Pneumonia due to other specified bacteria: Secondary | ICD-10-CM | POA: Diagnosis not present
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
## 2015-11-27 DIAGNOSIS — F419 Anxiety disorder, unspecified: Secondary | ICD-10-CM

## 2015-11-27 DIAGNOSIS — F329 Major depressive disorder, single episode, unspecified: Secondary | ICD-10-CM | POA: Insufficient documentation

## 2015-11-27 NOTE — Assessment & Plan Note (Signed)
SNF - 2/2 to critical illness and poor po intake ;resolved with IVF; Cr -.56 upon d/c [facility] ; will f/u BMP

## 2015-11-27 NOTE — Assessment & Plan Note (Signed)
SNF - chronic from IVDA;cont clonopin 2 mg q8; goal would be to decrease use

## 2015-11-27 NOTE — Assessment & Plan Note (Signed)
SNF - Hb at d/[facility] was 8.5, do not have d/c hb from [facility];will f/u CBC

## 2015-11-27 NOTE — Assessment & Plan Note (Signed)
SNF - follow ECG while pt is on seroquel

## 2015-11-27 NOTE — Assessment & Plan Note (Signed)
SNF - not staed as uncontrolled; cont pepcid 20 mg BID

## 2015-11-30 ENCOUNTER — Non-Acute Institutional Stay (SKILLED_NURSING_FACILITY): Payer: Medicaid Other | Admitting: Adult Health

## 2015-11-30 ENCOUNTER — Encounter: Payer: Self-pay | Admitting: Adult Health

## 2015-11-30 DIAGNOSIS — K122 Cellulitis and abscess of mouth: Secondary | ICD-10-CM | POA: Diagnosis not present
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
## 2015-12-06 ENCOUNTER — Encounter: Payer: Self-pay | Admitting: Physician Assistant

## 2015-12-07 DIAGNOSIS — Z8679 Personal history of other diseases of the circulatory system: Secondary | ICD-10-CM | POA: Insufficient documentation

## 2015-12-07 DIAGNOSIS — Z8673 Personal history of transient ischemic attack (TIA), and cerebral infarction without residual deficits: Secondary | ICD-10-CM | POA: Insufficient documentation

## 2015-12-07 DIAGNOSIS — I071 Rheumatic tricuspid insufficiency: Secondary | ICD-10-CM | POA: Insufficient documentation
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
## 2015-12-08 ENCOUNTER — Ambulatory Visit (INDEPENDENT_AMBULATORY_CARE_PROVIDER_SITE_OTHER): Payer: Medicaid Other | Admitting: Physician Assistant

## 2015-12-08 ENCOUNTER — Encounter: Payer: Self-pay | Admitting: Physician Assistant

## 2015-12-08 VITALS — BP 110/50 | HR 104 | Ht 68.0 in | Wt 128.1 lb

## 2015-12-08 DIAGNOSIS — Z8673 Personal history of transient ischemic attack (TIA), and cerebral infarction without residual deficits: Secondary | ICD-10-CM

## 2015-12-08 DIAGNOSIS — Z8669 Personal history of other diseases of the nervous system and sense organs: Secondary | ICD-10-CM

## 2015-12-08 DIAGNOSIS — I071 Rheumatic tricuspid insufficiency: Secondary | ICD-10-CM

## 2015-12-08 DIAGNOSIS — Z8679 Personal history of other diseases of the circulatory system: Secondary | ICD-10-CM

## 2015-12-08 DIAGNOSIS — I4581 Long QT syndrome: Secondary | ICD-10-CM | POA: Diagnosis not present

## 2015-12-08 DIAGNOSIS — R9431 Abnormal electrocardiogram [ECG] [EKG]: Secondary | ICD-10-CM

## 2015-12-08 DIAGNOSIS — F1910 Other psychoactive substance abuse, uncomplicated: Secondary | ICD-10-CM

## 2015-12-08 DIAGNOSIS — B999 Unspecified infectious disease: Secondary | ICD-10-CM

## 2015-12-08 NOTE — Patient Instructions (Addendum)
Medication Instructions:  1. CHANGE LASIX TO TAKE ONLY AS NEEDED FOR SWELLING  2. CHANGE POTASSIUM TO TAKE ONLY AS NEEDED FOR SWELLING WHEN YOU TAKE THE LASIX Labwork: NONE Testing/Procedures: Your physician has requested that you have an echocardiogram. Echocardiography is a painless test that uses sound waves to create images of your heart. It provides your doctor with information about the size and shape of your heart and how well your heart's chambers and valves are working. This procedure takes approximately one hour. There are no restrictions for this procedure. Follow-Up: 1. YOU ARE BEING REFERRED TO NEUROLOGY 2. AS NEEDED FOLLOW UP WITH DR. Anne FuSKAINS  Any Other Special Instructions Will Be Listed Below (If Applicable). YOU HAVE BEEN GIVEN A SBE CARD ; MAKE SURE TO CARRY THIS CARD WITH YOU AT ALL TIMES; YOU WILL WANT TO MAKE SURE YOUR DENTIST HAS THIS CARD ON FILE AS WELL.   If you need a refill on your cardiac medications before your next appointment, please call your pharmacy.

## 2015-12-20 ENCOUNTER — Encounter: Payer: Self-pay | Admitting: Adult Health

## 2015-12-20 ENCOUNTER — Non-Acute Institutional Stay (SKILLED_NURSING_FACILITY): Payer: Medicaid Other | Admitting: Adult Health

## 2015-12-20 DIAGNOSIS — G060 Intracranial abscess and granuloma: Secondary | ICD-10-CM

## 2015-12-20 DIAGNOSIS — I638 Other cerebral infarction: Secondary | ICD-10-CM | POA: Diagnosis not present

## 2015-12-20 DIAGNOSIS — A4101 Sepsis due to Methicillin susceptible Staphylococcus aureus: Secondary | ICD-10-CM | POA: Diagnosis not present

## 2015-12-20 DIAGNOSIS — I6389 Other cerebral infarction: Secondary | ICD-10-CM
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
## 2015-12-26 ENCOUNTER — Ambulatory Visit (HOSPITAL_COMMUNITY): Payer: Medicaid Other | Attending: Internal Medicine

## 2015-12-26 ENCOUNTER — Encounter: Payer: Self-pay | Admitting: Physician Assistant

## 2015-12-26 ENCOUNTER — Other Ambulatory Visit: Payer: Self-pay

## 2015-12-26 DIAGNOSIS — I351 Nonrheumatic aortic (valve) insufficiency: Secondary | ICD-10-CM | POA: Insufficient documentation

## 2015-12-26 DIAGNOSIS — I071 Rheumatic tricuspid insufficiency: Secondary | ICD-10-CM | POA: Diagnosis not present

## 2015-12-26 DIAGNOSIS — I517 Cardiomegaly: Secondary | ICD-10-CM | POA: Insufficient documentation

## 2015-12-26 DIAGNOSIS — Z8679 Personal history of other diseases of the circulatory system: Secondary | ICD-10-CM | POA: Diagnosis not present

## 2015-12-26 DIAGNOSIS — Z720 Tobacco use: Secondary | ICD-10-CM | POA: Insufficient documentation

## 2015-12-26 LAB — ECHOCARDIOGRAM COMPLETE
AOASC: 28 cm
AVPHT: 394 ms
E decel time: 141 msec
EERAT: 4.42
FS: 23 % — AB (ref 28–44)
IVS/LV PW RATIO, ED: 1.51
LA diam end sys: 33 mm
LA diam index: 2.06 cm/m2
LA vol A4C: 28.8 ml
LA vol index: 21.3 mL/m2
LASIZE: 33 mm
LAVOL: 34 mL
LV PW d: 7.24 mm — AB (ref 0.6–1.1)
LV TDI E'LATERAL: 20.2
LV TDI E'MEDIAL: 10.9
LV e' LATERAL: 20.2 cm/s
LVEEAVG: 4.42
LVEEMED: 4.42
LVOT SV: 75 mL
LVOT VTI: 19.8 cm
LVOT area: 3.8 cm2
LVOT diameter: 22 mm
LVOTPV: 93.1 cm/s
MV Dec: 141
MV pk E vel: 89.2 m/s
MVPG: 3 mmHg
MVPKAVEL: 62.6 m/s
Reg peak vel: 214 cm/s
TR max vel: 214 cm/s

## 2015-12-27 ENCOUNTER — Telehealth: Payer: Self-pay | Admitting: *Deleted

## 2015-12-27 NOTE — Telephone Encounter (Signed)
Cassandra Wall with [facility] called and stated that they needed to confirm dosage of written Rx Budesonide by Cassandra Wall. They stated that they had written Rx's they needed to clarify. Instructed them to call [facility] and gave them the number due to patient being a resident discharged from the facility. They agreed and will call the facility.

## 2015-12-28 ENCOUNTER — Telehealth: Payer: Self-pay | Admitting: Cardiology

## 2015-12-28 NOTE — Telephone Encounter (Signed)
New Message  Pt mother call requesting to speak with RN about pt ECHO results.  Pt mother stated she has a couple of questions about a medical clearence for dental work, mother wanted to know if pt wou;d be strong enough to get the procedure.  Pt mother also states she misplaced her ID, and making sure it was not found in the office. Please call back to discuss

## 2015-12-28 NOTE — Telephone Encounter (Signed)
Spoke with patient's mother, Lawson FiscalLori, who states they would like Dr. Anne FuSkains to review the echo. She states they were not pleased with their visit with Tereso NewcomerScott Weaver, PA and they want Dr. Anne FuSkains' advice on the echo and the last visit with Tereso NewcomerScott Weaver.  She also states patient needs extensive dental work.  I advised her that the dentists' office should fax clearance request to us and that patient will likely need SBE prophylaxis.  I advised her that I will forward message to Dr. Anne FuSkains and his primary nurse, Avie ArenasPam Fleming, RN, for a call back when Dr. Anne FuSkains has reviewed.  She verbalized understanding and agreement with plan.

## 2015-12-29 MED ORDER — AMOXICILLIN 500 MG PO CAPS: ORAL_CAPSULE | ORAL | Status: DC

## 2015-12-29 NOTE — Telephone Encounter (Addendum)
Reviewed Dr. Anne FuSkains advice and the echo report with patient's mother.  I sent Rx for amoxicillin 2 g to be taken prior to dental work.  She is aware to call to report and/or schedule follow-up if patient exhibits worsening chest pain, SOB or other concerns.  She requests a handicap parking placard to be completed by Dr. Anne FuSkains.  I advised her that he will be back in the office next week and that I will forward request to him.  I advised someone from our office will call her when it is signed.  She verbalized understanding and agreement with plan and thanked me for the call.

## 2015-12-29 NOTE — Telephone Encounter (Signed)
Echocardiogram personally reviewed. Agree with findings as reported.  No further change in management.  She will need dental antibiotic prophylaxis. She may proceed with dental work.  Donato SchultzMark Skains, MD

## 2015-12-29 NOTE — Addendum Note (Signed)
Addended by: Levi AlandSWINYER, MICHELLE M on: 12/29/2015 05:42 PM   Modules accepted: Orders

## 2015-12-30 NOTE — Telephone Encounter (Signed)
OK for handicap placard

## 2016-01-06 ENCOUNTER — Telehealth: Payer: Self-pay | Admitting: Physician Assistant

## 2016-01-06 NOTE — Telephone Encounter (Signed)
Walk in pt form-Disability Pla-Card Application-Dropped off gave to Cassandra Wall

## 2016-01-06 NOTE — Telephone Encounter (Signed)
S/w pt's mother (DPR) advised per Dr. Anne FuSkains Handicap Placard that she will need to obtain this from PCP. Mother agreeeable. I advised pt's mother I will put form at front desk for to pick up. Mother stated no that she has other copies of the form.

## 2016-01-06 NOTE — Telephone Encounter (Signed)
Pt should obtain from PCP per Dr Anne FuSkains.

## 2016-01-11 ENCOUNTER — Telehealth: Payer: Self-pay | Admitting: Physician Assistant

## 2016-01-11 DIAGNOSIS — I63411 Cerebral infarction due to embolism of right middle cerebral artery: Secondary | ICD-10-CM

## 2016-01-11 NOTE — Telephone Encounter (Signed)
New Message  Pt mother calling to speak w/ rN- [facility] for earlier appt- needed referall faxed ASAP- [phone]. Please advise

## 2016-01-11 NOTE — Telephone Encounter (Signed)
Mother aware of new referral to [facility].

## 2016-01-11 NOTE — Telephone Encounter (Signed)
Pt called and asked for referral to Neuro that [facility], [facility] at last ov to be changeTereso Newcomerd [facility] so they can get in faster. I have placed a new referral and will have [facility] check on getting an appt.

## 2016-01-12 ENCOUNTER — Ambulatory Visit (INDEPENDENT_AMBULATORY_CARE_PROVIDER_SITE_OTHER): Payer: Medicaid Other | Admitting: Neurology

## 2016-01-12 ENCOUNTER — Encounter: Payer: Self-pay | Admitting: Neurology

## 2016-01-12 VITALS — BP 118/70 | HR 114 | Ht 68.0 in | Wt 149.5 lb

## 2016-01-12 DIAGNOSIS — I69359 Hemiplegia and hemiparesis following cerebral infarction affecting unspecified side: Secondary | ICD-10-CM

## 2016-01-12 DIAGNOSIS — Z8679 Personal history of other diseases of the circulatory system: Secondary | ICD-10-CM | POA: Diagnosis not present

## 2016-01-12 DIAGNOSIS — I69398 Other sequelae of cerebral infarction: Secondary | ICD-10-CM

## 2016-01-12 DIAGNOSIS — R569 Unspecified convulsions: Secondary | ICD-10-CM | POA: Diagnosis not present

## 2016-01-12 HISTORY — DX: Hemiplegia and hemiparesis following cerebral infarction affecting unspecified side: I69.398

## 2016-01-12 HISTORY — DX: Unspecified convulsions: R56.9

## 2016-01-12 HISTORY — DX: Other sequelae of cerebral infarction: I69.359

## 2016-01-12 MED ORDER — TOPIRAMATE 25 MG PO TABS: ORAL_TABLET | ORAL | Status: DC
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
## 2016-01-12 NOTE — Patient Instructions (Signed)
   Topamax (topiramate) is a seizure medication that has an FDA approval for seizures and for migraine headache. Potential side effects of this medication include weight loss, cognitive slowing, tingling in the fingers and toes, and carbonated drinks will taste bad. If any significant side effects are noted on this drug, please contact our office.  

## 2016-01-18 ENCOUNTER — Telehealth: Payer: Self-pay | Admitting: Neurology

## 2016-01-18 NOTE — Telephone Encounter (Signed)
Fax received and completed. Awaiting MD signature.

## 2016-01-18 NOTE — Telephone Encounter (Signed)
Patient called regarding prescription Dr. Anne HahnWillis wrote for wheelchair, patient states [facility] needs more information, please call [phone].

## 2016-01-18 NOTE — Telephone Encounter (Signed)
Returned call to pt and spoke to pt's mother. She reports that [facility] said that they faxed form for additional information on w/c on 01/13/16. Our office has not yet received fax. [facility] and spoke to [place]ie who agreed to re-fax forms.

## 2016-01-18 NOTE — Telephone Encounter (Signed)
The patient had an ophthalmologic evaluation by Dr. Druscilla BrowniePorfilio at [facility]. The examination was unremarkable, the patient has normal vision, normal visual fields,   Retinal examination was unremarkable.

## 2016-01-19 NOTE — Telephone Encounter (Signed)
Orders/forms signed [facility].

## 2016-01-24 ENCOUNTER — Ambulatory Visit: Payer: Medicaid Other | Admitting: Family Medicine

## 2016-01-25 ENCOUNTER — Telehealth: Payer: Self-pay | Admitting: Neurology

## 2016-01-25 NOTE — Telephone Encounter (Signed)
[place]/Dr. Barbette MerinoJensen [phone] called regarding medical clearance.

## 2016-01-25 NOTE — Telephone Encounter (Signed)
Pt needs letter stating she is cleared to have dental procedure sent to [facility] Allean FoundScott Jenkins (oral [facility] [address]

## 2016-01-26 ENCOUNTER — Encounter: Payer: Self-pay | Admitting: Neurology

## 2016-01-26 NOTE — Telephone Encounter (Signed)
A letter was written. No neurologic contraindications for dental extractions, the patient should have prophylactic antibiotic therapy, however.

## 2016-01-26 NOTE — Telephone Encounter (Signed)
Spoke to Evalynne [facility] - pt will be having six teeth extracted under IV sedation.  Dr. York RamJenkin's office requires a letter of clearance prior to her procedure.  Lelon MastSamantha would like this faxed to her at [phone].

## 2016-01-26 NOTE — Telephone Encounter (Signed)
A letter was written for clearance.

## 2016-01-26 NOTE — Telephone Encounter (Signed)
Left message for Dr. York RamJenkin's office to return my call.

## 2016-01-26 NOTE — Telephone Encounter (Signed)
Returned call to San[facility]-SCISamantha [phone]) - she is aware the instructions below and confirmed they received the letter of clearance.  Pt aware this has been done.

## 2016-02-08 ENCOUNTER — Ambulatory Visit: Payer: Medicaid Other | Admitting: Neurology

## 2016-02-14 ENCOUNTER — Other Ambulatory Visit: Payer: Medicaid Other

## 2016-02-21 ENCOUNTER — Ambulatory Visit (INDEPENDENT_AMBULATORY_CARE_PROVIDER_SITE_OTHER): Payer: Medicaid Other | Admitting: Diagnostic Neuroimaging

## 2016-02-21 DIAGNOSIS — I69359 Hemiplegia and hemiparesis following cerebral infarction affecting unspecified side: Secondary | ICD-10-CM

## 2016-02-21 DIAGNOSIS — I69398 Other sequelae of cerebral infarction: Secondary | ICD-10-CM

## 2016-02-21 DIAGNOSIS — G40219 Localization-related (focal) (partial) symptomatic epilepsy and epileptic syndromes with complex partial seizures, intractable, without status epilepticus: Secondary | ICD-10-CM | POA: Diagnosis not present

## 2016-02-23 ENCOUNTER — Telehealth: Payer: Self-pay | Admitting: Neurology

## 2016-02-23 NOTE — Procedures (Signed)
     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 The patient has had a seizure-type episodes associated with staring events, unable to respond. The patient is being evaluated for these seizure episodes.  This is a routine EEG. No skull defects are noted. Medications include Tylenol, aspirin, Pulmicort, Suboxone, clonazepam, Pepcid, Lasix, lamotrigine, metoprolol, Remeron, and Seroquel.  EEG classification: Dysrhythmia grade 1 right hemisphere  Description of the recording: The background rhythms of this recording consists of a well modulated medium amplitude alpha rhythm of 10 Hz that is reactive to eye opening and closure. As the record progresses, slowing is seen over the right hemisphere in the 7 Hz range. At times during the recording, the patient appears to enter the drowsy state and there is more generalized theta slowing seen. The patient never enters stage II sleep. Photic stimulation is performed, this does result in a bilateral and symmetric photic driving response. Hyperventilation is then performed, minimal buildup of the background rhythm activities is seen. The patient appears to have episodes of more generalized higher amplitude theta slowing at times during the recording. At no time does there appear to be evidence of actual spike or spike-wave discharges. EKG monitor shows no evidence of cardiac rhythm abnormalities with a heart rate of 90.  Impression: This is an abnormal EEG recording secondary to right hemispheric theta frequency slowing. Intermittent more generalized theta slowing is seen, no definite epileptiform discharges are seen. This study suggests a right brain abnormality, and correlates with the history of a right middle cerebral artery distribution stroke.

## 2016-02-23 NOTE — Telephone Encounter (Signed)
I called the patient. The EEG showed mild right brain slowing. No seizure activity noted.  The patient is still having episodes of staring, usually occurring with periods of stress. The patient has only gone to 25 mg twice daily with the Topamax, she is to go to 50 mg twice daily at this time. She will call me if she is still having episodes of staring.

## 2016-02-23 NOTE — Telephone Encounter (Signed)
Please refer to EEG procedure note. 

## 2016-02-23 NOTE — Procedures (Deleted)
     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 The patient has had a seizure-type episodes associated with staring events, unable to respond. The patient is being evaluated for these seizure episodes.  This is a routine EEG. No skull defects are noted. Medications include Tylenol, aspirin, Pulmicort, Suboxone, clonazepam, Pepcid, Lasix, lamotrigine, metoprolol, Remeron, and Seroquel.  EEG classification: Dysrhythmia grade 1 right hemisphere  Description of the recording: The background rhythms of this recording consists of a well modulated medium amplitude alpha rhythm of 10 Hz that is reactive to eye opening and closure. As the record progresses, slowing is seen over the right hemisphere in the 7 Hz range. At times during the recording, the patient appears to enter the drowsy state and there is more generalized theta slowing seen. The patient never enters stage II sleep. Photic stimulation is performed, this does result in a bilateral and symmetric photic driving response. Hyperventilation is then performed, minimal buildup of the background rhythm activities is seen. The patient appears to have episodes of more generalized higher amplitude theta slowing at times during the recording. At no time does there appear to be evidence of actual spike or spike-wave discharges. EKG monitor shows no evidence of cardiac rhythm abnormalities with a heart rate of 90.  Impression: This is an abnormal EEG recording secondary to right hemispheric theta frequency slowing. Intermittent more generalized theta slowing is seen, no definite epileptiform discharges are seen. This study suggests a right brain abnormality, and correlates with the history of a right middle cerebral artery distribution stroke.    

## 2016-03-13 ENCOUNTER — Observation Stay (HOSPITAL_COMMUNITY)
Admission: EM | Admit: 2016-03-13 | Discharge: 2016-03-14 | Disposition: A | Discharge status: Home / Self Care | Payer: Medicaid Other | Attending: Internal Medicine | Admitting: Internal Medicine

## 2016-03-13 ENCOUNTER — Encounter (HOSPITAL_COMMUNITY): Payer: Self-pay | Admitting: Emergency Medicine

## 2016-03-13 ENCOUNTER — Observation Stay (HOSPITAL_COMMUNITY): Payer: Medicaid Other

## 2016-03-13 ENCOUNTER — Telehealth: Payer: Self-pay | Admitting: Neurology

## 2016-03-13 ENCOUNTER — Other Ambulatory Visit: Payer: Self-pay

## 2016-03-13 DIAGNOSIS — R531 Weakness: Principal | ICD-10-CM | POA: Insufficient documentation

## 2016-03-13 DIAGNOSIS — D649 Anemia, unspecified: Secondary | ICD-10-CM | POA: Diagnosis not present

## 2016-03-13 DIAGNOSIS — Z8673 Personal history of transient ischemic attack (TIA), and cerebral infarction without residual deficits: Secondary | ICD-10-CM

## 2016-03-13 DIAGNOSIS — K219 Gastro-esophageal reflux disease without esophagitis: Secondary | ICD-10-CM | POA: Diagnosis not present

## 2016-03-13 DIAGNOSIS — H02401 Unspecified ptosis of right eyelid: Secondary | ICD-10-CM | POA: Diagnosis not present

## 2016-03-13 DIAGNOSIS — R42 Dizziness and giddiness: Secondary | ICD-10-CM | POA: Diagnosis not present

## 2016-03-13 DIAGNOSIS — R410 Disorientation, unspecified: Secondary | ICD-10-CM | POA: Insufficient documentation

## 2016-03-13 DIAGNOSIS — M25512 Pain in left shoulder: Secondary | ICD-10-CM | POA: Insufficient documentation

## 2016-03-13 DIAGNOSIS — F1910 Other psychoactive substance abuse, uncomplicated: Secondary | ICD-10-CM | POA: Insufficient documentation

## 2016-03-13 DIAGNOSIS — R471 Dysarthria and anarthria: Secondary | ICD-10-CM

## 2016-03-13 DIAGNOSIS — F17210 Nicotine dependence, cigarettes, uncomplicated: Secondary | ICD-10-CM | POA: Insufficient documentation

## 2016-03-13 DIAGNOSIS — I38 Endocarditis, valve unspecified: Secondary | ICD-10-CM | POA: Diagnosis present

## 2016-03-13 DIAGNOSIS — F418 Other specified anxiety disorders: Secondary | ICD-10-CM | POA: Diagnosis not present

## 2016-03-13 DIAGNOSIS — R8290 Unspecified abnormal findings in urine: Secondary | ICD-10-CM | POA: Diagnosis not present

## 2016-03-13 DIAGNOSIS — Z8679 Personal history of other diseases of the circulatory system: Secondary | ICD-10-CM

## 2016-03-13 DIAGNOSIS — F329 Major depressive disorder, single episode, unspecified: Secondary | ICD-10-CM | POA: Insufficient documentation

## 2016-03-13 DIAGNOSIS — I2510 Atherosclerotic heart disease of native coronary artery without angina pectoris: Secondary | ICD-10-CM | POA: Insufficient documentation

## 2016-03-13 DIAGNOSIS — N189 Chronic kidney disease, unspecified: Secondary | ICD-10-CM | POA: Diagnosis not present

## 2016-03-13 DIAGNOSIS — R7401 Elevation of levels of liver transaminase levels: Secondary | ICD-10-CM | POA: Diagnosis present

## 2016-03-13 DIAGNOSIS — F419 Anxiety disorder, unspecified: Secondary | ICD-10-CM | POA: Insufficient documentation

## 2016-03-13 DIAGNOSIS — R740 Nonspecific elevation of levels of transaminase and lactic acid dehydrogenase [LDH]: Secondary | ICD-10-CM

## 2016-03-13 LAB — COMPREHENSIVE METABOLIC PANEL
ALK PHOS: 100 U/L (ref 38–126)
ALT: 166 U/L — AB (ref 14–54)
AST: 125 U/L — ABNORMAL HIGH (ref 15–41)
Albumin: 3.8 g/dL (ref 3.5–5.0)
Anion gap: 7 (ref 5–15)
BILIRUBIN TOTAL: 0.5 mg/dL (ref 0.3–1.2)
BUN: 14 mg/dL (ref 6–20)
CALCIUM: 9.7 mg/dL (ref 8.9–10.3)
CHLORIDE: 113 mmol/L — AB (ref 101–111)
CO2: 21 mmol/L — ABNORMAL LOW (ref 22–32)
CREATININE: 0.83 mg/dL (ref 0.44–1.00)
Glucose, Bld: 79 mg/dL (ref 65–99)
Potassium: 4.8 mmol/L (ref 3.5–5.1)
Sodium: 141 mmol/L (ref 135–145)
TOTAL PROTEIN: 6.9 g/dL (ref 6.5–8.1)

## 2016-03-13 LAB — CBC WITH DIFFERENTIAL/PLATELET
BASOS ABS: 0 10*3/uL (ref 0.0–0.1)
BASOS PCT: 0 %
EOS ABS: 0.1 10*3/uL (ref 0.0–0.7)
Eosinophils Relative: 1 %
HCT: 41.9 % (ref 36.0–46.0)
HEMOGLOBIN: 12.8 g/dL (ref 12.0–15.0)
Lymphocytes Relative: 45 %
Lymphs Abs: 2.5 10*3/uL (ref 0.7–4.0)
MCH: 26.3 pg (ref 26.0–34.0)
MCHC: 30.5 g/dL (ref 30.0–36.0)
MCV: 86.2 fL (ref 78.0–100.0)
Monocytes Absolute: 0.5 10*3/uL (ref 0.1–1.0)
Monocytes Relative: 9 %
NEUTROS PCT: 45 %
Neutro Abs: 2.5 10*3/uL (ref 1.7–7.7)
PLATELETS: 165 10*3/uL (ref 150–400)
RBC: 4.86 MIL/uL (ref 3.87–5.11)
RDW: 15.5 % (ref 11.5–15.5)
WBC: 5.6 10*3/uL (ref 4.0–10.5)

## 2016-03-13 LAB — URINALYSIS, ROUTINE W REFLEX MICROSCOPIC
BILIRUBIN URINE: NEGATIVE
Glucose, UA: NEGATIVE mg/dL
HGB URINE DIPSTICK: NEGATIVE
KETONES UR: NEGATIVE mg/dL
NITRITE: NEGATIVE
PH: 7 (ref 5.0–8.0)
Protein, ur: NEGATIVE mg/dL
SPECIFIC GRAVITY, URINE: 1.021 (ref 1.005–1.030)

## 2016-03-13 LAB — URINE MICROSCOPIC-ADD ON: RBC / HPF: NONE SEEN RBC/hpf (ref 0–5)

## 2016-03-13 LAB — RAPID URINE DRUG SCREEN, HOSP PERFORMED
Amphetamines: NOT DETECTED
BARBITURATES: NOT DETECTED
Benzodiazepines: POSITIVE — AB
COCAINE: NOT DETECTED
Opiates: NOT DETECTED
TETRAHYDROCANNABINOL: NOT DETECTED

## 2016-03-13 MED ORDER — PRAZOSIN HCL 2 MG PO CAPS: 2.0000 mg | ORAL_CAPSULE | Freq: Every day | ORAL | Status: DC

## 2016-03-13 MED ORDER — TOPIRAMATE 25 MG PO TABS: 50.0000 mg | ORAL_TABLET | Freq: Every day | ORAL | Status: DC

## 2016-03-13 MED ORDER — PRENATAL PLUS 27-1 MG PO TABS
1.0000 | ORAL_TABLET | Freq: Every day | ORAL | Status: DC
  Administered 2016-03-13: 1 via ORAL
  Filled 2016-03-13 (×2): qty 1

## 2016-03-13 MED ORDER — CLONAZEPAM 1 MG PO TABS: 1.0000 mg | ORAL_TABLET | Freq: Two times a day (BID) | ORAL | Status: DC

## 2016-03-13 MED ORDER — TOPIRAMATE 25 MG PO TABS
50.0000 mg | ORAL_TABLET | Freq: Two times a day (BID) | ORAL | Status: DC
  Administered 2016-03-13: 50 mg via ORAL
  Filled 2016-03-13 (×2): qty 2

## 2016-03-13 MED ORDER — STROKE: EARLY STAGES OF RECOVERY BOOK
Freq: Once | Status: AC
  Administered 2016-03-13: 20:00:00
  Filled 2016-03-13: qty 1

## 2016-03-13 MED ORDER — HYDROXYZINE PAMOATE 25 MG PO CAPS: 25.0000 mg | ORAL_CAPSULE | Freq: Three times a day (TID) | ORAL | Status: DC | PRN

## 2016-03-13 MED ORDER — CLONAZEPAM 1 MG PO TABS
1.0000 mg | ORAL_TABLET | Freq: Two times a day (BID) | ORAL | Status: DC
  Administered 2016-03-13: 1 mg via ORAL
  Filled 2016-03-13 (×2): qty 1

## 2016-03-13 MED ORDER — QUETIAPINE FUMARATE 25 MG PO TABS
50.0000 mg | ORAL_TABLET | Freq: Every day | ORAL | Status: DC
  Administered 2016-03-13: 50 mg via ORAL
  Filled 2016-03-13: qty 2

## 2016-03-13 MED ORDER — ENOXAPARIN SODIUM 40 MG/0.4ML ~~LOC~~ SOLN
40.0000 mg | SUBCUTANEOUS | Status: DC
  Administered 2016-03-13: 40 mg via SUBCUTANEOUS
  Filled 2016-03-13: qty 0.4

## 2016-03-13 NOTE — ED Notes (Signed)
Called MRI about pt MRI. Pt refused to go earlier when they came to get her and chose to go smoke. MRI currently in process with other patients. Pt has left the department against advice several times now to go smoke. Still no IV access.

## 2016-03-13 NOTE — Telephone Encounter (Signed)
Returned call and spoke to pt's mother. Mrs. Eulogio BearSpradley says that she did bring pt to the ER. She is there now waiting to be evaluated. Will call back with additional questions/concerns or if sooner follow-up appt is needed.
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
## 2016-03-13 NOTE — Telephone Encounter (Signed)
The patient is calling stating she passed out last night and today is very confused. The patient states she does not want to go to the hospital. Please call to discuss.

## 2016-03-13 NOTE — Progress Notes (Signed)
Pharmacy Note- PTA Medication Review   After review of listed PTA medications it is unlikely that any of those reported would be the primary cause for elevated LFTs. Several medications have a low risk for causing elevated transaminases with adverse event rates reported in the following: buprenorphine (<1%), clonazepam (transient), and mirtazapine (2%).   Summary: Unlikely that PTA medications reported are contributing to elevated transaminases   Please let us know if you have any further questions.   York CeriseKatherine Cook, PharmD Pharmacy Resident  Pager [phone] 03/13/16 7:32 PM
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
## 2016-03-13 NOTE — ED Notes (Signed)
Report given to Kelly RN 

## 2016-03-13 NOTE — ED Notes (Signed)
Attempted Report 

## 2016-03-13 NOTE — ED Notes (Signed)
Patient transported to MRI 

## 2016-03-13 NOTE — Progress Notes (Signed)
Pharmacy sent multivitamin - prenatal plus instead of multivitamin prenatal, I called pharmacy to notified them and [facility] she said that is the one pharmacy has and is equally to the one ordered for pt so I should go ahead and give it, med given per pharmacy instruction

## 2016-03-13 NOTE — ED Notes (Signed)
Pt is at MRI
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
## 2016-03-13 NOTE — ED Triage Notes (Addendum)
Last night she passed out at table did not hit head   Since then  Has been confused and  Weak and have cp has hx of brain mass

## 2016-03-13 NOTE — ED Notes (Signed)
MD Weston SettleA. Ellis aware pt has no IV access due to flight risk.

## 2016-03-13 NOTE — ED Notes (Signed)
Went to place an IV on patient when she stated she will be sneaking out when staff isn't looking to smoke. MRI transport came for patient but patient reports she Is not ready for MRI. Phlebotomy at bedside to draw blood. Will not be placing an IV on patient when she is expressing intentions to go outside to parking lot to smoke.

## 2016-03-14 ENCOUNTER — Inpatient Hospital Stay (HOSPITAL_COMMUNITY)
Admission: RE | Admit: 2016-03-14 | Discharge: 2016-03-14 | Disposition: A | Discharge status: Home / Self Care | Payer: Medicaid Other | Source: Ambulatory Visit

## 2016-03-14 DIAGNOSIS — H02401 Unspecified ptosis of right eyelid: Secondary | ICD-10-CM

## 2016-03-14 LAB — HEPATITIS PANEL, ACUTE
HCV Ab: >11 s/co ratio — ABNORMAL HIGH (ref 0.0–0.9)
HEP B C IGM: NEGATIVE
Hep A IgM: NEGATIVE
Hepatitis B Surface Ag: NEGATIVE

## 2016-03-14 LAB — URINE CULTURE

## 2016-03-14 LAB — MRSA PCR SCREENING: MRSA by PCR: NEGATIVE

## 2016-03-14 MED ORDER — IBUPROFEN 100 MG/5ML PO SUSP
600.0000 mg | Freq: Once | ORAL | Status: DC
  Filled 2016-03-14 (×2): qty 30

## 2016-03-14 NOTE — Care Management Note (Signed)
Case Management Note  Patient Details  Name: Marygrace DroughtSamantha Daniely MRN: [id number] Date of Birth: 07-Nov-1980  Subjective/Objective:                 In obs <24 hours for drooping eyelid, r/o stroke in patient with history of stroke in March. - CVA, no deficits from home with parent.    Action/Plan:  No CM needs at this time.  Expected Discharge Date:                  Expected Discharge Plan:  Home/Self Care  In-House Referral:  NA  Discharge planning Services  NA  Post Acute Care Choice:  NA Choice offered to:  NA  DME Arranged:  N/A DME Agency:  NA  HH Arranged:  NA HH Agency:  NA  Status of Service:  Completed, signed off  If discussed at Long Length of Stay Meetings, dates discussed:    Additional Comments:  Lawerance SabalDebbie Latacha Texeira, RN 03/14/2016, 10:24 AM

## 2016-03-14 NOTE — Discharge Summary (Signed)
Physician Discharge Summary  Cassandra Wall UEA:[id number] DOB: 1980/09/12 DOA: 03/13/2016  PCP: Jackie Plum, MD  Admit date: 03/13/2016 Discharge date: 03/14/2016  Time spent: 45 minutes  Recommendations for Outpatient Follow-up:  1. PCP in 1 week   Discharge Diagnoses:  Principal Problem:   weakness   IV drug abuse-clean since March 2017   Transaminitis   Normocytic anemia   GERD without esophagitis   Anxiety and depression   History of endocarditis/tricuspid valve   History of embolic stroke   Left anterior shoulder pain   Abnormal urinalysis   Weakness generalized   Discharge Condition: stable  Diet recommendation: heart healthy  There were no vitals filed for this visit.  History of present illness:  Cassandra Wall is a 35 y.o. female with a Past Medical History of anemia, anxiety, depression, ongoing polysubstance abuse, CAD, CVA, seizures who presented with right eye ptosis suspicious for TIA versus psychosomatic complaint as it appears that patient's physical exam and complaints 2 care providers changes with time and based on who was examining the patient.  Hospital Course:  35 year old female with history of endocarditis status post large right MCA infarct on 10/04/2015. Patient returns the ED today secondary to feeling generally weakness and some ptosis of R eye. -Seen by NEurology and felt this was polypharmacy related -MRI completed overnight and was without acute findings, this morning she reported feeling fine and was adamant to be discharged home due to an appointment for her son today. -She also had an MRI shoulder as ordered by Neurology, this showed Moderate tendinosis of the supraspinatus tendon with a high-grade partial-thickness bursal surface tear, I advised her to FU with Physical therapy and Orthopedics for this  Discharge Exam: Vitals:   03/14/16 0557 03/14/16 0800  BP: (!) 117/56 (!) 106/58  Pulse: 84 88  Resp: 18 18  Temp: 97.5 F (36.4  C) 98.2 F (36.8 C)    General: AAOx3 Cardiovascular: S1S2/RRR Respiratory: CTAB  Discharge Instructions   Discharge Instructions    Diet - low sodium heart healthy    Complete by:  As directed   Increase activity slowly    Complete by:  As directed     Discharge Medication List as of 03/14/2016 10:32 AM    CONTINUE these medications which have NOT CHANGED   Details  buprenorphine (SUBUTEX) 8 MG SUBL SL tablet Place 4 mg under the tongue every 12 (twelve) hours., Starting Tue 03/06/2016, Historical Med    clonazePAM (KLONOPIN) 1 MG tablet Take 1 mg by mouth 2 (two) times daily as needed for anxiety. , Historical Med    hydrOXYzine (VISTARIL) 25 MG capsule Take 25 mg by mouth 3 (three) times daily as needed for anxiety. , Historical Med    ibuprofen (ADVIL,MOTRIN) 200 MG tablet Take 200 mg by mouth every 6 (six) hours as needed for moderate pain., Historical Med    mirtazapine (REMERON) 15 MG tablet Take 15 mg by mouth at bedtime., Starting Thu 02/23/2016, Historical Med    Prenatal Vit-Fe Fumarate-FA (PRENATAL PO) Take 1 tablet by mouth daily., Historical Med    QUEtiapine (SEROQUEL) 50 MG tablet Take 50 mg by mouth at bedtime., Starting Thu 02/23/2016, Historical Med    topiramate (TOPAMAX) 25 MG tablet One tablet twice a day for 2 weeks, then take 2 tablets twice a day, Normal      STOP taking these medications     prazosin (MINIPRESS) 2 MG capsule        Allergies  Allergen Reactions  .  Wheat Anaphylaxis  . Ciprofloxacin Hcl     Reports as resistant   . Latex Hives, Itching and Rash      The results of significant diagnostics from this hospitalization (including imaging, microbiology, ancillary and laboratory) are listed below for reference.    Significant Diagnostic Studies: Ct Head Wo Contrast  Result Date: 03/13/2016 CLINICAL DATA:  Dizziness and left frontal headache for 2 weeks EXAM: CT HEAD WITHOUT CONTRAST TECHNIQUE: Contiguous axial images were obtained  from the base of the skull through the vertex without intravenous contrast. COMPARISON:  10/12/2015 FINDINGS: Brain: No evidence of acute infarction, hemorrhage, hydrocephalus, extra-axial collection or mass lesion/mass effect. Old right MCA territory infarction with encephalomalacia. Vascular: No hyperdense vessel or unexpected calcification. Skull: No osseous abnormality. Sinuses/Orbits: Visualized paranasal sinuses are clear. Visualized mastoid sinuses are clear. Visualized orbits demonstrate no focal abnormality. Other: None IMPRESSION: 1. No acute intracranial pathology. 2. Old right MCA territory infarction with encephalomalacia. Electronically Signed   By: Elige Ko   On: 03/13/2016 14:53   Mr Brain Wo Contrast (neuro Protocol)  Result Date: 03/13/2016 CLINICAL DATA:  Dizziness, lightheadedness and syncope. History of IV drug abuse, endocarditis and stroke. EXAM: MRI HEAD WITHOUT CONTRAST TECHNIQUE: Multiplanar, multiecho pulse sequences of the brain and surrounding structures were obtained without intravenous contrast. COMPARISON:  CT HEAD March 13, 2016 at 1446 hours and MRI of the brain October 08, 2015 FINDINGS: INTRACRANIAL CONTENTS: No reduced diffusion to suggest acute ischemia. Spurious reduced diffusion associated with susceptibility artifact within old large RIGHT frontotemporal parietal infarct. Ex vacuo dilatation RIGHT lateral ventricle, no hydrocephalus. Minimal bilateral occipital lobe encephalomalacia corresponding to a old infarcts. No mass effect or masses. No abnormal extra-axial fluid collections. Loss of RIGHT middle cerebral artery flow void corresponding to known occlusion. ORBITS: The included ocular globes and orbital contents are non-suspicious. SINUSES: Fronto ethmoidal sinusitis. Mild bilateral maxillary sinus mucosal thickening. Perforated nasal septum. Mastoid air cells are well aerated. SKULL/SOFT TISSUES: No abnormal sellar expansion. No suspicious calvarial bone marrow  signal. Craniocervical junction maintained. IMPRESSION: No acute intracranial process. Old large RIGHT MCA territory infarct. Old small occipital lobe infarcts. Electronically Signed   By: Awilda Metro M.D.   On: 03/13/2016 18:41   Mr Shoulder Left Wo Contrast  Result Date: 03/13/2016 CLINICAL DATA:  Left shoulder pain anteriorly. EXAM: MRI OF THE LEFT SHOULDER WITHOUT CONTRAST TECHNIQUE: Multiplanar, multisequence MR imaging of the shoulder was performed. No intravenous contrast was administered. COMPARISON:  None. FINDINGS: Rotator cuff: Moderate tendinosis of the supraspinatus tendon with a high-grade partial-thickness bursal surface tear encompassing approximately 90% of the thickness and measuring 10 mm in anterior- posterior dimension. Infraspinatus tendon is intact. Teres minor tendon is intact. Subscapularis tendon is intact. Muscles: No atrophy of the muscles of the rotator cuff. Mild muscle edema in the supraspinatus muscle likely reflecting mild muscle strain. Biceps long head:  Intact. Acromioclavicular Joint: Normal acromioclavicular joint. Type II acromion. Small amount of subacromial/subdeltoid bursal fluid. Glenohumeral Joint: No joint effusion. No chondral defect. Labrum: Grossly intact, but evaluation is limited by lack of intraarticular fluid. Bones: No focal marrow signal abnormality. No fracture or dislocation. Other: No fluid collection or hematoma. IMPRESSION: 1. Moderate tendinosis of the supraspinatus tendon with a high-grade partial-thickness bursal surface tear encompassing approximately 90% of the thickness and measuring 10 mm in anterior- posterior dimension. Electronically Signed   By: Elige Ko   On: 03/13/2016 20:00    Microbiology: Recent Results (from the past 240 hour(s))  Urine  culture     Status: Abnormal   Collection Time: 03/13/16 11:58 AM  Result Value Ref Range Status   Specimen Description URINE, CLEAN CATCH  Final   Special Requests NONE  Final    Culture MULTIPLE SPECIES PRESENT, SUGGEST RECOLLECTION (A)  Final   Report Status 03/14/2016 FINAL  Final  MRSA PCR Screening     Status: None   Collection Time: 03/14/16 12:23 AM  Result Value Ref Range Status   MRSA by PCR NEGATIVE NEGATIVE Final    Comment:        The GeneXpert MRSA Assay (FDA approved for NASAL specimens only), is one component of a comprehensive MRSA colonization surveillance program. It is not intended to diagnose MRSA infection nor to guide or monitor treatment for MRSA infections.      Labs: Basic Metabolic Panel:  Recent Labs Lab 03/13/16 1341  NA 141  K 4.8  CL 113*  CO2 21*  GLUCOSE 79  BUN 14  CREATININE 0.83  CALCIUM 9.7   Liver Function Tests:  Recent Labs Lab 03/13/16 1341  AST 125*  ALT 166*  ALKPHOS 100  BILITOT 0.5  PROT 6.9  ALBUMIN 3.8   No results for input(s): LIPASE, AMYLASE in the last 168 hours. No results for input(s): AMMONIA in the last 168 hours. CBC:  Recent Labs Lab 03/13/16 1341  WBC 5.6  NEUTROABS 2.5  HGB 12.8  HCT 41.9  MCV 86.2  PLT 165   Cardiac Enzymes: No results for input(s): CKTOTAL, CKMB, CKMBINDEX, TROPONINI in the last 168 hours. BNP: BNP (last 3 results) No results for input(s): BNP in the last 8760 hours.  ProBNP (last 3 results) No results for input(s): PROBNP in the last 8760 hours.  CBG: No results for input(s): GLUCAP in the last 168 hours.     SignedZannie Cove:  Leandra Vanderweele MD.  [facility] 03/14/2016, 5:05 PM

## 2016-03-16 ENCOUNTER — Encounter (HOSPITAL_COMMUNITY): Admission: RE | Payer: Self-pay | Source: Ambulatory Visit

## 2016-03-16 ENCOUNTER — Ambulatory Visit (HOSPITAL_COMMUNITY): Admission: RE | Admit: 2016-03-16 | Payer: Medicaid Other | Source: Ambulatory Visit | Admitting: Oral Surgery

## 2016-03-16 SURGERY — MULTIPLE EXTRACTION WITH ALVEOLOPLASTY
Anesthesia: General | Laterality: Bilateral

## 2016-04-02 ENCOUNTER — Other Ambulatory Visit (HOSPITAL_COMMUNITY): Payer: Self-pay | Admitting: Nurse Practitioner

## 2016-04-02 DIAGNOSIS — K7210 Chronic hepatic failure without coma: Secondary | ICD-10-CM

## 2016-04-17 ENCOUNTER — Encounter: Payer: Self-pay | Admitting: Adult Health

## 2016-04-17 ENCOUNTER — Ambulatory Visit (INDEPENDENT_AMBULATORY_CARE_PROVIDER_SITE_OTHER): Payer: Medicaid Other | Admitting: Adult Health

## 2016-04-17 VITALS — BP 115/63 | HR 110 | Ht 68.0 in | Wt 175.0 lb

## 2016-04-17 DIAGNOSIS — G43019 Migraine without aura, intractable, without status migrainosus: Secondary | ICD-10-CM | POA: Diagnosis not present

## 2016-04-17 DIAGNOSIS — Z8673 Personal history of transient ischemic attack (TIA), and cerebral infarction without residual deficits: Secondary | ICD-10-CM | POA: Diagnosis not present

## 2016-04-17 DIAGNOSIS — R569 Unspecified convulsions: Secondary | ICD-10-CM | POA: Diagnosis not present

## 2016-04-17 MED ORDER — GABAPENTIN 300 MG PO CAPS: 300.0000 mg | ORAL_CAPSULE | Freq: Three times a day (TID) | ORAL | 11 refills | Status: DC

## 2016-04-17 NOTE — Progress Notes (Signed)
I have read the note, and I agree with the clinical assessment and plan.  Trung Wenzl KEITH   

## 2016-04-17 NOTE — Patient Instructions (Signed)
Wean off topamax  Start gabapentin 300 mg three times a day If you continue to have seizures please let us know If your symptoms worsen or you develop new symptoms please let us know.
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
## 2016-04-24 ENCOUNTER — Ambulatory Visit (HOSPITAL_COMMUNITY)
Admission: RE | Admit: 2016-04-24 | Discharge: 2016-04-24 | Disposition: A | Discharge status: Home / Self Care | Payer: Medicaid Other | Source: Ambulatory Visit | Attending: Nurse Practitioner | Admitting: Nurse Practitioner

## 2016-04-24 DIAGNOSIS — K7210 Chronic hepatic failure without coma: Secondary | ICD-10-CM

## 2016-04-24 DIAGNOSIS — K740 Hepatic fibrosis: Secondary | ICD-10-CM | POA: Insufficient documentation

## 2016-04-24 DIAGNOSIS — B182 Chronic viral hepatitis C: Secondary | ICD-10-CM | POA: Insufficient documentation

## 2016-04-29 IMAGING — CR DG CHEST 1V PORT
1 series · 1 of 1 positions shown · non-contrast
Comparison: 10/11/2015.

CLINICAL DATA: Respiratory failure.

EXAM:
PORTABLE CHEST 1 VIEW

[AP]
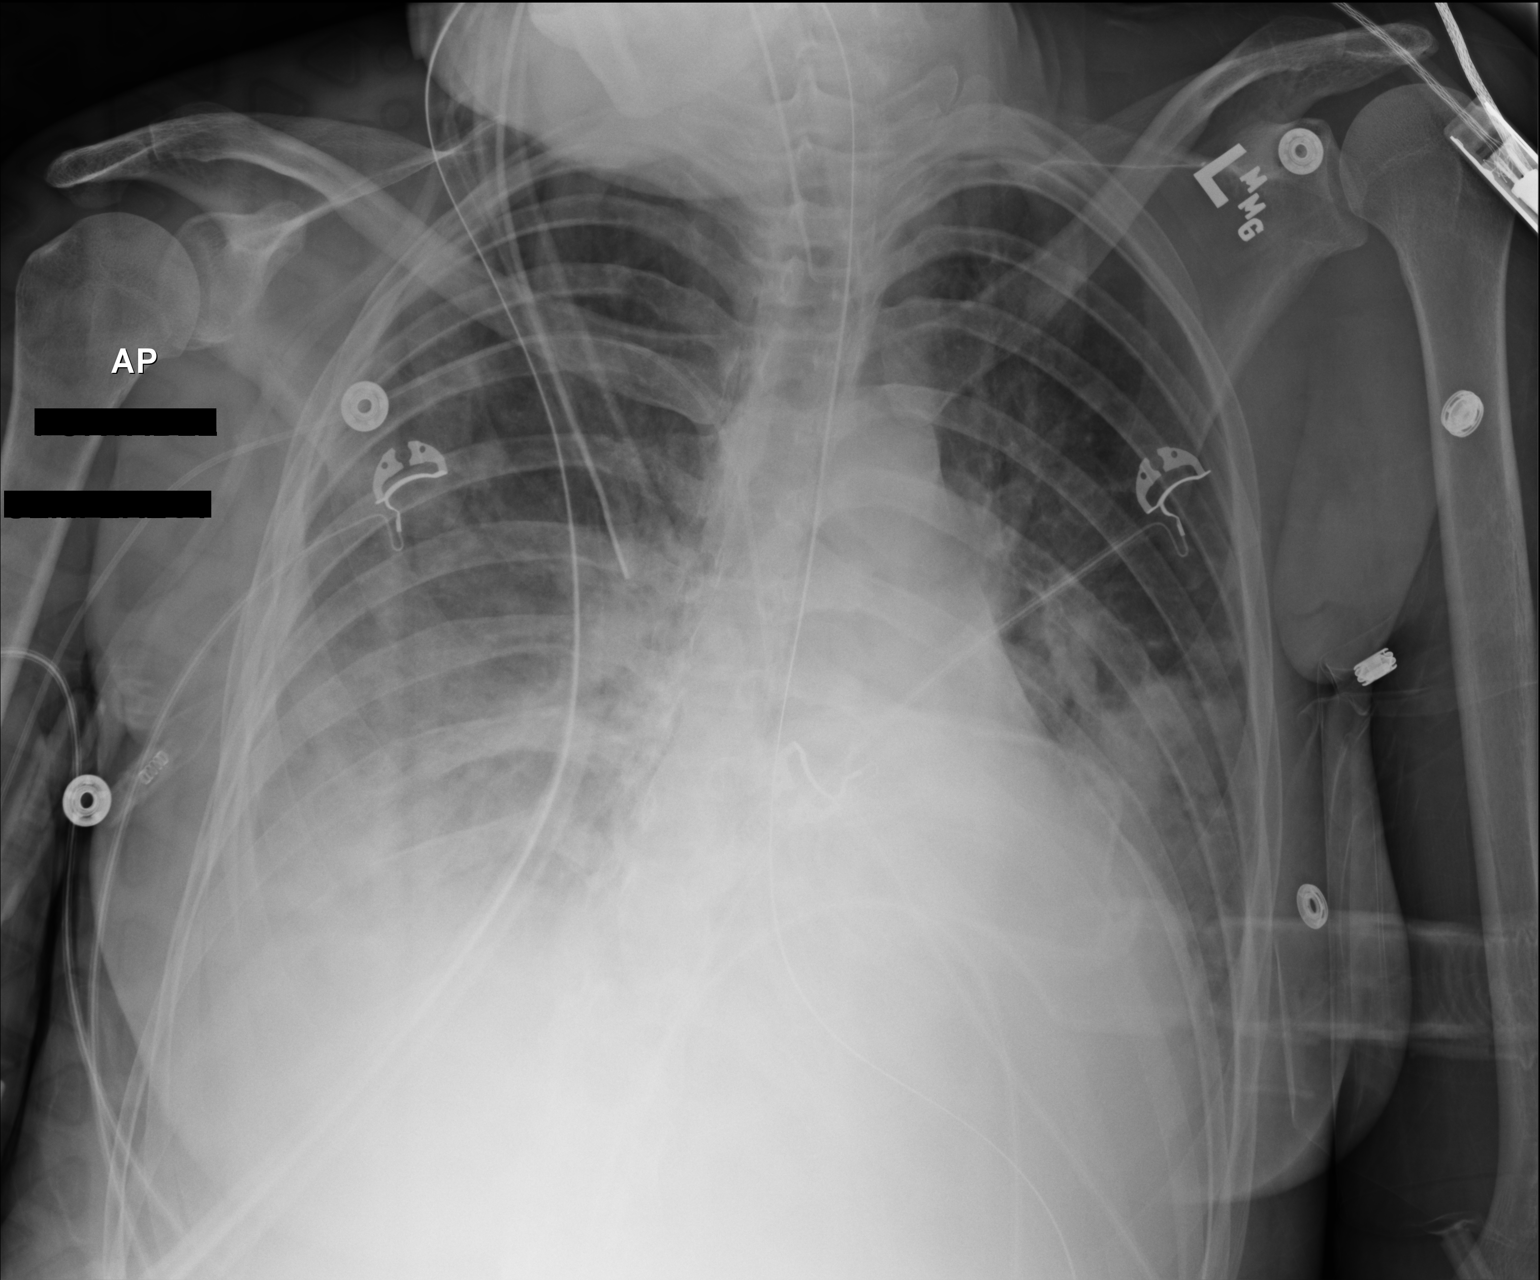

[1 of 1 positions shown; findings below may reference images not displayed]

FINDINGS: Interim removal of left IJ line. Endotracheal tube, NG tube, right
PICC line in stable position. Heart size normal. Diffuse bilateral
pulmonary infiltrates, progressive from prior exam. Small bilateral
pleural effusions. No pneumothorax.
IMPRESSION: 1. Removal of left IJ line. Remaining lines and tubes in stable
position.

2. Progressive bilateral pulmonary infiltrates/edema. Low lung
volumes with basilar atelectasis. Small bilateral pleural effusions.

## 2016-05-01 IMAGING — CR DG CHEST 1V PORT
1 series · 1 of 1 positions shown · non-contrast
Comparison: 10/13/2015.

CLINICAL DATA: Respiratory failure.

EXAM:
PORTABLE CHEST 1 VIEW

[AP]
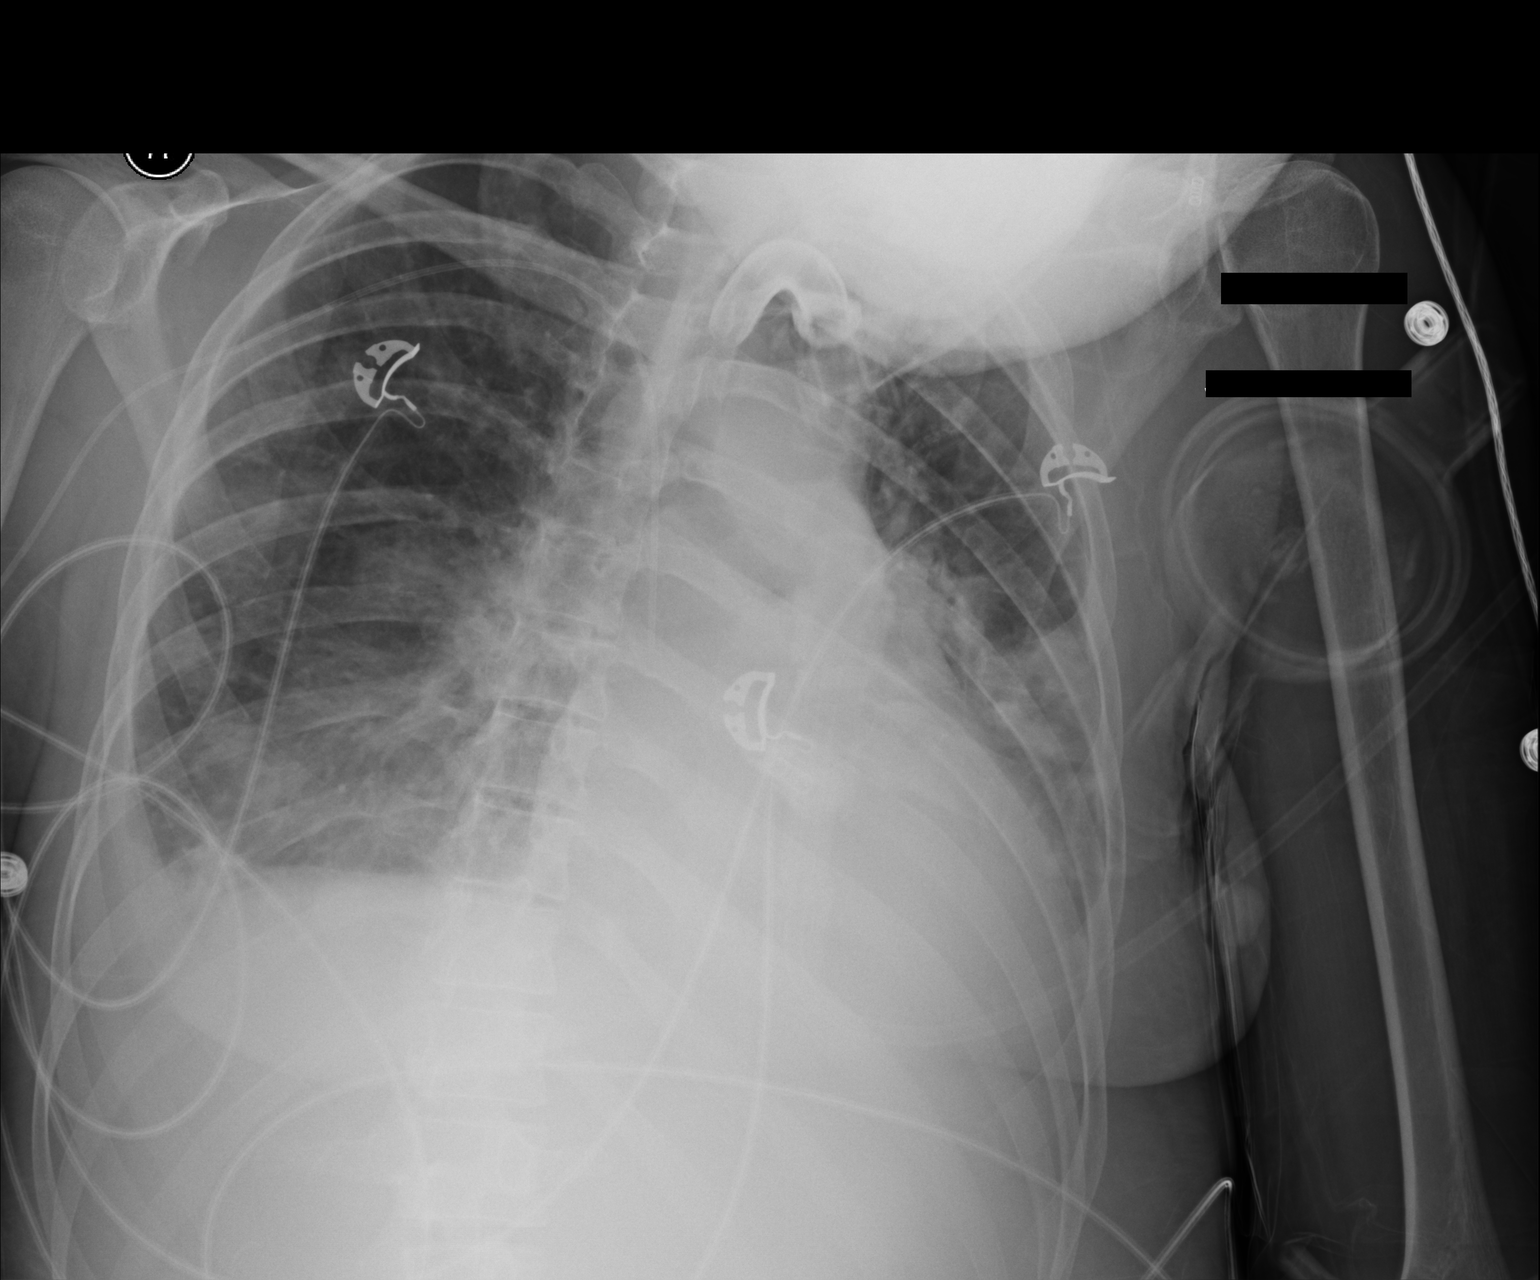

[1 of 1 positions shown; findings below may reference images not displayed]

FINDINGS: Tracheostomy tube and right PICC line in stable position.
Cardiomegaly. Diffuse bilateral pulmonary infiltrates again noted.
Bilateral pleural effusions again noted. No interim change. No
pneumothorax.
IMPRESSION: 1. Lines and tubes in stable position.

2. Persistent bilateral pulmonary infiltrates and basilar
atelectasis. Small persistent bilateral pleural effusions. No
interim change.

## 2016-05-02 IMAGING — CR DG ABD PORTABLE 1V
1 series · 1 of 1 positions shown · non-contrast
Comparison: 10/14/2015 and earlier.

CLINICAL DATA: Evaluate feeding tube position after placement at
bedside.

EXAM:
PORTABLE ABDOMEN - 1 VIEW

[AP]
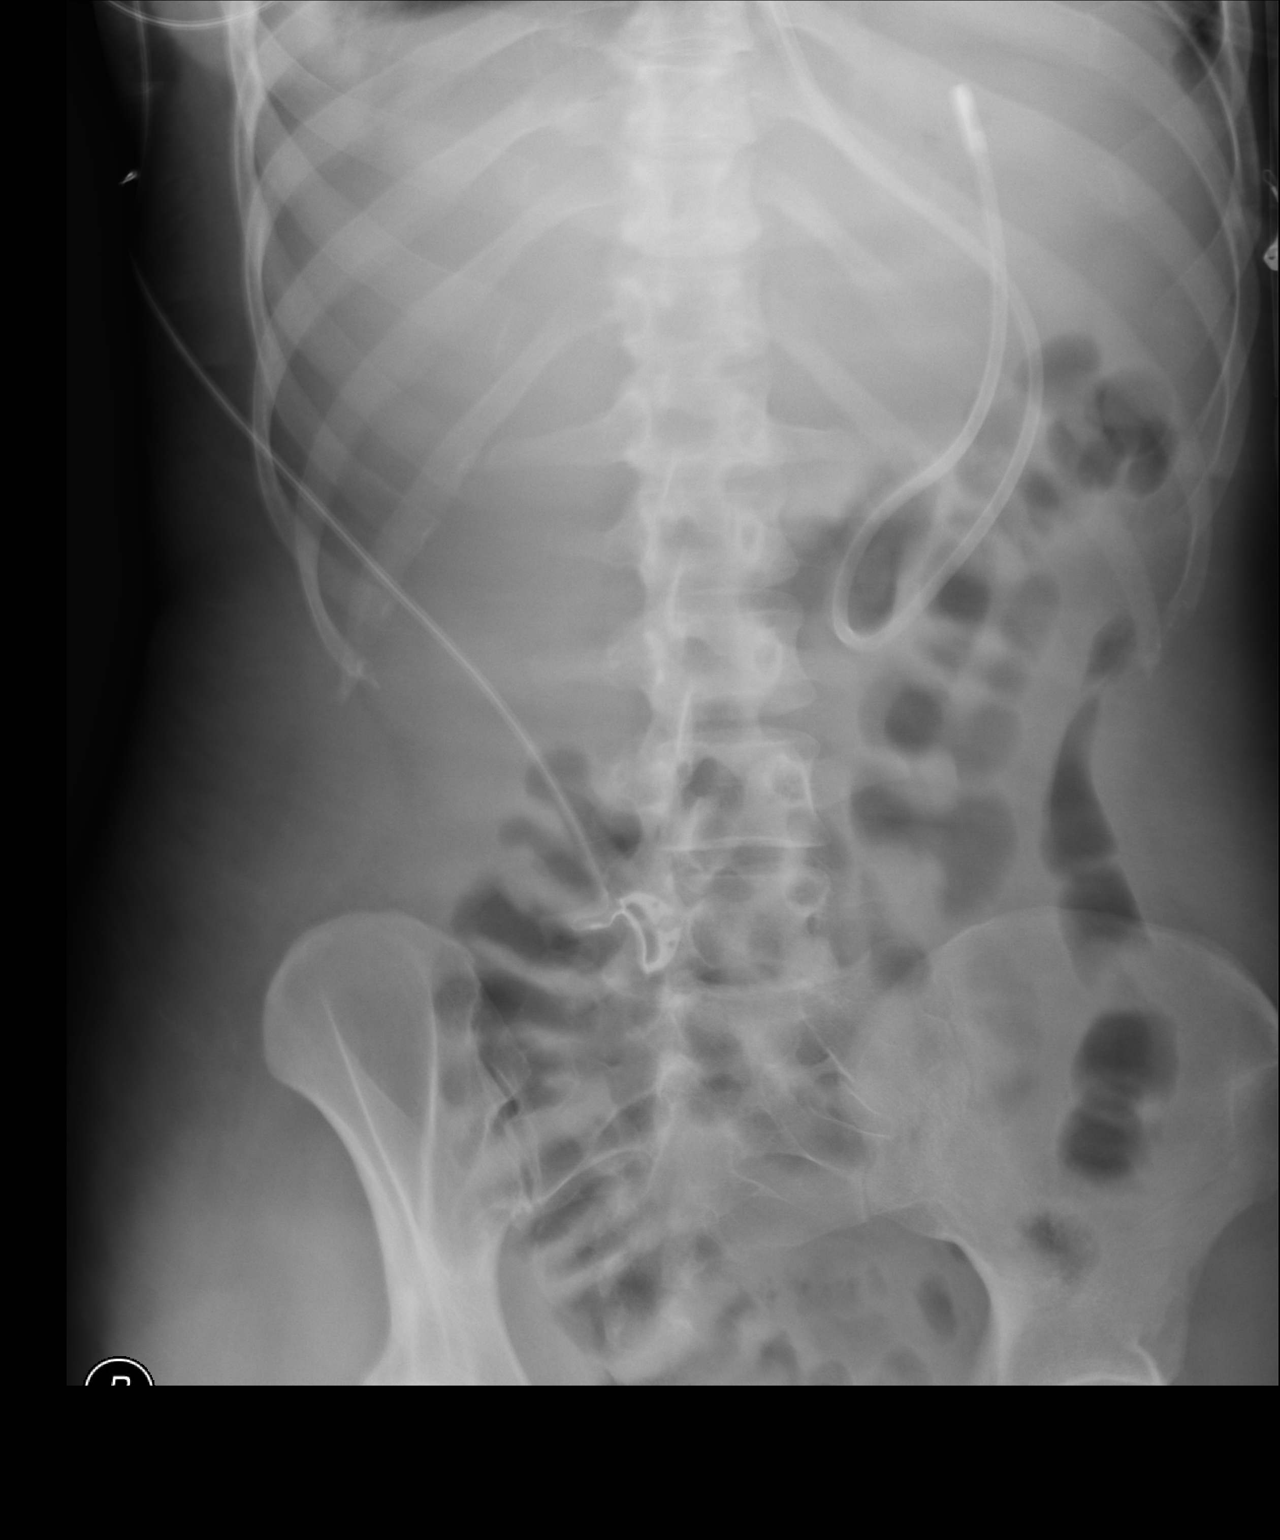

[1 of 1 positions shown; findings below may reference images not displayed]

FINDINGS: Feeding tube looped in the stomach with its tip in the fundus. Bowel
gas pattern unremarkable.
IMPRESSION: 1. Feeding tube looped in the stomach with its tip in the fundus.
2. No acute abdominal abnormality.

## 2016-05-02 IMAGING — CR DG CHEST 1V PORT
1 series · 1 of 1 positions shown · non-contrast
Comparison: October 14, 2015

CLINICAL DATA: Acute respiratory failure.

EXAM:
PORTABLE CHEST 1 VIEW

[AP]
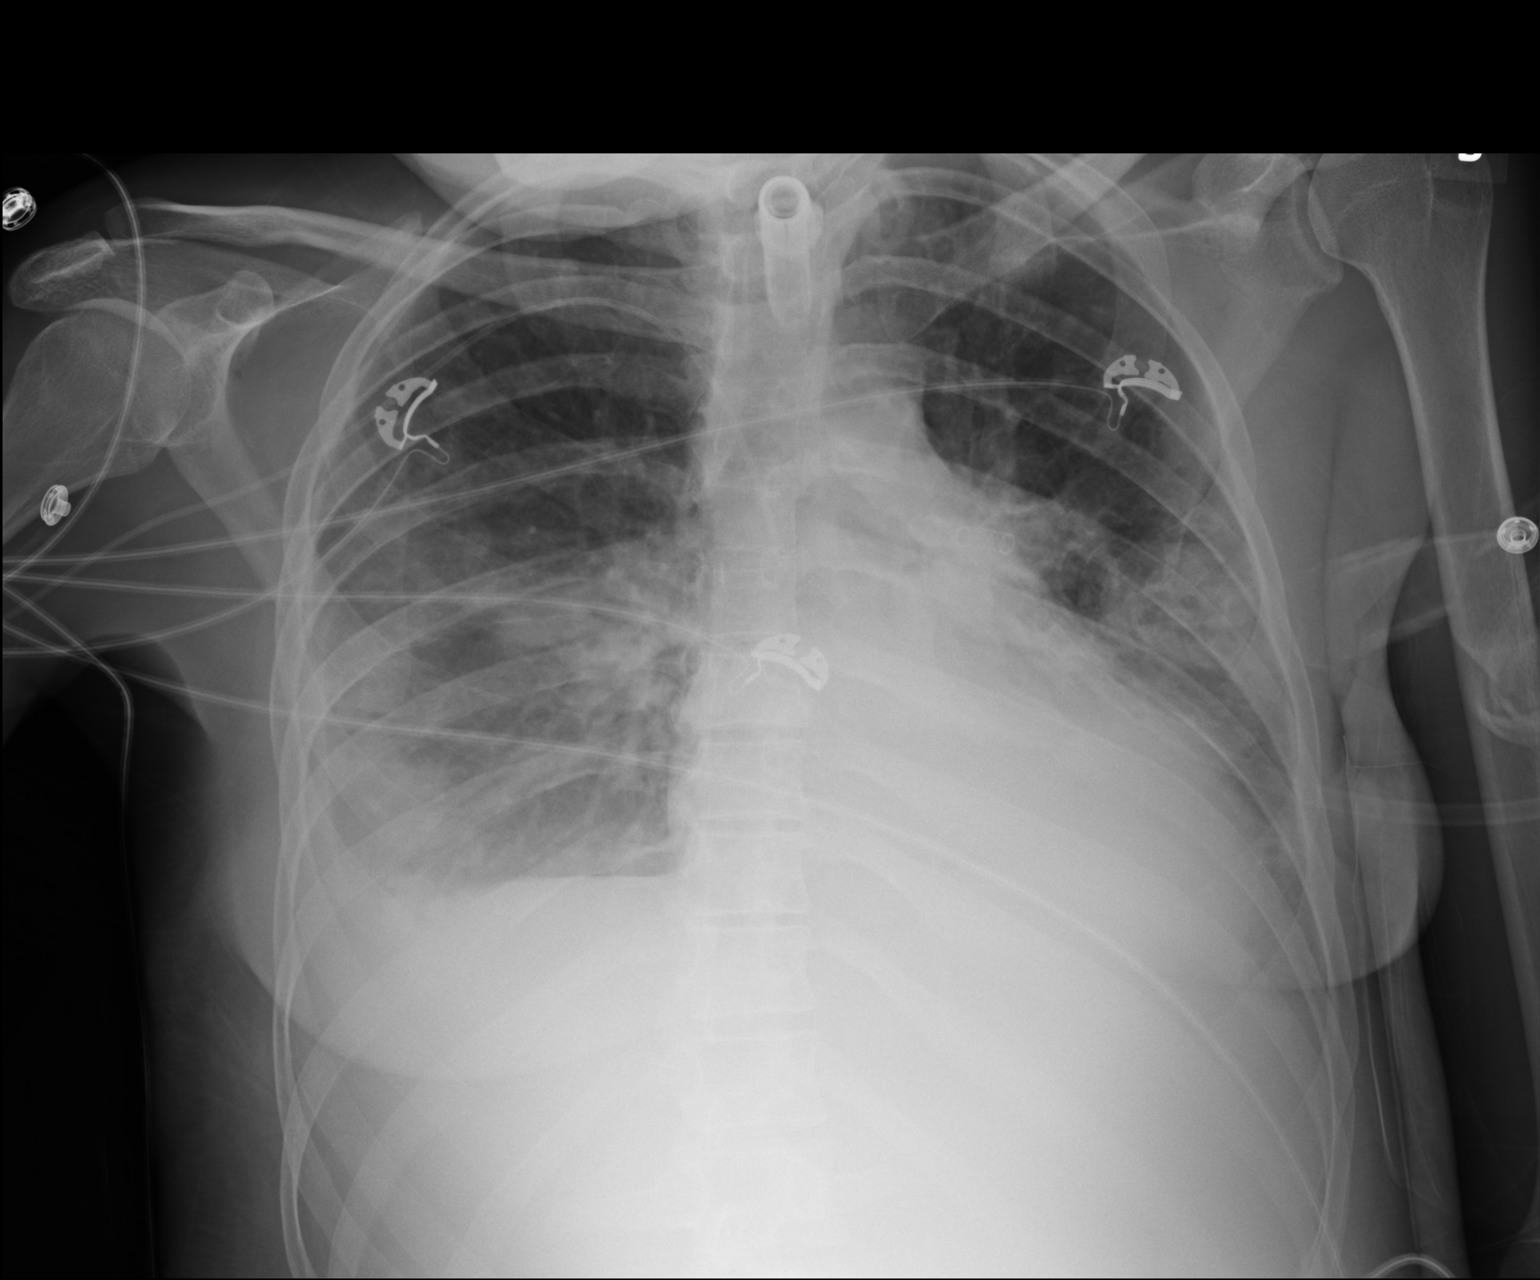

[1 of 1 positions shown; findings below may reference images not displayed]

FINDINGS: No pneumothorax. Stable right PICC line. Patchy opacities in both
lung bases with a small effusion on the right are again identified.
The right basilar opacity is mildly worsened while the left is
stable.
IMPRESSION: Mild worsening of right basilar opacity. Stable opacity on the left.
No other changes.

## 2016-05-24 ENCOUNTER — Telehealth: Payer: Self-pay | Admitting: Adult Health

## 2016-05-24 MED ORDER — GABAPENTIN 400 MG PO CAPS: 400.0000 mg | ORAL_CAPSULE | Freq: Three times a day (TID) | ORAL | 11 refills | Status: DC
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
## 2016-05-24 NOTE — Telephone Encounter (Signed)
I called the patient and spoke to her mother. She states that she has been discomfort due to ovarian cysts. She feels like the pain has caused her to have "mini seizures." She states that the patient will be staring off or will clenched down on her tongue. I will increase gabapentin to 400 mg 3 times a day. Patient will need to follow up in office in the next 1-2 weeks. We did discuss emergency room visit if the patient has a prolonged seizure.

## 2016-05-24 NOTE — Telephone Encounter (Signed)
Patient called at the recommendation of her therapist, while at therapist appointment today, patient had mini seizure, therapist observed that it was like she was off in space but couldn't hear her, wasn't answering. Patient states when she saw NP, Megan, "she advised that gabapentin (NEURONTIN) 300 MG capsule can be adjusted until we get it right". Please call to advise.

## 2016-05-25 NOTE — Telephone Encounter (Signed)
Made appt 05-29-16 at 1000 with mother. She will let pt know.

## 2016-05-29 ENCOUNTER — Encounter: Payer: Self-pay | Admitting: Adult Health

## 2016-05-29 ENCOUNTER — Ambulatory Visit (INDEPENDENT_AMBULATORY_CARE_PROVIDER_SITE_OTHER): Payer: Medicaid Other | Admitting: Adult Health

## 2016-05-29 VITALS — BP 126/65 | HR 101 | Wt 192.0 lb

## 2016-05-29 DIAGNOSIS — Z87898 Personal history of other specified conditions: Secondary | ICD-10-CM | POA: Diagnosis not present

## 2016-05-29 DIAGNOSIS — F1911 Other psychoactive substance abuse, in remission: Secondary | ICD-10-CM

## 2016-05-29 DIAGNOSIS — Z8673 Personal history of transient ischemic attack (TIA), and cerebral infarction without residual deficits: Secondary | ICD-10-CM

## 2016-05-29 DIAGNOSIS — R569 Unspecified convulsions: Secondary | ICD-10-CM

## 2016-05-29 NOTE — Progress Notes (Signed)
I have read the note, and I agree with the clinical assessment and plan.  WILLIS,CHARLES KEITH   

## 2016-05-29 NOTE — Patient Instructions (Signed)
Continue Gabapentin 400 mg three times a day Will consider video EEG monitor in the future If your symptoms worsen or you develop new symptoms please let us know.
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
## 2016-06-12 ENCOUNTER — Ambulatory Visit: Payer: Medicaid Other | Admitting: Adult Health

## 2016-07-19 ENCOUNTER — Ambulatory Visit: Payer: Medicaid Other | Admitting: Adult Health

## 2016-07-19 ENCOUNTER — Encounter: Payer: Self-pay | Admitting: Adult Health

## 2016-07-20 ENCOUNTER — Ambulatory Visit (HOSPITAL_COMMUNITY)
Admission: EM | Admit: 2016-07-20 | Discharge: 2016-07-20 | Disposition: A | Discharge status: Home / Self Care | Payer: Medicaid Other | Attending: Family Medicine | Admitting: Family Medicine

## 2016-07-20 ENCOUNTER — Encounter (HOSPITAL_COMMUNITY): Payer: Self-pay | Admitting: *Deleted

## 2016-07-20 DIAGNOSIS — R059 Cough, unspecified: Secondary | ICD-10-CM

## 2016-07-20 DIAGNOSIS — R05 Cough: Secondary | ICD-10-CM | POA: Diagnosis not present

## 2016-07-20 DIAGNOSIS — J9801 Acute bronchospasm: Secondary | ICD-10-CM | POA: Diagnosis not present

## 2016-07-20 DIAGNOSIS — R0982 Postnasal drip: Secondary | ICD-10-CM

## 2016-07-20 MED ORDER — ALBUTEROL SULFATE HFA 108 (90 BASE) MCG/ACT IN AERS: 2.0000 | INHALATION_SPRAY | RESPIRATORY_TRACT | 0 refills | Status: DC | PRN

## 2016-07-20 MED ORDER — ALBUTEROL SULFATE (2.5 MG/3ML) 0.083% IN NEBU
2.5000 mg | INHALATION_SOLUTION | Freq: Once | RESPIRATORY_TRACT | Status: AC
  Administered 2016-07-20: 2.5 mg via RESPIRATORY_TRACT

## 2016-07-20 MED ORDER — ALBUTEROL SULFATE (2.5 MG/3ML) 0.083% IN NEBU
INHALATION_SOLUTION | RESPIRATORY_TRACT | Status: AC
  Filled 2016-07-20: qty 3

## 2016-07-20 NOTE — Discharge Instructions (Signed)
Your cough is due to a combination of drainage in the back of your throat and bronchospasm which causes wheezing which causes cough. You may use  albuterol nebulizer at home to help with rock a spasm or your handheld inhaler. Recommend taking either Zyrtec or Allegra daily for drainage in the back of your throat. Drink plenty of cold liquids and stay well-hydrated.
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
## 2016-07-20 NOTE — ED Triage Notes (Signed)
Cough    X  sev  Weeks  Not    releived      By  otc   Lesion  Over  r             approx  2  Weeks   Ago

## 2016-08-20 ENCOUNTER — Other Ambulatory Visit: Payer: Self-pay | Admitting: Psychiatry

## 2016-08-20 ENCOUNTER — Other Ambulatory Visit: Payer: Self-pay | Admitting: Internal Medicine

## 2016-08-20 DIAGNOSIS — R109 Unspecified abdominal pain: Secondary | ICD-10-CM

## 2016-08-27 ENCOUNTER — Other Ambulatory Visit: Payer: Medicaid Other

## 2016-08-29 ENCOUNTER — Telehealth: Payer: Self-pay | Admitting: Neurology

## 2016-08-29 ENCOUNTER — Ambulatory Visit: Payer: Medicaid Other | Admitting: Neurology

## 2016-08-29 NOTE — Telephone Encounter (Signed)
This patient did not show for a revisit appointment today. She also did not show on 07/19/2016.

## 2016-08-30 ENCOUNTER — Ambulatory Visit
Admission: RE | Admit: 2016-08-30 | Discharge: 2016-08-30 | Disposition: A | Discharge status: Home / Self Care | Payer: Medicaid Other | Source: Ambulatory Visit | Attending: Internal Medicine | Admitting: Internal Medicine

## 2016-08-30 DIAGNOSIS — R109 Unspecified abdominal pain: Secondary | ICD-10-CM

## 2016-08-31 ENCOUNTER — Emergency Department (HOSPITAL_COMMUNITY)
Admission: EM | Admit: 2016-08-31 | Discharge: 2016-08-31 | Disposition: A | Discharge status: Home / Self Care | Payer: Medicaid Other | Attending: Emergency Medicine | Admitting: Emergency Medicine

## 2016-08-31 ENCOUNTER — Encounter (HOSPITAL_COMMUNITY): Payer: Self-pay | Admitting: *Deleted

## 2016-08-31 DIAGNOSIS — N189 Chronic kidney disease, unspecified: Secondary | ICD-10-CM | POA: Diagnosis not present

## 2016-08-31 DIAGNOSIS — F17210 Nicotine dependence, cigarettes, uncomplicated: Secondary | ICD-10-CM | POA: Diagnosis not present

## 2016-08-31 DIAGNOSIS — L03319 Cellulitis of trunk, unspecified: Secondary | ICD-10-CM | POA: Diagnosis not present

## 2016-08-31 DIAGNOSIS — Z79899 Other long term (current) drug therapy: Secondary | ICD-10-CM | POA: Insufficient documentation

## 2016-08-31 DIAGNOSIS — N611 Abscess of the breast and nipple: Secondary | ICD-10-CM | POA: Diagnosis present

## 2016-08-31 DIAGNOSIS — Z91040 Latex allergy status: Secondary | ICD-10-CM | POA: Insufficient documentation

## 2016-08-31 LAB — URINALYSIS, ROUTINE W REFLEX MICROSCOPIC
BILIRUBIN URINE: NEGATIVE
GLUCOSE, UA: NEGATIVE mg/dL
HGB URINE DIPSTICK: NEGATIVE
Ketones, ur: NEGATIVE mg/dL
NITRITE: NEGATIVE
Protein, ur: NEGATIVE mg/dL
SPECIFIC GRAVITY, URINE: 1.02 (ref 1.005–1.030)
pH: 6 (ref 5.0–8.0)

## 2016-08-31 LAB — COMPREHENSIVE METABOLIC PANEL
ALK PHOS: 87 U/L (ref 38–126)
ALT: 13 U/L — ABNORMAL LOW (ref 14–54)
ANION GAP: 7 (ref 5–15)
AST: 18 U/L (ref 15–41)
Albumin: 4.6 g/dL (ref 3.5–5.0)
BILIRUBIN TOTAL: 0.6 mg/dL (ref 0.3–1.2)
BUN: 15 mg/dL (ref 6–20)
CALCIUM: 9.6 mg/dL (ref 8.9–10.3)
CO2: 28 mmol/L (ref 22–32)
Chloride: 107 mmol/L (ref 101–111)
Creatinine, Ser: 1.02 mg/dL — ABNORMAL HIGH (ref 0.44–1.00)
GLUCOSE: 93 mg/dL (ref 65–99)
POTASSIUM: 4.2 mmol/L (ref 3.5–5.1)
Sodium: 142 mmol/L (ref 135–145)
TOTAL PROTEIN: 8.2 g/dL — AB (ref 6.5–8.1)

## 2016-08-31 LAB — CBC WITH DIFFERENTIAL/PLATELET
BASOS ABS: 0 10*3/uL (ref 0.0–0.1)
BASOS PCT: 0 %
Eosinophils Absolute: 0.1 10*3/uL (ref 0.0–0.7)
Eosinophils Relative: 1 %
HEMATOCRIT: 40.9 % (ref 36.0–46.0)
Hemoglobin: 13.3 g/dL (ref 12.0–15.0)
LYMPHS PCT: 33 %
Lymphs Abs: 3.2 10*3/uL (ref 0.7–4.0)
MCH: 28.6 pg (ref 26.0–34.0)
MCHC: 32.5 g/dL (ref 30.0–36.0)
MCV: 88 fL (ref 78.0–100.0)
MONO ABS: 0.7 10*3/uL (ref 0.1–1.0)
Monocytes Relative: 7 %
NEUTROS ABS: 5.8 10*3/uL (ref 1.7–7.7)
NEUTROS PCT: 59 %
Platelets: 188 10*3/uL (ref 150–400)
RBC: 4.65 MIL/uL (ref 3.87–5.11)
RDW: 13.6 % (ref 11.5–15.5)
WBC: 9.8 10*3/uL (ref 4.0–10.5)

## 2016-08-31 LAB — I-STAT CG4 LACTIC ACID, ED: Lactic Acid, Venous: 0.81 mmol/L (ref 0.5–1.9)

## 2016-08-31 MED ORDER — CLINDAMYCIN HCL 300 MG PO CAPS
300.0000 mg | ORAL_CAPSULE | Freq: Once | ORAL | Status: AC
  Administered 2016-08-31: 300 mg via ORAL
  Filled 2016-08-31: qty 1

## 2016-08-31 MED ORDER — CLINDAMYCIN HCL 150 MG PO CAPS: 300.0000 mg | ORAL_CAPSULE | Freq: Four times a day (QID) | ORAL | 0 refills | Status: DC

## 2016-08-31 NOTE — Discharge Instructions (Signed)
You should get a call from the breast center today to schedule your ultrasound. If you do not receive a call by 4:15, please call them to inquire about scheduling. Warm compresses to the area. Please take all of your antibiotics until finished!  Please return to ER for fevers, new or worsening symptoms, any additional concerns.

## 2016-08-31 NOTE — ED Triage Notes (Signed)
Pt presents with R breast abscess x 4 days with drainage.  Swelling and redness noted.

## 2016-08-31 NOTE — ED Notes (Signed)
R breast abscess now draining. PA made aware
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
## 2016-08-31 NOTE — ED Notes (Signed)
Pt states they are unable to urinate at this time.

## 2016-09-01 LAB — URINE CULTURE

## 2016-09-04 ENCOUNTER — Encounter: Payer: Self-pay | Admitting: Neurology

## 2016-09-05 ENCOUNTER — Other Ambulatory Visit: Payer: Medicaid Other

## 2016-09-05 ENCOUNTER — Other Ambulatory Visit: Payer: Self-pay | Admitting: Physician Assistant

## 2016-09-05 DIAGNOSIS — N611 Abscess of the breast and nipple: Secondary | ICD-10-CM

## 2016-09-06 ENCOUNTER — Inpatient Hospital Stay
Admission: RE | Admit: 2016-09-06 | Discharge: 2016-09-06 | Disposition: A | Discharge status: Home / Self Care | Payer: Medicaid Other | Source: Ambulatory Visit | Attending: Physician Assistant | Admitting: Physician Assistant

## 2016-09-11 ENCOUNTER — Telehealth: Payer: Self-pay | Admitting: Adult Health

## 2016-09-11 NOTE — Telephone Encounter (Signed)
I called the mother back. She relayed symptoms below plus sleep disruption. Reports that they started after patient started Abilify. Patient has an appt at [facility] tomorrow to discuss this medication.  I advised the mother to keep appt tomorrow and discuss if Abilify is the cause of her symptoms.  I asked about any signs of infection. Mother states that the patient just finished a round of antibiotics for a breast infection. I encouraged the mother to f/u with PCP for this.  Just incase infection is not gone. Patient is supposed to have some imaging done of the breast but it has not been done yet.  I advised her that I would let you know what is going on and you or I would call back if we have any further information or questions.

## 2016-09-11 NOTE — Telephone Encounter (Signed)
Patients mother called office in reference to patient not acting right since last Thursday.  Patient hasn't been talking good, trouble walking due to legs hurting and trouble standing.  Per mother patient has not been able to keep her eyes open when talking.  Patient has recently starting taking Abilify 15mg .  Please call

## 2016-09-11 NOTE — Telephone Encounter (Signed)
This could be due to the medication. Please advise that if her symptoms worsen she should go to the ED.

## 2016-09-18 ENCOUNTER — Telehealth: Payer: Self-pay | Admitting: *Deleted

## 2016-09-18 ENCOUNTER — Encounter: Payer: Self-pay | Admitting: Neurology

## 2016-09-18 ENCOUNTER — Ambulatory Visit (INDEPENDENT_AMBULATORY_CARE_PROVIDER_SITE_OTHER): Payer: Medicaid Other | Admitting: Neurology

## 2016-09-18 VITALS — BP 124/73 | HR 95 | Ht 68.0 in | Wt 215.5 lb

## 2016-09-18 DIAGNOSIS — R569 Unspecified convulsions: Secondary | ICD-10-CM | POA: Diagnosis not present

## 2016-09-18 MED ORDER — MIRTAZAPINE 30 MG PO TABS: 30.0000 mg | ORAL_TABLET | Freq: Every day | ORAL | 3 refills | Status: DC
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
## 2016-09-18 NOTE — Telephone Encounter (Signed)
Patient needs to come in for a revisit before we can make referral.

## 2016-09-18 NOTE — Patient Instructions (Signed)
   We will go up on the mirtazepine to 30 mg at night.

## 2016-09-18 NOTE — Telephone Encounter (Signed)
This patient is requesting a sleep study for sleep walking.  Please make sure it is in workqueue correctly. She saw Dr. Anne HahnWillis last month.  Thank you, Bonita QuinLinda

## 2016-09-18 NOTE — Telephone Encounter (Signed)
Called and spoke with pt. Offered today at 430pm, check in 4pm. Pt agreeable to this. She requested this Friday if possible. Advised CW,MD has no openings Friday.

## 2016-09-18 NOTE — Progress Notes (Signed)
Gave completed/signed parking placard form to pt for temporary 6 months.

## 2016-09-18 NOTE — Telephone Encounter (Signed)
Aundra MilletMegan- Looks like the patient actually saw you law on 05/29/16.  Looks like Lafonda MossesDiana T spoke with patient about sleep disturbance in March. She has no showed two f/u with Dr Anne HahnWillis since your visit, FYI.

## 2016-09-25 ENCOUNTER — Encounter: Payer: Self-pay | Admitting: *Deleted

## 2016-09-25 NOTE — Progress Notes (Signed)
Faxed completed/signed paperwork for wheelchair recert to [facility] by CW,MD. Fax: [phone]. Received confirmation.

## 2016-10-08 ENCOUNTER — Telehealth: Payer: Self-pay | Admitting: Neurology

## 2016-10-08 MED ORDER — MIRTAZAPINE 45 MG PO TABS: 45.0000 mg | ORAL_TABLET | Freq: Every day | ORAL | 4 refills | Status: DC

## 2016-10-08 NOTE — Telephone Encounter (Signed)
Patient states she got the previous message and will follow instructions. A returned call is not needed.

## 2016-10-08 NOTE — Telephone Encounter (Signed)
Pt says she increased mirtazapine (REMERON) 30 MG tablet to 2/night and it is working well. She would like RX sent to [facility]

## 2016-10-08 NOTE — Addendum Note (Signed)
Addended by: York Spaniel on: 10/08/2016 01:03 PM   Modules accepted: Orders

## 2016-10-08 NOTE — Telephone Encounter (Signed)
I called patient. Taking 60 mg of Remeron at night exceeds the upper dosing limit for this medication. I will call in a 45 mg tablet taking one at night.

## 2016-10-18 ENCOUNTER — Emergency Department (HOSPITAL_COMMUNITY)
Admission: EM | Admit: 2016-10-18 | Discharge: 2016-10-18 | Disposition: A | Discharge status: Home / Self Care | Payer: Medicaid Other | Attending: Emergency Medicine | Admitting: Emergency Medicine

## 2016-10-18 ENCOUNTER — Encounter (HOSPITAL_COMMUNITY): Payer: Self-pay

## 2016-10-18 DIAGNOSIS — N189 Chronic kidney disease, unspecified: Secondary | ICD-10-CM | POA: Diagnosis not present

## 2016-10-18 DIAGNOSIS — Z91040 Latex allergy status: Secondary | ICD-10-CM | POA: Diagnosis not present

## 2016-10-18 DIAGNOSIS — F17210 Nicotine dependence, cigarettes, uncomplicated: Secondary | ICD-10-CM | POA: Insufficient documentation

## 2016-10-18 DIAGNOSIS — L299 Pruritus, unspecified: Secondary | ICD-10-CM | POA: Insufficient documentation

## 2016-10-18 DIAGNOSIS — T781XXA Other adverse food reactions, not elsewhere classified, initial encounter: Secondary | ICD-10-CM | POA: Diagnosis present

## 2016-10-18 DIAGNOSIS — Z79899 Other long term (current) drug therapy: Secondary | ICD-10-CM | POA: Insufficient documentation

## 2016-10-18 MED ORDER — EPINEPHRINE 0.3 MG/0.3ML IJ SOAJ: 0.3000 mg | Freq: Once | INTRAMUSCULAR | 0 refills | Status: AC

## 2016-10-18 MED ORDER — LORATADINE 10 MG PO TABS
10.0000 mg | ORAL_TABLET | Freq: Once | ORAL | Status: AC
  Administered 2016-10-18: 10 mg via ORAL
  Filled 2016-10-18: qty 1

## 2016-10-18 MED ORDER — LORATADINE 10 MG PO TABS: 10.0000 mg | ORAL_TABLET | Freq: Every day | ORAL | 0 refills | Status: DC

## 2016-10-18 MED ORDER — DIPHENHYDRAMINE HCL 50 MG/ML IJ SOLN
25.0000 mg | Freq: Once | INTRAMUSCULAR | Status: AC
  Administered 2016-10-18: 25 mg via INTRAVENOUS
  Filled 2016-10-18: qty 1

## 2016-10-18 MED ORDER — PREDNISONE 50 MG PO TABS: ORAL_TABLET | ORAL | 0 refills | Status: DC

## 2016-10-18 MED ORDER — FAMOTIDINE 20 MG PO TABS: 20.0000 mg | ORAL_TABLET | Freq: Two times a day (BID) | ORAL | 0 refills | Status: DC

## 2016-10-18 MED ORDER — METHYLPREDNISOLONE SODIUM SUCC 125 MG IJ SOLR
125.0000 mg | Freq: Once | INTRAMUSCULAR | Status: AC
  Administered 2016-10-18: 125 mg via INTRAVENOUS
  Filled 2016-10-18: qty 2

## 2016-10-18 NOTE — ED Triage Notes (Signed)
Pt states she has allergy to gluten and had a bite of bread 2 days ago. She has swelling and irritation to the left eye. Denies rash or swelling otherwise. Pt denies shortness of breath, resp e/u.

## 2016-10-18 NOTE — Discharge Instructions (Signed)
Your may take Benadryl in addition to the medications we give you. Return for worsening symptoms.
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
## 2016-10-19 ENCOUNTER — Other Ambulatory Visit: Payer: Self-pay | Admitting: Internal Medicine

## 2016-10-19 DIAGNOSIS — R1030 Lower abdominal pain, unspecified: Secondary | ICD-10-CM

## 2016-10-31 ENCOUNTER — Other Ambulatory Visit: Payer: Self-pay | Admitting: Nurse Practitioner

## 2016-10-31 DIAGNOSIS — K7469 Other cirrhosis of liver: Secondary | ICD-10-CM

## 2016-12-17 ENCOUNTER — Encounter: Payer: Self-pay | Admitting: Neurology

## 2016-12-17 ENCOUNTER — Ambulatory Visit (INDEPENDENT_AMBULATORY_CARE_PROVIDER_SITE_OTHER): Payer: Medicaid Other | Admitting: Neurology

## 2016-12-17 VITALS — BP 130/78 | HR 117 | Ht 68.0 in | Wt 228.5 lb

## 2016-12-17 DIAGNOSIS — G4486 Cervicogenic headache: Secondary | ICD-10-CM

## 2016-12-17 DIAGNOSIS — R51 Headache: Secondary | ICD-10-CM

## 2016-12-17 DIAGNOSIS — R569 Unspecified convulsions: Secondary | ICD-10-CM | POA: Diagnosis not present
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
## 2017-01-07 ENCOUNTER — Other Ambulatory Visit: Payer: Medicaid Other

## 2017-01-16 ENCOUNTER — Ambulatory Visit: Payer: Medicaid Other | Attending: Neurology | Admitting: Rehabilitative and Restorative Service Providers"

## 2017-01-16 DIAGNOSIS — M542 Cervicalgia: Secondary | ICD-10-CM | POA: Insufficient documentation

## 2017-01-16 DIAGNOSIS — R293 Abnormal posture: Secondary | ICD-10-CM | POA: Diagnosis present

## 2017-01-16 NOTE — Patient Instructions (Signed)
Shoulder Circle (Loosen Shoulders)    Rotate shoulders up, back and down. Repeat _10__ times per session.Do __3-4_ sessions per day.  Copyright  VHI. All rights reserved.   On Elbows (Prone)    Rise up on elbows as high as possible, keeping hips on floor. Hold __30__ seconds. Repeat __5__ times per set. Do __1__ sets per session. Do __2 sessions per day.  http://orth.exer.us/93   Copyright  VHI. All rights reserved.   Flexion (Assistive)    Clasp hands together and raise arms above head, keeping elbows as straight as possible. Can be done sitting or lying. Repeat _10___ times. Do __2__ sessions per day.  Copyright  VHI. All rights reserved.
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
## 2017-02-08 ENCOUNTER — Ambulatory Visit: Payer: Medicaid Other | Admitting: Rehabilitative and Restorative Service Providers"

## 2017-02-15 ENCOUNTER — Ambulatory Visit: Payer: Medicaid Other | Attending: Neurology | Admitting: Rehabilitative and Restorative Service Providers"

## 2017-02-15 DIAGNOSIS — M542 Cervicalgia: Secondary | ICD-10-CM | POA: Diagnosis not present

## 2017-02-15 DIAGNOSIS — R293 Abnormal posture: Secondary | ICD-10-CM | POA: Insufficient documentation

## 2017-02-15 NOTE — Patient Instructions (Signed)
Thoracic Self-Mobilization (Supine)    (No towel)  Lie on your back and bring arms out to the side.  Try to turn palms up. Hold _2 minutes. Relax. Repeat __1-2__ times per day as needed for postural stretching.  http://orth.exer.us/1001   Copyright  VHI. All rights reserved.  Extensors, Supine    Lie supine, head on small, rolled towel or one pillow.  Gently tuck chin and bring toward chest. Hold __5_ seconds. Repeat __10_ times per session. Do _2__ sessions per day.  Copyright  VHI. All rights reserved.  Shoulder Shrug    Bring shoulders up toward ears. Hold __3__ seconds. Relax. Repeat _5-10___ times. Do __2__ sessions per day.  http://gt2.exer.us/13   Copyright  VHI. All rights reserved.   Flexion (Assistive)    Clasp hands together and raise arms above head, keeping elbows as straight as possible. Can be done sitting or lying. Repeat _10___ times. Do __2__ sessions per day.  Copyright  VHI. All rights reserved.
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
## 2017-02-22 ENCOUNTER — Ambulatory Visit: Payer: Medicaid Other | Admitting: Rehabilitative and Restorative Service Providers"

## 2017-02-26 ENCOUNTER — Telehealth: Payer: Self-pay | Admitting: Neurology

## 2017-02-26 NOTE — Telephone Encounter (Signed)
Called and LVM for mother. Relayed information below. Gave number to neuro[facility]hab if they want to f/u with them. Also gave [facility] phone number if they have further questions or concerns.

## 2017-02-26 NOTE — Telephone Encounter (Signed)
I talk with the mother. The patient did not see Trula Ore from physical therapy on August 17, they were at rehabilitation, but Trula Ore was not there.  It appears from the note that the patient was to be discharged from physical therapy on August 17, the reason is not clear, possibly for lack of progression.  I have indicated the mother should call are seen in next week when she is back from vacation and discussed the issue with her, if it is felt that physical therapy is still of some benefit, I can reorder this therapy.

## 2017-02-26 NOTE — Telephone Encounter (Signed)
Called and spoke with Shanda Bumps at neurorehab. Trula Ore out of office until 8/27 who has been working with pt.  She states patient only authorized via insurance for appt until 02/27/17. She missed her appt on 02/22/17 and scheduled for 03/08/17. This appt had to be cx since she would not be authorized via insurance.  She advised Trula Ore was going to discharge her on 02/22/17 anyway due to this reason. She can call Trula Ore to go over instructions/exercises that she would have with her on 8/17 when she gets back in the office.

## 2017-02-26 NOTE — Telephone Encounter (Signed)
Pt called to inform that Dr Anne Hahn had referred her to rehab and she was informed that her session was being terminated.  Pt was not told why, pt is asking for a call back with the reason as to why her session was terminated.  Please call

## 2017-03-08 ENCOUNTER — Ambulatory Visit: Payer: Medicaid Other | Admitting: Rehabilitative and Restorative Service Providers"

## 2017-03-13 ENCOUNTER — Other Ambulatory Visit: Payer: Self-pay

## 2017-03-14 ENCOUNTER — Other Ambulatory Visit: Payer: Self-pay | Admitting: Internal Medicine

## 2017-03-14 DIAGNOSIS — R102 Pelvic and perineal pain: Secondary | ICD-10-CM

## 2017-03-18 ENCOUNTER — Ambulatory Visit
Admission: RE | Admit: 2017-03-18 | Discharge: 2017-03-18 | Disposition: A | Discharge status: Home / Self Care | Payer: Medicaid Other | Source: Ambulatory Visit | Attending: Internal Medicine | Admitting: Internal Medicine

## 2017-03-18 ENCOUNTER — Other Ambulatory Visit: Payer: Self-pay | Admitting: Internal Medicine

## 2017-03-18 DIAGNOSIS — M545 Low back pain: Secondary | ICD-10-CM

## 2017-03-19 ENCOUNTER — Ambulatory Visit
Admission: RE | Admit: 2017-03-19 | Discharge: 2017-03-19 | Disposition: A | Discharge status: Home / Self Care | Payer: Medicaid Other | Source: Ambulatory Visit | Attending: Internal Medicine | Admitting: Internal Medicine

## 2017-03-19 DIAGNOSIS — R102 Pelvic and perineal pain: Secondary | ICD-10-CM

## 2017-03-25 IMAGING — US US ABDOMEN COMPLETE W/ ELASTOGRAPHY
1 series · 13 of 25 positions shown · non-contrast
Comparison: None.

CLINICAL DATA: Patient with history of hepatitis-C.



[Series 1: us abdomen complete w/ elastography · 0.23mm/px · 13 of 74 slices shown]
[im 1/74]
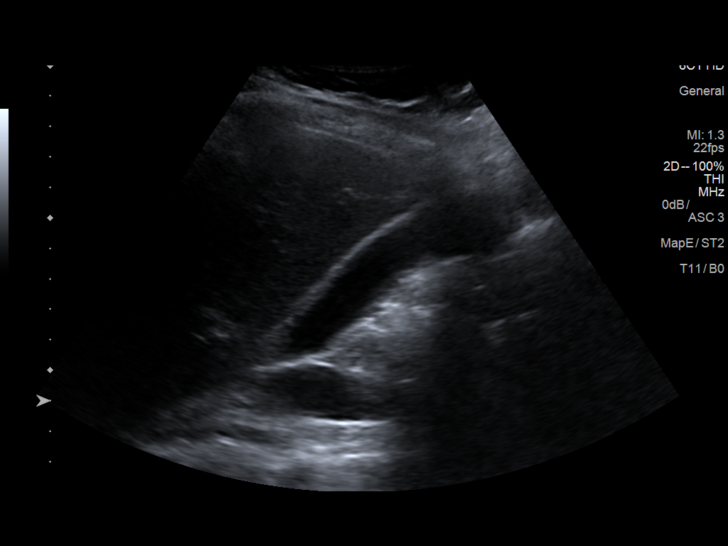
[im 7/74]
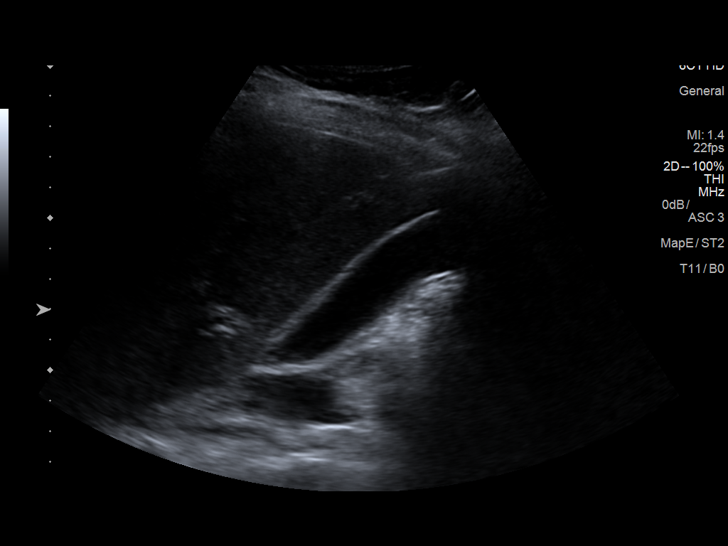
[im 13/74]
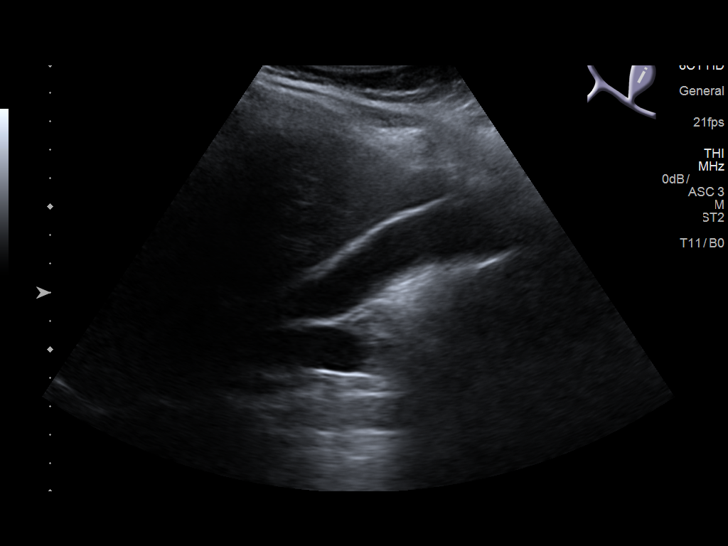
[im 19/74]
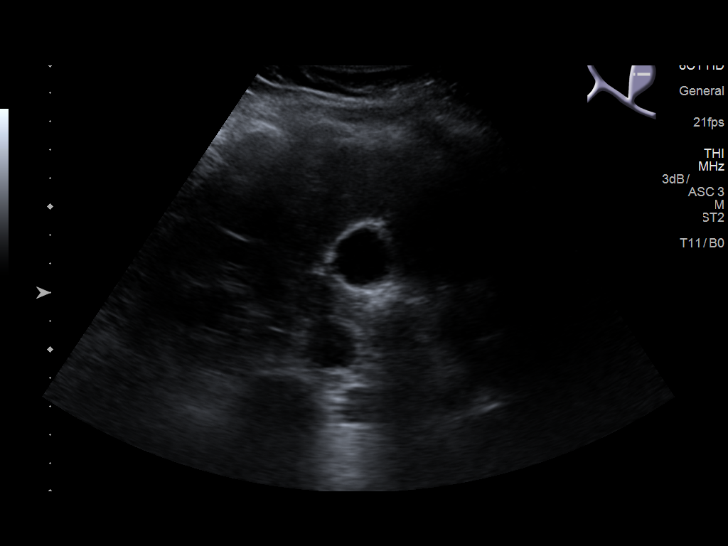
[im 25/74]
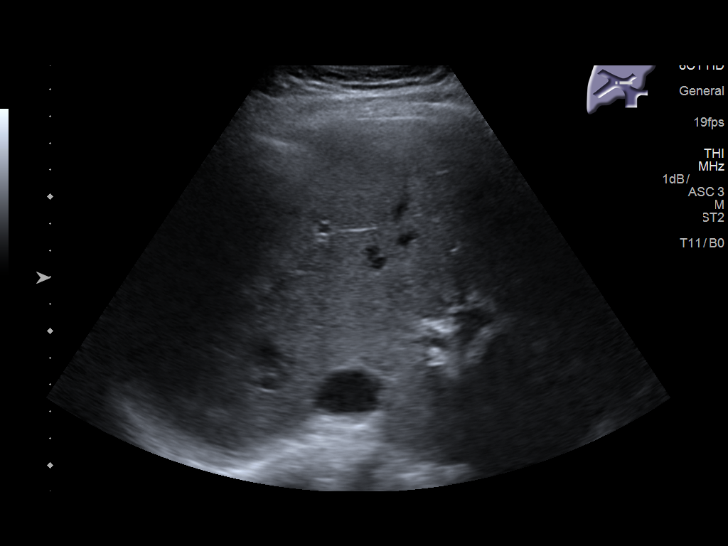
[im 31/74]
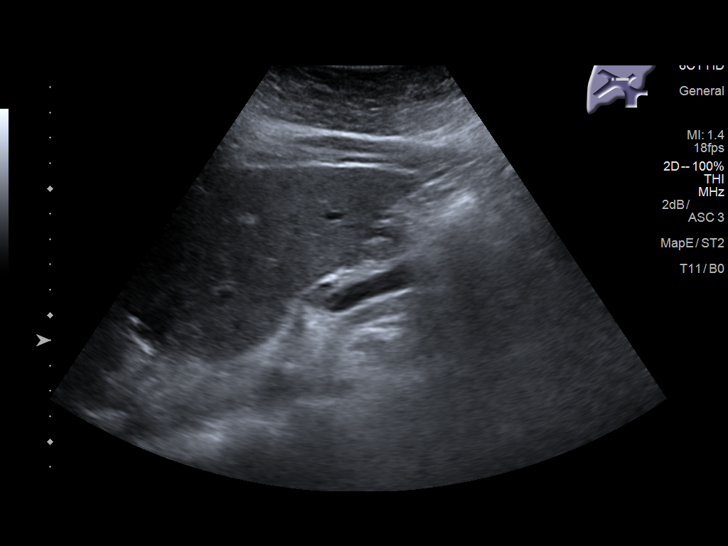
[im 37/74]
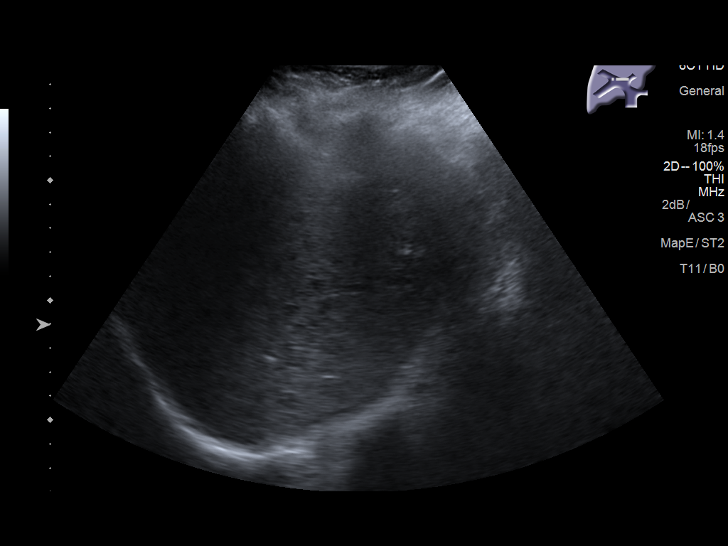
[im 43/74]
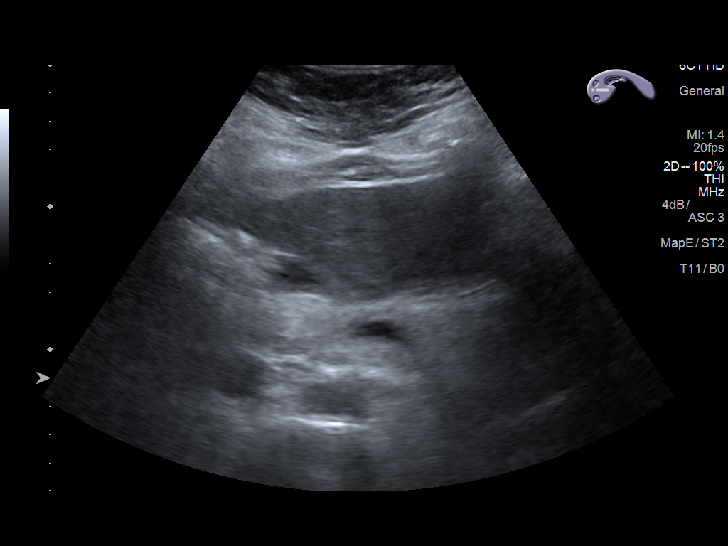
[im 49/74]
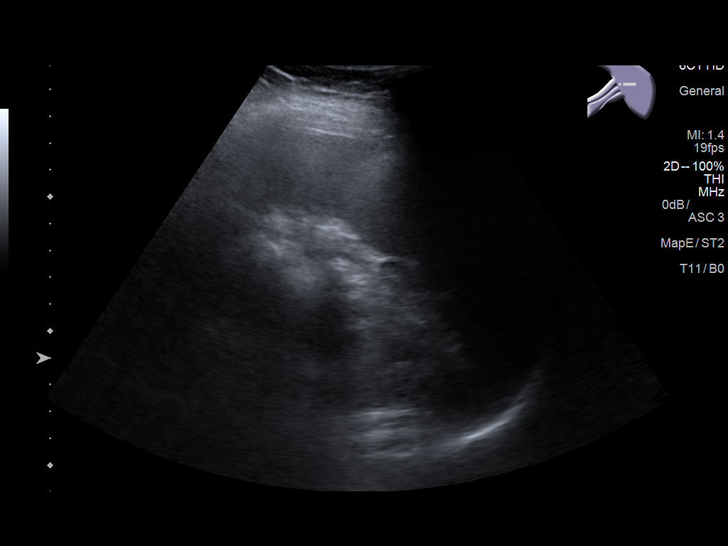
[im 55/74]
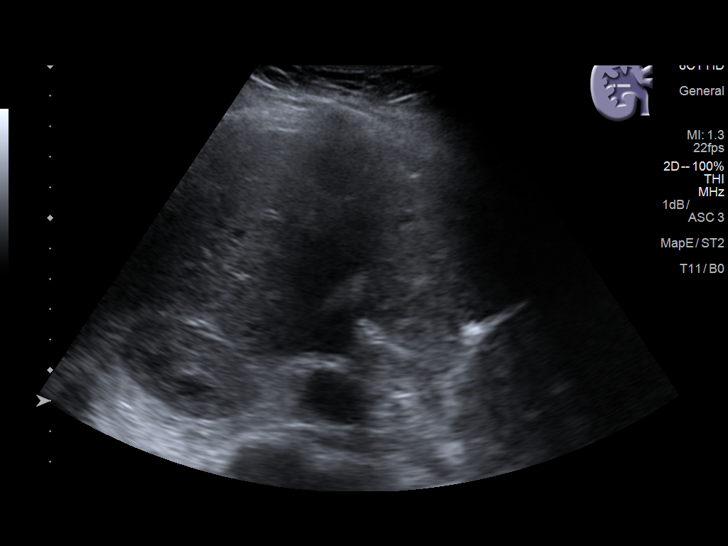
[im 61/74]
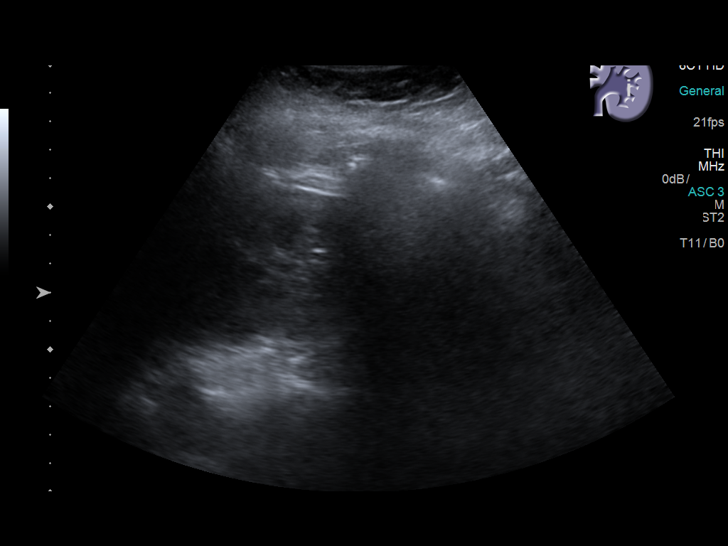
[im 67/74]
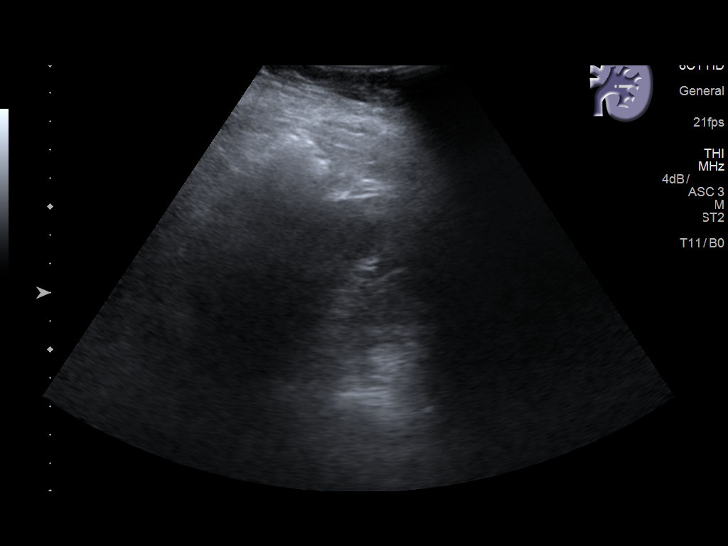
[im 74/74]
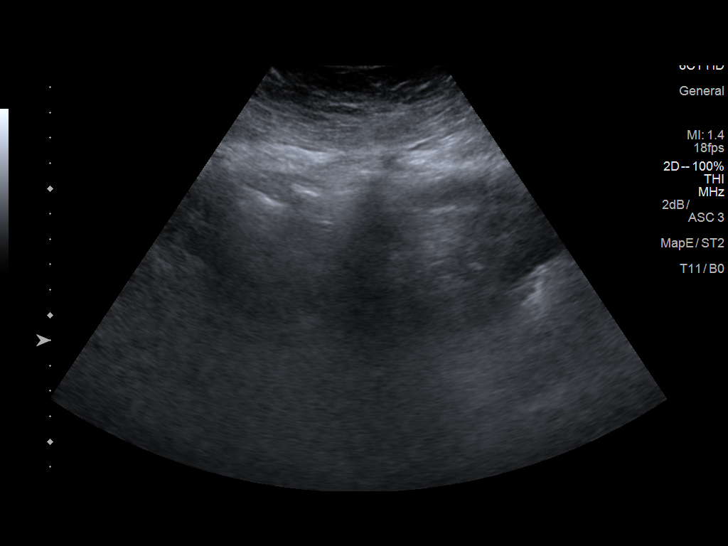

[13 of 25 positions shown; findings below may reference images not displayed]

FINDINGS: ULTRASOUND ABDOMEN

Gallbladder: No gallstones or wall thickening visualized. No
sonographic Murphy sign noted by sonographer.

Common bile duct: Diameter: 4.5 mm

Liver: No focal lesion identified. Within normal limits in
parenchymal echogenicity.

IVC: No abnormality visualized.

Pancreas: Visualized portion unremarkable.

Spleen: Size and appearance within normal limits.

Right Kidney: Length: 10.6 cm. Echogenicity within normal limits. No
mass or hydronephrosis visualized.

Left Kidney: Length: 10.7 cm. Echogenicity within normal limits. No
mass or hydronephrosis visualized.

Abdominal aorta: No aneurysm visualized.

Other findings: None.

ULTRASOUND HEPATIC ELASTOGRAPHY

Device: Siemens Helix VTQ

Patient position: Supine

Transducer 6C1

Number of measurements: 10

Hepatic segment:  8

Median velocity:   2.64  m/sec

IQR:

IQR/Median velocity ratio:

Corresponding Metavir fibrosis score:  F3 + F4

Risk of fibrosis: High

Limitations of exam: None

Pertinent findings noted on other imaging exams:  None

Please note that abnormal shear wave velocities may also be
identified in clinical settings other than with hepatic fibrosis,
such as: acute hepatitis, elevated right heart and central venous
pressures including use of beta blockers, Danirian disease
(Leclercq), infiltrative processes such as
mastocytosis/amyloidosis/infiltrative tumor, extrahepatic
cholestasis, in the post-prandial state, and liver transplantation.
Correlation with patient history, laboratory data, and clinical
condition recommended.
IMPRESSION: ULTRASOUND ABDOMEN:
Unremarkable abdominal ultrasound.

ULTRASOUND HEPATIC ELASTOGRAPHY:

Median hepatic shear wave velocity is calculated at 2.64 m/sec.

Corresponding Metavir fibrosis score is F3 + F4.

Risk of fibrosis is High.

Follow-up: Follow-up advised.

## 2017-03-27 ENCOUNTER — Encounter: Payer: Self-pay | Admitting: Neurology

## 2017-03-27 ENCOUNTER — Ambulatory Visit (INDEPENDENT_AMBULATORY_CARE_PROVIDER_SITE_OTHER): Payer: Medicaid Other | Admitting: Neurology

## 2017-03-27 VITALS — BP 124/73 | HR 94 | Ht 68.0 in | Wt 234.5 lb

## 2017-03-27 DIAGNOSIS — I69398 Other sequelae of cerebral infarction: Secondary | ICD-10-CM

## 2017-03-27 DIAGNOSIS — I69359 Hemiplegia and hemiparesis following cerebral infarction affecting unspecified side: Secondary | ICD-10-CM

## 2017-03-27 DIAGNOSIS — S060X1A Concussion with loss of consciousness of 30 minutes or less, initial encounter: Secondary | ICD-10-CM | POA: Diagnosis not present

## 2017-03-27 DIAGNOSIS — R569 Unspecified convulsions: Secondary | ICD-10-CM

## 2017-03-27 MED ORDER — LEVETIRACETAM 500 MG PO TABS
500.0000 mg | ORAL_TABLET | Freq: Two times a day (BID) | ORAL | 3 refills | Status: DC
Start: 2017-03-27 — End: 2017-09-24

## 2017-03-27 NOTE — Patient Instructions (Signed)
    We will start Keppra 500 mg twice a day. Begin by taking 1/2 tablet twice a day for 2 weeks, then take one full tablet twice a day.  We will get a CT of the head.
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
## 2017-04-02 ENCOUNTER — Ambulatory Visit: Payer: Self-pay | Admitting: Neurology

## 2017-04-03 ENCOUNTER — Ambulatory Visit
Admission: RE | Admit: 2017-04-03 | Discharge: 2017-04-03 | Disposition: A | Discharge status: Home / Self Care | Payer: Medicaid Other | Source: Ambulatory Visit | Attending: Neurology | Admitting: Neurology

## 2017-04-03 DIAGNOSIS — S060X1A Concussion with loss of consciousness of 30 minutes or less, initial encounter: Secondary | ICD-10-CM

## 2017-04-05 ENCOUNTER — Other Ambulatory Visit: Payer: Self-pay | Admitting: Neurology

## 2017-04-05 ENCOUNTER — Telehealth: Payer: Self-pay | Admitting: Neurology

## 2017-04-05 MED ORDER — MIRTAZAPINE 45 MG PO TABS: 45.0000 mg | ORAL_TABLET | Freq: Every day | ORAL | 1 refills | Status: DC

## 2017-04-05 NOTE — Telephone Encounter (Signed)
I will refill the Remeron.

## 2017-04-05 NOTE — Telephone Encounter (Signed)
Left vm that DR .Willis did send the medication Remeron to [facility] on [address]. Rn also left message that DR. Anne Hahn spoke with her about MRI results.

## 2017-04-05 NOTE — Telephone Encounter (Signed)
Patient just called stating she spoke to her pharmacy [facility] and they said they have not received a fax on her medication Remron 45 mg. She also stated she missed Dr. Anne Hahn call who just call about 30 minutes ago. She stated she never heard it ring.

## 2017-04-05 NOTE — Telephone Encounter (Signed)
I called patient. CT of the head was unchanged, no evidence of intracranial trauma following head injury. Old right middle cerebral artery stroke was seen.   CT head 04/04/17:  IMPRESSION:  Abnormal CT head (without) demonstrating: 1. Chronic right middle cerebral artery ischemic infarction. 2. No acute findings.

## 2017-04-10 ENCOUNTER — Telehealth: Payer: Self-pay | Admitting: Neurology

## 2017-04-10 ENCOUNTER — Encounter: Payer: Self-pay | Admitting: Rehabilitative and Restorative Service Providers"

## 2017-04-10 NOTE — Telephone Encounter (Signed)
I called the patient. I'm not aware that Cassandra Wall should show up on any medication on a urine drug screen.  The patient will find out what is going on with the urine drug screen sometime this week.

## 2017-04-10 NOTE — Therapy (Signed)
[facility] [address] [place], [place], [address] Phone: [phone]   Fax:  [phone]  Patient Details  Name: Cassandra Wall MRN: [id number] Date of Birth: 22-Oct-1980  Encounter Date: last encounter 02/15/17  PHYSICAL THERAPY DISCHARGE SUMMARY  Visits from Start of Care: 2  Current functional level related to goals / functional outcomes:     PT Long Term Goals - 01/17/17 1427      PT LONG TERM GOAL #1   Title The patient will return demo HEP with written cues.  TARGET DATE 03/03/17   Baseline No HEP at this time.   Time 4   Period Weeks     PT LONG TERM GOAL #2   Title The patient will improve neck AROM extension to 20 degrees.    Baseline 6 degrees   Time 4   Period Weeks     PT LONG TERM GOAL #3   Title The patient will improve L cervical rotation to > or equal to 70 degrees.   Baseline 56 degrees   Time 4   Period Weeks     PT LONG TERM GOAL #4   Title The patient will improve L shoulder AROM to > or equal to 100 deg flexion, 90 degrees abduction.   Baseline 80 degrees flexion, 62 degrees abduction   Time 4   Period Weeks     PT LONG TERM GOAL #5   Title Provide further community resources for post d/c activities due to financial limitations.   Baseline No community exercise at this time.   Time 4   Period Weeks     Additional Long Term Goals   Additional Long Term Goals Yes     PT LONG TERM GOAL #6   Title Reduce neck disability index from 76% to < or equal to 60%.   Baseline 76%   Time 4   Period Weeks        Remaining deficits: Patient did not return for further therapy--see PT initial eval for status.   Education / Equipment: Home program.  Plan: Patient agrees to discharge.  Patient goals were not met. Patient is being discharged due to not returning since the last visit.  ?????      Thank you for the referral of this patient. Rudell Cobb,  MPT   Mohamed Portlock 04/10/2017, 10:59 AM  [facility] [address] [place], [place], [address] Phone: [phone]   Fax:  [phone]

## 2017-04-10 NOTE — Telephone Encounter (Signed)
Patient called office in reference to levETIRAcetam (KEPPRA) 500 MG tablet.  Patient states her pain management did a urine drug screen with a false/positive result (patient does not know for what).  Patient would like to know is it possible this medication can effect a urine drug screen.  Please call

## 2017-05-10 ENCOUNTER — Other Ambulatory Visit (HOSPITAL_COMMUNITY): Payer: Self-pay | Admitting: Nurse Practitioner

## 2017-05-10 DIAGNOSIS — K7400 Hepatic fibrosis, unspecified: Secondary | ICD-10-CM

## 2017-05-10 DIAGNOSIS — B182 Chronic viral hepatitis C: Secondary | ICD-10-CM

## 2017-05-21 ENCOUNTER — Other Ambulatory Visit: Payer: Self-pay | Admitting: General Surgery

## 2017-05-21 ENCOUNTER — Other Ambulatory Visit: Payer: Self-pay | Admitting: Radiology

## 2017-05-21 ENCOUNTER — Other Ambulatory Visit: Payer: Self-pay | Admitting: Student

## 2017-05-22 ENCOUNTER — Ambulatory Visit (HOSPITAL_COMMUNITY): Admission: RE | Admit: 2017-05-22 | Payer: Medicaid Other | Source: Ambulatory Visit

## 2017-06-17 ENCOUNTER — Other Ambulatory Visit: Payer: Self-pay | Admitting: Radiology

## 2017-06-18 ENCOUNTER — Ambulatory Visit: Payer: Medicaid Other | Admitting: Adult Health

## 2017-06-20 ENCOUNTER — Other Ambulatory Visit (HOSPITAL_COMMUNITY): Payer: Self-pay | Admitting: Nurse Practitioner

## 2017-06-20 ENCOUNTER — Ambulatory Visit (HOSPITAL_COMMUNITY)
Admission: RE | Admit: 2017-06-20 | Discharge: 2017-06-20 | Disposition: A | Discharge status: Home / Self Care | Payer: Medicaid Other | Source: Ambulatory Visit | Attending: Nurse Practitioner | Admitting: Nurse Practitioner

## 2017-06-20 ENCOUNTER — Encounter (HOSPITAL_COMMUNITY): Payer: Self-pay

## 2017-06-20 ENCOUNTER — Encounter (HOSPITAL_COMMUNITY): Payer: Self-pay | Admitting: *Deleted

## 2017-06-20 DIAGNOSIS — K7400 Hepatic fibrosis, unspecified: Secondary | ICD-10-CM

## 2017-06-20 DIAGNOSIS — K739 Chronic hepatitis, unspecified: Secondary | ICD-10-CM | POA: Diagnosis not present

## 2017-06-20 DIAGNOSIS — Z79899 Other long term (current) drug therapy: Secondary | ICD-10-CM | POA: Insufficient documentation

## 2017-06-20 DIAGNOSIS — I69398 Other sequelae of cerebral infarction: Secondary | ICD-10-CM | POA: Diagnosis not present

## 2017-06-20 DIAGNOSIS — B182 Chronic viral hepatitis C: Secondary | ICD-10-CM

## 2017-06-20 DIAGNOSIS — N189 Chronic kidney disease, unspecified: Secondary | ICD-10-CM | POA: Insufficient documentation

## 2017-06-20 DIAGNOSIS — F17210 Nicotine dependence, cigarettes, uncomplicated: Secondary | ICD-10-CM | POA: Diagnosis not present

## 2017-06-20 DIAGNOSIS — G40909 Epilepsy, unspecified, not intractable, without status epilepticus: Secondary | ICD-10-CM | POA: Diagnosis not present

## 2017-06-20 DIAGNOSIS — M1990 Unspecified osteoarthritis, unspecified site: Secondary | ICD-10-CM | POA: Insufficient documentation

## 2017-06-20 DIAGNOSIS — F419 Anxiety disorder, unspecified: Secondary | ICD-10-CM | POA: Diagnosis not present

## 2017-06-20 DIAGNOSIS — G43909 Migraine, unspecified, not intractable, without status migrainosus: Secondary | ICD-10-CM | POA: Diagnosis not present

## 2017-06-20 DIAGNOSIS — R7989 Other specified abnormal findings of blood chemistry: Secondary | ICD-10-CM | POA: Diagnosis present

## 2017-06-20 HISTORY — PX: IR US GUIDE BX ASP/DRAIN: IMG2392

## 2017-06-20 HISTORY — PX: IR RADIOLOGY PERIPHERAL GUIDED IV START: IMG5598

## 2017-06-20 HISTORY — PX: IR US GUIDE VASC ACCESS RIGHT: IMG2390

## 2017-06-20 LAB — CBC WITH DIFFERENTIAL/PLATELET
Basophils Absolute: 0 10*3/uL (ref 0.0–0.1)
Basophils Relative: 0 %
Eosinophils Absolute: 0.1 10*3/uL (ref 0.0–0.7)
Eosinophils Relative: 1 %
HEMATOCRIT: 43.5 % (ref 36.0–46.0)
HEMOGLOBIN: 13.6 g/dL (ref 12.0–15.0)
LYMPHS ABS: 2.3 10*3/uL (ref 0.7–4.0)
Lymphocytes Relative: 33 %
MCH: 28.4 pg (ref 26.0–34.0)
MCHC: 31.3 g/dL (ref 30.0–36.0)
MCV: 90.8 fL (ref 78.0–100.0)
MONO ABS: 0.4 10*3/uL (ref 0.1–1.0)
MONOS PCT: 6 %
NEUTROS ABS: 4.2 10*3/uL (ref 1.7–7.7)
NEUTROS PCT: 60 %
Platelets: 130 10*3/uL — ABNORMAL LOW (ref 150–400)
RBC: 4.79 MIL/uL (ref 3.87–5.11)
RDW: 14.5 % (ref 11.5–15.5)
WBC: 7 10*3/uL (ref 4.0–10.5)

## 2017-06-20 LAB — PROTIME-INR
INR: 1.07
Prothrombin Time: 13.8 seconds (ref 11.4–15.2)

## 2017-06-20 MED ORDER — MIDAZOLAM HCL 2 MG/2ML IJ SOLN
INTRAMUSCULAR | Status: AC
  Filled 2017-06-20: qty 4

## 2017-06-20 MED ORDER — GELATIN ABSORBABLE 12-7 MM EX MISC
CUTANEOUS | Status: AC
  Filled 2017-06-20: qty 1

## 2017-06-20 MED ORDER — MIDAZOLAM HCL 2 MG/2ML IJ SOLN
INTRAMUSCULAR | Status: AC | PRN
  Administered 2017-06-20 (×2): 1 mg via INTRAVENOUS

## 2017-06-20 MED ORDER — LIDOCAINE HCL (PF) 1 % IJ SOLN
INTRAMUSCULAR | Status: DC | PRN
  Administered 2017-06-20: 20 mL

## 2017-06-20 MED ORDER — LIDOCAINE HCL (PF) 1 % IJ SOLN
INTRAMUSCULAR | Status: AC
  Filled 2017-06-20: qty 30

## 2017-06-20 MED ORDER — FENTANYL CITRATE (PF) 100 MCG/2ML IJ SOLN
INTRAMUSCULAR | Status: AC
  Filled 2017-06-20: qty 4

## 2017-06-20 MED ORDER — FENTANYL CITRATE (PF) 100 MCG/2ML IJ SOLN
INTRAMUSCULAR | Status: AC | PRN
  Administered 2017-06-20 (×3): 50 ug via INTRAVENOUS

## 2017-06-20 MED ORDER — GELATIN ABSORBABLE 12-7 MM EX MISC
CUTANEOUS | Status: DC | PRN
  Administered 2017-06-20: 1 via TOPICAL

## 2017-06-20 MED ORDER — SODIUM CHLORIDE 0.9 % IV SOLN
INTRAVENOUS | Status: AC | PRN
  Administered 2017-06-20: 10 mL/h via INTRAVENOUS

## 2017-06-20 MED ORDER — SODIUM CHLORIDE 0.9 % IV SOLN: INTRAVENOUS | Status: DC

## 2017-06-20 NOTE — Sedation Documentation (Signed)
Patient is resting comfortably. 
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
## 2017-06-20 NOTE — Procedures (Signed)
Pre Procedure Dx: Elevated LFTs Post Procedural Dx: Same  Technically successful US guided biopsy of right lobe of the liver.  EBL: None  No immediate complications.   Jay Sigfredo Schreier, MD Pager #: [phone]    

## 2017-06-20 NOTE — Sedation Documentation (Addendum)
IV access obtained by Dr. Grace IsaacWatts using Ultrasound in Right upper extremity

## 2017-06-20 NOTE — Discharge Instructions (Signed)
Liver Biopsy, Care After  Refer to this sheet in the next few weeks. These instructions provide you with information on caring for yourself after your procedure. Your health care provider may also give you more specific instructions. Your treatment has been planned according to current medical practices, but problems sometimes occur. Call your health care provider if you have any problems or questions after your procedure.  What can I expect after the procedure?  After your procedure, it is typical to have the following:  · A small amount of discomfort in the area where the biopsy was done and in the right shoulder or shoulder blade.  · A small amount of bruising around the area where the biopsy was done and on the skin over the liver.  · Sleepiness and fatigue for the rest of the day.    Follow these instructions at home:  · Rest at home for 1-2 days or as directed by your health care provider.  · Have a friend or family member stay with you for at least 24 hours.  · Because of the medicines used during the procedure, you should not do the following things in the first 24 hours:  ? Drive.  ? Use machinery.  ? Be responsible for the care of other people.  ? Sign legal documents.  ? Take a bath or shower.  · There are many different ways to close and cover an incision, including stitches, skin glue, and adhesive strips. Follow your health care provider's instructions on:  ? Incision care.  ? Bandage (dressing) changes and removal.  ? Incision closure removal.  · Do not drink alcohol in the first week.  · Do not lift more than 5 pounds or play contact sports for 2 weeks after this test.  · Take medicines only as directed by your health care provider. Do not take medicine containing aspirin or non-steroidal anti-inflammatory medicines such as ibuprofen for 1 week after this test.  · It is your responsibility to get your test results.  Contact a health care provider if:  · You have increased bleeding from an incision  that results in more than a small spot of blood.  · You have redness, swelling, or increasing pain in any incisions.  · You notice a discharge or a bad smell coming from any of your incisions.  · You have a fever or chills.  Get help right away if:  · You develop swelling, bloating, or pain in your abdomen.  · You become dizzy or faint.  · You develop a rash.  · You are nauseous or vomit.  · You have difficulty breathing, feel short of breath, or feel faint.  · You develop chest pain.  · You have problems with your speech or vision.  · You have trouble balancing or moving your arms or legs.  This information is not intended to replace advice given to you by your health care provider. Make sure you discuss any questions you have with your health care provider.  Document Released: 01/12/2005 Document Revised: 12/01/2015 Document Reviewed: 08/21/2013  Elsevier Interactive Patient Education © 2018 [employer]

## 2017-07-15 ENCOUNTER — Telehealth: Payer: Self-pay | Admitting: Adult Health

## 2017-07-15 ENCOUNTER — Telehealth: Payer: Self-pay | Admitting: Neurology

## 2017-07-15 NOTE — Telephone Encounter (Signed)
I called the patient.  She claims that her doctor wants her to come off of the Remeron, she currently takes 45 mg daily.  The patient is not clear why this was recommended to her.  She will be seeing him in the next 10 days.  If she does come off the medication, I would recommend a slow taper using the 15 mg tablets.  The patient will call me in the future regarding this medication.

## 2017-07-15 NOTE — Telephone Encounter (Signed)
Pts Dr  is telling her to come off mirtazapine (REMERON) 45 MG tablet pt is wanting to know if that is ok to do or not

## 2017-07-15 NOTE — Telephone Encounter (Signed)
Error

## 2017-07-25 ENCOUNTER — Encounter (HOSPITAL_COMMUNITY): Payer: Self-pay

## 2017-07-30 MED ORDER — MIRTAZAPINE 15 MG PO TABS: 15.0000 mg | ORAL_TABLET | Freq: Every day | ORAL | 1 refills | Status: DC

## 2017-07-30 NOTE — Telephone Encounter (Signed)
I called the patient.  The patient is followed through psychiatry.  They have recommended that she taper down to 15 mg of Remeron at night.  We will go to 30 mg at night for 2 weeks and then go to 15 mg at night.  The patient apparently has been placed on Topamax at night, the patient does report that she has a lot of problems with sleeping.  If her sleeping pattern worsens coming down off the Remeron, she is to call her psychiatrist.

## 2017-07-30 NOTE — Addendum Note (Signed)
Addended by: York SpanielWILLIS, CHARLES K on: 07/30/2017 08:50 AM   Modules accepted: Orders

## 2017-07-30 NOTE — Telephone Encounter (Signed)
Pt has called so that Dr Anne HahnWillis could be made aware that she has spoken with her psychiatrist and pt states she would now like to move forward in having the mirtazapine (REMERON) 45 MG tablet decreased to 15 mg.  Pt has asked for a call back.

## 2017-08-12 ENCOUNTER — Ambulatory Visit: Payer: Medicaid Other | Admitting: Obstetrics & Gynecology

## 2017-08-23 ENCOUNTER — Ambulatory Visit: Payer: Medicaid Other | Admitting: Obstetrics & Gynecology

## 2017-09-24 ENCOUNTER — Encounter: Payer: Self-pay | Admitting: Adult Health

## 2017-09-24 ENCOUNTER — Ambulatory Visit: Payer: Medicaid Other | Admitting: Adult Health

## 2017-09-24 ENCOUNTER — Telehealth: Payer: Self-pay | Admitting: Neurology

## 2017-09-24 VITALS — HR 65 | Ht 68.0 in | Wt 225.6 lb

## 2017-09-24 DIAGNOSIS — R569 Unspecified convulsions: Secondary | ICD-10-CM

## 2017-09-24 DIAGNOSIS — Z8673 Personal history of transient ischemic attack (TIA), and cerebral infarction without residual deficits: Secondary | ICD-10-CM

## 2017-09-24 DIAGNOSIS — R259 Unspecified abnormal involuntary movements: Secondary | ICD-10-CM | POA: Diagnosis not present

## 2017-09-24 DIAGNOSIS — I69359 Hemiplegia and hemiparesis following cerebral infarction affecting unspecified side: Secondary | ICD-10-CM | POA: Diagnosis not present

## 2017-09-24 DIAGNOSIS — I69398 Other sequelae of cerebral infarction: Secondary | ICD-10-CM | POA: Diagnosis not present

## 2017-09-24 MED ORDER — LEVETIRACETAM 500 MG PO TABS: 500.0000 mg | ORAL_TABLET | Freq: Two times a day (BID) | ORAL | 3 refills | Status: AC
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
## 2017-09-24 NOTE — Telephone Encounter (Signed)
I discussed with Cassandra Wall today, it is Ok to schedule injection the same day of her first visit with me

## 2017-09-24 NOTE — Progress Notes (Signed)
I have read the note, and I agree with the clinical assessment and plan.  Charles K Willis   

## 2017-09-24 NOTE — Telephone Encounter (Signed)
[facility] Would you like to inject the day of consultation?

## 2017-09-24 NOTE — Patient Instructions (Addendum)
Your Plan:  Continue keppra 500mg  twice a day  Follow up in office for botox injections into shoulder with Dr. Terrace ArabiaYan  Follow up in 6 months with Aundra MilletMegan, NP for seizures or call earlier if needed    Thank you for coming [facility]. I hope we have been able to provide you high quality care today.  You may receive a patient satisfaction survey over the next few weeks. We would appreciate your feedback and comments so that we may continue to improve ourselves and the health of our patients.

## 2017-09-24 NOTE — Telephone Encounter (Signed)
Noted, thank you

## 2017-10-09 ENCOUNTER — Telehealth: Payer: Self-pay | Admitting: Neurology

## 2017-10-09 ENCOUNTER — Encounter: Payer: Self-pay | Admitting: Neurology

## 2017-10-09 ENCOUNTER — Ambulatory Visit: Payer: Medicaid Other | Admitting: Neurology

## 2017-10-09 VITALS — BP 121/82 | HR 68 | Ht 68.0 in | Wt 226.0 lb

## 2017-10-09 DIAGNOSIS — M542 Cervicalgia: Secondary | ICD-10-CM | POA: Diagnosis not present

## 2017-10-09 DIAGNOSIS — I679 Cerebrovascular disease, unspecified: Secondary | ICD-10-CM | POA: Diagnosis not present

## 2017-10-09 DIAGNOSIS — G8114 Spastic hemiplegia affecting left nondominant side: Secondary | ICD-10-CM | POA: Diagnosis not present
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
## 2017-10-09 NOTE — Telephone Encounter (Signed)
Medicaid order sent to [facility] they will obtain the auth and they will contact the pt to schedule.  °

## 2017-10-21 ENCOUNTER — Ambulatory Visit
Admission: RE | Admit: 2017-10-21 | Discharge: 2017-10-21 | Disposition: A | Discharge status: Home / Self Care | Payer: Medicaid Other | Source: Ambulatory Visit | Attending: Neurology | Admitting: Neurology

## 2017-10-21 DIAGNOSIS — M542 Cervicalgia: Secondary | ICD-10-CM

## 2017-10-22 ENCOUNTER — Telehealth: Payer: Self-pay | Admitting: Neurology

## 2017-10-22 NOTE — Telephone Encounter (Signed)
Please call patient, MRI of the cervical spine showed mild degenerative changes but there is no significant evidence of spinal cord or nerve root compression.   IMPRESSION: This MRI of the cervical spine without contrast shows the following: 1.    There are mild degenerative changes at C5-C6 and C6-C7 as detailed above that do not lead to nerve root compression. 2.     Reversal of the cervical curvature.  This could be due to positioning within the magnet or to muscle spasming.

## 2017-10-23 NOTE — Telephone Encounter (Signed)
Spoke to patient she is aware of results

## 2017-12-04 ENCOUNTER — Ambulatory Visit: Payer: Medicaid Other | Admitting: Neurology

## 2017-12-11 ENCOUNTER — Telehealth: Payer: Self-pay | Admitting: Neurology

## 2017-12-11 NOTE — Telephone Encounter (Signed)
I called to check status of the patients medication. They stated that the pharmacist needed to clarify the frequency of injections. I spoke with a pharmacist and gave her this information. I was then transferred to the insurance benefits department who said that insurance information has still not been put in. I told her I would call back.

## 2017-12-11 NOTE — Telephone Encounter (Signed)
I called to check status of the patients Xeomin medication with [facility]. I spoke with Asheryl. It is still pending insurance.    [facility] [phone] (03/10/18).   I called the patient to make her aware that it was pending at the pharmacy and she would need to call and give consent on Monday if she has not heard anything from them. She said that she already had their phone number and would call them.

## 2017-12-12 NOTE — Telephone Encounter (Signed)
I called and spoke with the pharmacy. They are pending patient consent. I called to make the patient aware but she did not answer so I left a VM asking her to call me back.

## 2017-12-12 NOTE — Telephone Encounter (Signed)
I called the patient and conferenced the pharmacy in with our call. Patient gave consent. We scheduled delivery for TBD 12/17/17.

## 2017-12-17 NOTE — Telephone Encounter (Signed)
Medication is here signed it out and gave it to KohlerMichelle.

## 2017-12-18 ENCOUNTER — Encounter: Payer: Self-pay | Admitting: *Deleted

## 2017-12-18 DIAGNOSIS — G8114 Spastic hemiplegia affecting left nondominant side: Secondary | ICD-10-CM | POA: Insufficient documentation

## 2017-12-19 ENCOUNTER — Ambulatory Visit: Payer: Medicaid Other | Admitting: Neurology

## 2017-12-19 ENCOUNTER — Encounter: Payer: Self-pay | Admitting: Neurology

## 2017-12-19 VITALS — BP 109/71 | HR 84 | Ht 68.0 in | Wt 227.8 lb

## 2017-12-19 DIAGNOSIS — I679 Cerebrovascular disease, unspecified: Principal | ICD-10-CM

## 2017-12-19 DIAGNOSIS — G8114 Spastic hemiplegia affecting left nondominant side: Secondary | ICD-10-CM

## 2017-12-19 NOTE — Progress Notes (Signed)
**  Xeomin 100 units x 3 vials, NDC [id number], Lot [id number], Exp 12/2019, specialty pharmacy.//mck,rn**
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
## 2018-02-05 ENCOUNTER — Emergency Department (HOSPITAL_COMMUNITY)
Admission: EM | Admit: 2018-02-05 | Discharge: 2018-02-05 | Discharge status: Against Medical Advice | Payer: Medicaid Other | Attending: Emergency Medicine | Admitting: Emergency Medicine

## 2018-02-05 ENCOUNTER — Encounter (HOSPITAL_COMMUNITY): Payer: Self-pay | Admitting: Emergency Medicine

## 2018-02-05 DIAGNOSIS — Z5321 Procedure and treatment not carried out due to patient leaving prior to being seen by health care provider: Secondary | ICD-10-CM | POA: Insufficient documentation

## 2018-02-05 DIAGNOSIS — Y939 Activity, unspecified: Secondary | ICD-10-CM | POA: Insufficient documentation

## 2018-02-05 DIAGNOSIS — X58XXXA Exposure to other specified factors, initial encounter: Secondary | ICD-10-CM | POA: Diagnosis not present

## 2018-02-05 DIAGNOSIS — Z79899 Other long term (current) drug therapy: Secondary | ICD-10-CM | POA: Diagnosis not present

## 2018-02-05 DIAGNOSIS — F17210 Nicotine dependence, cigarettes, uncomplicated: Secondary | ICD-10-CM | POA: Insufficient documentation

## 2018-02-05 DIAGNOSIS — T192XXA Foreign body in vulva and vagina, initial encounter: Secondary | ICD-10-CM | POA: Diagnosis not present

## 2018-02-05 DIAGNOSIS — Y929 Unspecified place or not applicable: Secondary | ICD-10-CM | POA: Insufficient documentation

## 2018-02-05 DIAGNOSIS — Z91040 Latex allergy status: Secondary | ICD-10-CM | POA: Insufficient documentation

## 2018-02-05 DIAGNOSIS — I69354 Hemiplegia and hemiparesis following cerebral infarction affecting left non-dominant side: Secondary | ICD-10-CM | POA: Diagnosis not present

## 2018-02-05 DIAGNOSIS — Y999 Unspecified external cause status: Secondary | ICD-10-CM | POA: Insufficient documentation

## 2018-02-05 DIAGNOSIS — N189 Chronic kidney disease, unspecified: Secondary | ICD-10-CM | POA: Diagnosis not present

## 2018-02-05 DIAGNOSIS — R102 Pelvic and perineal pain: Secondary | ICD-10-CM | POA: Insufficient documentation

## 2018-02-05 LAB — CBC WITH DIFFERENTIAL/PLATELET
BASOS ABS: 0 10*3/uL (ref 0.0–0.1)
Basophils Relative: 0 %
EOS PCT: 1 %
Eosinophils Absolute: 0.1 10*3/uL (ref 0.0–0.7)
HCT: 42.5 % (ref 36.0–46.0)
Hemoglobin: 13.7 g/dL (ref 12.0–15.0)
LYMPHS PCT: 38 %
Lymphs Abs: 3.1 10*3/uL (ref 0.7–4.0)
MCH: 30.2 pg (ref 26.0–34.0)
MCHC: 32.2 g/dL (ref 30.0–36.0)
MCV: 93.8 fL (ref 78.0–100.0)
MONO ABS: 0.5 10*3/uL (ref 0.1–1.0)
MONOS PCT: 6 %
NEUTROS ABS: 4.5 10*3/uL (ref 1.7–7.7)
Neutrophils Relative %: 55 %
PLATELETS: 137 10*3/uL — AB (ref 150–400)
RBC: 4.53 MIL/uL (ref 3.87–5.11)
RDW: 13.6 % (ref 11.5–15.5)
WBC: 8.1 10*3/uL (ref 4.0–10.5)

## 2018-02-05 LAB — URINALYSIS, ROUTINE W REFLEX MICROSCOPIC
Bilirubin Urine: NEGATIVE
Glucose, UA: NEGATIVE mg/dL
KETONES UR: NEGATIVE mg/dL
Leukocytes, UA: NEGATIVE
Nitrite: NEGATIVE
PH: 6 (ref 5.0–8.0)
Protein, ur: NEGATIVE mg/dL
RBC / HPF: >50 RBC/hpf — ABNORMAL HIGH (ref 0–5)
SPECIFIC GRAVITY, URINE: 1.018 (ref 1.005–1.030)

## 2018-02-05 LAB — COMPREHENSIVE METABOLIC PANEL
ALBUMIN: 4 g/dL (ref 3.5–5.0)
ALK PHOS: 82 U/L (ref 38–126)
ALT: 13 U/L (ref 0–44)
ANION GAP: 6 (ref 5–15)
AST: 16 U/L (ref 15–41)
BUN: 14 mg/dL (ref 6–20)
CALCIUM: 8.8 mg/dL — AB (ref 8.9–10.3)
CO2: 25 mmol/L (ref 22–32)
Chloride: 109 mmol/L (ref 98–111)
Creatinine, Ser: 0.82 mg/dL (ref 0.44–1.00)
Glucose, Bld: 85 mg/dL (ref 70–99)
POTASSIUM: 4 mmol/L (ref 3.5–5.1)
Sodium: 140 mmol/L (ref 135–145)
TOTAL PROTEIN: 7.2 g/dL (ref 6.5–8.1)
Total Bilirubin: 0.5 mg/dL (ref 0.3–1.2)

## 2018-02-05 LAB — WET PREP, GENITAL
CLUE CELLS WET PREP: NONE SEEN
Sperm: NONE SEEN
TRICH WET PREP: NONE SEEN
YEAST WET PREP: NONE SEEN

## 2018-02-05 LAB — I-STAT CG4 LACTIC ACID, ED: Lactic Acid, Venous: 0.91 mmol/L (ref 0.5–1.9)

## 2018-02-05 LAB — PREGNANCY, URINE: PREG TEST UR: NEGATIVE

## 2018-02-05 MED ORDER — KETOROLAC TROMETHAMINE 30 MG/ML IJ SOLN
30.0000 mg | Freq: Once | INTRAMUSCULAR | Status: AC
  Administered 2018-02-05: 30 mg via INTRAVENOUS
  Filled 2018-02-05: qty 1

## 2018-02-05 MED ORDER — SODIUM CHLORIDE 0.9 % IV BOLUS
1000.0000 mL | Freq: Once | INTRAVENOUS | Status: AC
  Administered 2018-02-05: 1000 mL via INTRAVENOUS

## 2018-02-05 NOTE — ED Notes (Signed)
Patient stated to writer that she did not have care for her son at home and needed to leave. Writer notified PA that patient was wanting to leave and "time was not on her side".

## 2018-02-05 NOTE — Discharge Instructions (Addendum)
Please return for any worsening abdominal pain, fever, chills, vaginal discharge, or any new or worsening symptoms.  Your wet prep is still pending.  If you have bacterial vaginosis, I will call in a prescription for Flagyl and send you a message in the medical record.  If this is the case, he will be prescribed Flagyl, 500 mill grams twice daily for 7 days.  This medication cannot be taken with alcohol.

## 2018-02-05 NOTE — ED Notes (Signed)
Patient ripped IV out and left it at bedside- RN aware.

## 2018-02-05 NOTE — ED Triage Notes (Signed)
Patient here from home reporting that she has a tampon stuck in vagina x2 days.

## 2018-02-05 NOTE — ED Notes (Signed)
This nurse was made aware pt was leaving AMA. When entering the room found pt had pulled out IV, saline bag spilling onto floor. States she has to go she has a son at home and is about to lose her ride. This nurse explained she would be leaving AMA and the health risk. Pt states she knows that insurance will pay for it no matter what and she is going to leave.
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
## 2018-02-07 LAB — GC/CHLAMYDIA PROBE AMP (~~LOC~~) NOT AT ARMC
CHLAMYDIA, DNA PROBE: NEGATIVE
Neisseria Gonorrhea: NEGATIVE

## 2018-02-11 LAB — CULTURE, BLOOD (ROUTINE X 2)
Culture: NO GROWTH
Culture: NO GROWTH
Special Requests: ADEQUATE
Special Requests: ADEQUATE

## 2018-03-26 ENCOUNTER — Ambulatory Visit: Payer: Medicaid Other | Admitting: Neurology

## 2018-04-01 ENCOUNTER — Ambulatory Visit: Payer: Medicaid Other | Admitting: Adult Health

## 2019-02-10 ENCOUNTER — Other Ambulatory Visit: Payer: Self-pay

## 2019-02-10 DIAGNOSIS — Z20822 Contact with and (suspected) exposure to covid-19: Secondary | ICD-10-CM

## 2019-02-11 LAB — NOVEL CORONAVIRUS, NAA: SARS-CoV-2, NAA: NOT DETECTED

## 2019-04-23 ENCOUNTER — Other Ambulatory Visit: Payer: Self-pay | Admitting: Internal Medicine

## 2019-04-27 ENCOUNTER — Other Ambulatory Visit: Payer: Self-pay | Admitting: Internal Medicine

## 2019-04-29 ENCOUNTER — Other Ambulatory Visit: Payer: Self-pay | Admitting: Internal Medicine

## 2019-04-29 DIAGNOSIS — R1031 Right lower quadrant pain: Secondary | ICD-10-CM

## 2019-04-29 DIAGNOSIS — R109 Unspecified abdominal pain: Secondary | ICD-10-CM

## 2019-05-06 ENCOUNTER — Other Ambulatory Visit: Payer: Self-pay | Admitting: Internal Medicine

## 2019-05-06 DIAGNOSIS — R109 Unspecified abdominal pain: Secondary | ICD-10-CM

## 2019-05-06 DIAGNOSIS — R1084 Generalized abdominal pain: Secondary | ICD-10-CM

## 2019-05-06 DIAGNOSIS — R102 Pelvic and perineal pain: Secondary | ICD-10-CM

## 2019-05-13 ENCOUNTER — Ambulatory Visit
Admission: RE | Admit: 2019-05-13 | Discharge: 2019-05-13 | Disposition: A | Discharge status: Home / Self Care | Payer: Medicaid Other | Source: Ambulatory Visit | Attending: Internal Medicine | Admitting: Internal Medicine

## 2019-05-13 DIAGNOSIS — R102 Pelvic and perineal pain: Secondary | ICD-10-CM

## 2019-05-13 DIAGNOSIS — R1084 Generalized abdominal pain: Secondary | ICD-10-CM

## 2019-05-13 DIAGNOSIS — R109 Unspecified abdominal pain: Secondary | ICD-10-CM

## 2019-08-24 ENCOUNTER — Encounter: Payer: Self-pay | Admitting: Certified Nurse Midwife

## 2019-10-12 ENCOUNTER — Encounter: Payer: Self-pay | Admitting: Certified Nurse Midwife

## 2019-10-23 ENCOUNTER — Encounter: Payer: Self-pay | Admitting: Certified Nurse Midwife

## 2023-01-11 ENCOUNTER — Ambulatory Visit (HOSPITAL_COMMUNITY)
Admission: EM | Admit: 2023-01-11 | Discharge: 2023-01-11 | Disposition: A | Discharge status: Home / Self Care | Payer: Medicaid Other

## 2023-01-11 ENCOUNTER — Encounter (HOSPITAL_COMMUNITY): Payer: Self-pay | Admitting: *Deleted

## 2023-01-11 ENCOUNTER — Ambulatory Visit (INDEPENDENT_AMBULATORY_CARE_PROVIDER_SITE_OTHER): Payer: Medicaid Other

## 2023-01-11 DIAGNOSIS — L03011 Cellulitis of right finger: Secondary | ICD-10-CM | POA: Diagnosis not present

## 2023-01-11 DIAGNOSIS — Z23 Encounter for immunization: Secondary | ICD-10-CM

## 2023-01-11 MED ORDER — TETANUS-DIPHTH-ACELL PERTUSSIS 5-2.5-18.5 LF-MCG/0.5 IM SUSY
PREFILLED_SYRINGE | INTRAMUSCULAR | Status: AC
  Filled 2023-01-11: qty 0.5

## 2023-01-11 MED ORDER — TETANUS-DIPHTH-ACELL PERTUSSIS 5-2.5-18.5 LF-MCG/0.5 IM SUSY
0.5000 mL | PREFILLED_SYRINGE | Freq: Once | INTRAMUSCULAR | Status: AC
  Administered 2023-01-11: 0.5 mL via INTRAMUSCULAR

## 2023-01-11 MED ORDER — DOXYCYCLINE HYCLATE 100 MG PO CAPS: 100.0000 mg | ORAL_CAPSULE | Freq: Two times a day (BID) | ORAL | 0 refills | Status: DC

## 2023-01-11 NOTE — Discharge Instructions (Addendum)
Your x-ray is negative for bony abnormalities and evidence of foreign body to the thumb.  I am concerned your thumb may be infected.  Take doxycycline antibiotic twice a day for 10 days. Soak your thumb in epsom salt water to reduce swelling.   Schedule an appointment with Dr. Merlyn Lot hand specialist for follow-up in the next 3 to 5 days.  If you develop any new or worsening symptoms or do not improve in the next 2 to 3 days, please return.  If your symptoms are severe, please go to the emergency room.  Follow-up with your primary care provider for further evaluation and management of your symptoms as well as ongoing wellness visits.  I hope you feel better!
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
## 2023-01-11 NOTE — ED Triage Notes (Signed)
Pt states she was cleaning the grill with a metal brush and she felt something in her right thumb x 2 days ago. She states since the finger is swollen and red. She states she has cleaned it and used epsom salt, taking IBU as needed.

## 2023-01-24 ENCOUNTER — Ambulatory Visit (HOSPITAL_COMMUNITY)
Admission: EM | Admit: 2023-01-24 | Discharge: 2023-01-24 | Disposition: A | Discharge status: Home / Self Care | Payer: Medicaid Other

## 2023-01-24 ENCOUNTER — Encounter (HOSPITAL_BASED_OUTPATIENT_CLINIC_OR_DEPARTMENT_OTHER): Payer: Self-pay

## 2023-01-24 ENCOUNTER — Emergency Department (HOSPITAL_BASED_OUTPATIENT_CLINIC_OR_DEPARTMENT_OTHER)
Admission: EM | Admit: 2023-01-24 | Discharge: 2023-01-24 | Discharge status: Against Medical Advice | Payer: Medicaid Other | Attending: Emergency Medicine | Admitting: Emergency Medicine

## 2023-01-24 ENCOUNTER — Other Ambulatory Visit: Payer: Self-pay

## 2023-01-24 ENCOUNTER — Encounter (HOSPITAL_COMMUNITY): Payer: Self-pay

## 2023-01-24 ENCOUNTER — Emergency Department (HOSPITAL_BASED_OUTPATIENT_CLINIC_OR_DEPARTMENT_OTHER): Payer: Medicaid Other

## 2023-01-24 DIAGNOSIS — Z5329 Procedure and treatment not carried out because of patient's decision for other reasons: Secondary | ICD-10-CM | POA: Insufficient documentation

## 2023-01-24 DIAGNOSIS — M79644 Pain in right finger(s): Secondary | ICD-10-CM | POA: Diagnosis present

## 2023-01-24 DIAGNOSIS — L03019 Cellulitis of unspecified finger: Secondary | ICD-10-CM | POA: Diagnosis not present

## 2023-01-24 DIAGNOSIS — Z91040 Latex allergy status: Secondary | ICD-10-CM | POA: Insufficient documentation

## 2023-01-24 DIAGNOSIS — L03011 Cellulitis of right finger: Secondary | ICD-10-CM | POA: Insufficient documentation

## 2023-01-24 LAB — BASIC METABOLIC PANEL
Anion gap: 10 (ref 5–15)
BUN: 17 mg/dL (ref 6–20)
CO2: 24 mmol/L (ref 22–32)
Calcium: 9.1 mg/dL (ref 8.9–10.3)
Chloride: 102 mmol/L (ref 98–111)
Creatinine, Ser: 0.68 mg/dL (ref 0.44–1.00)
GFR, Estimated: >60 mL/min (ref >60)
Glucose, Bld: 90 mg/dL (ref 70–99)
Potassium: 3.9 mmol/L (ref 3.5–5.1)
Sodium: 136 mmol/L (ref 135–145)

## 2023-01-24 LAB — CBC WITH DIFFERENTIAL/PLATELET
Abs Immature Granulocytes: 0.03 10*3/uL (ref 0.00–0.07)
Basophils Absolute: 0 10*3/uL (ref 0.0–0.1)
Basophils Relative: 1 %
Eosinophils Absolute: 0 10*3/uL (ref 0.0–0.5)
Eosinophils Relative: 1 %
HCT: 44 % (ref 36.0–46.0)
Hemoglobin: 14.3 g/dL (ref 12.0–15.0)
Immature Granulocytes: 1 %
Lymphocytes Relative: 31 %
Lymphs Abs: 2.1 10*3/uL (ref 0.7–4.0)
MCH: 29.2 pg (ref 26.0–34.0)
MCHC: 32.5 g/dL (ref 30.0–36.0)
MCV: 89.8 fL (ref 80.0–100.0)
Monocytes Absolute: 0.4 10*3/uL (ref 0.1–1.0)
Monocytes Relative: 7 %
Neutro Abs: 4 10*3/uL (ref 1.7–7.7)
Neutrophils Relative %: 59 %
Platelets: 201 10*3/uL (ref 150–400)
RBC: 4.9 MIL/uL (ref 3.87–5.11)
RDW: 14 % (ref 11.5–15.5)
WBC: 6.7 10*3/uL (ref 4.0–10.5)
nRBC: 0 % (ref 0.0–0.2)

## 2023-01-24 MED ORDER — VANCOMYCIN HCL IN DEXTROSE 1-5 GM/200ML-% IV SOLN: 1000.0000 mg | Freq: Once | INTRAVENOUS | Status: DC

## 2023-01-24 MED ORDER — VANCOMYCIN HCL IN DEXTROSE 1-5 GM/200ML-% IV SOLN
1000.0000 mg | Freq: Once | INTRAVENOUS | Status: DC
  Filled 2023-01-24: qty 200

## 2023-01-24 MED ORDER — VANCOMYCIN HCL IN DEXTROSE 1-5 GM/200ML-% IV SOLN
1000.0000 mg | Freq: Once | INTRAVENOUS | Status: AC
  Administered 2023-01-24: 1000 mg via INTRAVENOUS
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
## 2023-01-24 NOTE — Discharge Instructions (Signed)
You have a serious infection that needs to be treated.  You need to go directly to [facility] once you are able to plan for admission to the hospital.

## 2023-01-24 NOTE — ED Notes (Addendum)
.  Patient is being discharged from the Urgent Care and sent to the Emergency Department via POV . Per Cathlean Marseilles, NP, patient is in need of higher level of care due to felon of finger. Patient is aware and verbalizes understanding of plan of care.  Vitals:   01/24/23 1353 01/24/23 1355  BP:  120/62  Pulse: (!) 113   Resp: 16   Temp: 98.7 F (37.1 C)   SpO2: 98%
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
## 2023-01-24 NOTE — ED Triage Notes (Signed)
Pt states she has an infected right thumb.  Seen here for the same on  01/11/2023.  States she finished her antibiotics but her thumb is still swollen and red. States she did not see the hand surgeon because she cannot afford the co pay.

## 2023-01-24 NOTE — ED Provider Notes (Signed)
[place] [facility] Provider Note   CSN: [id number] Arrival date & time: 01/24/23  1902     History  Chief Complaint  Patient presents with   Finger Injury    R thumb    Cassandra Wall is a 42 y.o. female.  Patient is a 42 year old female who presents with infection to her thumb.  She says it started about 10 days ago when she got a piece of wire from a wire grill brush in her thumb.  She went to urgent care and was started on doxycycline for presumed felon.  She was told to follow-up with hand surgery but she said she was unable to afford the co-pay.  Although she did tell the triage nurse that she has an appointment already July 22.  She has been on doxycycline for 10 days and says its overall been getting worse.  She went to urgent care today and was sent here for concern for gangrene.  She denies any fevers.  She does admit to substance use.  She says she used cocaine yesterday although denies any today.  She says she drinks alcohol about 3 or 4 times a week.  She says she denies any alcohol use today.  She denies any other complaints.       Home Medications Prior to Admission medications   Medication Sig Start Date End Date Taking? Authorizing Provider  clonazePAM (KLONOPIN) 1 MG tablet Take 1 mg by mouth 3 (three) times daily.    [provider]  doxycycline (VIBRAMYCIN) 100 MG capsule Take 1 capsule (100 mg total) by mouth 2 (two) times daily. 01/11/23   Carlisle Beers, FNP  EPINEPHrine 0.3 mg/0.3 mL IJ SOAJ injection USE FOR ANAPHYLAXIS 11/20/16   [provider]  hydrOXYzine (ATARAX) 25 MG tablet TAKE 1 TABLET BY MOUTH 4 TIMES A DAY FOR FIVE DAYS    [provider]  levETIRAcetam (KEPPRA) 500 MG tablet Take 1 tablet (500 mg total) by mouth 2 (two) times daily. 09/24/17   Ihor Austin, NP  metoprolol tartrate (LOPRESSOR) 50 MG tablet Take 50 mg by mouth 2 (two) times daily. 12/01/17   [provider]  mirtazapine (REMERON) 15 MG tablet Take 1 tablet (15 mg total) by mouth at bedtime. 07/30/17   York Spaniel, MD  prazosin (MINIPRESS) 1 MG capsule Take 3 mg by mouth at bedtime. 01/01/18   [provider]  propranolol (INDERAL) 10 MG tablet SMARTSIG:1 Tablet(s) By Mouth 07/19/22   [provider]  QUEtiapine (SEROQUEL XR) 300 MG 24 hr tablet Take 300 mg by mouth at bedtime.    [provider]  QUEtiapine (SEROQUEL XR) 50 MG TB24 24 hr tablet TAKE 2 TABLETS (100 MG) AT BEDTIME FOR INSOMNIA 10/05/17   [provider]      Allergies    Wheat, Ciprofloxacin hcl, Gluten meal, and Latex    Review of Systems   Review of Systems  Constitutional:  Negative for fever.  Gastrointestinal:  Negative for nausea and vomiting.  Musculoskeletal:  Positive for arthralgias and joint swelling. Negative for back pain and neck pain.  Skin:  Positive for wound.  Neurological:  Negative for weakness, numbness and headaches.    Physical Exam Updated Vital Signs BP (!) 140/91   Pulse (!) 107   Temp (!) 97.5 F (36.4 C)   Resp 20   Wt 88.5 kg   LMP 01/10/2023 (Approximate)   SpO2 98%   BMI 29.65  kg/m  Physical Exam Constitutional:      Appearance: She is well-developed.  HENT:     Head: Normocephalic and atraumatic.  Cardiovascular:     Rate and Rhythm: Normal rate.  Pulmonary:     Effort: Pulmonary effort is normal.  Musculoskeletal:        General: Tenderness present.     Cervical back: Normal range of motion and neck supple.     Comments: Patient has diffuse swelling of the distal aspect of the right thumb.  There is discoloration of the volar surface of the thumb.  She reported previously that there was purulent drainage from this area.  She has some skin sloughing.  I do not feel any specific area of a drainable abscess.  Skin:    General: Skin is warm and dry.  Neurological:     Mental Status: She is alert and oriented to person, place, and time.      ED Results / Procedures / Treatments   Labs (all labs ordered are listed, but only abnormal results are displayed) Labs Reviewed  CULTURE, BLOOD (ROUTINE X 2)  CULTURE, BLOOD (ROUTINE X 2)  BASIC METABOLIC PANEL  CBC WITH DIFFERENTIAL/PLATELET  RAPID URINE DRUG SCREEN, HOSP PERFORMED    EKG None  Radiology DG Finger Thumb Right  Result Date: 01/24/2023 CLINICAL DATA:  Finger injury EXAM: RIGHT THUMB 2+V COMPARISON:  Radiographs 01/11/2023 FINDINGS: No acute fracture or dislocation. Mild joint space loss at the IP joint. Question soft tissue injury about the tip of the thumb. IMPRESSION: No acute osseous abnormality. Electronically Signed   By: Minerva Fester M.D.   On: 01/24/2023 20:23    Procedures Procedures    Medications Ordered in ED Medications  vancomycin (VANCOCIN) IVPB 1000 mg/200 mL premix (0 mg Intravenous Stopped 01/24/23 2308)    Followed by  vancomycin (VANCOCIN) IVPB 1000 mg/200 mL premix (has no administration in time range)    ED Course/ Medical Decision Making/ A&P                             Medical Decision Making Amount and/or Complexity of Data Reviewed Labs: ordered. Radiology: ordered.  Risk Prescription drug management.   Patient is a 42 year old female who presents with worsening pain to her right thumb.  She had x-rays done which were interpreted by me and confirmed by the radiologist to show no evidence of foreign body.  No osseous involvement.  There is some diffuse soft tissue swelling.  I do not appreciate a specific area of abscess although it looks diffusely swollen and infected concerning with a felon that now has some necrosis on the volar surface.  I discussed this with the hand surgeon on-call, Dr. Kerry Fort, who reviewed pictures that were in the chart and at this point does not feel that there is likely a drainable infection.  He recommends admission for IV antibiotics.  Patient was given a dose of IV vancomycin.  Labs  reviewed and are nonconcerning, blood cultures are obtained.  I had put a page out to the hospitalist for admission and patient decided that she was going to refuse admission.  She states that she has to go pick up her son and does not have anyone else that can do it.  I did advise her that she could potentially lose her finger if the infection does not get aggressively treated.  She acknowledges this and says that she is she picks up  her son, she will proceed to [facility] for admission.  Final Clinical Impression(s) / ED Diagnoses Final diagnoses:  Felon of finger    Rx / DC Orders ED Discharge Orders     None         Rolan Bucco, MD 01/24/23 2314

## 2023-01-24 NOTE — ED Triage Notes (Signed)
Pt c/o infection to R thumb, seen at [facility] for same, was on abx & was advised to see a hand surgeon, has follow up 7/22. Concern for continued infection r/t appearance of finger

## 2023-01-24 NOTE — Discharge Instructions (Signed)
Please go straight to the ER for further evaluation/management of the finger infection

## 2023-01-26 ENCOUNTER — Other Ambulatory Visit: Payer: Self-pay

## 2023-01-26 ENCOUNTER — Emergency Department (HOSPITAL_COMMUNITY): Payer: Medicaid Other

## 2023-01-26 ENCOUNTER — Observation Stay (HOSPITAL_COMMUNITY): Payer: Medicaid Other | Admitting: Certified Registered Nurse Anesthetist

## 2023-01-26 ENCOUNTER — Encounter (HOSPITAL_COMMUNITY)
Admission: EM | Disposition: A | Discharge status: Home / Self Care | Payer: Self-pay | Source: Home / Self Care | Attending: Internal Medicine

## 2023-01-26 ENCOUNTER — Inpatient Hospital Stay (HOSPITAL_COMMUNITY)
Admission: EM | Admit: 2023-01-26 | Discharge: 2023-01-28 | DRG: 464 | Disposition: A | Discharge status: Home / Self Care | Payer: Medicaid Other | Attending: Internal Medicine | Admitting: Internal Medicine

## 2023-01-26 ENCOUNTER — Observation Stay (HOSPITAL_BASED_OUTPATIENT_CLINIC_OR_DEPARTMENT_OTHER): Payer: Medicaid Other | Admitting: Certified Registered Nurse Anesthetist

## 2023-01-26 ENCOUNTER — Encounter (HOSPITAL_COMMUNITY): Payer: Self-pay

## 2023-01-26 DIAGNOSIS — F17210 Nicotine dependence, cigarettes, uncomplicated: Secondary | ICD-10-CM | POA: Diagnosis present

## 2023-01-26 DIAGNOSIS — Z79899 Other long term (current) drug therapy: Secondary | ICD-10-CM

## 2023-01-26 DIAGNOSIS — M009 Pyogenic arthritis, unspecified: Secondary | ICD-10-CM

## 2023-01-26 DIAGNOSIS — M869 Osteomyelitis, unspecified: Principal | ICD-10-CM | POA: Diagnosis present

## 2023-01-26 DIAGNOSIS — F419 Anxiety disorder, unspecified: Secondary | ICD-10-CM | POA: Diagnosis present

## 2023-01-26 DIAGNOSIS — Z8619 Personal history of other infectious and parasitic diseases: Secondary | ICD-10-CM

## 2023-01-26 DIAGNOSIS — F1490 Cocaine use, unspecified, uncomplicated: Secondary | ICD-10-CM | POA: Diagnosis present

## 2023-01-26 DIAGNOSIS — I361 Nonrheumatic tricuspid (valve) insufficiency: Secondary | ICD-10-CM | POA: Diagnosis not present

## 2023-01-26 DIAGNOSIS — Z91040 Latex allergy status: Secondary | ICD-10-CM

## 2023-01-26 DIAGNOSIS — M86241 Subacute osteomyelitis, right hand: Secondary | ICD-10-CM | POA: Diagnosis not present

## 2023-01-26 DIAGNOSIS — F418 Other specified anxiety disorders: Secondary | ICD-10-CM

## 2023-01-26 DIAGNOSIS — I69354 Hemiplegia and hemiparesis following cerebral infarction affecting left non-dominant side: Secondary | ICD-10-CM

## 2023-01-26 DIAGNOSIS — G4700 Insomnia, unspecified: Secondary | ICD-10-CM | POA: Diagnosis present

## 2023-01-26 DIAGNOSIS — I679 Cerebrovascular disease, unspecified: Secondary | ICD-10-CM

## 2023-01-26 DIAGNOSIS — F1290 Cannabis use, unspecified, uncomplicated: Secondary | ICD-10-CM | POA: Diagnosis present

## 2023-01-26 DIAGNOSIS — I071 Rheumatic tricuspid insufficiency: Secondary | ICD-10-CM | POA: Diagnosis present

## 2023-01-26 DIAGNOSIS — G40909 Epilepsy, unspecified, not intractable, without status epilepticus: Secondary | ICD-10-CM | POA: Diagnosis present

## 2023-01-26 DIAGNOSIS — B9562 Methicillin resistant Staphylococcus aureus infection as the cause of diseases classified elsewhere: Secondary | ICD-10-CM | POA: Diagnosis present

## 2023-01-26 DIAGNOSIS — Z813 Family history of other psychoactive substance abuse and dependence: Secondary | ICD-10-CM

## 2023-01-26 DIAGNOSIS — L03011 Cellulitis of right finger: Secondary | ICD-10-CM | POA: Diagnosis present

## 2023-01-26 DIAGNOSIS — L089 Local infection of the skin and subcutaneous tissue, unspecified: Principal | ICD-10-CM

## 2023-01-26 DIAGNOSIS — F319 Bipolar disorder, unspecified: Secondary | ICD-10-CM | POA: Diagnosis present

## 2023-01-26 DIAGNOSIS — F1390 Sedative, hypnotic, or anxiolytic use, unspecified, uncomplicated: Secondary | ICD-10-CM | POA: Diagnosis present

## 2023-01-26 DIAGNOSIS — Z881 Allergy status to other antibiotic agents status: Secondary | ICD-10-CM

## 2023-01-26 DIAGNOSIS — R9431 Abnormal electrocardiogram [ECG] [EKG]: Secondary | ICD-10-CM | POA: Diagnosis present

## 2023-01-26 HISTORY — DX: Family history of other specified conditions: Z84.89

## 2023-01-26 HISTORY — PX: I & D EXTREMITY: SHX5045

## 2023-01-26 HISTORY — DX: Myocarditis, unspecified: I51.4

## 2023-01-26 LAB — CBC WITH DIFFERENTIAL/PLATELET
Abs Immature Granulocytes: 0.02 10*3/uL (ref 0.00–0.07)
Basophils Absolute: 0 10*3/uL (ref 0.0–0.1)
Basophils Relative: 0 %
Eosinophils Absolute: 0.1 10*3/uL (ref 0.0–0.5)
Eosinophils Relative: 1 %
HCT: 47.5 % — ABNORMAL HIGH (ref 36.0–46.0)
Hemoglobin: 14.7 g/dL (ref 12.0–15.0)
Immature Granulocytes: 0 %
Lymphocytes Relative: 40 %
Lymphs Abs: 3.2 10*3/uL (ref 0.7–4.0)
MCH: 28.8 pg (ref 26.0–34.0)
MCHC: 30.9 g/dL (ref 30.0–36.0)
MCV: 93.1 fL (ref 80.0–100.0)
Monocytes Absolute: 0.6 10*3/uL (ref 0.1–1.0)
Monocytes Relative: 7 %
Neutro Abs: 4.2 10*3/uL (ref 1.7–7.7)
Neutrophils Relative %: 52 %
Platelets: 224 10*3/uL (ref 150–400)
RBC: 5.1 MIL/uL (ref 3.87–5.11)
RDW: 14 % (ref 11.5–15.5)
WBC: 8 10*3/uL (ref 4.0–10.5)
nRBC: 0 % (ref 0.0–0.2)

## 2023-01-26 LAB — HIV ANTIBODY (ROUTINE TESTING W REFLEX): HIV Screen 4th Generation wRfx: NONREACTIVE

## 2023-01-26 LAB — PROTIME-INR
INR: 1 (ref 0.8–1.2)
Prothrombin Time: 13.5 seconds (ref 11.4–15.2)

## 2023-01-26 LAB — COMPREHENSIVE METABOLIC PANEL
ALT: 16 U/L (ref 0–44)
AST: 20 U/L (ref 15–41)
Albumin: 4.2 g/dL (ref 3.5–5.0)
Alkaline Phosphatase: 81 U/L (ref 38–126)
Anion gap: 11 (ref 5–15)
BUN: 15 mg/dL (ref 6–20)
CO2: 26 mmol/L (ref 22–32)
Calcium: 9.8 mg/dL (ref 8.9–10.3)
Chloride: 104 mmol/L (ref 98–111)
Creatinine, Ser: 0.96 mg/dL (ref 0.44–1.00)
GFR, Estimated: >60 mL/min (ref >60)
Glucose, Bld: 97 mg/dL (ref 70–99)
Potassium: 3.6 mmol/L (ref 3.5–5.1)
Sodium: 141 mmol/L (ref 135–145)
Total Bilirubin: 0.6 mg/dL (ref 0.3–1.2)
Total Protein: 7.6 g/dL (ref 6.5–8.1)

## 2023-01-26 LAB — I-STAT CG4 LACTIC ACID, ED: Lactic Acid, Venous: 0.7 mmol/L (ref 0.5–1.9)

## 2023-01-26 LAB — AEROBIC/ANAEROBIC CULTURE W GRAM STAIN (SURGICAL/DEEP WOUND): Gram Stain: NONE SEEN

## 2023-01-26 LAB — HCG, SERUM, QUALITATIVE: Preg, Serum: NEGATIVE

## 2023-01-26 SURGERY — IRRIGATION AND DEBRIDEMENT EXTREMITY
Anesthesia: General | Site: Finger | Laterality: Right

## 2023-01-26 MED ORDER — ACETAMINOPHEN 160 MG/5ML PO SOLN: 1000.0000 mg | Freq: Once | ORAL | Status: DC | PRN

## 2023-01-26 MED ORDER — FENTANYL CITRATE (PF) 250 MCG/5ML IJ SOLN
INTRAMUSCULAR | Status: AC
  Filled 2023-01-26: qty 5

## 2023-01-26 MED ORDER — MIDAZOLAM HCL 2 MG/2ML IJ SOLN
INTRAMUSCULAR | Status: DC | PRN
  Administered 2023-01-26: 2 mg via INTRAVENOUS

## 2023-01-26 MED ORDER — OXYCODONE HCL 5 MG PO TABS
5.0000 mg | ORAL_TABLET | Freq: Four times a day (QID) | ORAL | Status: DC | PRN
  Administered 2023-01-27 (×3): 5 mg via ORAL
  Filled 2023-01-26 (×4): qty 1

## 2023-01-26 MED ORDER — ORAL CARE MOUTH RINSE: 15.0000 mL | Freq: Once | OROMUCOSAL | Status: AC

## 2023-01-26 MED ORDER — ENOXAPARIN SODIUM 40 MG/0.4ML IJ SOSY
40.0000 mg | PREFILLED_SYRINGE | INTRAMUSCULAR | Status: DC
  Administered 2023-01-27 – 2023-01-28 (×2): 40 mg via SUBCUTANEOUS
  Filled 2023-01-26 (×3): qty 0.4

## 2023-01-26 MED ORDER — LIDOCAINE 2% (20 MG/ML) 5 ML SYRINGE
INTRAMUSCULAR | Status: DC | PRN
  Administered 2023-01-26: 60 mg via INTRAVENOUS

## 2023-01-26 MED ORDER — PHENYLEPHRINE 80 MCG/ML (10ML) SYRINGE FOR IV PUSH (FOR BLOOD PRESSURE SUPPORT)
PREFILLED_SYRINGE | INTRAVENOUS | Status: AC
  Filled 2023-01-26: qty 10

## 2023-01-26 MED ORDER — ONDANSETRON HCL 4 MG/2ML IJ SOLN
INTRAMUSCULAR | Status: DC | PRN
  Administered 2023-01-26: 4 mg via INTRAVENOUS

## 2023-01-26 MED ORDER — MIDAZOLAM HCL 2 MG/2ML IJ SOLN
INTRAMUSCULAR | Status: AC
  Filled 2023-01-26: qty 2

## 2023-01-26 MED ORDER — ONDANSETRON HCL 4 MG/2ML IJ SOLN
INTRAMUSCULAR | Status: AC
  Filled 2023-01-26: qty 2

## 2023-01-26 MED ORDER — DEXAMETHASONE SODIUM PHOSPHATE 10 MG/ML IJ SOLN
INTRAMUSCULAR | Status: AC
  Filled 2023-01-26: qty 1

## 2023-01-26 MED ORDER — HYDROMORPHONE HCL 1 MG/ML IJ SOLN
INTRAMUSCULAR | Status: AC
  Filled 2023-01-26: qty 1

## 2023-01-26 MED ORDER — CHLORHEXIDINE GLUCONATE 0.12 % MT SOLN
OROMUCOSAL | Status: AC
  Administered 2023-01-26: 15 mL via OROMUCOSAL
  Filled 2023-01-26: qty 15

## 2023-01-26 MED ORDER — ACETAMINOPHEN 10 MG/ML IV SOLN
1000.0000 mg | Freq: Once | INTRAVENOUS | Status: DC | PRN
  Administered 2023-01-26: 1000 mg via INTRAVENOUS

## 2023-01-26 MED ORDER — BUPIVACAINE HCL (PF) 0.25 % IJ SOLN
INTRAMUSCULAR | Status: AC
  Filled 2023-01-26: qty 30

## 2023-01-26 MED ORDER — CHLORHEXIDINE GLUCONATE 0.12 % MT SOLN: 15.0000 mL | Freq: Once | OROMUCOSAL | Status: AC

## 2023-01-26 MED ORDER — 0.9 % SODIUM CHLORIDE (POUR BTL) OPTIME
TOPICAL | Status: DC | PRN
  Administered 2023-01-26: 1000 mL

## 2023-01-26 MED ORDER — ACETAMINOPHEN 650 MG RE SUPP: 650.0000 mg | Freq: Four times a day (QID) | RECTAL | Status: DC

## 2023-01-26 MED ORDER — ALBUTEROL SULFATE (2.5 MG/3ML) 0.083% IN NEBU: 2.5000 mg | INHALATION_SOLUTION | RESPIRATORY_TRACT | Status: DC | PRN

## 2023-01-26 MED ORDER — SUCCINYLCHOLINE CHLORIDE 200 MG/10ML IV SOSY
PREFILLED_SYRINGE | INTRAVENOUS | Status: AC
  Filled 2023-01-26: qty 10

## 2023-01-26 MED ORDER — BUPIVACAINE HCL (PF) 0.25 % IJ SOLN
INTRAMUSCULAR | Status: DC | PRN
  Administered 2023-01-26: 10 mL

## 2023-01-26 MED ORDER — OXYCODONE HCL 5 MG/5ML PO SOLN: 5.0000 mg | Freq: Once | ORAL | Status: DC | PRN

## 2023-01-26 MED ORDER — FENTANYL CITRATE (PF) 250 MCG/5ML IJ SOLN
INTRAMUSCULAR | Status: DC | PRN
  Administered 2023-01-26 (×2): 50 ug via INTRAVENOUS

## 2023-01-26 MED ORDER — OXYCODONE HCL 5 MG PO TABS: 5.0000 mg | ORAL_TABLET | Freq: Once | ORAL | Status: DC | PRN

## 2023-01-26 MED ORDER — HYDROMORPHONE HCL 1 MG/ML IJ SOLN: 0.2500 mg | INTRAMUSCULAR | Status: DC | PRN

## 2023-01-26 MED ORDER — ACETAMINOPHEN 10 MG/ML IV SOLN
INTRAVENOUS | Status: AC
  Filled 2023-01-26: qty 100

## 2023-01-26 MED ORDER — SENNOSIDES-DOCUSATE SODIUM 8.6-50 MG PO TABS: 1.0000 | ORAL_TABLET | Freq: Every evening | ORAL | Status: DC | PRN

## 2023-01-26 MED ORDER — LACTATED RINGERS IV SOLN: INTRAVENOUS | Status: DC

## 2023-01-26 MED ORDER — GUAIFENESIN-CODEINE 100-10 MG/5ML PO SOLN
5.0000 mL | Freq: Four times a day (QID) | ORAL | Status: DC | PRN
  Filled 2023-01-26: qty 5

## 2023-01-26 MED ORDER — ACETAMINOPHEN 325 MG PO TABS
650.0000 mg | ORAL_TABLET | Freq: Four times a day (QID) | ORAL | Status: DC
  Administered 2023-01-26 – 2023-01-27 (×3): 650 mg via ORAL
  Filled 2023-01-26 (×4): qty 2

## 2023-01-26 MED ORDER — DEXMEDETOMIDINE HCL IN NACL 80 MCG/20ML IV SOLN
INTRAVENOUS | Status: DC | PRN
  Administered 2023-01-26 (×2): 8 ug via INTRAVENOUS

## 2023-01-26 MED ORDER — ACETAMINOPHEN 325 MG PO TABS: 650.0000 mg | ORAL_TABLET | Freq: Four times a day (QID) | ORAL | Status: DC | PRN

## 2023-01-26 MED ORDER — PROPOFOL 10 MG/ML IV BOLUS
INTRAVENOUS | Status: AC
  Filled 2023-01-26: qty 20

## 2023-01-26 MED ORDER — ACETAMINOPHEN 650 MG RE SUPP: 650.0000 mg | Freq: Four times a day (QID) | RECTAL | Status: DC | PRN

## 2023-01-26 MED ORDER — ACETAMINOPHEN 500 MG PO TABS: 1000.0000 mg | ORAL_TABLET | Freq: Once | ORAL | Status: DC | PRN

## 2023-01-26 MED ORDER — PROPOFOL 10 MG/ML IV BOLUS
INTRAVENOUS | Status: DC | PRN
  Administered 2023-01-26: 180 mg via INTRAVENOUS

## 2023-01-26 MED ORDER — HYDROXYZINE HCL 25 MG PO TABS: 25.0000 mg | ORAL_TABLET | Freq: Three times a day (TID) | ORAL | Status: DC | PRN

## 2023-01-26 MED ORDER — LIDOCAINE 2% (20 MG/ML) 5 ML SYRINGE
INTRAMUSCULAR | Status: AC
  Filled 2023-01-26: qty 15

## 2023-01-26 MED ORDER — VANCOMYCIN HCL 750 MG/150ML IV SOLN
750.0000 mg | Freq: Three times a day (TID) | INTRAVENOUS | Status: DC
  Administered 2023-01-26 – 2023-01-27 (×3): 750 mg via INTRAVENOUS
  Filled 2023-01-26 (×6): qty 150

## 2023-01-26 MED ORDER — VANCOMYCIN HCL 1750 MG/350ML IV SOLN
1750.0000 mg | Freq: Once | INTRAVENOUS | Status: AC
  Administered 2023-01-26: 1750 mg via INTRAVENOUS
  Filled 2023-01-26: qty 350

## 2023-01-26 SURGICAL SUPPLY — 20 items
BNDG COHESIVE 1X5 TAN STRL LF (GAUZE/BANDAGES/DRESSINGS) IMPLANT
GAUZE XEROFORM 1X8 LF (GAUZE/BANDAGES/DRESSINGS) ×1 IMPLANT
GLOVE BIOGEL M 8.0 STRL (GLOVE) ×1 IMPLANT
GOWN STRL REUS W/ TWL LRG LVL3 (GOWN DISPOSABLE) ×2 IMPLANT
GOWN STRL REUS W/TWL LRG LVL3 (GOWN DISPOSABLE) ×2
HIBICLENS CHG 4% 4OZ BTL (MISCELLANEOUS) IMPLANT
KIT BASIN OR (CUSTOM PROCEDURE TRAY) ×1 IMPLANT
KIT TURNOVER KIT B (KITS) ×1 IMPLANT
NDL HYPO 25GX1X1/2 BEV (NEEDLE) IMPLANT
NEEDLE HYPO 25GX1X1/2 BEV (NEEDLE) ×1 IMPLANT
NS IRRIG 1000ML POUR BTL (IV SOLUTION) ×1 IMPLANT
PACK ORTHO EXTREMITY (CUSTOM PROCEDURE TRAY) ×1 IMPLANT
PAD ARMBOARD 7.5X6 YLW CONV (MISCELLANEOUS) ×2 IMPLANT
SWAB COLLECTION DEVICE MRSA (MISCELLANEOUS) IMPLANT
SWAB CULTURE ESWAB REG 1ML (MISCELLANEOUS) IMPLANT
SYR CONTROL 10ML LL (SYRINGE) IMPLANT
TOWEL GREEN STERILE (TOWEL DISPOSABLE) ×1 IMPLANT
TUBE CONNECTING 12X1/4 (SUCTIONS) ×1 IMPLANT
WATER STERILE IRR 1000ML POUR (IV SOLUTION) ×1 IMPLANT
YANKAUER SUCT BULB TIP NO VENT (SUCTIONS) ×1 IMPLANT

## 2023-01-26 NOTE — ED Notes (Signed)
Pt instructed to remove all belongings and place them in belongings bag to prepare for surgery.

## 2023-01-26 NOTE — ED Provider Notes (Addendum)
[place] [facility] AT [facility] Provider Note   CSN: [id number] Arrival date & time: 01/26/23  0530     History  Chief Complaint  Patient presents with   Wound Infection    Cassandra Wall is a 42 y.o. female.  Patient is a 42 year old female with a past medical history of recurrent cocaine use, prior history of IV drug abuse with endocarditis 7 years ago but denies any active IV drug use who takes no prescription medications at this time presenting with complaint of worsening thumb pain and swelling on the right thumb.  Patient reports about 3 weeks ago she thought she had some metal or glass in her thumb which she did report eventually came out but there had been some drainage of pus in the area just never got better.  She did get a Tdap at urgent care several weeks ago and had completed a 10-day course of doxycycline but was not getting better.  She was seen 2 days ago at [facility] where she was thought to have no drainable abscess but needed admission for IV antibiotics.  She did receive a dose of vancomycin but then had to leave to deal with a family issue.  She has not had any antibiotics since that time.  Today she reports the area is looking worse and is throbbing.  She has not had any systemic symptoms such as fever, nausea or vomiting.  The history is provided by the patient and medical records.       Home Medications Prior to Admission medications   Medication Sig Start Date End Date Taking? Authorizing Provider  clonazePAM (KLONOPIN) 1 MG tablet Take 1 mg by mouth 3 (three) times daily.    [provider]  doxycycline (VIBRAMYCIN) 100 MG capsule Take 1 capsule (100 mg total) by mouth 2 (two) times daily. 01/11/23   Carlisle Beers, FNP  EPINEPHrine 0.3 mg/0.3 mL IJ SOAJ injection USE FOR ANAPHYLAXIS 11/20/16   [provider]  hydrOXYzine (ATARAX) 25 MG tablet TAKE 1 TABLET BY MOUTH 4 TIMES A DAY FOR FIVE DAYS     [provider]  levETIRAcetam (KEPPRA) 500 MG tablet Take 1 tablet (500 mg total) by mouth 2 (two) times daily. 09/24/17   Ihor Austin, NP  metoprolol tartrate (LOPRESSOR) 50 MG tablet Take 50 mg by mouth 2 (two) times daily. 12/01/17   [provider]  mirtazapine (REMERON) 15 MG tablet Take 1 tablet (15 mg total) by mouth at bedtime. 07/30/17   York Spaniel, MD  prazosin (MINIPRESS) 1 MG capsule Take 3 mg by mouth at bedtime. 01/01/18   [provider]  propranolol (INDERAL) 10 MG tablet SMARTSIG:1 Tablet(s) By Mouth 07/19/22   [provider]  QUEtiapine (SEROQUEL XR) 300 MG 24 hr tablet Take 300 mg by mouth at bedtime.    [provider]  QUEtiapine (SEROQUEL XR) 50 MG TB24 24 hr tablet TAKE 2 TABLETS (100 MG) AT BEDTIME FOR INSOMNIA 10/05/17   [provider]      Allergies    Wheat, Ciprofloxacin hcl, Gluten meal, and Latex    Review of Systems   Review of Systems  Physical Exam Updated Vital Signs BP 119/87   Pulse (!) 102   Temp 98.3 F (36.8 C)   Resp 18   LMP 01/10/2023 (Approximate)   SpO2 99%  Physical Exam Vitals and nursing note reviewed.  Constitutional:      General: She is not  in acute distress.    Appearance: She is well-developed.  HENT:     Head: Normocephalic and atraumatic.  Eyes:     Conjunctiva/sclera: Conjunctivae normal.     Pupils: Pupils are equal, round, and reactive to light.  Cardiovascular:     Rate and Rhythm: Normal rate and regular rhythm.     Heart sounds: Murmur heard.  Pulmonary:     Effort: Pulmonary effort is normal. No respiratory distress.     Breath sounds: Normal breath sounds. No wheezing or rales.  Abdominal:     General: There is no distension.     Palpations: Abdomen is soft.     Tenderness: There is no abdominal tenderness. There is no guarding or rebound.  Musculoskeletal:        General: Tenderness present. Normal range of motion.       Hands:     Cervical back:  Normal range of motion and neck supple.  Skin:    General: Skin is warm and dry.     Findings: No erythema or rash.  Neurological:     Mental Status: She is alert and oriented to person, place, and time. Mental status is at baseline.  Psychiatric:        Behavior: Behavior normal.     ED Results / Procedures / Treatments   Labs (all labs ordered are listed, but only abnormal results are displayed) Labs Reviewed  CBC WITH DIFFERENTIAL/PLATELET - Abnormal; Notable for the following components:      Result Value   HCT 47.5 (*)    All other components within normal limits  CULTURE, BLOOD (ROUTINE X 2)  CULTURE, BLOOD (ROUTINE X 2)  COMPREHENSIVE METABOLIC PANEL  PROTIME-INR  HCG, SERUM, QUALITATIVE  URINALYSIS, W/ REFLEX TO CULTURE (INFECTION SUSPECTED)  I-STAT CG4 LACTIC ACID, ED  I-STAT CG4 LACTIC ACID, ED    EKG None  Radiology DG Finger Thumb Right  Result Date: 01/26/2023 CLINICAL DATA:  Infection. EXAM: RIGHT THUMB 2+V COMPARISON:  None Available. FINDINGS: Subtle cortical loss and demineralization identified in the palmar aspect of the distal phalanx towards the base. IMPRESSION: Subtle demineralization towards the base of the distal phalanx. Imaging features highly suspicious for osteomyelitis. Electronically Signed   By: Kennith Center M.D.   On: 01/26/2023 06:58   DG Chest Portable 1 View  Result Date: 01/26/2023 CLINICAL DATA:  Sepsis. EXAM: PORTABLE CHEST 1 VIEW COMPARISON:  10/26/2015 FINDINGS: The lungs are clear without focal pneumonia, edema, pneumothorax or pleural effusion. Cardiopericardial silhouette is at upper limits of normal for size. No acute bony abnormality. IMPRESSION: No active disease. Electronically Signed   By: Kennith Center M.D.   On: 01/26/2023 06:57   DG Finger Thumb Right  Result Date: 01/24/2023 CLINICAL DATA:  Finger injury EXAM: RIGHT THUMB 2+V COMPARISON:  Radiographs 01/11/2023 FINDINGS: No acute fracture or dislocation. Mild joint space  loss at the IP joint. Question soft tissue injury about the tip of the thumb. IMPRESSION: No acute osseous abnormality. Electronically Signed   By: Minerva Fester M.D.   On: 01/24/2023 20:23    Procedures Procedures    Medications Ordered in ED Medications  vancomycin (VANCOREADY) IVPB 1750 mg/350 mL (1,750 mg Intravenous New Bag/Given 01/26/23 0802)    ED Course/ Medical Decision Making/ A&P                             Medical Decision Making Amount and/or Complexity of Data  Reviewed External Data Reviewed: notes. Labs: ordered. Decision-making details documented in ED Course. Radiology: ordered and independent interpretation performed. Decision-making details documented in ED Course.  Risk Prescription drug management. Decision regarding hospitalization.   Pt with multiple medical problems and comorbidities and presenting today with a complaint that caries a high risk for morbidity and mortality.  Here today with worsening pain and swelling in her right thumb.  Concern for osteomyelitis and persistent infection in the right thumb.  Patient is not displaying symptoms of systemic symptoms such as sepsis at this time.  Vital signs are normal except for minimal tachycardia of 102.  Patient does have a notable heart murmur on exam.  Unclear if this has been persistent since she had endocarditis 7 years ago which was the last time this was evaluated.  Lower suspicion for septic emboli causing the infection in her thumb today.  I independently interpreted patient's labs and CBC within normal limits, lactate within normal limits and CMP unremarkable. I have independently visualized and interpreted pt's images today.  Chest x-ray today without acute findings, right thumb films show some haziness in the bone radiology reports concern for osteomyelitis.  Feel that patient requires admission for IV antibiotics and hand surgery consult.  Consulted Dr. Izora Ribas with hand surgery.  Consulted medicine for  admission.  Discussed the plan with the patient.  She reports she is willing to stay and is comfortable with this plan.          Final Clinical Impression(s) / ED Diagnoses Final diagnoses:  Wound infection  Osteomyelitis of finger of right hand [facility])    Rx / DC Orders ED Discharge Orders     None         Gwyneth Sprout, MD 01/26/23 0745    Gwyneth Sprout, MD 01/26/23 [phone]
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
## 2023-01-26 NOTE — Anesthesia Procedure Notes (Signed)
Procedure Name: LMA Insertion Date/Time: 01/26/2023 3:17 PM  Performed by: Carlos American, CRNAPre-anesthesia Checklist: Patient identified, Emergency Drugs available, Suction available and Patient being monitored Patient Re-evaluated:Patient Re-evaluated prior to induction Oxygen Delivery Method: Circle System Utilized Preoxygenation: Pre-oxygenation with 100% oxygen Induction Type: IV induction Ventilation: Mask ventilation without difficulty LMA: LMA inserted LMA Size: 4.0 Number of attempts: 1 Placement Confirmation: positive ETCO2 Tube secured with: Tape Dental Injury: Teeth and Oropharynx as per pre-operative assessment

## 2023-01-26 NOTE — Anesthesia Preprocedure Evaluation (Signed)
Anesthesia Evaluation  Patient identified by MRN, date of birth, ID band Patient awake    Reviewed: Allergy & Precautions, NPO status , Patient's Chart, lab work & pertinent test results  Airway Mallampati: III  TM Distance: >3 FB Neck ROM: Full    Dental  (+) Dental Advisory Given, Poor Dentition, Missing   Pulmonary neg COPD, Recent URI , Current Smoker and Patient abstained from smoking.   breath sounds clear to auscultation       Cardiovascular  Rhythm:Regular   - Left ventricle: The cavity size was normal. Wall thickness was    normal. Systolic function was normal. The estimated ejection    fraction was in the range of 60% to 65%. Left ventricular    diastolic function parameters were normal.  - Aortic valve: There was moderate regurgitation.  - Right atrium: The atrium was moderately dilated.  - Tricuspid valve: Anterior leaflet of the TV is thickened with    shaggy appearance consistent with prior vegetation. Some TV    prolapse present There is mod to severe TR.     Neuro/Psych Seizures -,  PSYCHIATRIC DISORDERS Anxiety Depression     Neuromuscular disease CVA    GI/Hepatic ,GERD  ,,  Endo/Other    Renal/GU Lab Results      Component                Value               Date                      CREATININE               0.96                01/26/2023                Musculoskeletal  (+) Arthritis ,    Abdominal   Peds  Hematology Lab Results      Component                Value               Date                      WBC                      8.0                 01/26/2023                HGB                      14.7                01/26/2023                HCT                      47.5 (H)            01/26/2023                MCV                      93.1                01/26/2023  PLT                      224                 01/26/2023              Anesthesia Other Findings    Reproductive/Obstetrics                             Anesthesia Physical Anesthesia Plan  ASA: 3  Anesthesia Plan: General   Post-op Pain Management:    Induction: Intravenous  PONV Risk Score and Plan: 2 and Ondansetron and Dexamethasone  Airway Management Planned: LMA  Additional Equipment: None  Intra-op Plan:   Post-operative Plan: Extubation in OR  Informed Consent: I have reviewed the patients History and Physical, chart, labs and discussed the procedure including the risks, benefits and alternatives for the proposed anesthesia with the patient or authorized representative who has indicated his/her understanding and acceptance.     Dental advisory given  Plan Discussed with: CRNA  Anesthesia Plan Comments:        Anesthesia Quick Evaluation

## 2023-01-26 NOTE — ED Triage Notes (Signed)
Pt arrived from home via POV c/o infection in right thumb. Diffuse swelling noted to distal right thumb with discoloration noted. Pt states that she was recently seen for same issue and was supposed to be admitted but had to leave to pick up her daughter.

## 2023-01-26 NOTE — Progress Notes (Signed)
ED Pharmacy Antibiotic Sign Off An antibiotic consult was received from an ED provider for vancomycin per pharmacy dosing for osteomyelitis. A chart review was completed to assess appropriateness.   The following one time order(s) were placed:  Vancomycin 1750mg  x 1   Further antibiotic and/or antibiotic pharmacy consults should be ordered by the admitting provider if indicated.   Thank you for allowing pharmacy to be a part of this patient's care.   Estill Batten, PharmD, BCCCP  Clinical Pharmacist 01/26/23 9:56 AM

## 2023-01-26 NOTE — Progress Notes (Signed)
Pharmacy Antibiotic Note  Cassandra Wall is a 42 y.o. female for which pharmacy has been consulted for vancomycin dosing for  osteomyelitis .  Patient with a history of cocaine use, prior history of IVDU w/ endocarditis 7 years ago. Patient denies any active IVDU. Patient presenting with worsening thumb pain and swelling on the right thumb secondary to metal or glass being stuck there.  SCr 0.96 WBC 8; LA 0.7; T 97.9; HR 88; RR 19  Plan: Vancomycin 1750 mg once then 750 mg q8hr (eAUC 507.2) unless change in renal function Monitor WBC, fever, renal function, cultures De-escalate when able Levels at steady state  Height: 5\' 8"  (172.7 cm) IBW/kg (Calculated) : 63.9  Temp (24hrs), Avg:98.1 F (36.7 C), Min:97.9 F (36.6 C), Max:98.3 F (36.8 C)  Recent Labs  Lab 01/24/23 2115 01/26/23 0622 01/26/23 0636  WBC 6.7 8.0  --   CREATININE 0.68 0.96  --   LATICACIDVEN  --   --  0.7    Estimated Creatinine Clearance: 90.6 mL/min (by C-G formula based on SCr of 0.96 mg/dL).    Allergies  Allergen Reactions   Wheat Anaphylaxis   Ciprofloxacin Hcl     Reports as resistant    Gluten Meal    Latex Hives, Itching and Rash   Microbiology results: Pending  Thank you for allowing pharmacy to be a part of this patient's care.  Delmar Landau, PharmD, BCPS 01/26/2023 10:24 AM ED Clinical Pharmacist -  [phone]

## 2023-01-26 NOTE — Hospital Course (Signed)
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 Izora Ribas  - [ ]   IV vancomycin  Failed outpatient antibiotics Food  Heart murmur 2017 - echocardiogram with TV Septic emboli?

## 2023-01-26 NOTE — Plan of Care (Signed)
  Problem: Education: Goal: Knowledge of General Education information will improve Description: Including pain rating scale, medication(s)/side effects and non-pharmacologic comfort measures Outcome: Progressing   Problem: Clinical Measurements: Goal: Ability to maintain clinical measurements within normal limits will improve Outcome: Progressing Goal: Will remain free from infection Outcome: Progressing   Problem: Activity: Goal: Risk for activity intolerance will decrease Outcome: Progressing   Problem: Nutrition: Goal: Adequate nutrition will be maintained Outcome: Progressing   Problem: Coping: Goal: Level of anxiety will decrease Outcome: Progressing   Problem: Elimination: Goal: Will not experience complications related to bowel motility Outcome: Progressing   Problem: Pain Managment: Goal: General experience of comfort will improve Outcome: Progressing

## 2023-01-26 NOTE — ED Notes (Signed)
Lab called stating they did not get enough urine to run specimen will need recollected and re sent
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
## 2023-01-26 NOTE — Op Note (Unsigned)
NAMEAYLINE, DINGUS MEDICAL RECORD NO: [id number] ACCOUNT NO: [id number] DATE OF BIRTH: [birth year] FACILITY: [facility] LOCATION: [id number] PHYSICIAN: Nabor Thomann C. Izora Ribas, MD  Operative Report   DATE OF PROCEDURE: 01/26/2023  PREOPERATIVE DIAGNOSIS:  Infection of the right thumb with presumed osteomyelitis.  POSTOPERATIVE DIAGNOSIS:  Infection of the right thumb with presumed osteomyelitis.  PROCEDURE: 1.  Incision and drainage of right thumb. 2.  Debridement of skin, subcutaneous tissue, bone. 3.  Local tissue advancement flap closure of wound.  INDICATIONS:  The patient is a 42 year old female presented to the emergency room with history of right thumb draining wound, for which she has been seen previously and been placed on antibiotics.  The thumb still shows signs of infection.  X-ray  revealed possible osteomyelitis of the thumb.  Risks, benefits and alternatives of surgery were discussed with the patient.  She agreed with this course of action.  Consent was obtained.  DESCRIPTION OF PROCEDURE:  The patient was taken to the operating room and placed supine on the operating room table.  Anesthesia was administered.  Timeout was performed.  The patient had received IV vancomycin in the ER and therefore no additional  antibiotics were given.  Extremity was prepped and draped in normal sterile fashion and an Esmarch was used around the wrist for a tourniquet.  The thumb had a scab or eschar on the volar aspect from the nail plate and nail bed more proximally to the  distal interphalangeal joint crease. Elevation of this eschar revealed purulent material in the subcutaneous tissue.  The nail bed lifted cleanly off of the bone dorsally very easily, most likely due to infection.  The soft tissues were sharply debrided  with a scalpel and scissors.  The bone was debrided with a rongeur excising the tip of the distal phalanx back proximally approximately a cm.  The base of the phalanx appeared to be  okay.  The insertion of the flexor tendon was observed.  There did not  appear to be any tracking into the flexor tendon sheath.  After curetting the bone further debriding of skin and soft tissue then, the wound was irrigated with approximately a liter of saline solution.  Next, the skin flaps were arranged and trimmed and  rotated to allow soft tissue coverage of the bone.  The nail bed was trimmed to make a nice closure.  Following the tourniquet was released hemostasis was controlled with direct pressure.  The skin edges were approximated with mattress sutures and the  nail bed was approximated to the end of the short and the thumb.  Cultures were taken initially when this wound was opened.  A sterile dressing was then applied.  The patient was awakened and taken to recovery room in stable condition.   SHY D: 01/26/2023 3:51:02 pm T: 01/26/2023 6:31:00 pm  JOB: [id number]/ [id number]

## 2023-01-26 NOTE — ED Notes (Signed)
Assumed care of patient ambulatory to room with steady gait c/o 15 day hx of right thumb infection. Patient seen several times at UC placed on abx did not improve was seen and treated with iv Vancomycin at [facility] and left AMA . Patient a/o x 4 respirations even and non labored admits to substance abuse. Thumb is red warm swollen and discolored at nailbed and tip. Patient  denies numbness or tingling just has pain has full rom

## 2023-01-26 NOTE — ED Notes (Signed)
Surgeon at bedside. Consent obtained and at pt bedside

## 2023-01-26 NOTE — Transfer of Care (Signed)
Immediate Anesthesia Transfer of Care Note  Patient: Cassandra Wall  Procedure(s) Performed: IRRIGATION AND DEBRIDEMENT OF THUMB (Right: Finger)  Patient Location: PACU  Anesthesia Type:General  Level of Consciousness: drowsy and patient cooperative  Airway & Oxygen Therapy: Patient Spontanous Breathing  Post-op Assessment: Report given to RN and Post -op Vital signs reviewed and stable  Post vital signs: Reviewed and stable  Last Vitals:  Vitals Value Taken Time  BP 119/74 01/26/23 1609  Temp    Pulse 88 01/26/23 1612  Resp 16 01/26/23 1612  SpO2 90 % 01/26/23 1612  Vitals shown include unfiled device data.  Last Pain:  Vitals:   01/26/23 1204  TempSrc: Oral  PainSc:          Complications: No notable events documented.

## 2023-01-26 NOTE — Anesthesia Postprocedure Evaluation (Signed)
Anesthesia Post Note  Patient: Cassandra Wall  Procedure(s) Performed: IRRIGATION AND DEBRIDEMENT OF THUMB (Right: Finger)     Patient location during evaluation: PACU Anesthesia Type: General Level of consciousness: awake and alert Pain management: pain level controlled Vital Signs Assessment: post-procedure vital signs reviewed and stable Respiratory status: spontaneous breathing, nonlabored ventilation and respiratory function stable Cardiovascular status: blood pressure returned to baseline and stable Postop Assessment: no apparent nausea or vomiting Anesthetic complications: no   No notable events documented.  Last Vitals:  Vitals:   01/26/23 1645 01/26/23 1713  BP: (!) 105/58 111/68  Pulse: 80 80  Resp: 17 18  Temp: 36.7 C 36.7 C  SpO2: 99% 99%    Last Pain:  Vitals:   01/26/23 1713  TempSrc: Oral  PainSc:                  Joden Bonsall

## 2023-01-27 ENCOUNTER — Observation Stay (HOSPITAL_BASED_OUTPATIENT_CLINIC_OR_DEPARTMENT_OTHER): Payer: Medicaid Other

## 2023-01-27 ENCOUNTER — Encounter (HOSPITAL_COMMUNITY): Payer: Self-pay | Admitting: General Surgery

## 2023-01-27 DIAGNOSIS — M86241 Subacute osteomyelitis, right hand: Secondary | ICD-10-CM | POA: Diagnosis not present

## 2023-01-27 DIAGNOSIS — M7989 Other specified soft tissue disorders: Secondary | ICD-10-CM | POA: Diagnosis not present

## 2023-01-27 DIAGNOSIS — Z91040 Latex allergy status: Secondary | ICD-10-CM | POA: Diagnosis not present

## 2023-01-27 DIAGNOSIS — B9562 Methicillin resistant Staphylococcus aureus infection as the cause of diseases classified elsewhere: Secondary | ICD-10-CM | POA: Diagnosis present

## 2023-01-27 DIAGNOSIS — F1290 Cannabis use, unspecified, uncomplicated: Secondary | ICD-10-CM | POA: Diagnosis not present

## 2023-01-27 DIAGNOSIS — I361 Nonrheumatic tricuspid (valve) insufficiency: Secondary | ICD-10-CM

## 2023-01-27 DIAGNOSIS — Z813 Family history of other psychoactive substance abuse and dependence: Secondary | ICD-10-CM | POA: Diagnosis not present

## 2023-01-27 DIAGNOSIS — F1390 Sedative, hypnotic, or anxiolytic use, unspecified, uncomplicated: Secondary | ICD-10-CM | POA: Diagnosis not present

## 2023-01-27 DIAGNOSIS — G4700 Insomnia, unspecified: Secondary | ICD-10-CM | POA: Diagnosis not present

## 2023-01-27 DIAGNOSIS — G40909 Epilepsy, unspecified, not intractable, without status epilepticus: Secondary | ICD-10-CM | POA: Diagnosis not present

## 2023-01-27 DIAGNOSIS — R9431 Abnormal electrocardiogram [ECG] [EKG]: Secondary | ICD-10-CM | POA: Diagnosis present

## 2023-01-27 DIAGNOSIS — M869 Osteomyelitis, unspecified: Secondary | ICD-10-CM | POA: Diagnosis not present

## 2023-01-27 DIAGNOSIS — L03011 Cellulitis of right finger: Secondary | ICD-10-CM | POA: Diagnosis not present

## 2023-01-27 DIAGNOSIS — I69354 Hemiplegia and hemiparesis following cerebral infarction affecting left non-dominant side: Secondary | ICD-10-CM | POA: Diagnosis not present

## 2023-01-27 DIAGNOSIS — Z79899 Other long term (current) drug therapy: Secondary | ICD-10-CM | POA: Diagnosis not present

## 2023-01-27 DIAGNOSIS — Z881 Allergy status to other antibiotic agents status: Secondary | ICD-10-CM | POA: Diagnosis not present

## 2023-01-27 DIAGNOSIS — Z8619 Personal history of other infectious and parasitic diseases: Secondary | ICD-10-CM | POA: Diagnosis not present

## 2023-01-27 DIAGNOSIS — I071 Rheumatic tricuspid insufficiency: Secondary | ICD-10-CM | POA: Diagnosis not present

## 2023-01-27 DIAGNOSIS — F319 Bipolar disorder, unspecified: Secondary | ICD-10-CM | POA: Diagnosis not present

## 2023-01-27 DIAGNOSIS — F1490 Cocaine use, unspecified, uncomplicated: Secondary | ICD-10-CM | POA: Diagnosis present

## 2023-01-27 DIAGNOSIS — F17210 Nicotine dependence, cigarettes, uncomplicated: Secondary | ICD-10-CM | POA: Diagnosis not present

## 2023-01-27 DIAGNOSIS — F419 Anxiety disorder, unspecified: Secondary | ICD-10-CM | POA: Diagnosis not present

## 2023-01-27 LAB — URINALYSIS, W/ REFLEX TO CULTURE (INFECTION SUSPECTED)
Bilirubin Urine: NEGATIVE
Glucose, UA: NEGATIVE mg/dL
Ketones, ur: NEGATIVE mg/dL
Leukocytes,Ua: NEGATIVE
Nitrite: NEGATIVE
Protein, ur: 100 mg/dL — AB
Specific Gravity, Urine: 1.029 (ref 1.005–1.030)
pH: 5 (ref 5.0–8.0)

## 2023-01-27 LAB — COMPREHENSIVE METABOLIC PANEL
ALT: 14 U/L (ref 0–44)
AST: 16 U/L (ref 15–41)
Albumin: 3.2 g/dL — ABNORMAL LOW (ref 3.5–5.0)
Alkaline Phosphatase: 69 U/L (ref 38–126)
Anion gap: 11 (ref 5–15)
BUN: 19 mg/dL (ref 6–20)
CO2: 24 mmol/L (ref 22–32)
Calcium: 8.9 mg/dL (ref 8.9–10.3)
Chloride: 102 mmol/L (ref 98–111)
Creatinine, Ser: 1.24 mg/dL — ABNORMAL HIGH (ref 0.44–1.00)
GFR, Estimated: 56 mL/min — ABNORMAL LOW (ref >60)
Glucose, Bld: 91 mg/dL (ref 70–99)
Potassium: 4.2 mmol/L (ref 3.5–5.1)
Sodium: 137 mmol/L (ref 135–145)
Total Bilirubin: 0.2 mg/dL — ABNORMAL LOW (ref 0.3–1.2)
Total Protein: 6 g/dL — ABNORMAL LOW (ref 6.5–8.1)

## 2023-01-27 LAB — RAPID URINE DRUG SCREEN, HOSP PERFORMED
Amphetamines: NOT DETECTED
Barbiturates: NOT DETECTED
Benzodiazepines: POSITIVE — AB
Cocaine: POSITIVE — AB
Opiates: NOT DETECTED
Tetrahydrocannabinol: POSITIVE — AB

## 2023-01-27 LAB — CBC
HCT: 40.3 % (ref 36.0–46.0)
Hemoglobin: 12.7 g/dL (ref 12.0–15.0)
MCH: 28.9 pg (ref 26.0–34.0)
MCHC: 31.5 g/dL (ref 30.0–36.0)
MCV: 91.8 fL (ref 80.0–100.0)
Platelets: 170 10*3/uL (ref 150–400)
RBC: 4.39 MIL/uL (ref 3.87–5.11)
RDW: 13.7 % (ref 11.5–15.5)
WBC: 8.3 10*3/uL (ref 4.0–10.5)
nRBC: 0 % (ref 0.0–0.2)

## 2023-01-27 LAB — ECHOCARDIOGRAM COMPLETE
Calc EF: 60.5 %
Height: 68 in
P 1/2 time: 499 msec
S' Lateral: 2.9 cm
Single Plane A2C EF: 64.8 %
Single Plane A4C EF: 54.3 %
Weight: 3120 oz

## 2023-01-27 MED ORDER — KETOROLAC TROMETHAMINE 15 MG/ML IJ SOLN: 15.0000 mg | Freq: Once | INTRAMUSCULAR | Status: DC | PRN

## 2023-01-27 MED ORDER — DIPHENHYDRAMINE HCL 12.5 MG/5ML PO ELIX: 12.5000 mg | ORAL_SOLUTION | Freq: Every day | ORAL | Status: DC | PRN

## 2023-01-27 MED ORDER — ACETAMINOPHEN 325 MG PO TABS: 650.0000 mg | ORAL_TABLET | Freq: Four times a day (QID) | ORAL | Status: DC | PRN

## 2023-01-27 MED ORDER — ACETAMINOPHEN 650 MG RE SUPP: 650.0000 mg | Freq: Four times a day (QID) | RECTAL | Status: DC | PRN

## 2023-01-27 MED ORDER — VANCOMYCIN HCL 1500 MG/300ML IV SOLN
1500.0000 mg | INTRAVENOUS | Status: DC
  Administered 2023-01-28: 1500 mg via INTRAVENOUS

## 2023-01-27 MED ORDER — PROPRANOLOL HCL 10 MG PO TABS
10.0000 mg | ORAL_TABLET | Freq: Every day | ORAL | Status: DC
  Administered 2023-01-27 – 2023-01-28 (×2): 10 mg via ORAL
  Filled 2023-01-27 (×3): qty 1

## 2023-01-27 NOTE — Progress Notes (Signed)
Pharmacy Antibiotic Note  Cassandra Wall is a 42 y.o. female for which pharmacy has been consulted for vancomycin dosing for  osteomyelitis .  Patient with a history of cocaine use, prior history of IVDU w/ endocarditis 7 years ago. Patient denies any active IVDU. Patient presenting with worsening thumb pain and swelling on the right thumb secondary to metal or glass being stuck there.  Notable increase in SCr 0.96 > 1.24 WBC 8.3; T 98.4; HR 81; RR 18  Plan: Current dose with eAUC 643, trough of 22. Will change from Vanc 750 mg q8h to Vancomycin 1500 mg q24h, with eAUC of 503.  Will start this new dose 18h after last dose of 750 mg.  Monitor WBC, fever, renal function, cultures De-escalate when able Levels at steady state of new regimen if patient remains on Vancomycin -- noted plans to consider oral transition tomorrow.  Height: 5\' 8"  (172.7 cm) Weight: 88.5 kg (195 lb) IBW/kg (Calculated) : 63.9  Temp (24hrs), Avg:97.8 F (36.6 C), Min:97 F (36.1 C), Max:98.4 F (36.9 C)  Recent Labs  Lab 01/24/23 2115 01/26/23 0622 01/26/23 0636 01/27/23 0228 01/27/23 0613  WBC 6.7 8.0  --   --  8.3  CREATININE 0.68 0.96  --  1.24*  --   LATICACIDVEN  --   --  0.7  --   --     Estimated Creatinine Clearance: 70.2 mL/min (A) (by C-G formula based on SCr of 1.24 mg/dL (H)).    Allergies  Allergen Reactions   Gluten Meal Anaphylaxis   Wheat Anaphylaxis   Cipro [Ciprofloxacin Hcl] Other (See Comments)    Reports as resistant   Latex Hives, Itching and Rash   Microbiology results: 7/20 Wcx: moderate growth Staphylococcus Aureus. Susceptibilities pending.  Thank you for allowing pharmacy to be a part of this patient's care.  Dalene Carrow, PharmD PGY2 Infectious Diseases Pharmacy Resident 01/27/2023 2:38 PM

## 2023-01-27 NOTE — Progress Notes (Signed)
Summary: Patient is a 42 y.o. 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 Patient with a 16 pack-year smoking history.   Subjective:  No overnight events. Patient reports she is feeling better this morning, pain in R thumbnail/thumb is improved after I & D. She is reporting itch around site. Denies fever, chills, N/V, abdominal pain, CP, headache, or acute pain elsewhere. She is alert and oriented. Patient asked about anxiety/bedtime medications, explained that her EKG showed QT prolongation and we would be holding them until at least tomorrow. Patient expressed understanding. When discussing plan, patient stated she was not aware of amputation as a possibility if infection did not improve with I & D and IV antibiotics. Explained tentative plan to continue IV vancomycin, monitor labs and fever, and follow up the blood culture, and patient agreeable.   Objective:  Vital signs in last 24 hours: Vitals:   01/26/23 1713 01/26/23 1931 01/27/23 0016 01/27/23 0453  BP: 111/68 (!) 99/52 107/60 104/67  Pulse: 80 85 87 81  Resp: 18 17 17 16   Temp: 98 F (36.7 C) 97.8 F (36.6 C)    TempSrc: Oral Oral    SpO2: 99% 100% 98% 98%  Weight:      Height:          Latest Ref Rng & Units 01/27/2023    6:13 AM 01/26/2023    6:22 AM 01/24/2023    9:15 PM  CBC  WBC 4.0 - 10.5 K/uL 8.3  8.0  6.7   Hemoglobin 12.0 - 15.0 g/dL 84.1  32.4  40.1   Hematocrit 36.0 - 46.0 % 40.3  47.5  44.0   Platelets 150 - 400 K/uL 170  224  201       Latest Ref Rng & Units 01/27/2023    2:28 AM 01/26/2023    6:22 AM 01/24/2023    9:15 PM  CMP  Glucose 70 - 99 mg/dL 91  97  90   BUN 6 - 20 mg/dL 19  15  17    Creatinine 0.44 - 1.00 mg/dL 0.27  2.53  6.64    Sodium 135 - 145 mmol/L 137  141  136   Potassium 3.5 - 5.1 mmol/L 4.2  3.6  3.9   Chloride 98 - 111 mmol/L 102  104  102   CO2 22 - 32 mmol/L 24  26  24    Calcium 8.9 - 10.3 mg/dL 8.9  9.8  9.1   Total Protein 6.5 - 8.1 g/dL 6.0  7.6    Total Bilirubin 0.3 - 1.2 mg/dL 0.2  0.6    Alkaline Phos 38 - 126 U/L 69  81    AST 15 - 41 U/L 16  20    ALT 0 - 44 U/L 14  16     Blood cultures: no growth in 1 day UA: hazy, protein, bacteria  UDS: positive for cocaine, benzodiazepines, and THC  Physical Exam Constitutional: Patient sitting up in bed, previously on phone with mother. She is able to converse appropriately. Prior notes noting that her restlessness seems to be her baseline.   CV: Regular rate and rhythm without murmurs on auscultation. No LE edema. Intact pulses.  Pulmonary/Respiratory: Clear lungs bilaterally, intermittent cough. In no  acute respiratory distress.  Abdominal: Soft, non-tender, and non-distended. Skin: Warm and dry Psych: Normal mood and affect.  Skin: Right thumb wrapped in bandage, no bleeding, swelling, erythema or drainage noted.   Assessment/Plan:  Principal Problem:   Osteomyelitis (HCC) Assessment & Plan by Problem: Principal Problem:   Osteomyelitis (HCC) Felon of R thumb  Patient is a 42 y.o. with a significant past medical history that includes endocarditis in 2017 resulting in stroke and seizure, who presented to the ED with a chief complaint of R thumb infection and admitted for infection of the right thumb with presumed osteomyelitis.    Patient continues to deny systemic symptoms like fever, chills, N/V, diarrhea. Intermittently tachycardic. I & D 7/21 followed PO doxy for 10 days and 1 dose of IV Vancomycin. I & D of right thumb with debridement of skin, subcutaneous tissue, and bone, performed local tissue advancement flap closure of wound. Cultures of wound obtained, pending. Sterile dressing applied. Dr. Izora Ribas mentioned potential for further  treatment, including amputation. Will follow-up when we are able to trend vitals, labs, and follow up on bone cultures.    Due to history of endocarditis and subsequent CVA/seizure disorder in 2017, ordered echocardiogram on admission to rule out any valvular vegetations.    No known history of alcohol withdrawal, last drink 48 hrs ago. cocaine, benzodiazepines, and THC.   Plan:  - Continue IV Vancomycin 750 mg IV q8H  - Tylenol 650 mg q6H scheduled for pain  - Oxy 5 q6H PRN for severe pain  - Echocardiogram ordered, f/u - Follow-up BC - Daily labs - Telemetry  - Diphenhydramine 12.5 mg for pruritus PRN (discussed slight, if any, effect on QT prolongation with Dr. Criselda Peaches)   Chronic disorders History of endocarditis, stroke - Continue Metoprolol 50 mg BID   Seizure disorder - Continue levetiracetam/Keppra 500 mg BID    Anxiety Bipolar Disorder Depression - Holding some home medications including Mirtazapine 15 mg at bedtime, Quetiapine 50 mg BID at bedtime, Prazosin 3 mg at bedtime, or hydroxyzine 25 mg 4x a day for 5 days due to Qtc 495 QT prolongation.  - Continue Propranolol 10 mg daily. - Patient's home medications include clonazepam 1 mg TID, will f/u with patient if taking.   Prior to Admission Living Arrangement: Home Anticipated Discharge Location: Home Barriers to Discharge: IV antibiotic treatment, medical management  Dispo: Anticipated discharge in approximately ?? day(s).   Philomena Doheny, MD, PGY-1 01/27/2023, 6:42 AM Pager: [phone] After 5pm on weekdays and 1pm on weekends: On Call pager [phone]

## 2023-01-27 NOTE — Plan of Care (Signed)

## 2023-01-27 NOTE — Progress Notes (Signed)
Pt to leave dressing in place, keep clean and dry; if dressing becomes wet, please rewrap with sterile dressing.  Pt to follow up in office, please call for an appointment. Continue oral antibiotics as per medical team.

## 2023-01-27 NOTE — Progress Notes (Signed)
Lab called. Phlebotomy will re-collect CBC.  Previous sample was clotted.

## 2023-01-27 NOTE — Progress Notes (Signed)
  Echocardiogram 2D Echocardiogram has been performed.  Cassandra Wall 01/27/2023, 2:15 PM

## 2023-01-27 NOTE — Plan of Care (Signed)
  Problem: Activity: Goal: Risk for activity intolerance will decrease Outcome: Progressing   Problem: Pain Managment: Goal: General experience of comfort will improve Outcome: Progressing   

## 2023-01-28 ENCOUNTER — Other Ambulatory Visit (HOSPITAL_COMMUNITY): Payer: Self-pay

## 2023-01-28 DIAGNOSIS — M86241 Subacute osteomyelitis, right hand: Secondary | ICD-10-CM

## 2023-01-28 LAB — CBC
HCT: 41.3 % (ref 36.0–46.0)
Hemoglobin: 13 g/dL (ref 12.0–15.0)
MCH: 28.6 pg (ref 26.0–34.0)
MCHC: 31.5 g/dL (ref 30.0–36.0)
MCV: 90.8 fL (ref 80.0–100.0)
Platelets: 158 10*3/uL (ref 150–400)
RBC: 4.55 MIL/uL (ref 3.87–5.11)
RDW: 13.4 % (ref 11.5–15.5)
WBC: 6.8 10*3/uL (ref 4.0–10.5)
nRBC: 0 % (ref 0.0–0.2)

## 2023-01-28 LAB — CULTURE, BLOOD (ROUTINE X 2): Special Requests: ADEQUATE

## 2023-01-28 LAB — COMPREHENSIVE METABOLIC PANEL
ALT: 13 U/L (ref 0–44)
AST: 15 U/L (ref 15–41)
Albumin: 3.1 g/dL — ABNORMAL LOW (ref 3.5–5.0)
Alkaline Phosphatase: 75 U/L (ref 38–126)
Anion gap: 6 (ref 5–15)
BUN: 25 mg/dL — ABNORMAL HIGH (ref 6–20)
CO2: 24 mmol/L (ref 22–32)
Calcium: 8.3 mg/dL — ABNORMAL LOW (ref 8.9–10.3)
Chloride: 106 mmol/L (ref 98–111)
Creatinine, Ser: 0.83 mg/dL (ref 0.44–1.00)
GFR, Estimated: >60 mL/min (ref >60)
Glucose, Bld: 107 mg/dL — ABNORMAL HIGH (ref 70–99)
Potassium: 4.1 mmol/L (ref 3.5–5.1)
Sodium: 136 mmol/L (ref 135–145)
Total Bilirubin: 0.4 mg/dL (ref 0.3–1.2)
Total Protein: 5.8 g/dL — ABNORMAL LOW (ref 6.5–8.1)

## 2023-01-28 MED ORDER — ACETAMINOPHEN 325 MG PO TABS
1000.0000 mg | ORAL_TABLET | Freq: Three times a day (TID) | ORAL | 0 refills | Status: AC
  Filled 2023-01-28: qty 100, 11d supply, fill #0

## 2023-01-28 MED ORDER — DOXYCYCLINE HYCLATE 100 MG PO TABS
100.0000 mg | ORAL_TABLET | Freq: Two times a day (BID) | ORAL | Status: DC
  Administered 2023-01-28: 100 mg via ORAL
  Filled 2023-01-28: qty 1

## 2023-01-28 MED ORDER — OXYCODONE HCL 5 MG PO TABS
5.0000 mg | ORAL_TABLET | Freq: Four times a day (QID) | ORAL | 0 refills | Status: AC | PRN
  Filled 2023-01-28: qty 12, 3d supply, fill #0

## 2023-01-28 MED ORDER — IBUPROFEN 200 MG PO TABS
600.0000 mg | ORAL_TABLET | Freq: Four times a day (QID) | ORAL | 0 refills | Status: AC | PRN
  Filled 2023-01-28: qty 100, 9d supply, fill #0

## 2023-01-28 MED ORDER — DOXYCYCLINE HYCLATE 100 MG PO TABS
100.0000 mg | ORAL_TABLET | Freq: Two times a day (BID) | ORAL | 0 refills | Status: DC
  Filled 2023-01-28: qty 56, 28d supply, fill #0

## 2023-01-28 MED ORDER — KETOROLAC TROMETHAMINE 15 MG/ML IJ SOLN
15.0000 mg | Freq: Once | INTRAMUSCULAR | Status: AC
  Administered 2023-01-28: 15 mg via INTRAVENOUS
  Filled 2023-01-28: qty 1

## 2023-01-28 MED ORDER — DOXYCYCLINE HYCLATE 100 MG PO TABS
100.0000 mg | ORAL_TABLET | Freq: Two times a day (BID) | ORAL | 0 refills | Status: AC
  Filled 2023-01-28: qty 57, 29d supply, fill #0

## 2023-01-28 NOTE — Plan of Care (Signed)
Discharge instructions discussed with patient.  Patient instructed on home medications, restrictions, and follow up appointments. Belongings gathered and sent with patient.  Patients medications brought to the bedside  Patient discharged walked with patient to ED to car

## 2023-01-28 NOTE — Discharge Summary (Signed)
Name: Cassandra Wall MRN: [id number] DOB: [birth year] 42 y.o. PCP: Jackie Plum, MD  Date of Admission: 01/26/2023  5:36 AM Date of Discharge: 01/28/2023 9:43 PM Attending Physician: Dr. Antony Contras  Discharge Diagnosis: Principal Problem:   Osteomyelitis [facility])    Discharge Medications: Allergies as of 01/28/2023       Reactions   Gluten Meal Anaphylaxis   Wheat Anaphylaxis   Cipro [ciprofloxacin Hcl] Other (See Comments)   Reports as resistant   Latex Hives, Itching, Rash        Medication List     STOP taking these medications    doxycycline 100 MG capsule Commonly known as: VIBRAMYCIN Replaced by: doxycycline 100 MG tablet   EPINEPHrine 0.3 mg/0.3 mL Soaj injection Commonly known as: EPI-PEN   hydrOXYzine 25 MG tablet Commonly known as: ATARAX   mirtazapine 15 MG tablet Commonly known as: Remeron   omeprazole 40 MG capsule Commonly known as: PRILOSEC   prazosin 1 MG capsule Commonly known as: MINIPRESS       TAKE these medications    acetaminophen 325 MG tablet Commonly known as: TYLENOL Take 3 tablets (975 mg total) by mouth every 8 (eight) hours for 7 days.   doxycycline 100 MG tablet Commonly known as: VIBRA-TABS Take 1 tablet (100 mg total) by mouth every 12 (twelve) hours. Replaces: doxycycline 100 MG capsule   ibuprofen 200 MG tablet Commonly known as: Advil Take 3 tablets (600 mg total) by mouth every 6 (six) hours as needed for up to 5 days. What changed:  when to take this reasons to take this   levETIRAcetam 500 MG tablet Commonly known as: KEPPRA Take 1 tablet (500 mg total) by mouth 2 (two) times daily.   oxyCODONE 5 MG immediate release tablet Commonly known as: Oxy IR/ROXICODONE Take 1 tablet (5 mg total) by mouth every 6 (six) hours as needed for up to 3 days for severe pain.   QUEtiapine 300 MG 24 hr tablet Commonly known as: SEROQUEL XR Take 300 mg by mouth at bedtime.               Discharge Care  Instructions  (From admission, onward)           Start     Ordered   01/28/23 0000  Discharge wound care:       Comments: Please keep the sterile dressing on, please keep clean and dry. If sterile dressing becomes wet, please rewrap with sterile dressing. Please call Dr. Izora Ribas [phone] or [phone]) to make an appointment to follow up with wound care and next steps.   01/28/23 1342            Disposition and follow-up:   [name] was discharged from [facility] in Stable condition.  At the hospital follow up visit please address:  1.  Follow-up:  Hand Surgeon follow-up - Dr. Gerhard Perches was consulted to see this patient while she was admitted and performed an irrigation and debridement procedure on her right thumb, which appeared erythematous, swollen, and with a central eschar denoting some necrosis. The wound culture grew staph aureus, and doxycycline was chosen based on culture susceptibilities and oral administration. Dr. Izora Ribas instructed for the patient to make an appointment at his office for follow-up on wound care and next steps. Please ensure patient has this follow up appointment scheduled [facility] [address] )   Antibiotic therapy 4 weeks - Patient previously on 10  day dose of doxycycline BID but unsure if it was completed as prescribed or if it was effective given the presentation of the wound. After I & D and culture susceptibilities, pharmacy helped select doxycycline as the antibiotic of choice. Given this patient's history of endocarditis, recommended therapy is 4 weeks of doxycyline 100 BID. Please ensure the patient understands how to take this mediation appropriately, has the medication available, finishes the entire course, and is still on a birth control method.    Pain management  - Patient discharged on scheduled Tylenol (7 days), PRN ibuprofen (5 days), and a very short course of  oxycodone/roxicodone 5 mg for SEVERE pain PRN q6H. Please address any pain needs, taking into consideration patient's substance use history. Patient explained she has not used illicit IV substances or opioids for many years.     Metoprolol, Propranolol  - Patient's chart showing historical prescription for metoprolol 50 mg BID as well as propranolol 10 mg daily. Please follow up to determine if patient is taking both or just the propranolol and appropriate dosing.   Anxiety/sleep medications - Patient's EKG showing QTc prolongation 494. We only resumed quetiapine/seroquel but told her to hold the mirtazapine, prazosin, and hydroxyzine until she could get a repeat EKG or alternative medications could be explored at her follow-up visit.     Neurology follow-up for seizure disorder - Patient reported she sees her neurologist 1-2 times a year. She continues to take Keppra daily. Please follow-up with patient that she is getting regular follow up.   2.  Labs / imaging needed at time of follow-up: EKG, CBC/WBC  3.  Pending labs/ test needing follow-up: None  4.  Medication Changes  STOPPED  -Mirtazapine  -Prazosin  -Hydroxyzine -Omeprazole (patient indicated not taking, follow up)    ADDED  -Doxycyline   -Tylenol  -Ibuprofen  -Oxycodone/roxicodone short course   MODIFIED    Follow-up Appointments:  Follow-up Information     Knute Neu, MD. Call.   Specialty: General Surgery Contact information: [address]  [address] [place] [address] [phone]                 Hospital Course by problem list: Summary: Patient is a 42 y.o. 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   Assessment & Plan by Problem: Principal Problem: Osteomyelitis (HCC) Felon of R thumb   Per patient, she was  cleaning a grill with a metal bristle brush 3 weeks ago and some of the bristles became stuck in thumb, which she was able to pull out to the best of her ability. She went to [facility] urgent care shortly after when her thumb became inflamed and was given a 10-day course of doxy BID, which she completed. The redness and swelling worsened despite oral antibiotics so patient returned to urgent care, who documented erythema and an eschar on distal tip of R thumb. They recommended further evaluation and management in the emergency room at [facility], where the patient was given one dose of IV vancomycin and evaluated by a hand surgeon (Dr. Kerry Fort) with a plan to admit patient to continue IV antibiotics. Patient left AMA to go pick up her son with a plan to return to [facility] ED for admission.   On 7/20 admission, patient denied systemic symptoms of infection like fever, chills,  N/V, diarrhea. She was intermittently tachycardic. DG R thumb with findings suspicious for osteomyelitis. Patient re-started on IV Vancomycin on admission. Consulted hand surgeon on call, Dr. Izora Ribas, who did not recommend MRI. Dr. Izora Ribas saw patient in person (please see note), explained options including continued antibiotic therapy only, I & D, and possible amputation of distal thumb. Patient was consented for I & D. BC from 7/18 no growth (left antecubital).    EKG on admission in sinus rhythm with borderline prolonged QT. Systolic murmur c/w tricuspid regurgitation noted on exam. Infection in R thumb is not likely due to septic emboli. Due to history of endocarditis and subsequent CVA/seizure disorder in 2017, obtained an echocardiogram (see findings below) which ruled out any valvular vegetations.    Patient's last drink 48 hrs before admission. Denied symptoms associated with cocaine withdrawal (last use 48 hours ago). Monitored patient without CIWA protocol in place. UDS positive for cocaine, benzodiazepines, and THC.    Patient's R thumb was wrapped in sterile dressing after I & D. Patient only complained of burning pain and itch, which improved with Tylenol PRN, Oxy 5 PRN, and benadryl. Wound culture from 7/20 showing staph aureus. Dr. Izora Ribas was ok with patient continuing on PO antibiotic and outpatient follow up with him regarding wound care and any next steps in treatment. Culture susceptibilities made doxycycline an appropriate option. Patient reported she was on depo provera for birth control and willing to do extended antibiotic treatment (4 weeks) due to history of endocarditis.    Chronic disorders History of endocarditis, stroke Follow up on use of any antihypertensive agents like metoprolol and/or propranolol in the outpatient setting.    Seizure disorder Continued levetiracetam/Keppra 500 mg BID.    Anxiety Bipolar Disorder Depression Did not continue patient on home medications including Mirtazapine 15 mg at bedtime, Quetiapine 50 mg BID at bedtime, Prazosin 3 mg at bedtime, or Hydroxyzine 25 mg 4x a day due to concerns for prolonged QT on EKG. Patient resumed propranolol 10 on day 1 of hospitalization, and seroquel on day 2. Continued to hold mirtazapine, quetiapine, prazosin, and hydroxyzine until patient can discuss with PCP.     Discharge Subjective: Patient complaining of a burning pain sensation in her R thumb. Reminded of PRN pain management, patient reports she does not want to request too much of the oxy/roxi for severe pain given her history, but thumb is really bothering her. Itching improved with benadryl. Patient concerned about not getting her sleep medications and reports poor sleep. Denies CP, SOB, chills, fever, N/V, or abdominal pain. Is using the bathroom without an problems. Reminded that some medications are going to continue to be held because of their effects on QT prolongation and that she can follow-up with her PCP about it. Told she would have to make an appointment with her  PCP and Dr. Izora Ribas.   Discharge Exam:   Blood pressure 112/63, pulse 77, temperature 97.7 F (36.5 C), resp. rate 18, height 5\' 8"  (1.727 m), weight 88.5 kg, last menstrual period 01/10/2023, SpO2 97%.  Constitutional:Well-appearing patient sitting in bed, appears restless but is conversing appropriately HENT: normocephalic atraumatic, mucous membranes moist Cardiovascular: regular rate and rhythm, systolic murmur RUSB, no rubs or gallops Pulmonary/Chest: normal work of breathing on room air, lungs clear to auscultation bilaterally. Abdominal: soft, non-tender, non-distended.  Neurological: alert & oriented x 3 MSK: right thumb wrapped in sterile dressing. No edema or erythema on areas around the dressing. Radial pulse intact. No pain with passive motion  of right upper extremity. Sensation intact.   Skin: warm and dry Psych: slightly anxious, able to hold her attention on the conversation despite constantly moving about while sitting on the bed  Pertinent Labs, Studies, and Procedures:     Latest Ref Rng & Units 01/28/2023    4:02 AM 01/27/2023    6:13 AM 01/26/2023    6:22 AM  CBC  WBC 4.0 - 10.5 K/uL 6.8  8.3  8.0   Hemoglobin 12.0 - 15.0 g/dL 16.1  09.6  04.5   Hematocrit 36.0 - 46.0 % 41.3  40.3  47.5   Platelets 150 - 400 K/uL 158  170  224        Latest Ref Rng & Units 01/28/2023    4:02 AM 01/27/2023    2:28 AM 01/26/2023    6:22 AM  CMP  Glucose 70 - 99 mg/dL 409  91  97   BUN 6 - 20 mg/dL 25  19  15    Creatinine 0.44 - 1.00 mg/dL 8.11  9.14  7.82   Sodium 135 - 145 mmol/L 136  137  141   Potassium 3.5 - 5.1 mmol/L 4.1  4.2  3.6   Chloride 98 - 111 mmol/L 106  102  104   CO2 22 - 32 mmol/L 24  24  26    Calcium 8.9 - 10.3 mg/dL 8.3  8.9  9.8   Total Protein 6.5 - 8.1 g/dL 5.8  6.0  7.6   Total Bilirubin 0.3 - 1.2 mg/dL 0.4  0.2  0.6   Alkaline Phos 38 - 126 U/L 75  69  81   AST 15 - 41 U/L 15  16  20    ALT 0 - 44 U/L 13  14  16      ECHOCARDIOGRAM COMPLETE  Result  Date: 01/27/2023    ECHOCARDIOGRAM REPORT   Patient Name:   KASSANDRA MERIWEATHER Date of Exam: 01/27/2023 Medical Rec #:  [id number]         Height:       68.0 in Accession #:    [id number]        Weight:       195.0 lb Date of Birth:  01/18/1981         BSA:          2.022 m Patient Age:    41 years          BP:           119/58 mmHg Patient Gender: F                 HR:           83 bpm. Exam Location:  Inpatient Procedure: 2D Echo, Cardiac Doppler and Color Doppler Indications:    Murmur  History:        Patient has prior history of Echocardiogram examinations, most                 recent 12/26/2015. Myocarditis, Endocarditis; Risk                 Factors:Current Smoker and ETOH.  Sonographer:    Milda Smart Referring Phys: [id number] EMILY B MULLEN IMPRESSIONS  1. No prior echo to compare.  2. Left ventricular ejection fraction, by estimation, is 55 to 60%. The left ventricle has normal function. The left ventricle has no regional wall motion abnormalities.  3. Right ventricular systolic function is mildly reduced. The right ventricular size is mildly  enlarged.  4. Right atrial size was mildly dilated.  5. No evidence of mitral valve regurgitation.  6. Tricuspid valve regurgitation is moderate to severe.  7. The aortic valve is tricuspid. Aortic valve regurgitation is moderate.  8. The inferior vena cava is dilated in size with <50% respiratory variability, suggesting right atrial pressure of 15 mmHg. FINDINGS  Left Ventricle: Left ventricular ejection fraction, by estimation, is 55 to 60%. The left ventricle has normal function. The left ventricle has no regional wall motion abnormalities. The left ventricular internal cavity size was normal in size. There is  no left ventricular hypertrophy. Right Ventricle: The right ventricular size is mildly enlarged. Right vetricular wall thickness was not assessed. Right ventricular systolic function is mildly reduced. Left Atrium: Left atrial size was normal in size. Right  Atrium: Right atrial size was mildly dilated. Pericardium: There is no evidence of pericardial effusion. Mitral Valve: There is mild thickening of the mitral valve leaflet(s). No evidence of mitral valve regurgitation. Tricuspid Valve: The tricuspid valve is normal in structure. Tricuspid valve regurgitation is moderate to severe. Aortic Valve: The aortic valve is tricuspid. Aortic valve regurgitation is moderate. Aortic regurgitation PHT measures 499 msec. Pulmonic Valve: The pulmonic valve was grossly normal. Pulmonic valve regurgitation is mild. Aorta: The aortic root and ascending aorta are structurally normal, with no evidence of dilitation. Venous: The inferior vena cava is dilated in size with less than 50% respiratory variability, suggesting right atrial pressure of 15 mmHg. IAS/Shunts: No atrial level shunt detected by color flow Doppler.  LEFT VENTRICLE PLAX 2D LVIDd:         4.00 cm     Diastology LVIDs:         2.90 cm     LV e' medial:  7.83 cm/s LV PW:         0.90 cm     LV e' lateral: 11.10 cm/s LV IVS:        0.90 cm LVOT diam:     2.00 cm LV SV:         68 LV SV Index:   34 LVOT Area:     3.14 cm  LV Volumes (MOD) LV vol d, MOD A2C: 75.2 ml LV vol d, MOD A4C: 54.0 ml LV vol s, MOD A2C: 26.5 ml LV vol s, MOD A4C: 24.7 ml LV SV MOD A2C:     48.7 ml LV SV MOD A4C:     54.0 ml LV SV MOD BP:      39.1 ml RIGHT VENTRICLE             IVC RV Basal diam:  4.10 cm     IVC diam: 2.30 cm RV Mid diam:    3.60 cm RV S prime:     16.30 cm/s TAPSE (M-mode): 3.1 cm LEFT ATRIUM             Index        RIGHT ATRIUM           Index LA diam:        3.50 cm 1.73 cm/m   RA Area:     20.90 cm LA Vol (A2C):   30.4 ml 15.04 ml/m  RA Volume:   64.00 ml  31.65 ml/m LA Vol (A4C):   27.3 ml 13.50 ml/m LA Biplane Vol: 28.3 ml 14.00 ml/m  AORTIC VALVE LVOT Vmax:   102.00 cm/s LVOT Vmean:  66.200 cm/s LVOT VTI:    0.216 m AI  PHT:      499 msec  AORTA Ao Root diam: 3.00 cm Ao Asc diam:  3.10 cm TRICUSPID VALVE TR Peak grad:    33.4 mmHg TR Mean grad:   15.0 mmHg TR Vmax:        289.00 cm/s TR Vmean:       186.0 cm/s  SHUNTS Systemic VTI:  0.22 m Systemic Diam: 2.00 cm Dietrich Pates MD Electronically signed by Dietrich Pates MD Signature Date/Time: 01/27/2023/3:35:36 PM    Final    DG Finger Thumb Right  Result Date: 01/26/2023 CLINICAL DATA:  Infection. EXAM: RIGHT THUMB 2+V COMPARISON:  None Available. FINDINGS: Subtle cortical loss and demineralization identified in the palmar aspect of the distal phalanx towards the base. IMPRESSION: Subtle demineralization towards the base of the distal phalanx. Imaging features highly suspicious for osteomyelitis. Electronically Signed   By: Kennith Center M.D.   On: 01/26/2023 06:58   DG Chest Portable 1 View  Result Date: 01/26/2023 CLINICAL DATA:  Sepsis. EXAM: PORTABLE CHEST 1 VIEW COMPARISON:  10/26/2015 FINDINGS: The lungs are clear without focal pneumonia, edema, pneumothorax or pleural effusion. Cardiopericardial silhouette is at upper limits of normal for size. No acute bony abnormality. IMPRESSION: No active disease. Electronically Signed   By: Kennith Center M.D.   On: 01/26/2023 06:57     Discharge Instructions: Discharge Instructions     Call MD for:  difficulty breathing, headache or visual disturbances   Complete by: As directed    Call MD for:  hives   Complete by: As directed    Call MD for:  persistant dizziness or light-headedness   Complete by: As directed    Call MD for:  persistant nausea and vomiting   Complete by: As directed    Call MD for:  redness, tenderness, or signs of infection (pain, swelling, redness, odor or green/yellow discharge around incision site)   Complete by: As directed    Call MD for:  severe uncontrolled pain   Complete by: As directed    Call MD for:  temperature >100.4   Complete by: As directed    Diet - low sodium heart healthy   Complete by: As directed    Discharge instructions   Complete by: As directed    PATIENT  INSTRUCTIONS:   - You were seen for an infection in your right thumb. Dr. Izora Ribas saw you on admission and performed an incision and drainage (I & D). The wound culture has grown staph aureus, which can be covered by an oral antibiotic called doxycycline. Now that the infection has been cleaned, Dr. Izora Ribas is ok with you going on an oral antibiotic. He said to leave your dressing in place, keep CLEAN and DRY. If the dressing becomes wet, please REWRAP with sterile dressing.   He will see you in his office for a follow-up appointment, but you will need to call to make it:   [facility] [address]  Hours: 8:30-5:00 PM  Please call today to make an appointment at: [phone]  - You will take Doxycycline 100 mg, twice a day, until you have finished all pills. You will need to take your FIRST PILL today in the evening (01/28/23). You should continue taking one pill, two times a day (morning and evening) until February 24, 2023. It is VERY important that you take antibiotic for about 4 weeks for it to be effective, and to prevent your infection from progressing.   -  For pain, we have prescribed you Tylenol 975 mg mg every 8 hours for 7 days and ibuprofen 600 mg every 6 hours as needed for 5 days for mild to moderate pain. For SEVERE pain, we have given you a short course of oxycodone/roxicodone 5 mg that can be taken as needed every 6 hours.   - We paused some of your anxiety and sleep medications because of concern that they could be impacting your heart (your QT segment) on the EKG (picture of the electrical signals in your heart). With our pharmacist team, we would like for you to only continue taking your quetiapine (SEROQUEL).   - You should stop taking your mirtazapine (REMERON), hydroxyzine (ATARAX) and prazosin (MINIPRESS) UNTIL YOU CAN SPEAK WITH YOUR DOCTOR. Your doctor can repeat the EKG and determine if it safe to resume these medications, or to find alternatives.   -  Please continue to take your seizure medication, levetiracetam (KEPPRA).   - Please continue to take your medication, propranolol (INDERAL).  - PLEASE make an appointment with your doctor, Dr. Greggory Stallion Osei-Bonsu. Call [facility] at [phone] to make this appointment for the next 7-10 days to follow up on your medications and to make sure you have an appointment with Dr. Izora Ribas.   - Please call our clinic during office hours (8:00 AM - 5 PM) at [phone] if you have any questions regarding your care. Please call 911 if you are having a medical emergency, including fever, new pain, swelling in your right arm, weakness or lack of pulse or sensation in your right arm.   It was a pleasure serving you during your stay with [place]! - Dr. Justin Mend and team   Discharge wound care:   Complete by: As directed    Please keep the sterile dressing on, please keep clean and dry. If sterile dressing becomes wet, please rewrap with sterile dressing. Please call Dr. Izora Ribas [phone] or [phone] to make an appointment to follow up with wound care and next steps.   Increase activity slowly   Complete by: As directed        Signed: Talma Aguillard Colbert Coyer, MD [facility] - PGY1 Pager: [phone] 01/28/2023, 9:43 PM    Please contact the on call pager after 5 pm and on weekends at [phone].

## 2023-01-29 LAB — CULTURE, BLOOD (ROUTINE X 2)
Culture: NO GROWTH
Culture: NO GROWTH
Special Requests: ADEQUATE

## 2023-01-30 LAB — CULTURE, BLOOD (ROUTINE X 2): Special Requests: ADEQUATE

## 2023-01-31 LAB — CULTURE, BLOOD (ROUTINE X 2): Culture: NO GROWTH

## 2023-01-31 LAB — AEROBIC/ANAEROBIC CULTURE W GRAM STAIN (SURGICAL/DEEP WOUND)

## 2023-05-08 ENCOUNTER — Emergency Department (HOSPITAL_COMMUNITY): Payer: No Typology Code available for payment source

## 2023-05-08 ENCOUNTER — Emergency Department (HOSPITAL_COMMUNITY)
Admission: EM | Admit: 2023-05-08 | Discharge: 2023-05-09 | Disposition: A | Discharge status: Home / Self Care | Payer: No Typology Code available for payment source

## 2023-05-08 ENCOUNTER — Other Ambulatory Visit: Payer: Self-pay

## 2023-05-08 DIAGNOSIS — S0990XA Unspecified injury of head, initial encounter: Secondary | ICD-10-CM | POA: Insufficient documentation

## 2023-05-08 DIAGNOSIS — Y92410 Unspecified street and highway as the place of occurrence of the external cause: Secondary | ICD-10-CM | POA: Insufficient documentation

## 2023-05-08 DIAGNOSIS — S20219A Contusion of unspecified front wall of thorax, initial encounter: Secondary | ICD-10-CM | POA: Insufficient documentation

## 2023-05-08 DIAGNOSIS — Z91040 Latex allergy status: Secondary | ICD-10-CM | POA: Diagnosis not present

## 2023-05-08 DIAGNOSIS — S161XXA Strain of muscle, fascia and tendon at neck level, initial encounter: Secondary | ICD-10-CM | POA: Diagnosis not present

## 2023-05-08 DIAGNOSIS — S199XXA Unspecified injury of neck, initial encounter: Secondary | ICD-10-CM | POA: Diagnosis present

## 2023-05-08 DIAGNOSIS — S3991XA Unspecified injury of abdomen, initial encounter: Secondary | ICD-10-CM | POA: Diagnosis not present

## 2023-05-08 DIAGNOSIS — R079 Chest pain, unspecified: Secondary | ICD-10-CM | POA: Diagnosis not present

## 2023-05-08 LAB — COMPREHENSIVE METABOLIC PANEL
ALT: 14 U/L (ref 0–44)
AST: 22 U/L (ref 15–41)
Albumin: 4 g/dL (ref 3.5–5.0)
Alkaline Phosphatase: 73 U/L (ref 38–126)
Anion gap: 12 (ref 5–15)
BUN: 21 mg/dL — ABNORMAL HIGH (ref 6–20)
CO2: 24 mmol/L (ref 22–32)
Calcium: 9.4 mg/dL (ref 8.9–10.3)
Chloride: 104 mmol/L (ref 98–111)
Creatinine, Ser: 1.21 mg/dL — ABNORMAL HIGH (ref 0.44–1.00)
GFR, Estimated: 58 mL/min — ABNORMAL LOW (ref >60)
Glucose, Bld: 122 mg/dL — ABNORMAL HIGH (ref 70–99)
Potassium: 3.9 mmol/L (ref 3.5–5.1)
Sodium: 140 mmol/L (ref 135–145)
Total Bilirubin: 0.6 mg/dL (ref 0.3–1.2)
Total Protein: 6.7 g/dL (ref 6.5–8.1)

## 2023-05-08 LAB — CBC
HCT: 40.9 % (ref 36.0–46.0)
Hemoglobin: 13 g/dL (ref 12.0–15.0)
MCH: 28.9 pg (ref 26.0–34.0)
MCHC: 31.8 g/dL (ref 30.0–36.0)
MCV: 90.9 fL (ref 80.0–100.0)
Platelets: 183 10*3/uL (ref 150–400)
RBC: 4.5 MIL/uL (ref 3.87–5.11)
RDW: 14 % (ref 11.5–15.5)
WBC: 11 10*3/uL — ABNORMAL HIGH (ref 4.0–10.5)
nRBC: 0 % (ref 0.0–0.2)

## 2023-05-08 LAB — I-STAT CHEM 8, ED
BUN: 24 mg/dL — ABNORMAL HIGH (ref 6–20)
Calcium, Ion: 1.17 mmol/L (ref 1.15–1.40)
Chloride: 103 mmol/L (ref 98–111)
Creatinine, Ser: 1.1 mg/dL — ABNORMAL HIGH (ref 0.44–1.00)
Glucose, Bld: 116 mg/dL — ABNORMAL HIGH (ref 70–99)
HCT: 42 % (ref 36.0–46.0)
Hemoglobin: 14.3 g/dL (ref 12.0–15.0)
Potassium: 3.6 mmol/L (ref 3.5–5.1)
Sodium: 142 mmol/L (ref 135–145)
TCO2: 24 mmol/L (ref 22–32)

## 2023-05-08 LAB — PROTIME-INR
INR: 1 (ref 0.8–1.2)
Prothrombin Time: 13.4 s (ref 11.4–15.2)

## 2023-05-08 LAB — I-STAT CG4 LACTIC ACID, ED: Lactic Acid, Venous: 2.6 mmol/L (ref 0.5–1.9)

## 2023-05-08 LAB — SAMPLE TO BLOOD BANK

## 2023-05-08 LAB — ETHANOL: Alcohol, Ethyl (B): <10 mg/dL (ref <10)

## 2023-05-08 MED ORDER — LORAZEPAM 2 MG/ML IJ SOLN
2.0000 mg | Freq: Once | INTRAMUSCULAR | Status: AC
  Administered 2023-05-08: 2 mg via INTRAVENOUS

## 2023-05-08 MED ORDER — IOHEXOL 350 MG/ML SOLN
75.0000 mL | Freq: Once | INTRAVENOUS | Status: AC | PRN
  Administered 2023-05-08: 75 mL via INTRAVENOUS

## 2023-05-08 MED ORDER — LORAZEPAM 2 MG/ML IJ SOLN
INTRAMUSCULAR | Status: AC
  Filled 2023-05-08: qty 1
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
## 2023-05-08 NOTE — Discharge Instructions (Addendum)
Please follow up with your doctor and return to the ER for worsening symptoms.
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
## 2023-05-09 NOTE — ED Notes (Signed)
Per Dr. Rhae Hammock, okay to bypass labs/pregnancy to obtain CT scans.
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
## 2023-05-09 NOTE — ED Notes (Signed)
Pts mother updated on pt status. Pts mother met this RN at emergency entrance and picked the pt up.
